# Patient Record
Sex: Female | Born: 1950 | Race: White | Hispanic: No | Marital: Married | Smoking: Former smoker
Health system: Southern US, Community
[De-identification: ages and names within clinical notes are randomized; demographics above are authoritative.]

## PROBLEM LIST (undated history)

## (undated) DIAGNOSIS — L299 Pruritus, unspecified: Secondary | ICD-10-CM

## (undated) DIAGNOSIS — T7840XA Allergy, unspecified, initial encounter: Secondary | ICD-10-CM

## (undated) DIAGNOSIS — R7611 Nonspecific reaction to tuberculin skin test without active tuberculosis: Secondary | ICD-10-CM

## (undated) DIAGNOSIS — K635 Polyp of colon: Secondary | ICD-10-CM

## (undated) DIAGNOSIS — K5900 Constipation, unspecified: Secondary | ICD-10-CM

## (undated) DIAGNOSIS — Z0000 Encounter for general adult medical examination without abnormal findings: Secondary | ICD-10-CM

## (undated) DIAGNOSIS — J449 Chronic obstructive pulmonary disease, unspecified: Secondary | ICD-10-CM

## (undated) DIAGNOSIS — A498 Other bacterial infections of unspecified site: Secondary | ICD-10-CM

## (undated) DIAGNOSIS — M8580 Other specified disorders of bone density and structure, unspecified site: Secondary | ICD-10-CM

## (undated) HISTORY — PX: DILATION AND CURETTAGE OF UTERUS: SHX78

## (undated) HISTORY — DX: Pruritus, unspecified: L29.9

## (undated) HISTORY — DX: Allergy, unspecified, initial encounter: T78.40XA

## (undated) HISTORY — DX: Chronic obstructive pulmonary disease, unspecified: J44.9

## (undated) HISTORY — DX: Encounter for general adult medical examination without abnormal findings: Z00.00

## (undated) HISTORY — DX: Constipation, unspecified: K59.00

## (undated) HISTORY — DX: Other specified disorders of bone density and structure, unspecified site: M85.80

## (undated) HISTORY — PX: COLONOSCOPY: SHX174

## (undated) HISTORY — PX: ABDOMINAL HYSTERECTOMY: SHX81

## (undated) HISTORY — PX: TUBAL LIGATION: SHX77

## (undated) HISTORY — DX: Polyp of colon: K63.5

## (undated) HISTORY — PX: APPENDECTOMY: SHX54

## (undated) HISTORY — DX: Nonspecific reaction to tuberculin skin test without active tuberculosis: R76.11

---
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
## 2000-01-12 ENCOUNTER — Other Ambulatory Visit: Admission: RE | Admit: 2000-01-12 | Discharge: 2000-01-12 | Payer: Self-pay | Admitting: Family Medicine

## 2000-03-30 ENCOUNTER — Emergency Department (HOSPITAL_COMMUNITY): Admission: EM | Admit: 2000-03-30 | Discharge: 2000-03-30 | Payer: Self-pay

## 2004-01-15 ENCOUNTER — Ambulatory Visit: Payer: Self-pay | Admitting: Family Medicine

## 2004-05-06 ENCOUNTER — Ambulatory Visit: Payer: Self-pay | Admitting: Family Medicine

## 2004-05-27 ENCOUNTER — Ambulatory Visit: Payer: Self-pay

## 2004-06-04 ENCOUNTER — Ambulatory Visit: Payer: Self-pay | Admitting: Family Medicine

## 2005-01-05 ENCOUNTER — Ambulatory Visit: Payer: Self-pay | Admitting: Internal Medicine

## 2005-01-14 ENCOUNTER — Ambulatory Visit: Payer: Self-pay | Admitting: Internal Medicine

## 2005-08-27 ENCOUNTER — Encounter: Admission: RE | Admit: 2005-08-27 | Discharge: 2005-08-27 | Payer: Self-pay | Admitting: Occupational Medicine

## 2005-08-27 ENCOUNTER — Other Ambulatory Visit: Admission: RE | Admit: 2005-08-27 | Discharge: 2005-08-27 | Payer: Self-pay | Admitting: Internal Medicine

## 2008-06-10 ENCOUNTER — Ambulatory Visit: Payer: Self-pay | Admitting: Internal Medicine

## 2008-06-10 ENCOUNTER — Other Ambulatory Visit: Admission: RE | Admit: 2008-06-10 | Discharge: 2008-06-10 | Payer: Self-pay | Admitting: Internal Medicine

## 2008-06-10 LAB — HM PAP SMEAR

## 2008-07-11 ENCOUNTER — Ambulatory Visit: Payer: Self-pay | Admitting: Internal Medicine

## 2009-06-04 ENCOUNTER — Encounter: Admission: RE | Admit: 2009-06-04 | Discharge: 2009-06-04 | Payer: Self-pay | Admitting: Occupational Medicine

## 2010-01-27 ENCOUNTER — Ambulatory Visit: Payer: Self-pay | Admitting: Internal Medicine

## 2010-01-30 ENCOUNTER — Encounter: Admission: RE | Admit: 2010-01-30 | Discharge: 2010-01-30 | Payer: Self-pay | Admitting: Internal Medicine

## 2010-03-04 LAB — HM MAMMOGRAPHY

## 2010-07-20 ENCOUNTER — Telehealth: Payer: Self-pay

## 2010-07-20 NOTE — Telephone Encounter (Signed)
Patient informed, and will call ortho

## 2010-07-20 NOTE — Telephone Encounter (Signed)
This is an orth problem. She should call ortho office of choice or [facility] [phone]

## 2010-07-20 NOTE — Telephone Encounter (Signed)
Patient would like to be seen today, if possible. Has pain near left achilles tendon since Saturday. No swelling or redness that she can see

## 2010-12-17 ENCOUNTER — Encounter: Payer: Self-pay | Admitting: Internal Medicine

## 2010-12-18 ENCOUNTER — Ambulatory Visit: Payer: Self-pay | Admitting: Internal Medicine

## 2011-01-07 ENCOUNTER — Ambulatory Visit (INDEPENDENT_AMBULATORY_CARE_PROVIDER_SITE_OTHER): Payer: BC Managed Care – PPO | Admitting: Internal Medicine

## 2011-01-07 DIAGNOSIS — Z23 Encounter for immunization: Secondary | ICD-10-CM

## 2011-02-04 ENCOUNTER — Other Ambulatory Visit: Payer: BC Managed Care – PPO | Admitting: Internal Medicine

## 2011-02-04 DIAGNOSIS — Z0000 Encounter for general adult medical examination without abnormal findings: Secondary | ICD-10-CM

## 2011-02-05 ENCOUNTER — Other Ambulatory Visit: Payer: BC Managed Care – PPO | Admitting: Internal Medicine

## 2011-02-05 LAB — CBC WITH DIFFERENTIAL/PLATELET
Basophils Absolute: 0.1 10*3/uL (ref 0.0–0.1)
Basophils Relative: 1 % (ref 0–1)
Eosinophils Absolute: 0.8 10*3/uL — ABNORMAL HIGH (ref 0.0–0.7)
Eosinophils Relative: 7 % — ABNORMAL HIGH (ref 0–5)
HCT: 39.5 % (ref 36.0–46.0)
Hemoglobin: 12.2 g/dL (ref 12.0–15.0)
Lymphocytes Relative: 18 % (ref 12–46)
Lymphs Abs: 1.9 10*3/uL (ref 0.7–4.0)
MCH: 24.6 pg — ABNORMAL LOW (ref 26.0–34.0)
MCHC: 30.9 g/dL (ref 30.0–36.0)
MCV: 79.8 fL (ref 78.0–100.0)
Monocytes Absolute: 0.8 10*3/uL (ref 0.1–1.0)
Monocytes Relative: 8 % (ref 3–12)
Neutro Abs: 7.1 10*3/uL (ref 1.7–7.7)
Neutrophils Relative %: 67 % (ref 43–77)
Platelets: 279 10*3/uL (ref 150–400)
RBC: 4.95 MIL/uL (ref 3.87–5.11)
RDW: 18.9 % — ABNORMAL HIGH (ref 11.5–15.5)
WBC: 10.6 10*3/uL — ABNORMAL HIGH (ref 4.0–10.5)

## 2011-02-05 LAB — LIPID PANEL
Cholesterol: 216 mg/dL — ABNORMAL HIGH (ref 0–200)
HDL: 67 mg/dL (ref >39)
LDL Cholesterol: 130 mg/dL — ABNORMAL HIGH (ref 0–99)
Total CHOL/HDL Ratio: 3.2 Ratio
Triglycerides: 95 mg/dL (ref <150)
VLDL: 19 mg/dL (ref 0–40)

## 2011-02-05 LAB — COMPREHENSIVE METABOLIC PANEL
ALT: 16 U/L (ref 0–35)
AST: 18 U/L (ref 0–37)
Albumin: 4.2 g/dL (ref 3.5–5.2)
Alkaline Phosphatase: 90 U/L (ref 39–117)
BUN: 14 mg/dL (ref 6–23)
CO2: 28 mEq/L (ref 19–32)
Calcium: 9.3 mg/dL (ref 8.4–10.5)
Chloride: 103 mEq/L (ref 96–112)
Creat: 0.86 mg/dL (ref 0.50–1.10)
Glucose, Bld: 131 mg/dL — ABNORMAL HIGH (ref 70–99)
Potassium: 4.1 mEq/L (ref 3.5–5.3)
Sodium: 138 mEq/L (ref 135–145)
Total Bilirubin: 0.4 mg/dL (ref 0.3–1.2)
Total Protein: 6.9 g/dL (ref 6.0–8.3)

## 2011-02-05 LAB — VITAMIN D 25 HYDROXY (VIT D DEFICIENCY, FRACTURES): Vit D, 25-Hydroxy: 18 ng/mL — ABNORMAL LOW (ref 30–89)

## 2011-02-05 LAB — TSH: TSH: 1.82 u[IU]/mL (ref 0.350–4.500)

## 2011-02-08 ENCOUNTER — Encounter: Payer: Self-pay | Admitting: Internal Medicine

## 2011-02-08 ENCOUNTER — Ambulatory Visit (INDEPENDENT_AMBULATORY_CARE_PROVIDER_SITE_OTHER): Payer: BC Managed Care – PPO | Admitting: Internal Medicine

## 2011-02-08 VITALS — BP 100/68 | HR 88 | Temp 97.5°F | Ht 61.0 in | Wt 114.0 lb

## 2011-02-08 DIAGNOSIS — Z87891 Personal history of nicotine dependence: Secondary | ICD-10-CM

## 2011-02-08 DIAGNOSIS — Z0000 Encounter for general adult medical examination without abnormal findings: Secondary | ICD-10-CM

## 2011-02-08 DIAGNOSIS — K635 Polyp of colon: Secondary | ICD-10-CM

## 2011-02-08 DIAGNOSIS — M7918 Myalgia, other site: Secondary | ICD-10-CM

## 2011-02-08 DIAGNOSIS — M8580 Other specified disorders of bone density and structure, unspecified site: Secondary | ICD-10-CM

## 2011-02-08 LAB — POCT URINALYSIS DIPSTICK
Bilirubin, UA: NEGATIVE
Blood, UA: NEGATIVE
Glucose, UA: NEGATIVE
Ketones, UA: NEGATIVE
Leukocytes, UA: NEGATIVE
Nitrite, UA: NEGATIVE
Protein, UA: NEGATIVE
Spec Grav, UA: 1.01
Urobilinogen, UA: NEGATIVE
pH, UA: 6

## 2011-02-11 ENCOUNTER — Other Ambulatory Visit: Payer: Self-pay | Admitting: Internal Medicine

## 2011-02-11 NOTE — Telephone Encounter (Signed)
Call drugstore and refill for 0ne year.

## 2011-02-15 ENCOUNTER — Other Ambulatory Visit: Payer: Self-pay

## 2011-02-15 MED ORDER — KETOPROFEN 75 MG PO CAPS: 75.0000 mg | ORAL_CAPSULE | Freq: Two times a day (BID) | ORAL | Status: DC

## 2011-03-01 ENCOUNTER — Encounter: Payer: Self-pay | Admitting: Internal Medicine

## 2011-03-01 DIAGNOSIS — M7918 Myalgia, other site: Secondary | ICD-10-CM | POA: Insufficient documentation

## 2011-03-01 DIAGNOSIS — K635 Polyp of colon: Secondary | ICD-10-CM | POA: Insufficient documentation

## 2011-03-01 DIAGNOSIS — M8580 Other specified disorders of bone density and structure, unspecified site: Secondary | ICD-10-CM | POA: Insufficient documentation

## 2011-03-01 DIAGNOSIS — Z87891 Personal history of nicotine dependence: Secondary | ICD-10-CM | POA: Insufficient documentation

## 2011-03-01 NOTE — Patient Instructions (Addendum)
Please consider stopping smoking. Return one year or as needed.

## 2011-04-25 ENCOUNTER — Encounter: Payer: Self-pay | Admitting: Internal Medicine

## 2011-04-25 NOTE — Progress Notes (Signed)
  Subjective:    Patient ID: Danielle Fox, female    DOB: 16-Sep-1950, 61 y.o.   MRN: [id number]  HPI 61 year old white female with history of hyperplastic colon polyps and osteopenia with history of smoking or health maintenance exam. Patient had a fractured left ankle April 2011, left hand laceration from a knife 2003. Had mammogram January 2012. No known drug allergies. Had D&C twice once after her first son was born and after her second child was born. Also had left ruptured ovarian cyst and appendectomy 1973. Hysterectomy 1999. Tetanus immunization last done 2003. Bone density study done May 2010. Colonoscopy by Dr. Leone Payor November 2006. Negative Cardiolite study March 2006. Last Pap smear 2010. Patient took estrogen replacement in the form of Climara patch from 2005 until recently. Patient works as a Pharmacologist. She divorced in 1996. Has smoked a pack of cigarettes daily for proximally 40 years but quit for about 3 years. Social alcohol consumption consisting of beer. Resides with female friend Faye Ramsay.  Family history both parents had heart attacks but father died of complications of psoriasis at age 87.  One brother and 4 sisters in good health. 2 sons living. One son died at age 56 in [place] in a motor vehicle accident in 29.    Review of Systems  Constitutional: Negative.   HENT: Negative.   Eyes: Negative.   Respiratory: Negative.   Cardiovascular: Negative.   Gastrointestinal: Negative.   Genitourinary: Negative.   Musculoskeletal: Negative.   Neurological: Negative.   Hematological: Negative.   Psychiatric/Behavioral: Negative.        Objective:   Physical Exam  Nursing note and vitals reviewed. Constitutional: She is oriented to person, place, and time. She appears well-nourished.  HENT:  Head: Normocephalic and atraumatic.  Right Ear: External ear normal.  Left Ear: External ear normal.  Mouth/Throat: Oropharynx is clear and moist.  Eyes:  Conjunctivae and EOM are normal. Right eye exhibits no discharge. Left eye exhibits no discharge. No scleral icterus.  Neck: Normal range of motion. Neck supple. No JVD present. No thyromegaly present.  Cardiovascular: Normal rate, regular rhythm, normal heart sounds and intact distal pulses.   No murmur heard. Pulmonary/Chest: Effort normal and breath sounds normal. No respiratory distress. She has no wheezes. She has no rales.       Breasts normal female without masses  Abdominal: Soft. Bowel sounds are normal. She exhibits no mass. There is no tenderness. There is no rebound.  Genitourinary:       Deferred last Pap 2010  Musculoskeletal: Normal range of motion. She exhibits no edema.  Lymphadenopathy:    She has no cervical adenopathy.  Neurological: She is alert and oriented to person, place, and time. She has normal reflexes. She displays normal reflexes. Coordination normal.  Skin: Skin is warm and dry. No rash noted. She is not diaphoretic.  Psychiatric: She has a normal mood and affect. Her behavior is normal. Judgment and thought content normal.          Assessment & Plan:  History of smoking-chest x-ray done 01/30/2010 negative except for mild hyperinflation.  History of hyperplastic colon polyps  History of osteopenia took Boniva short-term around 2007. Last bone density study 2010 showed T score LS-spine -1.2 at L1. Right femoral neck -1.4.  Plan: Continue calcium and vitamin D supplementation. Patient takes  Orudis 75 mg twice daily as needed for minor pain. Return one year or as needed.

## 2011-06-21 ENCOUNTER — Telehealth: Payer: Self-pay

## 2011-06-21 NOTE — Telephone Encounter (Signed)
Phoned in Phenergan 25 mg tablets 1 q8h prn nausea #30 with no refills to [phone] per Dr. Lenord Fellers.  Pt aware.

## 2011-08-12 ENCOUNTER — Other Ambulatory Visit: Payer: BC Managed Care – PPO | Admitting: Internal Medicine

## 2011-08-12 DIAGNOSIS — E785 Hyperlipidemia, unspecified: Secondary | ICD-10-CM

## 2011-08-12 LAB — LIPID PANEL
Cholesterol: 194 mg/dL (ref 0–200)
HDL: 61 mg/dL (ref >39)
LDL Cholesterol: 116 mg/dL — ABNORMAL HIGH (ref 0–99)
Total CHOL/HDL Ratio: 3.2 Ratio
Triglycerides: 85 mg/dL (ref <150)
VLDL: 17 mg/dL (ref 0–40)

## 2011-08-13 ENCOUNTER — Encounter: Payer: Self-pay | Admitting: Internal Medicine

## 2011-08-13 ENCOUNTER — Ambulatory Visit (INDEPENDENT_AMBULATORY_CARE_PROVIDER_SITE_OTHER): Payer: BC Managed Care – PPO | Admitting: Internal Medicine

## 2011-08-13 VITALS — BP 102/74 | HR 92 | Temp 97.4°F | Ht 61.0 in | Wt 116.0 lb

## 2011-08-13 DIAGNOSIS — M899 Disorder of bone, unspecified: Secondary | ICD-10-CM

## 2011-08-13 DIAGNOSIS — Z8639 Personal history of other endocrine, nutritional and metabolic disease: Secondary | ICD-10-CM

## 2011-08-13 DIAGNOSIS — M8580 Other specified disorders of bone density and structure, unspecified site: Secondary | ICD-10-CM

## 2011-08-13 DIAGNOSIS — Z87891 Personal history of nicotine dependence: Secondary | ICD-10-CM

## 2011-08-13 DIAGNOSIS — M949 Disorder of cartilage, unspecified: Secondary | ICD-10-CM

## 2011-08-13 DIAGNOSIS — Z87898 Personal history of other specified conditions: Secondary | ICD-10-CM

## 2011-08-13 DIAGNOSIS — E785 Hyperlipidemia, unspecified: Secondary | ICD-10-CM

## 2011-08-15 ENCOUNTER — Encounter: Payer: Self-pay | Admitting: Internal Medicine

## 2011-08-15 DIAGNOSIS — E785 Hyperlipidemia, unspecified: Secondary | ICD-10-CM | POA: Insufficient documentation

## 2011-08-15 DIAGNOSIS — Z8639 Personal history of other endocrine, nutritional and metabolic disease: Secondary | ICD-10-CM | POA: Insufficient documentation

## 2011-08-15 NOTE — Progress Notes (Signed)
  Subjective:    Patient ID: Danielle Fox, female    DOB: 10-07-1950, 61 y.o.   MRN: [id number]  HPI 61 year old white female with history of osteopenia, history of smoking, history of hyperplastic colon polyp, history of musculoskeletal pain treated with ketoprofen with followup today on hyperlipidemia noted on physical exam 6 months ago. At that time total cholesterol was 216 with an LDL cholesterol of 1:30 which was higher than it had been previously. Her previous lipid panel in November 2011 had shown a total cholesterol of 188 with LDL cholesterol of 106. Her thyroid functions were normal. She also has a history of mild vitamin D deficiency. Today total cholesterol is 194 with an LDL cholesterol of 116. Triglycerides are normal. She continues to smoke. Is smoking less than a pack of cigarettes per day. Not ready to quit. Prescription given for Zostavax vaccine to be done at drug store. Order given to her to have mammogram. Last mammogram on file January 2012. Last bone density study May 2010 says that should be repeated as well. Says she is eating too much cheese. Explained which  foods that could increase her lipid results    Review of Systems     Objective:   Physical Exam neck supple without JVD thyromegaly or carotid bruits; chest clear to auscultation; cardiac exam regular rate and rhythm; extremities without edema. Patient is alert and oriented x3.        Assessment & Plan:  History of smoking  Hyperlipidemia  Osteopenia  History of vitamin D deficiency  Plan: Patient given order to have mammogram and bone density studies; return in 6 months for physical examination. Continue diet exercise to treat hyperlipidemia. Says she is eating too much cheese. Please give consideration to stopping smoking.

## 2011-08-15 NOTE — Patient Instructions (Addendum)
Patient is not ready to quit smoking. Should obtain bone density study and mammogram in the near future. Return in 6 months for physical examination. Continue to control lipids with diet and exercise. Stop eating lots of cheese.

## 2012-02-10 ENCOUNTER — Other Ambulatory Visit: Payer: BC Managed Care – PPO | Admitting: Internal Medicine

## 2012-02-11 ENCOUNTER — Encounter: Payer: BC Managed Care – PPO | Admitting: Internal Medicine

## 2012-02-15 ENCOUNTER — Telehealth: Payer: Self-pay | Admitting: Internal Medicine

## 2012-02-15 NOTE — Telephone Encounter (Signed)
No records in paper chart about any orthopedic MD she thinks she's been to. Given phone number for [facility]

## 2012-02-15 NOTE — Telephone Encounter (Signed)
Pt wanted to know what was the name of the orthopedic doctor Dr. Lenord Fellers referred her to about five years ago. She would like to see them.

## 2012-02-15 NOTE — Telephone Encounter (Signed)
Please pull chart and see if we have records from previous ortho visit.

## 2012-03-14 ENCOUNTER — Other Ambulatory Visit: Payer: Self-pay | Admitting: Internal Medicine

## 2012-03-16 ENCOUNTER — Ambulatory Visit: Payer: BC Managed Care – PPO | Admitting: Rehabilitation

## 2012-04-10 ENCOUNTER — Other Ambulatory Visit: Payer: PRIVATE HEALTH INSURANCE | Admitting: Internal Medicine

## 2012-04-10 DIAGNOSIS — Z0000 Encounter for general adult medical examination without abnormal findings: Secondary | ICD-10-CM

## 2012-04-10 LAB — COMPREHENSIVE METABOLIC PANEL
ALT: 16 U/L (ref 0–35)
AST: 18 U/L (ref 0–37)
Albumin: 4.3 g/dL (ref 3.5–5.2)
Alkaline Phosphatase: 90 U/L (ref 39–117)
BUN: 11 mg/dL (ref 6–23)
CO2: 27 mEq/L (ref 19–32)
Calcium: 9.8 mg/dL (ref 8.4–10.5)
Chloride: 104 mEq/L (ref 96–112)
Creat: 0.91 mg/dL (ref 0.50–1.10)
Glucose, Bld: 89 mg/dL (ref 70–99)
Potassium: 4.3 mEq/L (ref 3.5–5.3)
Sodium: 140 mEq/L (ref 135–145)
Total Bilirubin: 0.8 mg/dL (ref 0.3–1.2)
Total Protein: 7 g/dL (ref 6.0–8.3)

## 2012-04-10 LAB — LIPID PANEL
Cholesterol: 222 mg/dL — ABNORMAL HIGH (ref 0–200)
HDL: 70 mg/dL (ref >39)
LDL Cholesterol: 134 mg/dL — ABNORMAL HIGH (ref 0–99)
Total CHOL/HDL Ratio: 3.2 Ratio
Triglycerides: 91 mg/dL (ref <150)
VLDL: 18 mg/dL (ref 0–40)

## 2012-04-10 LAB — CBC WITH DIFFERENTIAL/PLATELET
Basophils Absolute: 0.1 10*3/uL (ref 0.0–0.1)
Basophils Relative: 1 % (ref 0–1)
Eosinophils Absolute: 0.7 10*3/uL (ref 0.0–0.7)
Eosinophils Relative: 9 % — ABNORMAL HIGH (ref 0–5)
HCT: 42.8 % (ref 36.0–46.0)
Hemoglobin: 14.3 g/dL (ref 12.0–15.0)
Lymphocytes Relative: 33 % (ref 12–46)
Lymphs Abs: 2.5 10*3/uL (ref 0.7–4.0)
MCH: 28.2 pg (ref 26.0–34.0)
MCHC: 33.4 g/dL (ref 30.0–36.0)
MCV: 84.4 fL (ref 78.0–100.0)
Monocytes Absolute: 1 10*3/uL (ref 0.1–1.0)
Monocytes Relative: 13 % — ABNORMAL HIGH (ref 3–12)
Neutro Abs: 3.5 10*3/uL (ref 1.7–7.7)
Neutrophils Relative %: 44 % (ref 43–77)
Platelets: 270 10*3/uL (ref 150–400)
RBC: 5.07 MIL/uL (ref 3.87–5.11)
RDW: 15.2 % (ref 11.5–15.5)
WBC: 7.7 10*3/uL (ref 4.0–10.5)

## 2012-04-10 LAB — TSH: TSH: 2.561 u[IU]/mL (ref 0.350–4.500)

## 2012-04-11 ENCOUNTER — Other Ambulatory Visit: Payer: BC Managed Care – PPO | Admitting: Internal Medicine

## 2012-04-11 LAB — VITAMIN D 25 HYDROXY (VIT D DEFICIENCY, FRACTURES): Vit D, 25-Hydroxy: 11 ng/mL — ABNORMAL LOW (ref 30–89)

## 2012-04-13 ENCOUNTER — Encounter: Payer: BC Managed Care – PPO | Admitting: Internal Medicine

## 2012-05-16 ENCOUNTER — Other Ambulatory Visit: Payer: Self-pay

## 2012-05-16 MED ORDER — KETOPROFEN 75 MG PO CAPS: 75.0000 mg | ORAL_CAPSULE | Freq: Two times a day (BID) | ORAL | Status: DC | PRN

## 2012-07-11 ENCOUNTER — Encounter: Payer: PRIVATE HEALTH INSURANCE | Admitting: Internal Medicine

## 2012-08-24 ENCOUNTER — Encounter: Payer: Self-pay | Admitting: Internal Medicine

## 2012-09-18 ENCOUNTER — Encounter: Payer: PRIVATE HEALTH INSURANCE | Admitting: Internal Medicine

## 2012-12-12 ENCOUNTER — Telehealth: Payer: Self-pay

## 2012-12-12 NOTE — Telephone Encounter (Addendum)
New Patient - Needs updates Unable to reach prior to visit

## 2012-12-14 ENCOUNTER — Encounter: Payer: Self-pay | Admitting: Family Medicine

## 2012-12-14 ENCOUNTER — Ambulatory Visit (INDEPENDENT_AMBULATORY_CARE_PROVIDER_SITE_OTHER): Payer: PRIVATE HEALTH INSURANCE | Admitting: Family Medicine

## 2012-12-14 VITALS — BP 102/66 | HR 76 | Temp 98.1°F | Ht 62.0 in | Wt 118.4 lb

## 2012-12-14 DIAGNOSIS — L259 Unspecified contact dermatitis, unspecified cause: Secondary | ICD-10-CM

## 2012-12-14 DIAGNOSIS — E785 Hyperlipidemia, unspecified: Secondary | ICD-10-CM

## 2012-12-14 DIAGNOSIS — Z1239 Encounter for other screening for malignant neoplasm of breast: Secondary | ICD-10-CM

## 2012-12-14 DIAGNOSIS — M8580 Other specified disorders of bone density and structure, unspecified site: Secondary | ICD-10-CM

## 2012-12-14 DIAGNOSIS — Z0000 Encounter for general adult medical examination without abnormal findings: Secondary | ICD-10-CM

## 2012-12-14 DIAGNOSIS — Z23 Encounter for immunization: Secondary | ICD-10-CM

## 2012-12-14 DIAGNOSIS — E2839 Other primary ovarian failure: Secondary | ICD-10-CM

## 2012-12-14 DIAGNOSIS — E559 Vitamin D deficiency, unspecified: Secondary | ICD-10-CM

## 2012-12-14 DIAGNOSIS — Z8639 Personal history of other endocrine, nutritional and metabolic disease: Secondary | ICD-10-CM

## 2012-12-14 DIAGNOSIS — N390 Urinary tract infection, site not specified: Secondary | ICD-10-CM

## 2012-12-14 DIAGNOSIS — Z87891 Personal history of nicotine dependence: Secondary | ICD-10-CM

## 2012-12-14 DIAGNOSIS — M899 Disorder of bone, unspecified: Secondary | ICD-10-CM

## 2012-12-14 LAB — LIPID PANEL
Cholesterol: 246 mg/dL — ABNORMAL HIGH (ref 0–200)
HDL: 65.2 mg/dL (ref >39.00)
Total CHOL/HDL Ratio: 4
Triglycerides: 125 mg/dL (ref 0.0–149.0)
VLDL: 25 mg/dL (ref 0.0–40.0)

## 2012-12-14 LAB — POCT URINALYSIS DIPSTICK
Bilirubin, UA: NEGATIVE
Blood, UA: NEGATIVE
Glucose, UA: NEGATIVE
Ketones, UA: NEGATIVE
Nitrite, UA: NEGATIVE
Protein, UA: NEGATIVE
Spec Grav, UA: <=1.005
Urobilinogen, UA: 0.2
pH, UA: 6.5

## 2012-12-14 LAB — BASIC METABOLIC PANEL
BUN: 11 mg/dL (ref 6–23)
CO2: 29 mEq/L (ref 19–32)
Calcium: 9.9 mg/dL (ref 8.4–10.5)
Chloride: 101 mEq/L (ref 96–112)
Creatinine, Ser: 0.9 mg/dL (ref 0.4–1.2)
GFR: 68.21 mL/min (ref >60.00)
Glucose, Bld: 84 mg/dL (ref 70–99)
Potassium: 3.7 mEq/L (ref 3.5–5.1)
Sodium: 140 mEq/L (ref 135–145)

## 2012-12-14 LAB — HEPATIC FUNCTION PANEL
ALT: 22 U/L (ref 0–35)
AST: 27 U/L (ref 0–37)
Albumin: 4.4 g/dL (ref 3.5–5.2)
Alkaline Phosphatase: 86 U/L (ref 39–117)
Bilirubin, Direct: 0 mg/dL (ref 0.0–0.3)
Total Bilirubin: 0.8 mg/dL (ref 0.3–1.2)
Total Protein: 7.9 g/dL (ref 6.0–8.3)

## 2012-12-14 LAB — TSH: TSH: 1.38 u[IU]/mL (ref 0.35–5.50)

## 2012-12-14 LAB — CBC WITH DIFFERENTIAL/PLATELET
Basophils Absolute: 0.1 10*3/uL (ref 0.0–0.1)
Basophils Relative: 0.9 % (ref 0.0–3.0)
Eosinophils Absolute: 0.9 10*3/uL — ABNORMAL HIGH (ref 0.0–0.7)
Eosinophils Relative: 11.1 % — ABNORMAL HIGH (ref 0.0–5.0)
HCT: 43 % (ref 36.0–46.0)
Hemoglobin: 14.3 g/dL (ref 12.0–15.0)
Lymphocytes Relative: 23.7 % (ref 12.0–46.0)
Lymphs Abs: 2 10*3/uL (ref 0.7–4.0)
MCHC: 33.4 g/dL (ref 30.0–36.0)
MCV: 89.4 fl (ref 78.0–100.0)
Monocytes Absolute: 0.6 10*3/uL (ref 0.1–1.0)
Monocytes Relative: 7.7 % (ref 3.0–12.0)
Neutro Abs: 4.8 10*3/uL (ref 1.4–7.7)
Neutrophils Relative %: 56.6 % (ref 43.0–77.0)
Platelets: 233 10*3/uL (ref 150.0–400.0)
RBC: 4.8 Mil/uL (ref 3.87–5.11)
RDW: 14.2 % (ref 11.5–14.6)
WBC: 8.4 10*3/uL (ref 4.5–10.5)

## 2012-12-14 LAB — LDL CHOLESTEROL, DIRECT: Direct LDL: 155.9 mg/dL

## 2012-12-14 MED ORDER — KETOPROFEN 75 MG PO CAPS: 75.0000 mg | ORAL_CAPSULE | Freq: Two times a day (BID) | ORAL | Status: DC | PRN

## 2012-12-14 MED ORDER — MOMETASONE FUROATE 0.1 % EX CREA: TOPICAL_CREAM | Freq: Every day | CUTANEOUS | Status: DC

## 2012-12-14 NOTE — Addendum Note (Signed)
Addended by: Silvio Pate D on: 12/14/2012 04:49 PM   Modules accepted: Orders

## 2012-12-14 NOTE — Progress Notes (Signed)
Subjective:     Christian ZILDA NO is a 62 y.o. female and is here for a comprehensive physical exam. The patient reports no problems.  History   Social History  . Marital Status: Single    Spouse Name: N/A    Number of Children: N/A  . 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 Smoking status: Current Every Day Smoker -- 0.50 packs/day for 40 years    Types: Cigarettes  . Smokeless tobacco: Never Used  . Alcohol Use: Yes     Comment: socially  . Drug Use: No  . Sexual Activity: Yes    Partners: Male   Other Topics Concern  . Not on file   Social History Narrative   Exercise-- walks dog on occassion   Health Maintenance  Topic Date Due  . Zostavax  07/07/2010  . Pap Smear  06/11/2011  . Mammogram  03/04/2012  . Influenza Vaccine  09/29/2012  . Tetanus/tdap  09/16/2013  . Colonoscopy  08/14/2021    The following portions of the patient's history were reviewed and updated as appropriate:  She  has a past medical history of Osteopenia; Colon polyps; and Positive TB test. She  does not have any pertinent problems on file. She  has past surgical history that includes Appendectomy and Abdominal hysterectomy. Her family history includes Alcohol abuse in her father; Cirrhosis in her father; Heart disease in her father and mother. She  reports that she has been smoking Cigarettes.  She has a 20 pack-year smoking history. She has never used smokeless tobacco. She reports that she drinks alcohol. She reports that she does not use illicit drugs. She has a current medication list which includes the following prescription(s): ketoprofen and mometasone. No current outpatient prescriptions on file prior to visit.   No current facility-administered medications on file prior to visit.   She has No Known Allergies..  Review of Systems Review of Systems  Constitutional: Negative for activity change, appetite change and  fatigue.  HENT: Negative for hearing loss, congestion, tinnitus and ear discharge.  dentist q80m Eyes: Negative for visual disturbance (see optho q1y -- vision corrected to 20/20 with glasses).  Respiratory: Negative for cough, chest tightness and shortness of breath.   Cardiovascular: Negative for chest pain, palpitations and leg swelling.  Gastrointestinal: Negative for abdominal pain, diarrhea, constipation and abdominal distention.  Genitourinary: Negative for urgency, frequency, decreased urine volume and difficulty urinating.  Musculoskeletal: Negative for back pain, arthralgias and gait problem.  Skin: Negative for color change, pallor and rash.  Neurological: Negative for dizziness, light-headedness, numbness and headaches.  Hematological: Negative for adenopathy. Does not bruise/bleed easily.  Psychiatric/Behavioral: Negative for suicidal ideas, confusion, sleep disturbance, self-injury, dysphoric mood, decreased concentration and agitation.       Objective:    BP 102/66  Pulse 76  Temp(Src) 98.1 F (36.7 C) (Oral)  Ht 5\' 2"  (1.575 m)  Wt 118 lb 6.4 oz (53.706 kg)  BMI 21.65 kg/m2  SpO2 96% General appearance: alert, cooperative, appears stated age and no distress Head: Normocephalic, without obvious abnormality, atraumatic Eyes: conjunctivae/corneas clear. PERRL, EOM's intact. Fundi benign. Ears: normal TM's and external ear canals both ears Nose: Nares normal. Septum midline. Mucosa normal. No drainage or sinus tenderness. Throat: lips, mucosa, and tongue normal; teeth and gums normal Neck: no adenopathy, no carotid bruit, no JVD, supple, symmetrical, trachea midline and thyroid not enlarged, symmetric, no tenderness/mass/nodules Back:  symmetric, no curvature. ROM normal. No CVA tenderness. Lungs: clear to auscultation bilaterally Breasts: normal appearance, no masses or tenderness Heart: regular rate and rhythm, S1, S2 normal, no murmur, click, rub or gallop Abdomen:  soft, non-tender; bowel sounds normal; no masses,  no organomegaly Pelvic: not indicated; post-menopausal, no abnormal Pap smears in past Extremities: extremities normal, atraumatic, no cyanosis or edema Pulses: 2+ and symmetric Skin: Skin color, texture, turgor normal. No rashes or lesions Lymph nodes: Cervical, supraclavicular, and axillary nodes normal. Neurologic: Alert and oriented X 3, normal strength and tone. Normal symmetric reflexes. Normal coordination and gait Psych-- no depression, no anxiety      Assessment:    Healthy female exam.      Plan:    ghm utd Check labs See After Visit Summary for Counseling Recommendations

## 2012-12-14 NOTE — Assessment & Plan Note (Signed)
Take vita D 3 2000u daily

## 2012-12-14 NOTE — Assessment & Plan Note (Signed)
Pt not interested in quitting yet

## 2012-12-14 NOTE — Patient Instructions (Signed)
Preventive Care for Adults, Female A healthy lifestyle and preventive care can promote health and wellness. Preventive health guidelines for women include the following key practices.  A routine yearly physical is a good way to check with your caregiver about your health and preventive screening. It is a chance to share any concerns and updates on your health, and to receive a thorough exam.  Visit your dentist for a routine exam and preventive care every 6 months. Brush your teeth twice a day and floss once a day. Good oral hygiene prevents tooth decay and gum disease.  The frequency of eye exams is based on your age, health, family medical history, use of contact lenses, and other factors. Follow your caregiver's recommendations for frequency of eye exams.  Eat a healthy diet. Foods like vegetables, fruits, whole grains, low-fat dairy products, and lean protein foods contain the nutrients you need without too many calories. Decrease your intake of foods high in solid fats, added sugars, and salt. Eat the right amount of calories for you.Get information about a proper diet from your caregiver, if necessary.  Regular physical exercise is one of the most important things you can do for your health. Most adults should get at least 150 minutes of moderate-intensity exercise (any activity that increases your heart rate and causes you to sweat) each week. In addition, most adults need muscle-strengthening exercises on 2 or more days a week.  Maintain a healthy weight. The body mass index (BMI) is a screening tool to identify possible weight problems. It provides an estimate of body fat based on height and weight. Your caregiver can help determine your BMI, and can help you achieve or maintain a healthy weight.For adults 20 years and older:  A BMI below 18.5 is considered underweight.  A BMI of 18.5 to 24.9 is normal.  A BMI of 25 to 29.9 is considered overweight.  A BMI of 30 and above is  considered obese.  Maintain normal blood lipids and cholesterol levels by exercising and minimizing your intake of saturated fat. Eat a balanced diet with plenty of fruit and vegetables. Blood tests for lipids and cholesterol should begin at age 20 and be repeated every 5 years. If your lipid or cholesterol levels are high, you are over 50, or you are at high risk for heart disease, you may need your cholesterol levels checked more frequently.Ongoing high lipid and cholesterol levels should be treated with medicines if diet and exercise are not effective.  If you smoke, find out from your caregiver how to quit. If you do not use tobacco, do not start.  If you are pregnant, do not drink alcohol. If you are breastfeeding, be very cautious about drinking alcohol. If you are not pregnant and choose to drink alcohol, do not exceed 1 drink per day. One drink is considered to be 12 ounces (355 mL) of beer, 5 ounces (148 mL) of wine, or 1.5 ounces (44 mL) of liquor.  Avoid use of street drugs. Do not share needles with anyone. Ask for help if you need support or instructions about stopping the use of drugs.  High blood pressure causes heart disease and increases the risk of stroke. Your blood pressure should be checked at least every 1 to 2 years. Ongoing high blood pressure should be treated with medicines if weight loss and exercise are not effective.  If you are 55 to 62 years old, ask your caregiver if you should take aspirin to prevent strokes.  Diabetes   screening involves taking a blood sample to check your fasting blood sugar level. This should be done once every 3 years, after age 45, if you are within normal weight and without risk factors for diabetes. Testing should be considered at a younger age or be carried out more frequently if you are overweight and have at least 1 risk factor for diabetes.  Breast cancer screening is essential preventive care for women. You should practice "breast  self-awareness." This means understanding the normal appearance and feel of your breasts and may include breast self-examination. Any changes detected, no matter how small, should be reported to a caregiver. Women in their 20s and 30s should have a clinical breast exam (CBE) by a caregiver as part of a regular health exam every 1 to 3 years. After age 40, women should have a CBE every year. Starting at age 40, women should consider having a mammography (breast X-ray test) every year. Women who have a family history of breast cancer should talk to their caregiver about genetic screening. Women at a high risk of breast cancer should talk to their caregivers about having magnetic resonance imaging (MRI) and a mammography every year.  The Pap test is a screening test for cervical cancer. A Pap test can show cell changes on the cervix that might become cervical cancer if left untreated. A Pap test is a procedure in which cells are obtained and examined from the lower end of the uterus (cervix).  Women should have a Pap test starting at age 21.  Between ages 21 and 29, Pap tests should be repeated every 2 years.  Beginning at age 30, you should have a Pap test every 3 years as long as the past 3 Pap tests have been normal.  Some women have medical problems that increase the chance of getting cervical cancer. Talk to your caregiver about these problems. It is especially important to talk to your caregiver if a new problem develops soon after your last Pap test. In these cases, your caregiver may recommend more frequent screening and Pap tests.  The above recommendations are the same for women who have or have not gotten the vaccine for human papillomavirus (HPV).  If you had a hysterectomy for a problem that was not cancer or a condition that could lead to cancer, then you no longer need Pap tests. Even if you no longer need a Pap test, a regular exam is a good idea to make sure no other problems are  starting.  If you are between ages 65 and 70, and you have had normal Pap tests going back 10 years, you no longer need Pap tests. Even if you no longer need a Pap test, a regular exam is a good idea to make sure no other problems are starting.  If you have had past treatment for cervical cancer or a condition that could lead to cancer, you need Pap tests and screening for cancer for at least 20 years after your treatment.  If Pap tests have been discontinued, risk factors (such as a new sexual partner) need to be reassessed to determine if screening should be resumed.  The HPV test is an additional test that may be used for cervical cancer screening. The HPV test looks for the virus that can cause the cell changes on the cervix. The cells collected during the Pap test can be tested for HPV. The HPV test could be used to screen women aged 30 years and older, and should   be used in women of any age who have unclear Pap test results. After the age of 30, women should have HPV testing at the same frequency as a Pap test.  Colorectal cancer can be detected and often prevented. Most routine colorectal cancer screening begins at the age of 50 and continues through age 75. However, your caregiver may recommend screening at an earlier age if you have risk factors for colon cancer. On a yearly basis, your caregiver may provide home test kits to check for hidden blood in the stool. Use of a small camera at the end of a tube, to directly examine the colon (sigmoidoscopy or colonoscopy), can detect the earliest forms of colorectal cancer. Talk to your caregiver about this at age 50, when routine screening begins. Direct examination of the colon should be repeated every 5 to 10 years through age 75, unless early forms of pre-cancerous polyps or small growths are found.  Hepatitis C blood testing is recommended for all people born from 1945 through 1965 and any individual with known risks for hepatitis C.  Practice  safe sex. Use condoms and avoid high-risk sexual practices to reduce the spread of sexually transmitted infections (STIs). STIs include gonorrhea, chlamydia, syphilis, trichomonas, herpes, HPV, and human immunodeficiency virus (HIV). Herpes, HIV, and HPV are viral illnesses that have no cure. They can result in disability, cancer, and death. Sexually active women aged 25 and younger should be checked for chlamydia. Older women with new or multiple partners should also be tested for chlamydia. Testing for other STIs is recommended if you are sexually active and at increased risk.  Osteoporosis is a disease in which the bones lose minerals and strength with aging. This can result in serious bone fractures. The risk of osteoporosis can be identified using a bone density scan. Women ages 65 and over and women at risk for fractures or osteoporosis should discuss screening with their caregivers. Ask your caregiver whether you should take a calcium supplement or vitamin D to reduce the rate of osteoporosis.  Menopause can be associated with physical symptoms and risks. Hormone replacement therapy is available to decrease symptoms and risks. You should talk to your caregiver about whether hormone replacement therapy is right for you.  Use sunscreen with sun protection factor (SPF) of 30 or more. Apply sunscreen liberally and repeatedly throughout the day. You should seek shade when your shadow is shorter than you. Protect yourself by wearing long sleeves, pants, a wide-brimmed hat, and sunglasses year round, whenever you are outdoors.  Once a month, do a whole body skin exam, using a mirror to look at the skin on your back. Notify your caregiver of new moles, moles that have irregular borders, moles that are larger than a pencil eraser, or moles that have changed in shape or color.  Stay current with required immunizations.  Influenza. You need a dose every fall (or winter). The composition of the flu vaccine  changes each year, so being vaccinated once is not enough.  Pneumococcal polysaccharide. You need 1 to 2 doses if you smoke cigarettes or if you have certain chronic medical conditions. You need 1 dose at age 65 (or older) if you have never been vaccinated.  Tetanus, diphtheria, pertussis (Tdap, Td). Get 1 dose of Tdap vaccine if you are younger than age 65, are over 65 and have contact with an infant, are a healthcare worker, are pregnant, or simply want to be protected from whooping cough. After that, you need a Td   booster dose every 10 years. Consult your caregiver if you have not had at least 3 tetanus and diphtheria-containing shots sometime in your life or have a deep or dirty wound.  HPV. You need this vaccine if you are a woman age 26 or younger. The vaccine is given in 3 doses over 6 months.  Measles, mumps, rubella (MMR). You need at least 1 dose of MMR if you were born in 1957 or later. You may also need a second dose.  Meningococcal. If you are age 19 to 21 and a first-year college student living in a residence hall, or have one of several medical conditions, you need to get vaccinated against meningococcal disease. You may also need additional booster doses.  Zoster (shingles). If you are age 60 or older, you should get this vaccine.  Varicella (chickenpox). If you have never had chickenpox or you were vaccinated but received only 1 dose, talk to your caregiver to find out if you need this vaccine.  Hepatitis A. You need this vaccine if you have a specific risk factor for hepatitis A virus infection or you simply wish to be protected from this disease. The vaccine is usually given as 2 doses, 6 to 18 months apart.  Hepatitis B. You need this vaccine if you have a specific risk factor for hepatitis B virus infection or you simply wish to be protected from this disease. The vaccine is given in 3 doses, usually over 6 months. Preventive Services / Frequency Ages 19 to 39  Blood  pressure check.** / Every 1 to 2 years.  Lipid and cholesterol check.** / Every 5 years beginning at age 20.  Clinical breast exam.** / Every 3 years for women in their 20s and 30s.  Pap test.** / Every 2 years from ages 21 through 29. Every 3 years starting at age 30 through age 65 or 70 with a history of 3 consecutive normal Pap tests.  HPV screening.** / Every 3 years from ages 30 through ages 65 to 70 with a history of 3 consecutive normal Pap tests.  Hepatitis C blood test.** / For any individual with known risks for hepatitis C.  Skin self-exam. / Monthly.  Influenza immunization.** / Every year.  Pneumococcal polysaccharide immunization.** / 1 to 2 doses if you smoke cigarettes or if you have certain chronic medical conditions.  Tetanus, diphtheria, pertussis (Tdap, Td) immunization. / A one-time dose of Tdap vaccine. After that, you need a Td booster dose every 10 years.  HPV immunization. / 3 doses over 6 months, if you are 26 and younger.  Measles, mumps, rubella (MMR) immunization. / You need at least 1 dose of MMR if you were born in 1957 or later. You may also need a second dose.  Meningococcal immunization. / 1 dose if you are age 19 to 21 and a first-year college student living in a residence hall, or have one of several medical conditions, you need to get vaccinated against meningococcal disease. You may also need additional booster doses.  Varicella immunization.** / Consult your caregiver.  Hepatitis A immunization.** / Consult your caregiver. 2 doses, 6 to 18 months apart.  Hepatitis B immunization.** / Consult your caregiver. 3 doses usually over 6 months. Ages 40 to 64  Blood pressure check.** / Every 1 to 2 years.  Lipid and cholesterol check.** / Every 5 years beginning at age 20.  Clinical breast exam.** / Every year after age 40.  Mammogram.** / Every year beginning at age 40   and continuing for as long as you are in good health. Consult with your  caregiver.  Pap test.** / Every 3 years starting at age 30 through age 65 or 70 with a history of 3 consecutive normal Pap tests.  HPV screening.** / Every 3 years from ages 30 through ages 65 to 70 with a history of 3 consecutive normal Pap tests.  Fecal occult blood test (FOBT) of stool. / Every year beginning at age 50 and continuing until age 75. You may not need to do this test if you get a colonoscopy every 10 years.  Flexible sigmoidoscopy or colonoscopy.** / Every 5 years for a flexible sigmoidoscopy or every 10 years for a colonoscopy beginning at age 50 and continuing until age 75.  Hepatitis C blood test.** / For all people born from 1945 through 1965 and any individual with known risks for hepatitis C.  Skin self-exam. / Monthly.  Influenza immunization.** / Every year.  Pneumococcal polysaccharide immunization.** / 1 to 2 doses if you smoke cigarettes or if you have certain chronic medical conditions.  Tetanus, diphtheria, pertussis (Tdap, Td) immunization.** / A one-time dose of Tdap vaccine. After that, you need a Td booster dose every 10 years.  Measles, mumps, rubella (MMR) immunization. / You need at least 1 dose of MMR if you were born in 1957 or later. You may also need a second dose.  Varicella immunization.** / Consult your caregiver.  Meningococcal immunization.** / Consult your caregiver.  Hepatitis A immunization.** / Consult your caregiver. 2 doses, 6 to 18 months apart.  Hepatitis B immunization.** / Consult your caregiver. 3 doses, usually over 6 months. Ages 65 and over  Blood pressure check.** / Every 1 to 2 years.  Lipid and cholesterol check.** / Every 5 years beginning at age 20.  Clinical breast exam.** / Every year after age 40.  Mammogram.** / Every year beginning at age 40 and continuing for as long as you are in good health. Consult with your caregiver.  Pap test.** / Every 3 years starting at age 30 through age 65 or 70 with a 3  consecutive normal Pap tests. Testing can be stopped between 65 and 70 with 3 consecutive normal Pap tests and no abnormal Pap or HPV tests in the past 10 years.  HPV screening.** / Every 3 years from ages 30 through ages 65 or 70 with a history of 3 consecutive normal Pap tests. Testing can be stopped between 65 and 70 with 3 consecutive normal Pap tests and no abnormal Pap or HPV tests in the past 10 years.  Fecal occult blood test (FOBT) of stool. / Every year beginning at age 50 and continuing until age 75. You may not need to do this test if you get a colonoscopy every 10 years.  Flexible sigmoidoscopy or colonoscopy.** / Every 5 years for a flexible sigmoidoscopy or every 10 years for a colonoscopy beginning at age 50 and continuing until age 75.  Hepatitis C blood test.** / For all people born from 1945 through 1965 and any individual with known risks for hepatitis C.  Osteoporosis screening.** / A one-time screening for women ages 65 and over and women at risk for fractures or osteoporosis.  Skin self-exam. / Monthly.  Influenza immunization.** / Every year.  Pneumococcal polysaccharide immunization.** / 1 dose at age 65 (or older) if you have never been vaccinated.  Tetanus, diphtheria, pertussis (Tdap, Td) immunization. / A one-time dose of Tdap vaccine if you are over   65 and have contact with an infant, are a healthcare worker, or simply want to be protected from whooping cough. After that, you need a Td booster dose every 10 years.  Varicella immunization.** / Consult your caregiver.  Meningococcal immunization.** / Consult your caregiver.  Hepatitis A immunization.** / Consult your caregiver. 2 doses, 6 to 18 months apart.  Hepatitis B immunization.** / Check with your caregiver. 3 doses, usually over 6 months. ** Family history and personal history of risk and conditions may change your caregiver's recommendations. Document Released: 04/13/2001 Document Revised: 05/10/2011  Document Reviewed: 07/13/2010 [employer] Patient Information 2014 [employer].  

## 2012-12-14 NOTE — Assessment & Plan Note (Signed)
Check labs 

## 2012-12-14 NOTE — Assessment & Plan Note (Signed)
Check bmd Ca 1200-1500 mg daily with vita d

## 2012-12-16 LAB — URINE CULTURE: Colony Count: 30000

## 2012-12-21 LAB — VITAMIN D 1,25 DIHYDROXY
Vitamin D 1, 25 (OH)2 Total: 29 pg/mL (ref 18–72)
Vitamin D2 1, 25 (OH)2: <8 pg/mL
Vitamin D3 1, 25 (OH)2: 29 pg/mL

## 2012-12-29 ENCOUNTER — Other Ambulatory Visit: Payer: Self-pay

## 2012-12-29 MED ORDER — VITAMIN D (ERGOCALCIFEROL) 1.25 MG (50000 UNIT) PO CAPS: 50000.0000 [IU] | ORAL_CAPSULE | ORAL | Status: DC

## 2013-01-08 LAB — HM DEXA SCAN

## 2013-01-08 LAB — HM MAMMOGRAPHY

## 2013-02-19 ENCOUNTER — Telehealth: Payer: Self-pay

## 2013-02-19 NOTE — Telephone Encounter (Signed)
BMD from 01/08/13 showed Osteopenia. Dr.Lowne recommends calcium 1200-1500 mg daily and Vitamin d 1000 units daily. If the patient is already doing that we can offer Fosamax 70 mg weekly. Recheck in 2 years. MSG left to call the office and copy sent to be scanned        KP

## 2013-03-13 MED ORDER — ALENDRONATE SODIUM 70 MG PO TABS: 70.0000 mg | ORAL_TABLET | ORAL | Status: DC

## 2013-03-13 NOTE — Telephone Encounter (Signed)
Letter mailed     KP 

## 2013-04-06 ENCOUNTER — Ambulatory Visit (INDEPENDENT_AMBULATORY_CARE_PROVIDER_SITE_OTHER): Payer: PRIVATE HEALTH INSURANCE | Admitting: Family Medicine

## 2013-04-06 ENCOUNTER — Encounter: Payer: Self-pay | Admitting: Family Medicine

## 2013-04-06 VITALS — BP 108/57 | HR 83 | Temp 98.3°F | Wt 118.0 lb

## 2013-04-06 DIAGNOSIS — J019 Acute sinusitis, unspecified: Secondary | ICD-10-CM

## 2013-04-06 DIAGNOSIS — J209 Acute bronchitis, unspecified: Secondary | ICD-10-CM

## 2013-04-06 MED ORDER — PREDNISONE 10 MG PO TABS: ORAL_TABLET | ORAL | Status: DC

## 2013-04-06 MED ORDER — GUAIFENESIN-CODEINE 100-10 MG/5ML PO SYRP: ORAL_SOLUTION | ORAL | Status: DC

## 2013-04-06 MED ORDER — CLARITHROMYCIN ER 500 MG PO TB24: 1000.0000 mg | ORAL_TABLET | Freq: Every day | ORAL | Status: AC

## 2013-04-06 MED ORDER — ALBUTEROL SULFATE HFA 108 (90 BASE) MCG/ACT IN AERS: 2.0000 | INHALATION_SPRAY | Freq: Four times a day (QID) | RESPIRATORY_TRACT | Status: DC | PRN

## 2013-04-06 MED ORDER — METHYLPREDNISOLONE ACETATE 80 MG/ML IJ SUSP
80.0000 mg | Freq: Once | INTRAMUSCULAR | Status: AC
  Administered 2013-04-06: 80 mg via INTRAMUSCULAR

## 2013-04-06 NOTE — Progress Notes (Signed)
Pre visit review using our clinic review tool, if applicable. No additional management support is needed unless otherwise documented below in the visit note. 

## 2013-04-06 NOTE — Progress Notes (Signed)
  Subjective:     Danielle Fox is a 63 y.o. female here for evaluation of a cough. Onset of symptoms was 4 days ago. Symptoms have been gradually worsening since that time. The cough is dry and productive and is aggravated by cold air, infection and reclining position. Associated symptoms include: chills, night sweats, postnasal drip, shortness of breath, sputum production and wheezing. Patient does not have a history of asthma. Patient does not have a history of environmental allergens. Patient has not traveled recently. Patient does have a history of smoking. Patient has not had a previous chest x-ray. Patient has not had a PPD done.  The following portions of the patient's history were reviewed and updated as appropriate: allergies, current medications, past family history, past medical history, past social history, past surgical history and problem list.  Review of Systems Pertinent items are noted in HPI.    Objective:    Oxygen saturation 95% on room air BP 108/57  Pulse 83  Temp(Src) 98.3 F (36.8 C) (Oral)  Wt 118 lb (53.524 kg)  SpO2 95% General appearance: alert, cooperative, appears stated age and no distress Ears: normal TM's and external ear canals both ears Nose: Nares normal. Septum midline. Mucosa normal. No drainage or sinus tenderness., green discharge, moderate congestion, turbinates red, swollen Throat: lips, mucosa, and tongue normal; teeth and gums normal Neck: no adenopathy, supple, symmetrical, trachea midline and thyroid not enlarged, symmetric, no tenderness/mass/nodules Lungs: wheezes bilaterally Heart: S1, S2 normal    Assessment:    Acute Bronchitis    Plan:    Antibiotics per medication orders. Antitussives per medication orders. Avoid exposure to tobacco smoke and fumes. B-agonist inhaler. Call if shortness of breath worsens, blood in sputum, change in character of cough, development of fever or chills, inability to maintain nutrition and  hydration. Avoid exposure to tobacco smoke and fumes. f/u prn

## 2013-04-06 NOTE — Patient Instructions (Signed)

## 2013-04-09 ENCOUNTER — Telehealth: Payer: Self-pay | Admitting: Family Medicine

## 2013-04-09 NOTE — Telephone Encounter (Signed)
Relevant patient education mailed to patient.  

## 2013-07-16 ENCOUNTER — Telehealth: Payer: Self-pay | Admitting: Family Medicine

## 2013-07-16 MED ORDER — KETOPROFEN 75 MG PO CAPS: 75.0000 mg | ORAL_CAPSULE | Freq: Two times a day (BID) | ORAL | Status: DC | PRN

## 2013-07-16 NOTE — Telephone Encounter (Signed)
Caller name: [place]  Call back [phone] Pharmacy:[id number] - [place], [place]   Reason for call:   Pt needs a refill on rx  ketoprofen (ORUDIS) 75 MG capsule .  Pt has contacted  Pharmacy; they say there is no more refills.

## 2013-07-16 NOTE — Telephone Encounter (Signed)
Refill x1   2 refills 

## 2013-07-16 NOTE — Telephone Encounter (Signed)
Rx sent      KP 

## 2013-07-16 NOTE — Telephone Encounter (Signed)
Last seen 04/06/13 and filled 12/14/12 #60 with 4 refills. Please advise    KP

## 2014-02-26 ENCOUNTER — Encounter: Payer: Self-pay | Admitting: Family Medicine

## 2014-02-26 ENCOUNTER — Ambulatory Visit (INDEPENDENT_AMBULATORY_CARE_PROVIDER_SITE_OTHER): Payer: BC Managed Care – PPO | Admitting: Family Medicine

## 2014-02-26 VITALS — BP 106/65 | HR 70 | Temp 97.8°F | Wt 123.4 lb

## 2014-02-26 DIAGNOSIS — J209 Acute bronchitis, unspecified: Secondary | ICD-10-CM

## 2014-02-26 DIAGNOSIS — J208 Acute bronchitis due to other specified organisms: Secondary | ICD-10-CM

## 2014-02-26 DIAGNOSIS — J441 Chronic obstructive pulmonary disease with (acute) exacerbation: Secondary | ICD-10-CM | POA: Insufficient documentation

## 2014-02-26 MED ORDER — PREDNISONE 10 MG PO TABS: ORAL_TABLET | ORAL | Status: DC

## 2014-02-26 MED ORDER — AMOXICILLIN-POT CLAVULANATE 875-125 MG PO TABS: 1.0000 | ORAL_TABLET | Freq: Two times a day (BID) | ORAL | Status: DC

## 2014-02-26 MED ORDER — METHYLPREDNISOLONE ACETATE 80 MG/ML IJ SUSP
80.0000 mg | Freq: Once | INTRAMUSCULAR | Status: AC
  Administered 2014-02-26: 80 mg via INTRAMUSCULAR

## 2014-02-26 MED ORDER — ALBUTEROL SULFATE HFA 108 (90 BASE) MCG/ACT IN AERS: 2.0000 | INHALATION_SPRAY | Freq: Four times a day (QID) | RESPIRATORY_TRACT | Status: DC | PRN

## 2014-02-26 MED ORDER — ALBUTEROL SULFATE (2.5 MG/3ML) 0.083% IN NEBU
2.5000 mg | INHALATION_SOLUTION | Freq: Once | RESPIRATORY_TRACT | Status: AC
  Administered 2014-02-26: 2.5 mg via RESPIRATORY_TRACT

## 2014-02-26 MED ORDER — GUAIFENESIN-CODEINE 100-10 MG/5ML PO SYRP: ORAL_SOLUTION | ORAL | Status: DC

## 2014-02-26 NOTE — Addendum Note (Signed)
Addended by: Ewing Schlein on: 02/26/2014 06:43 PM   Modules accepted: Orders

## 2014-02-26 NOTE — Patient Instructions (Signed)
Acute Bronchitis Bronchitis is inflammation of the airways that extend from the windpipe into the lungs (bronchi). The inflammation often causes mucus to develop. This leads to a cough, which is the most common symptom of bronchitis.  In acute bronchitis, the condition usually develops suddenly and goes away over time, usually in a couple weeks. Smoking, allergies, and asthma can make bronchitis worse. Repeated episodes of bronchitis may cause further lung problems.  CAUSES Acute bronchitis is most often caused by the same virus that causes a cold. The virus can spread from person to person (contagious) through coughing, sneezing, and touching contaminated objects. SIGNS AND SYMPTOMS   Cough.   Fever.   Coughing up mucus.   Body aches.   Chest congestion.   Chills.   Shortness of breath.   Sore throat.  DIAGNOSIS  Acute bronchitis is usually diagnosed through a physical exam. Your health care provider will also ask you questions about your medical history. Tests, such as chest X-rays, are sometimes done to rule out other conditions.  TREATMENT  Acute bronchitis usually goes away in a couple weeks. Oftentimes, no medical treatment is necessary. Medicines are sometimes given for relief of fever or cough. Antibiotic medicines are usually not needed but may be prescribed in certain situations. In some cases, an inhaler may be recommended to help reduce shortness of breath and control the cough. A cool mist vaporizer may also be used to help thin bronchial secretions and make it easier to clear the chest.  HOME CARE INSTRUCTIONS  Get plenty of rest.   Drink enough fluids to keep your urine clear or pale yellow (unless you have a medical condition that requires fluid restriction). Increasing fluids may help thin your respiratory secretions (sputum) and reduce chest congestion, and it will prevent dehydration.   Take medicines only as directed by your health care provider.  If  you were prescribed an antibiotic medicine, finish it all even if you start to feel better.  Avoid smoking and secondhand smoke. Exposure to cigarette smoke or irritating chemicals will make bronchitis worse. If you are a smoker, consider using nicotine gum or skin patches to help control withdrawal symptoms. Quitting smoking will help your lungs heal faster.   Reduce the chances of another bout of acute bronchitis by washing your hands frequently, avoiding people with cold symptoms, and trying not to touch your hands to your mouth, nose, or eyes.   Keep all follow-up visits as directed by your health care provider.  SEEK MEDICAL CARE IF: Your symptoms do not improve after 1 week of treatment.  SEEK IMMEDIATE MEDICAL CARE IF:  You develop an increased fever or chills.   You have chest pain.   You have severe shortness of breath.  You have bloody sputum.   You develop dehydration.  You faint or repeatedly feel like you are going to pass out.  You develop repeated vomiting.  You develop a severe headache. MAKE SURE YOU:   Understand these instructions.  Will watch your condition.  Will get help right away if you are not doing well or get worse. Document Released: 03/25/2004 Document Revised: 07/02/2013 Document Reviewed: 08/08/2012 [facility] Patient Information 2015 [place], [place]. This information is not intended to replace advice given to you by your health care provider. Make sure you discuss any questions you have with your health care provider. Smoking Cessation, Tips for Success If you are ready to quit smoking, congratulations! You have chosen to help yourself be healthier. Cigarettes bring nicotine,  tar, carbon monoxide, and other irritants into your body. Your lungs, heart, and blood vessels will be able to work better without these poisons. There are many different ways to quit smoking. Nicotine gum, nicotine patches, a nicotine inhaler, or nicotine nasal spray can  help with physical craving. Hypnosis, support groups, and medicines help break the habit of smoking. WHAT THINGS CAN I DO TO MAKE QUITTING EASIER?  Here are some tips to help you quit for good:  Pick a date when you will quit smoking completely. Tell all of your friends and family about your plan to quit on that date.  Do not try to slowly cut down on the number of cigarettes you are smoking. Pick a quit date and quit smoking completely starting on that day.  Throw away all cigarettes.   Clean and remove all ashtrays from your home, work, and car.  On a card, write down your reasons for quitting. Carry the card with you and read it when you get the urge to smoke.  Cleanse your body of nicotine. Drink enough water and fluids to keep your urine clear or pale yellow. Do this after quitting to flush the nicotine from your body.  Learn to predict your moods. Do not let a bad situation be your excuse to have a cigarette. Some situations in your life might tempt you into wanting a cigarette.  Never have "just one" cigarette. It leads to wanting another and another. Remind yourself of your decision to quit.  Change habits associated with smoking. If you smoked while driving or when feeling stressed, try other activities to replace smoking. Stand up when drinking your coffee. Brush your teeth after eating. Sit in a different chair when you read the paper. Avoid alcohol while trying to quit, and try to drink fewer caffeinated beverages. Alcohol and caffeine may urge you to smoke.  Avoid foods and drinks that can trigger a desire to smoke, such as sugary or spicy foods and alcohol.  Ask people who smoke not to smoke around you.  Have something planned to do right after eating or having a cup of coffee. For example, plan to take a walk or exercise.  Try a relaxation exercise to calm you down and decrease your stress. Remember, you may be tense and nervous for the first 2 weeks after you quit, but  this will pass.  Find new activities to keep your hands busy. Play with a pen, coin, or rubber band. Doodle or draw things on paper.  Brush your teeth right after eating. This will help cut down on the craving for the taste of tobacco after meals. You can also try mouthwash.   Use oral substitutes in place of cigarettes. Try using lemon drops, carrots, cinnamon sticks, or chewing gum. Keep them handy so they are available when you have the urge to smoke.  When you have the urge to smoke, try deep breathing.  Designate your home as a nonsmoking area.  If you are a heavy smoker, ask your health care provider about a prescription for nicotine chewing gum. It can ease your withdrawal from nicotine.  Reward yourself. Set aside the cigarette money you save and buy yourself something nice.  Look for support from others. Join a support group or smoking cessation program. Ask someone at home or at work to help you with your plan to quit smoking.  Always ask yourself, "Do I need this cigarette or is this just a reflex?" Tell yourself, "Today, I choose  not to smoke," or "I do not want to smoke." You are reminding yourself of your decision to quit.  Do not replace cigarette smoking with electronic cigarettes (commonly called e-cigarettes). The safety of e-cigarettes is unknown, and some may contain harmful chemicals.  If you relapse, do not give up! Plan ahead and think about what you will do the next time you get the urge to smoke. HOW WILL I FEEL WHEN I QUIT SMOKING? You may have symptoms of withdrawal because your body is used to nicotine (the addictive substance in cigarettes). You may crave cigarettes, be irritable, feel very hungry, cough often, get headaches, or have difficulty concentrating. The withdrawal symptoms are only temporary. They are strongest when you first quit but will go away within 10-14 days. When withdrawal symptoms occur, stay in control. Think about your reasons for quitting.  Remind yourself that these are signs that your body is healing and getting used to being without cigarettes. Remember that withdrawal symptoms are easier to treat than the major diseases that smoking can cause.  Even after the withdrawal is over, expect periodic urges to smoke. However, these cravings are generally short lived and will go away whether you smoke or not. Do not smoke! WHAT RESOURCES ARE AVAILABLE TO HELP ME QUIT SMOKING? Your health care provider can direct you to community resources or hospitals for support, which may include:  Group support.  Education.  Hypnosis.  Therapy. Document Released: 11/14/2003 Document Revised: 07/02/2013 Document Reviewed: 08/03/2012 [facility] Patient Information 2015 [place], [place]. This information is not intended to replace advice given to you by your health care provider. Make sure you discuss any questions you have with your health care provider.

## 2014-02-26 NOTE — Progress Notes (Signed)
  Subjective:     Danielle Fox is a 63 y.o. female here for evaluation of a cough. Onset of symptoms was 1 week ago. Symptoms have been gradually worsening since that time. The cough is barky and productive and is aggravated by infection and reclining position. Associated symptoms include: shortness of breath, sputum production and wheezing. Patient does have a history of copd. Patient does have a history of environmental allergens. Patient has not traveled recently. Patient does have a history of smoking. Patient has not had a previous chest x-ray. Patient has not had a PPD done.  The following portions of the patient's history were reviewed and updated as appropriate: allergies, current medications, past family history, past medical history, past social history, past surgical history and problem list.  Review of Systems Pertinent items are noted in HPI.    Objective:  96 Oxygen saturation 96% on room air BP 106/65 mmHg  Pulse 70  Temp(Src) 97.8 F (36.6 C) (Oral)  Wt 123 lb 6.4 oz (55.974 kg)  SpO2 96% General appearance: alert, cooperative, appears stated age and no distress Ears: normal TM's and external ear canals both ears Nose: green discharge, moderate congestion, sinus tenderness bilateral Throat: lips, mucosa, and tongue normal; teeth and gums normal Neck: mild anterior cervical adenopathy, supple, symmetrical, trachea midline and thyroid not enlarged, symmetric, no tenderness/mass/nodules Lungs: diminished breath sounds bilaterally and wheezes bilaterally-- improved with neb-- duoneb Heart: S1, S2 normal    Assessment:    Acute Bronchitis and COPD with exacerbation    Plan:    Antibiotics per medication orders. Antitussives per medication orders. Avoid exposure to tobacco smoke and fumes. B-agonist inhaler. Call if shortness of breath worsens, blood in sputum, change in character of cough, development of fever or chills, inability to maintain nutrition and  hydration. Avoid exposure to tobacco smoke and fumes. Chest x-ray. depo medrol

## 2014-02-26 NOTE — Progress Notes (Signed)
Pre visit review using our clinic review tool, if applicable. No additional management support is needed unless otherwise documented below in the visit note. 

## 2014-02-27 ENCOUNTER — Ambulatory Visit (HOSPITAL_BASED_OUTPATIENT_CLINIC_OR_DEPARTMENT_OTHER)
Admission: RE | Admit: 2014-02-27 | Discharge: 2014-02-27 | Disposition: A | Discharge status: Home / Self Care | Payer: BC Managed Care – PPO | Source: Ambulatory Visit | Attending: Family Medicine | Admitting: Family Medicine

## 2014-02-27 DIAGNOSIS — R918 Other nonspecific abnormal finding of lung field: Secondary | ICD-10-CM | POA: Insufficient documentation

## 2014-02-27 DIAGNOSIS — J449 Chronic obstructive pulmonary disease, unspecified: Secondary | ICD-10-CM | POA: Insufficient documentation

## 2014-02-27 DIAGNOSIS — R05 Cough: Secondary | ICD-10-CM | POA: Diagnosis present

## 2014-02-27 DIAGNOSIS — J208 Acute bronchitis due to other specified organisms: Secondary | ICD-10-CM

## 2014-02-28 ENCOUNTER — Telehealth: Payer: Self-pay

## 2014-02-28 DIAGNOSIS — R911 Solitary pulmonary nodule: Secondary | ICD-10-CM

## 2014-02-28 NOTE — Telephone Encounter (Signed)
-----   Message from Rosalita Chessman, DO sent at 02/28/2014  8:33 AM EST ----- Nodule L lung--- repeat pa and lat cxr in 2 weeks to evaluate-- will need ct if persists

## 2014-02-28 NOTE — Telephone Encounter (Signed)
Spoke with patient and she verbalized understanding, she stated she will repeat the Cxr and 2 weeks and has requested to [facility]. Order is in.      [place]

## 2014-03-12 ENCOUNTER — Ambulatory Visit (HOSPITAL_BASED_OUTPATIENT_CLINIC_OR_DEPARTMENT_OTHER)
Admission: RE | Admit: 2014-03-12 | Discharge: 2014-03-12 | Disposition: A | Discharge status: Home / Self Care | Payer: BLUE CROSS/BLUE SHIELD | Source: Ambulatory Visit | Attending: Family Medicine | Admitting: Family Medicine

## 2014-03-12 DIAGNOSIS — R05 Cough: Secondary | ICD-10-CM | POA: Insufficient documentation

## 2014-03-12 DIAGNOSIS — R911 Solitary pulmonary nodule: Secondary | ICD-10-CM | POA: Insufficient documentation

## 2014-03-13 ENCOUNTER — Telehealth: Payer: Self-pay | Admitting: Family Medicine

## 2014-03-13 ENCOUNTER — Telehealth: Payer: Self-pay

## 2014-03-13 DIAGNOSIS — R918 Other nonspecific abnormal finding of lung field: Secondary | ICD-10-CM

## 2014-03-13 NOTE — Telephone Encounter (Signed)
-----   Message from Rosalita Chessman, DO sent at 03/12/2014  1:26 PM EST ----- Lung nodules--- CT chest no contrast

## 2014-03-13 NOTE — Telephone Encounter (Signed)
Patient has been made aware and voiced understanding. Order has been placed.       KP

## 2014-03-14 ENCOUNTER — Ambulatory Visit (HOSPITAL_BASED_OUTPATIENT_CLINIC_OR_DEPARTMENT_OTHER)
Admission: RE | Admit: 2014-03-14 | Discharge: 2014-03-14 | Disposition: A | Discharge status: Home / Self Care | Payer: BLUE CROSS/BLUE SHIELD | Source: Ambulatory Visit | Attending: Family Medicine | Admitting: Family Medicine

## 2014-03-14 DIAGNOSIS — Z87891 Personal history of nicotine dependence: Secondary | ICD-10-CM | POA: Insufficient documentation

## 2014-03-14 DIAGNOSIS — R918 Other nonspecific abnormal finding of lung field: Secondary | ICD-10-CM | POA: Diagnosis not present

## 2014-03-15 ENCOUNTER — Telehealth: Payer: Self-pay | Admitting: Family Medicine

## 2014-03-15 NOTE — Telephone Encounter (Signed)
Patient has been made aware and voiced understanding.     KP 

## 2014-03-15 NOTE — Telephone Encounter (Signed)
Caller name:Chlar, Mairyn Relation to TK:PTWS Call back number:[phone] Pharmacy:  Reason for call: pt is wanting to know if her results are back from her ct scan that was done on yesterday.

## 2014-03-15 NOTE — Telephone Encounter (Signed)
Notes Recorded by Rosalita Chessman, DO on 03/15/2014 at 1:05 PM Scarring in lung--- no nodule seen. radiologst advises repeat CT chest in 1 year

## 2014-05-02 ENCOUNTER — Telehealth: Payer: Self-pay | Admitting: Family Medicine

## 2014-05-02 NOTE — Telephone Encounter (Signed)
Last seen and filled 02/26/14. Please advise     KP

## 2014-05-02 NOTE — Telephone Encounter (Signed)
Caller name: Zauria, Dombek Relation to pt: self  Call back number: [phone] Pharmacy: [id number] - [place], [place] [phone] (Phone) [phone] (Fax)         Reason for call:  Pt requesting a refill guaiFENesin-codeine (ROBITUSSIN AC) 100-10 MG/5ML syrup

## 2014-05-02 NOTE — Telephone Encounter (Signed)
VM left advising apt needed.     KP

## 2014-05-02 NOTE — Telephone Encounter (Signed)
She would need appointment.

## 2014-05-20 ENCOUNTER — Ambulatory Visit (INDEPENDENT_AMBULATORY_CARE_PROVIDER_SITE_OTHER): Payer: BLUE CROSS/BLUE SHIELD | Admitting: Medical

## 2014-05-20 ENCOUNTER — Encounter: Payer: Self-pay | Admitting: Medical

## 2014-05-20 VITALS — BP 139/67 | HR 76 | Temp 97.8°F | Ht 62.0 in | Wt 125.6 lb

## 2014-05-20 DIAGNOSIS — J441 Chronic obstructive pulmonary disease with (acute) exacerbation: Secondary | ICD-10-CM

## 2014-05-20 DIAGNOSIS — R05 Cough: Secondary | ICD-10-CM

## 2014-05-20 DIAGNOSIS — J209 Acute bronchitis, unspecified: Secondary | ICD-10-CM

## 2014-05-20 DIAGNOSIS — R059 Cough, unspecified: Secondary | ICD-10-CM

## 2014-05-20 MED ORDER — HYDROCODONE-HOMATROPINE 5-1.5 MG/5ML PO SYRP: 5.0000 mL | ORAL_SOLUTION | Freq: Three times a day (TID) | ORAL | Status: DC | PRN

## 2014-05-20 MED ORDER — ALBUTEROL SULFATE HFA 108 (90 BASE) MCG/ACT IN AERS
2.0000 | INHALATION_SPRAY | Freq: Four times a day (QID) | RESPIRATORY_TRACT | Status: DC | PRN
Start: 2014-05-20 — End: 2015-06-27

## 2014-05-20 MED ORDER — CEFDINIR 300 MG PO CAPS: 300.0000 mg | ORAL_CAPSULE | Freq: Two times a day (BID) | ORAL | Status: DC

## 2014-05-20 MED ORDER — PREDNISONE 20 MG PO TABS: ORAL_TABLET | ORAL | Status: DC

## 2014-05-20 MED ORDER — BUDESONIDE-FORMOTEROL FUMARATE 80-4.5 MCG/ACT IN AERO: 2.0000 | INHALATION_SPRAY | Freq: Two times a day (BID) | RESPIRATORY_TRACT | Status: DC

## 2014-05-20 NOTE — Assessment & Plan Note (Signed)
In prior smoker. Rx symbicort. Use albuterol if needed.  I am making prednisone available very low dose only as back up if wheezing worsens. You declined today. But rx should be cheap and have as back up while on vacation.  If cough persists past vacation then will refer to pulmonologist.

## 2014-05-20 NOTE — Patient Instructions (Addendum)
COPD exacerbation In prior smoker. Rx symbicort. Use albuterol if needed.  I am making prednisone available very low dose only as back up if wheezing worsens. You declined today. But rx should be cheap and have as back up while on vacation.  If cough persists past vacation then will refer to pulmonologist.     Acute bronchitis Cefdinir rx. If symptoms persist by time she gets back from vacation the would recommend cxr.   Cough Since January. Will rx hydromet. If her cough persists would refer her to pulmonologist.    Follow up in 7 days or as needed(At least give [place] a call in 7 days)  If you need refill of cough medicine pending potential pulmonology referral. Please call and have message directed to me(Nupur Hohman PA-C)

## 2014-05-20 NOTE — Assessment & Plan Note (Signed)
Cefdinir rx. If symptoms persist by time she gets back from vacation the would recommend cxr.

## 2014-05-20 NOTE — Assessment & Plan Note (Signed)
Since January. Will rx hydromet. If her cough persists would refer her to pulmonologist.

## 2014-05-20 NOTE — Progress Notes (Signed)
Pre visit review using our clinic review tool, if applicable. No additional management support is needed unless otherwise documented below in the visit note. 

## 2014-05-20 NOTE — Progress Notes (Signed)
Subjective:    Patient ID: Danielle Fox, female    DOB: 1950-03-20, 64 y.o.   MRN: [id number]  HPI    Pt in states first of January by Dr. Etter Sjogren. Pt states she stopped smoking December 31 st. Pt coughs and wheezes all the time.   Pt in late December go Robitussin AC, augmentin, prednisone, and proair. She had xrays of chest then eventual Ct of the chest. Pt is aware that she needs to repeat chest ct next January.  Pt trying mucinex dm..  Cough is still productive.  Pt smoked 40 plus years.    Review of Systems  Constitutional: Negative for fever, chills and fatigue.  HENT: Positive for congestion. Negative for ear pain, nosebleeds, sneezing and sore throat.   Respiratory: Positive for cough and wheezing. Negative for choking, chest tightness and shortness of breath.        Productive.  Cardiovascular: Negative for chest pain and palpitations.  Gastrointestinal: Negative for abdominal pain.  Musculoskeletal: Negative for back pain and neck stiffness.  Neurological: Negative for dizziness, seizures, syncope, facial asymmetry, weakness and numbness.  Hematological: Negative for adenopathy. Does not bruise/bleed easily.   Past Medical History  Diagnosis Date  . Osteopenia   . Colon polyps   . 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 Marital Status: Single    Spouse Name: N/A  . Number of Children: N/A  . 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 Smoking status: Former Smoker -- 0.50 packs/day for 40 years    Types: Cigarettes    Quit date: 02/19/2014  . Smokeless tobacco: Never Used  . Alcohol Use: 0.0 oz/week    0 Standard drinks or equivalent per week     Comment: socially  . Drug Use: No  . Sexual Activity:    Partners: Male   Other Topics Concern  . Not on file   Social History Narrative   Exercise-- walks dog on occassion    Past Surgical History    Procedure Laterality Date  . Appendectomy    . Abdominal hysterectomy      Family History  Problem Relation Age of Onset  . Heart disease Mother     cabg, MI  . Alcohol abuse Father   . Cirrhosis Father   . Heart disease Father     MI    No Known Allergies  Current Outpatient Prescriptions on File Prior to Visit  Medication Sig Dispense Refill  . ketoprofen (ORUDIS) 75 MG capsule Take 1 capsule (75 mg total) by mouth 2 (two) times daily as needed. 60 capsule 2  . mometasone (ELOCON) 0.1 % cream Apply topically daily. 45 g 0   No current facility-administered medications on file prior to visit.    BP 139/67 mmHg  Pulse 76  Temp(Src) 97.8 F (36.6 C) (Oral)  Ht 5\' 2"  (1.575 m)  Wt 125 lb 9.6 oz (56.972 kg)  BMI 22.97 kg/m2  SpO2 98%      Objective:   Physical Exam  General  Mental Status - Alert. General Appearance - Well groomed. Not in acute distress.  Skin Rashes- No Rashes.  HEENT Head- Normal. Ear Auditory Canal - Left- Normal. Right - Normal.Tympanic Membrane- Left- Normal. Right- Normal. Eye Sclera/Conjunctiva- Left- Normal. Right- Normal. Nose & Sinuses Nasal Mucosa- Left-  Mild boggy + Congested.  Right-  Mild boggy + Congested. No sinus pressure. Mouth & Throat Lips: Upper Lip- Normal: no dryness, cracking, pallor, cyanosis, or vesicular eruption. Lower Lip-Normal: no dryness, cracking, pallor, cyanosis or vesicular eruption. Buccal Mucosa- Bilateral- No Aphthous ulcers. Oropharynx- No Discharge or Erythema. +pnd. Tonsils: Characteristics- Bilateral- No Erythema or Congestion. Size/Enlargement- Bilateral- No enlargement. Discharge- bilateral-None.  Neck Neck- Supple. No Masses.   Chest and Lung Exam Auscultation: Breath Sounds:- even and unlabored, but bilateral upper lobe rhonchi.  Cardiovascular Auscultation:Rythm- Regular, rate and rhythm. Murmurs & Other Heart Sounds:Ausculatation of the heart reveal- No Murmurs.  Lymphatic Head &  Neck General Head & Neck Lymphatics: Bilateral: Description- No Localized lymphadenopathy.       Assessment & Plan:

## 2014-05-27 ENCOUNTER — Telehealth: Payer: Self-pay | Admitting: *Deleted

## 2014-05-27 NOTE — Telephone Encounter (Signed)
Prior authorization for Symbicort initiated. Awaiting determination from [place]. JG//CMA

## 2014-05-28 NOTE — Telephone Encounter (Signed)
PA denied. Covered alternatives include Advair and Dulera. Please advise. JG//CMA

## 2014-05-29 NOTE — Telephone Encounter (Signed)
PA denied. Covered alternatives include Advair and Dulera. Please advise. JG//CMA

## 2014-05-31 ENCOUNTER — Telehealth: Payer: Self-pay | Admitting: Medical

## 2014-05-31 MED ORDER — FLUTICASONE-SALMETEROL 100-50 MCG/DOSE IN AEPB: 1.0000 | INHALATION_SPRAY | Freq: Two times a day (BID) | RESPIRATORY_TRACT | Status: DC

## 2014-05-31 NOTE — Telephone Encounter (Signed)
rx advair since symbicort not covered.

## 2014-05-31 NOTE — Telephone Encounter (Addendum)
Called patient regarding different medication. Being called in to pharmacy.

## 2014-06-03 ENCOUNTER — Telehealth: Payer: Self-pay | Admitting: Family Medicine

## 2014-06-03 DIAGNOSIS — R059 Cough, unspecified: Secondary | ICD-10-CM

## 2014-06-03 DIAGNOSIS — R05 Cough: Secondary | ICD-10-CM

## 2014-06-03 NOTE — Telephone Encounter (Signed)
Refer to pulmonology for cough.

## 2014-06-03 NOTE — Telephone Encounter (Signed)
Caller name: Yarden Relation to pt: self Call back number: [phone] Pharmacy:  Reason for call:   Wandalee Ferdinand two weeks ago. Would like to see a pulmonologist.

## 2014-06-12 ENCOUNTER — Institutional Professional Consult (permissible substitution): Payer: BLUE CROSS/BLUE SHIELD | Admitting: Internal Medicine

## 2014-06-14 ENCOUNTER — Encounter: Payer: Self-pay | Admitting: Internal Medicine

## 2014-06-14 ENCOUNTER — Ambulatory Visit (INDEPENDENT_AMBULATORY_CARE_PROVIDER_SITE_OTHER): Payer: BLUE CROSS/BLUE SHIELD | Admitting: Internal Medicine

## 2014-06-14 VITALS — BP 116/78 | HR 90 | Ht 62.0 in | Wt 124.4 lb

## 2014-06-14 DIAGNOSIS — R059 Cough, unspecified: Secondary | ICD-10-CM

## 2014-06-14 DIAGNOSIS — R0602 Shortness of breath: Secondary | ICD-10-CM | POA: Insufficient documentation

## 2014-06-14 DIAGNOSIS — R062 Wheezing: Secondary | ICD-10-CM

## 2014-06-14 DIAGNOSIS — R0600 Dyspnea, unspecified: Secondary | ICD-10-CM

## 2014-06-14 DIAGNOSIS — Z87891 Personal history of nicotine dependence: Secondary | ICD-10-CM

## 2014-06-14 DIAGNOSIS — R053 Chronic cough: Secondary | ICD-10-CM | POA: Insufficient documentation

## 2014-06-14 DIAGNOSIS — R05 Cough: Secondary | ICD-10-CM

## 2014-06-14 DIAGNOSIS — Z720 Tobacco use: Secondary | ICD-10-CM | POA: Diagnosis not present

## 2014-06-14 MED ORDER — PREDNISONE 10 MG PO TABS: ORAL_TABLET | ORAL | Status: DC

## 2014-06-14 MED ORDER — FLUTICASONE PROPIONATE 50 MCG/ACT NA SUSP: 2.0000 | Freq: Every day | NASAL | Status: DC

## 2014-06-14 MED ORDER — MOMETASONE FURO-FORMOTEROL FUM 100-5 MCG/ACT IN AERO: 2.0000 | INHALATION_SPRAY | Freq: Two times a day (BID) | RESPIRATORY_TRACT | Status: DC

## 2014-06-14 MED ORDER — GABAPENTIN 300 MG PO CAPS: ORAL_CAPSULE | ORAL | Status: DC

## 2014-06-14 NOTE — Progress Notes (Signed)
Subjective:    Patient ID: Danielle Fox, female    DOB: Oct 24, 1950, 64 y.o.   MRN: [id number]  PCP Garnet Koyanagi, DO   HPI  IOV 06/14/2014  Chief Complaint  Patient presents with  . Pulmonary Consult    Pt has had prod cough with thick yellow-green mucus, chest tightness/congestion, and wheezing since 01/2014. Has tried prednisone in 03/2014 and Advair since 04/2014. Pt state albuterol has not helped either. She stoped smoking in 01/2014.   64 year old female referred for chronic cough with mucus chest tightness and wheezing. Reports that she smoked all her life and quit in December 2015 because she picked up a cold and thought it was time to quit smoking. Subsequently developed insidious onset of chronic cough with mucus production that is occasionally white and occasionally green. Since then cough is been moderate to severe and persistent it is associated with nocturnal wheeze several times at night that is waking her up. In addition she's got shortness of breath with exertion relieved by rest. This no chest pain. Early in January 2016 antibiotic course and prednisone did not help. Subsequently in March 2016 she had another round of antibiotic and prednisone which did not help again. She did go to [place] with her friends in the spring March 2016 for 4 days. During this time cough was resolved. However upon return to [facility] the cough returned. She's been on Advair since March 2016 and finds no relief. Voiuce is hoarse. She has chronic sinus drainage that is untreated.  She does not want CT sinus test right now. The dominant theme though is chronic cough. CT Chest below did NOT Show any emphysema. No hx of GERd. Sometimes she feels a throat burn    Dr Lorenza Cambridge Reflux Symptom Index (> 13-15 suggestive of LPR cough) 06/14/2014   Hoarseness of problem with voice 0  Clearing  Of Throat 0  Excess throat mucus or feeling of post nasal drip 3  Difficulty swallowing food, liquid or tablets  0  Cough after eating or lying down 3  Breathing difficulties or choking episodes 4  Troublesome or annoying cough 5  Sensation of something sticking in throat or lump in throat 0  Heartburn, chest pain, indigestion, or stomach acid coming up 0  TOTAL 15     CT chest 03/14/14  IMPRESSION: 1. No acute infiltrate or pulmonary edema. No mediastinal hematoma or adenopathy. No hilar adenopathy. 2. Mild bilateral apical pleural parenchymal scarring. Minimal hyperinflation. No emphysematous changes. 3. No parenchymal lung nodules are noted. In axial image 19 There is minimal focal pleural thickening in right upper lobe posteriorly measures 3 mm. Similar findings on the same image in left lower lobe posteriorly measures 3 mm. Given risk factors for bronchogenic carcinoma, follow-up chest CT at 1 year is recommended. This recommendation follows the consensus statement: Guidelines for Management of Small Pulmonary Nodules Detected on CT Scans: A Statement from the [place] as published in Radiology 2005; 237:395-400.   Electronically Signed  By: Lahoma Crocker M.D.  On: 03/15/2014 10:02   Spirometry today in the office 06/14/2014: She had severe cough and had difficulty cooperating with the spirometry. The best FEV1 is 1.3 L/59%, FVC is 1.7 6. liters/63% ratio 74/94%. 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   reports that she quit smoking about 3 months ago. Her smoking use included Cigarettes. She has  a 20 pack-year smoking history. She has never used smokeless tobacco.  Past Surgical History  Procedure Laterality Date  . Appendectomy    . Abdominal hysterectomy      No Known Allergies  Immunization History  Administered Date(s) Administered  . Influenza Split 01/07/2011  . Influenza Whole 11/29/2009  . Influenza,inj,Quad PF,36+ Mos 12/14/2012  . Influenza-Unspecified  12/28/2013  . Tdap 03/01/2001    Family History  Problem Relation Age of Onset  . Heart disease Mother     cabg, MI  . Alcohol abuse Father   . Cirrhosis Father   . Heart disease Father     MI     Current outpatient prescriptions:  .  albuterol (PROAIR HFA) 108 (90 BASE) MCG/ACT inhaler, Inhale 2 puffs into the lungs every 6 (six) hours as needed for wheezing or shortness of breath., Disp: 1 Inhaler, Rfl: 3 .  Fluticasone-Salmeterol (ADVAIR) 100-50 MCG/DOSE AEPB, Inhale 1 puff into the lungs 2 (two) times daily., Disp: 1 each, Rfl: 3 .  HYDROcodone-homatropine (HYCODAN) 5-1.5 MG/5ML syrup, Take 5 mLs by mouth every 8 (eight) hours as needed for cough., Disp: 120 mL, Rfl: 0     Review of Systems  Constitutional: Negative for fever and unexpected weight change.  HENT: Positive for congestion. Negative for dental problem, ear pain, mouth sores, nosebleeds, postnasal drip, rhinorrhea, sinus pressure, sneezing, sore throat and trouble swallowing.   Eyes: Negative for redness and itching.  Respiratory: Positive for cough, chest tightness, shortness of breath and wheezing.   Cardiovascular: Negative for palpitations and leg swelling.  Genitourinary: Negative for dysuria.  Neurological: Negative for headaches.  Hematological: Does not bruise/bleed easily.  Psychiatric/Behavioral: Negative for dysphoric mood.       Objective:   Physical Exam  Constitutional: She is oriented to person, place, and time. She appears well-developed and well-nourished. No distress.  Coughs periodically  HENT:  Head: Normocephalic and atraumatic.  Right Ear: External ear normal.  Left Ear: External ear normal.  Mouth/Throat: Oropharynx is clear and moist. No oropharyngeal exudate.  Chronic postnasal drainage plus  Eyes: Conjunctivae and EOM are normal. Pupils are equal, round, and reactive to light. Right eye exhibits no discharge. Left eye exhibits no discharge. No scleral icterus.  Neck: Normal  range of motion. Neck supple. No JVD present. No tracheal deviation present. No thyromegaly present.  Cardiovascular: Normal rate, regular rhythm, normal heart sounds and intact distal pulses.  Exam reveals no gallop and no friction rub.   No murmur heard. Pulmonary/Chest: Effort normal and breath sounds normal. No respiratory distress. She has no wheezes. She has no rales. She exhibits no tenderness.  Forced upper Airway expiratory wheeze present  Abdominal: Soft. Bowel sounds are normal. She exhibits no distension and no mass. There is no tenderness. There is no rebound and no guarding.  Musculoskeletal: Normal range of motion. She exhibits no edema or tenderness.  Lymphadenopathy:    She has no cervical adenopathy.  Neurological: She is alert and oriented to person, place, and time. She has normal reflexes. No cranial nerve deficit. She exhibits normal muscle tone. Coordination normal.  Skin: Skin is warm and dry. No rash noted. She is not diaphoretic. No erythema. No pallor.  Psychiatric: She has a normal mood and affect. Her behavior is normal. Judgment and thought content normal.  Vitals reviewed.   Filed Vitals:   06/14/14 1618  BP: 116/78  Pulse: 90  Height: 5\' 2"  (1.575 m)  Weight: 124 lb 6.4 oz (56.427 kg)  SpO2: 94%         Assessment & Plan:     ICD-9-CM ICD-10-CM   1. Chronic cough 786.2 R05   2. Cough 786.2 R05 Spirometry with graph  3. Wheeze 786.07 R06.2   4. Dyspnea 786.09 R06.00   5. History of smoking V15.82 Z72.0    Cough is from sinus drainage, and post viral reactiv cough and possibly asthma/copd All of this is working together to cause cyclical cough/LPR cough/Cough Hypersensitivity  #Sinus drainage  - start/continue netti pot daily  - start nasal steroid generic fluticasone inhaler 2 squirts each nostril daily - hold off on Sinus CT due to cost  #Possible Asthma / COPD - not sure you have true asthma or copd; hard to sort out with so much cough -  stop advair - there is theory that powder from this can make cough worse -START and TAKE prednisone 40 mg x1 day, then 30 mg x1 day, then 20 mg x1 day, then 10 mg x1 day, and then 5 mg x1 day and stop - Just use albuterol as needed - Switch advair to Dulera 2 puff twice daily - empiric for now - PFT in 4 weeks (being done in 4 weeks because cough negatively impacting testing) - IF PFT unhelpful need to determine through FeNO or MCT if she has   #Cyclical cough  - please choose 2-3 days and observe complete voice rest - no talking or whispering  - at all times there  there is urge to cough, drink water or swallow or sip on throat lozenge - hold off voice rehab due to cost - Take gabapentin 300mg  once daily x 5 days, then 300mg  twice daily x 5 days, then 300mg  three times daily to continue. If this makes you too sleepy or drowsy call us and we will cut your medication dosing down   #Followup - Full PFT in 4 weeks - ROV in 4 weeks with me or my NP Tammy - any problems call or come sooner - at followup  - RSI cough score part 1  - FeNO testing at followup    Dr. Brand Males, M.D., [facility].C.P Pulmonary and Critical Care Medicine Staff Physician [place] [facility] Pager: [phone], If no answer or between  15:00h - 7:00h: call [phone]  06/14/2014 5:00 PM

## 2014-06-14 NOTE — Patient Instructions (Addendum)
ICD-9-CM ICD-10-CM   1. Chronic cough 786.2 R05   2. Cough 786.2 R05 Spirometry with graph  3. Wheeze 786.07 R06.2   4. Dyspnea 786.09 R06.00   5. History of smoking V15.82 Z72.0    Cough is from sinus drainage, and post viral reactiv cough and possibly asthma/copd All of this is working together to cause cyclical cough/LPR cough/Cough Hypersensitivity  #Sinus drainage  - start/continue netti pot daily  - start nasal steroid generic fluticasone inhaler 2 squirts each nostril daily - hold off on Sinus CT due to cost  #Possible Asthma / COPD - not sure you have true asthma or copd - stop advair - there is theory that powder from this can make cough worse -START and TAKE prednisone 40 mg x1 day, then 30 mg x1 day, then 20 mg x1 day, then 10 mg x1 day, and then 5 mg x1 day and stop - Just use albuterol as needed - Switch advair to Dulera 2 puff twice daily - empiric for now - PFT in 4 weeks (being done in 4 weeks because cough negatively impacting testing) - IF PFT unhelpful need to determine through FeNO or MCT if she has   #Cyclical cough  - please choose 2-3 days and observe complete voice rest - no talking or whispering  - at all times there  there is urge to cough, drink water or swallow or sip on throat lozenge - hold off voice rehab due to cost - Take gabapentin 300mg  once daily x 5 days, then 300mg  twice daily x 5 days, then 300mg  three times daily to continue. If this makes you too sleepy or drowsy call us and we will cut your medication dosing down   #Followup - Full PFT in 4 weeks - ROV in 4 weeks with me or my NP Tammy - any problems call or come sooner - at followup  - RSI cough score part 1  - FeNO testing at followup

## 2014-06-17 ENCOUNTER — Telehealth: Payer: Self-pay | Admitting: Medical

## 2014-06-17 NOTE — Telephone Encounter (Signed)
Rx Dulera. Symbicort not covered and that is what should be covered. Looks like pulmonologist rx'd.

## 2014-06-17 NOTE — Telephone Encounter (Signed)
Please advise 

## 2014-06-18 ENCOUNTER — Institutional Professional Consult (permissible substitution): Payer: BLUE CROSS/BLUE SHIELD | Admitting: Pulmonary Disease

## 2014-06-18 NOTE — Telephone Encounter (Signed)
Patient states she has been using Dulera.

## 2014-07-19 ENCOUNTER — Encounter (HOSPITAL_COMMUNITY): Payer: BLUE CROSS/BLUE SHIELD

## 2014-07-19 ENCOUNTER — Ambulatory Visit (INDEPENDENT_AMBULATORY_CARE_PROVIDER_SITE_OTHER): Payer: BLUE CROSS/BLUE SHIELD | Admitting: Internal Medicine

## 2014-07-19 ENCOUNTER — Encounter: Payer: Self-pay | Admitting: Internal Medicine

## 2014-07-19 VITALS — BP 108/70 | HR 88 | Ht 62.0 in | Wt 127.0 lb

## 2014-07-19 DIAGNOSIS — R05 Cough: Secondary | ICD-10-CM | POA: Diagnosis not present

## 2014-07-19 DIAGNOSIS — R053 Chronic cough: Secondary | ICD-10-CM

## 2014-07-19 MED ORDER — HYDROCODONE-HOMATROPINE 5-1.5 MG/5ML PO SYRP: 5.0000 mL | ORAL_SOLUTION | Freq: Three times a day (TID) | ORAL | Status: DC | PRN

## 2014-07-19 MED ORDER — OMEPRAZOLE-SODIUM BICARBONATE 20-1100 MG PO CAPS: 1.0000 | ORAL_CAPSULE | Freq: Every day | ORAL | Status: DC

## 2014-07-19 MED ORDER — RANITIDINE HCL 300 MG PO TABS: 300.0000 mg | ORAL_TABLET | Freq: Every day | ORAL | Status: DC

## 2014-07-19 NOTE — Patient Instructions (Addendum)
ICD-9-CM ICD-10-CM   1. Chronic cough 786.2 R05     Too bad cough is not improved We need to move to phase 2 and you have to follow all below instructions 100% diligence  #Sinus drainage  - continue netti pot daily  - continue nasal steroid generic fluticasone inhaler 2 squirts each nostril daily - do Sinus CT wo contrast  #Possible Asthma / COPD - not sure you have true asthma or copd - Just use albuterol as needed -  For now continue  Dulera 2 puff twice daily - empiric for now - PFT any time now   #Cyclical cough  - please choose 2-3 days and observe complete voice rest - no talking or whispering  - at all times there  there is urge to cough, drink water or swallow or sip on throat lozenge - Refer voice rehab neuro rehab Mr Valentino Saxon - Continue  gabapentin 300mg  three times daily to continue.   #Possible acid reflux - Start Zegerid 20 mg once daily on empty stomach in the morning - Start ranitidine 300 mg once daily at night - Avoid spices, cola, fried food, excess caffeine and alcohol  #Followup -4 weeks with me orNP  - RSI cough score part 1  - FeNO testing at followup - If cough is unimproved then will  consider research protocol

## 2014-07-19 NOTE — Progress Notes (Signed)
Subjective:    Patient ID: Danielle Fox, female    DOB: 05/22/1950, 64 y.o.   MRN: [id number]  HPI     PCP Garnet Koyanagi, DO   HPI  IOV 06/14/2014  Chief Complaint  Patient presents with  . Pulmonary Consult    Pt has had prod cough with thick yellow-green mucus, chest tightness/congestion, and wheezing since 01/2014. Has tried prednisone in 03/2014 and Advair since 04/2014. Pt state albuterol has not helped either. She stoped smoking in 01/2014.   64 year old female referred for chronic cough with mucus chest tightness and wheezing. Reports that she smoked all her life and quit in December 2015 because she picked up a cold and thought it was time to quit smoking. Subsequently developed insidious onset of chronic cough with mucus production that is occasionally white and occasionally green. Since then cough is been moderate to severe and persistent it is associated with nocturnal wheeze several times at night that is waking her up. In addition she's got shortness of breath with exertion relieved by rest. This no chest pain. Early in January 2016 antibiotic course and prednisone did not help. Subsequently in March 2016 she had another round of antibiotic and prednisone which did not help again. She did go to [place] with her friends in the spring March 2016 for 4 days. During this time cough was resolved. However upon return to [facility] the cough returned. She's been on Advair since March 2016 and finds no relief. Voiuce is hoarse. She has chronic sinus drainage that is untreated.  She does not want CT sinus test right now. The dominant theme though is chronic cough. CT Chest below did NOT Show any emphysema. No hx of GERd. Sometimes she feels a throat burn      OV 07/19/2014  Chief Complaint  Patient presents with  . Follow-up    Pt did not have PFT. Pt c/o prod cough with yellow and brown mucus and DOE. Pt stated the gabapentin did not help. Pt denies CP/tightness.       Cough is no better. She followed all Rx instructions for sinus, asthma, and irritable larynx with gabapeint. IN fact on RSI score below cough is worse. She says unchanged. However, she has not done GERD Rx. She has not done PFT and she has not done sinus CT or voice rest/rehab so far   Dr Lorenza Cambridge Reflux Symptom Index (> 13-15 suggestive of LPR cough) 06/14/2014  07/19/2014   Hoarseness of problem with voice 0 3  Clearing  Of Throat 0 3  Excess throat mucus or feeling of post nasal drip 3 3  Difficulty swallowing food, liquid or tablets 0 0  Cough after eating or lying down 3 3  Breathing difficulties or choking episodes 4 2  Troublesome or annoying cough 5 5  Sensation of something sticking in throat or lump in throat 0 2  Heartburn, chest pain, indigestion, or stomach acid coming up 0 0  TOTAL 15 25     Current outpatient prescriptions:  .  albuterol (PROAIR HFA) 108 (90 BASE) MCG/ACT inhaler, Inhale 2 puffs into the lungs every 6 (six) hours as needed for wheezing or shortness of breath., Disp: 1 Inhaler, Rfl: 3 .  fluticasone (FLONASE) 50 MCG/ACT nasal spray, Place 2 sprays into both nostrils daily., Disp: 16 g, Rfl: 5 .  gabapentin (NEURONTIN) 300 MG capsule, 1 tab x 5 days, 1 tab twice daily x 5 days, 1 tab three times daily to continue.,  Disp: 30 capsule, Rfl: 3 .  mometasone-formoterol (DULERA) 100-5 MCG/ACT AERO, Inhale 2 puffs into the lungs 2 (two) times daily., Disp: 1 Inhaler, Rfl: 5 .  HYDROcodone-homatropine (HYCODAN) 5-1.5 MG/5ML syrup, Take 5 mLs by mouth every 8 (eight) hours as needed for cough. (Patient not taking: Reported on 07/19/2014), Disp: 120 mL, Rfl: 0   Review of Systems  Constitutional: Negative for fever and unexpected weight change.  HENT: Negative for congestion, dental problem, ear pain, nosebleeds, postnasal drip, rhinorrhea, sinus pressure, sneezing, sore throat and trouble swallowing.   Eyes: Negative for redness and itching.  Respiratory:  Positive for cough and shortness of breath. Negative for chest tightness and wheezing.   Cardiovascular: Negative for palpitations and leg swelling.  Gastrointestinal: Negative for nausea and vomiting.  Genitourinary: Negative for dysuria.  Musculoskeletal: Negative for joint swelling.  Skin: Negative for rash.  Neurological: Negative for headaches.  Hematological: Does not bruise/bleed easily.  Psychiatric/Behavioral: Negative for dysphoric mood. The patient is not nervous/anxious.        Objective:   Physical Exam  Constitutional: She is oriented to person, place, and time. She appears well-developed and well-nourished. No distress.  HENT:  Head: Normocephalic and atraumatic.  Right Ear: External ear normal.  Left Ear: External ear normal.  Mouth/Throat: Oropharynx is clear and moist. No oropharyngeal exudate.  Nasal twang to voice Hoarse voice  Eyes: Conjunctivae and EOM are normal. Pupils are equal, round, and reactive to light. Right eye exhibits no discharge. Left eye exhibits no discharge. No scleral icterus.  Neck: Normal range of motion. Neck supple. No JVD present. No tracheal deviation present. No thyromegaly present.  Cardiovascular: Normal rate, regular rhythm, normal heart sounds and intact distal pulses.  Exam reveals no gallop and no friction rub.   No murmur heard. Pulmonary/Chest: Effort normal and breath sounds normal. No respiratory distress. She has no wheezes. She has no rales. She exhibits no tenderness.  Abdominal: Soft. Bowel sounds are normal. She exhibits no distension and no mass. There is no tenderness. There is no rebound and no guarding.  Musculoskeletal: Normal range of motion. She exhibits no edema or tenderness.  Lymphadenopathy:    She has no cervical adenopathy.  Neurological: She is alert and oriented to person, place, and time. She has normal reflexes. No cranial nerve deficit. She exhibits normal muscle tone. Coordination normal.  Skin: Skin is  warm and dry. No rash noted. She is not diaphoretic. No erythema. No pallor.  Psychiatric: She has a normal mood and affect. Her behavior is normal. Judgment and thought content normal.  Vitals reviewed.   Filed Vitals:   07/19/14 1610  BP: 108/70  Pulse: 88  Height: 5\' 2"  (1.575 m)  Weight: 127 lb (57.607 kg)  SpO2: 96%         Assessment & Plan:     ICD-9-CM ICD-10-CM   1. Chronic cough 786.2 R05 CT Maxillofacial LTD WO CM     Pulmonary Function Test     AMB referral to Rehabilitation    Too bad cough is not improved We need to move to phase 2 (in blue) and you have to follow all below instructions 100% diligence  #Sinus drainage  - continue netti pot daily  - continue nasal steroid generic fluticasone inhaler 2 squirts each nostril daily - do Sinus CT wo contrast  #Possible Asthma / COPD - not sure you have true asthma or copd - Just use albuterol as needed -  For now continue  Dulera 2 puff twice daily - empiric for now - Do PFT any time now   #Cyclical cough  - please choose 2-3 days and observe complete voice rest - no talking or whispering  - at all times there  there is urge to cough, drink water or swallow or sip on throat lozenge - Refer voice rehab neuro rehab Mr Valentino Saxon - Continue  gabapentin 300mg  three times daily to continue.   #Possible acid reflux - Start Zegerid OTC 20 mg once daily on empty stomach in the morning - Start ranitidine OTC 300 mg once daily at night - Avoid spices, cola, fried food, excess caffeine and alcohol  #Followup -4 weeks with me orNP  - RSI cough score part 1  - FeNO testing at followup - If cough is unimproved then will  consider research protocol or referral   Dr. Brand Males, M.D., [facility].C.P Pulmonary and Critical Care Medicine Staff Physician [place] [facility] Pager: [phone], If no answer or between  15:00h - 7:00h: call [phone]  07/21/2014 6:25 PM

## 2014-07-25 ENCOUNTER — Ambulatory Visit: Payer: BLUE CROSS/BLUE SHIELD

## 2014-07-25 NOTE — Telephone Encounter (Signed)
error 

## 2014-07-26 ENCOUNTER — Other Ambulatory Visit (HOSPITAL_BASED_OUTPATIENT_CLINIC_OR_DEPARTMENT_OTHER): Payer: BLUE CROSS/BLUE SHIELD

## 2014-07-26 ENCOUNTER — Other Ambulatory Visit: Payer: BLUE CROSS/BLUE SHIELD

## 2014-08-02 ENCOUNTER — Ambulatory Visit (HOSPITAL_BASED_OUTPATIENT_CLINIC_OR_DEPARTMENT_OTHER)
Admission: RE | Admit: 2014-08-02 | Discharge: 2014-08-02 | Disposition: A | Discharge status: Home / Self Care | Payer: BLUE CROSS/BLUE SHIELD | Source: Ambulatory Visit | Attending: Internal Medicine | Admitting: Internal Medicine

## 2014-08-02 ENCOUNTER — Telehealth: Payer: Self-pay | Admitting: Internal Medicine

## 2014-08-02 DIAGNOSIS — R0989 Other specified symptoms and signs involving the circulatory and respiratory systems: Secondary | ICD-10-CM | POA: Insufficient documentation

## 2014-08-02 DIAGNOSIS — R053 Chronic cough: Secondary | ICD-10-CM

## 2014-08-02 DIAGNOSIS — R05 Cough: Secondary | ICD-10-CM | POA: Diagnosis not present

## 2014-08-02 DIAGNOSIS — Z87891 Personal history of nicotine dependence: Secondary | ICD-10-CM | POA: Insufficient documentation

## 2014-08-02 NOTE — Telephone Encounter (Signed)
Danielle Fox has abnormal CT sinus  Please refer to [facility] asap  Thanks  Dr. Brand Males, M.D., [facility].C.P Pulmonary and Critical Care Medicine Staff Physician [place] [facility] Pager: [phone], If no answer or between  15:00h - 7:00h: call [phone]  08/02/2014 2:33 PM   IMPRESSION: Almost complete opacification left frontal sinus. Clear right frontal sinus.  Mucosal thickening ethmoid sinus air cells bilaterally.  Moderate mucosal thickening right maxillary sinus measuring up to 7 mm. Opacification right infundibulum.  Mucosal thickening left maxillary sinus measuring up to 4.5 mm. Opacification proximal left infundibulum.  Minimal mucosal thickening anterior inferior aspect of the sphenoid sinus air cells.   Electronically Signed By: Genia Del M.D. On: 08/02/2014 14:13

## 2014-08-04 ENCOUNTER — Other Ambulatory Visit: Payer: Self-pay | Admitting: Internal Medicine

## 2014-08-07 NOTE — Telephone Encounter (Signed)
My concern is she might need surgery. WE could alternatively Rx with 2 weeks augmentni and prednisone and see what happens but she has to accept some side effects of diarrhea etc.,  IF this fails then she needs surgery

## 2014-08-07 NOTE — Telephone Encounter (Signed)
Patient says that she will just wait it out a little while and she will make an appointment soon.  She doesn't want to do anything right now.    FYI to MR

## 2014-08-07 NOTE — Telephone Encounter (Signed)
Ok thanks. I am assuming she does not event want pred/abx.

## 2014-08-07 NOTE — Telephone Encounter (Signed)
Spoke with Danielle Fox and advised of Dr Golden Pop recommendations.  Danielle Fox states she cannot afford to see another specialist at this time and wants to know  if Dr Chase Caller can help her with this.  Please advise.

## 2014-08-16 ENCOUNTER — Ambulatory Visit: Payer: BLUE CROSS/BLUE SHIELD | Admitting: Adult Health

## 2014-08-23 ENCOUNTER — Encounter: Payer: Self-pay | Admitting: Family Medicine

## 2014-08-23 ENCOUNTER — Telehealth: Payer: Self-pay | Admitting: Family Medicine

## 2014-08-23 ENCOUNTER — Ambulatory Visit (INDEPENDENT_AMBULATORY_CARE_PROVIDER_SITE_OTHER): Payer: BLUE CROSS/BLUE SHIELD | Admitting: Family Medicine

## 2014-08-23 ENCOUNTER — Ambulatory Visit (HOSPITAL_BASED_OUTPATIENT_CLINIC_OR_DEPARTMENT_OTHER)
Admission: RE | Admit: 2014-08-23 | Discharge: 2014-08-23 | Disposition: A | Discharge status: Home / Self Care | Payer: BLUE CROSS/BLUE SHIELD | Source: Ambulatory Visit | Attending: Family Medicine | Admitting: Family Medicine

## 2014-08-23 VITALS — BP 102/78 | HR 87 | Temp 97.7°F | Ht 61.0 in | Wt 123.2 lb

## 2014-08-23 DIAGNOSIS — J0100 Acute maxillary sinusitis, unspecified: Secondary | ICD-10-CM | POA: Diagnosis not present

## 2014-08-23 DIAGNOSIS — R053 Chronic cough: Secondary | ICD-10-CM

## 2014-08-23 DIAGNOSIS — M791 Myalgia: Secondary | ICD-10-CM

## 2014-08-23 DIAGNOSIS — R0781 Pleurodynia: Secondary | ICD-10-CM | POA: Diagnosis not present

## 2014-08-23 DIAGNOSIS — R059 Cough, unspecified: Secondary | ICD-10-CM

## 2014-08-23 DIAGNOSIS — Z87891 Personal history of nicotine dependence: Secondary | ICD-10-CM

## 2014-08-23 DIAGNOSIS — N390 Urinary tract infection, site not specified: Secondary | ICD-10-CM

## 2014-08-23 DIAGNOSIS — M7918 Myalgia, other site: Secondary | ICD-10-CM

## 2014-08-23 DIAGNOSIS — R05 Cough: Secondary | ICD-10-CM | POA: Diagnosis not present

## 2014-08-23 DIAGNOSIS — Z720 Tobacco use: Secondary | ICD-10-CM

## 2014-08-23 LAB — CBC
HCT: 44.5 % (ref 36.0–46.0)
Hemoglobin: 14.6 g/dL (ref 12.0–15.0)
MCHC: 32.9 g/dL (ref 30.0–36.0)
MCV: 87.7 fl (ref 78.0–100.0)
Platelets: 250 10*3/uL (ref 150.0–400.0)
RBC: 5.07 Mil/uL (ref 3.87–5.11)
RDW: 14.4 % (ref 11.5–15.5)
WBC: 7.3 10*3/uL (ref 4.0–10.5)

## 2014-08-23 LAB — COMPREHENSIVE METABOLIC PANEL
ALT: 23 U/L (ref 0–35)
AST: 23 U/L (ref 0–37)
Albumin: 4.3 g/dL (ref 3.5–5.2)
Alkaline Phosphatase: 93 U/L (ref 39–117)
BUN: 17 mg/dL (ref 6–23)
CO2: 31 mEq/L (ref 19–32)
Calcium: 9.8 mg/dL (ref 8.4–10.5)
Chloride: 106 mEq/L (ref 96–112)
Creatinine, Ser: 0.94 mg/dL (ref 0.40–1.20)
GFR: 63.69 mL/min (ref >60.00)
Glucose, Bld: 78 mg/dL (ref 70–99)
Potassium: 4 mEq/L (ref 3.5–5.1)
Sodium: 140 mEq/L (ref 135–145)
Total Bilirubin: 0.5 mg/dL (ref 0.2–1.2)
Total Protein: 7.4 g/dL (ref 6.0–8.3)

## 2014-08-23 LAB — URINALYSIS
Bilirubin Urine: NEGATIVE
Hgb urine dipstick: NEGATIVE
Ketones, ur: NEGATIVE
Leukocytes, UA: NEGATIVE
Nitrite: NEGATIVE
Specific Gravity, Urine: >=1.03 — AB (ref 1.000–1.030)
Total Protein, Urine: NEGATIVE
Urine Glucose: NEGATIVE
Urobilinogen, UA: 0.2 (ref 0.0–1.0)
pH: 6 (ref 5.0–8.0)

## 2014-08-23 LAB — SEDIMENTATION RATE: Sed Rate: 11 mm/hr (ref 0–22)

## 2014-08-23 MED ORDER — CETIRIZINE HCL 10 MG PO TABS: 10.0000 mg | ORAL_TABLET | Freq: Every day | ORAL | Status: DC

## 2014-08-23 MED ORDER — FLUTICASONE PROPIONATE 50 MCG/ACT NA SUSP: 2.0000 | Freq: Every day | NASAL | Status: DC

## 2014-08-23 MED ORDER — RANITIDINE HCL 150 MG PO TABS: 150.0000 mg | ORAL_TABLET | Freq: Two times a day (BID) | ORAL | Status: DC

## 2014-08-23 MED ORDER — AMOXICILLIN-POT CLAVULANATE ER 1000-62.5 MG PO TB12: 2.0000 | ORAL_TABLET | Freq: Two times a day (BID) | ORAL | Status: DC

## 2014-08-23 MED ORDER — KETOPROFEN 75 MG PO CAPS: 75.0000 mg | ORAL_CAPSULE | Freq: Two times a day (BID) | ORAL | Status: DC | PRN

## 2014-08-23 MED ORDER — HYDROCODONE-HOMATROPINE 5-1.5 MG/5ML PO SYRP: 5.0000 mL | ORAL_SOLUTION | Freq: Three times a day (TID) | ORAL | Status: DC | PRN

## 2014-08-23 MED ORDER — CYCLOBENZAPRINE HCL 10 MG PO TABS: 5.0000 mg | ORAL_TABLET | Freq: Two times a day (BID) | ORAL | Status: DC | PRN

## 2014-08-23 MED ORDER — MOMETASONE FURO-FORMOTEROL FUM 100-5 MCG/ACT IN AERO: 2.0000 | INHALATION_SPRAY | Freq: Two times a day (BID) | RESPIRATORY_TRACT | Status: DC

## 2014-08-23 NOTE — Telephone Encounter (Signed)
Relation to pt: self  Call back number: [phone]   Reason for call:    Pt realized she did not have HYDROcodone-homatropine (HYCODAN) 5-1.5 MG/5ML syrup. Pt is on her way back to the office

## 2014-08-23 NOTE — Telephone Encounter (Addendum)
Danielle Fox-  pt requesting HYDROcodone-homatropine (HYCODAN) 5-1.5 MG/5ML syrup. Please advise pt contact [phone] when RX is ready for pick up. Pt realzied after she left the office she didn't have RX.

## 2014-08-23 NOTE — Patient Instructions (Addendum)
Mucinex plain twice daily x 3 weeks Probiotics daily such as Digestive Advantage or [facility] with > 64 oz of clear fluids daily Nasal saline flushes twice daily Moist heat and massage with Salon Pas gel twice daily Kegel exercises twice daily Garlic caps, aged daily      Cough, Adult  A cough is a reflex that helps clear your throat and airways. It can help heal the body or may be a reaction to an irritated airway. A cough may only last 2 or 3 weeks (acute) or may last more than 8 weeks (chronic).  CAUSES Acute cough:  Viral or bacterial infections. Chronic cough:  Infections.  Allergies.  Asthma.  Post-nasal drip.  Smoking.  Heartburn or acid reflux.  Some medicines.  Chronic lung problems (COPD).  Cancer. SYMPTOMS   Cough.  Fever.  Chest pain.  Increased breathing rate.  High-pitched whistling sound when breathing (wheezing).  Colored mucus that you cough up (sputum). TREATMENT   A bacterial cough may be treated with antibiotic medicine.  A viral cough must run its course and will not respond to antibiotics.  Your caregiver may recommend other treatments if you have a chronic cough. HOME CARE INSTRUCTIONS   Only take over-the-counter or prescription medicines for pain, discomfort, or fever as directed by your caregiver. Use cough suppressants only as directed by your caregiver.  Use a cold steam vaporizer or humidifier in your bedroom or home to help loosen secretions.  Sleep in a semi-upright position if your cough is worse at night.  Rest as needed.  Stop smoking if you smoke. SEEK IMMEDIATE MEDICAL CARE IF:   You have pus in your sputum.  Your cough starts to worsen.  You cannot control your cough with suppressants and are losing sleep.  You begin coughing up blood.  You have difficulty breathing.  You develop pain which is getting worse or is uncontrolled with medicine.  You have a fever. MAKE SURE YOU:    Understand these instructions.  Will watch your condition.  Will get help right away if you are not doing well or get worse. Document Released: 08/14/2010 Document Revised: 05/10/2011 Document Reviewed: 08/14/2010 [facility] Patient Information 2015 [place], [place]. This information is not intended to replace advice given to you by your health care provider. Make sure you discuss any questions you have with your health care provider.

## 2014-08-23 NOTE — Telephone Encounter (Signed)
Have reprinted per Dr. Frederik Pear instruction.

## 2014-08-23 NOTE — Progress Notes (Signed)
Danielle Fox  [id number] [birth year] 08/23/2014      Progress Note-Follow Up  Subjective  Chief Complaint  Chief Complaint  Patient presents with  . Cough  . Back Pain    HPI  Patient is a 64 y.o. female in today for routine medical care. Patient with numerous complaints. Patient notes she quit smoking 6 months ago and has been struggling with a cough ever since. The cough is sometimes productive sometimes dry. We'll keep her up at night. Has recently worsened and is having some production of green phlegm. She has fatigue and malaise but she denies any fevers or chills. She did recently have a UTI treated as well but does have some persistent urinary frequency. No dysuria or hematuria. Is complaining of some left flank pain but really this left rib pain worse with movement and coughing. Denies pal/HA/fevers/GI or GU c/o. Taking meds as prescribed  Past Medical History  Diagnosis Date  . Osteopenia   . Colon polyps   . 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 Appendectomy    . Abdominal hysterectomy      Family History  Problem Relation Age of Onset  . Heart disease Mother     cabg, MI  . Alcohol abuse Father   . Cirrhosis Father   . Heart disease Father     MI    History   Social History  . Marital Status: Single    Spouse Name: N/A  . Number of Children: N/A  . 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 Smoking status: Former Smoker -- 0.50 packs/day for 40 years    Types: Cigarettes    Quit date: 02/19/2014  . Smokeless tobacco: Never Used  . Alcohol Use: 0.0 oz/week    0 Standard drinks or equivalent per week     Comment: socially  . Drug Use: No  . Sexual Activity:    Partners: Male   Other Topics Concern  . Not on file   Social History Narrative   Exercise-- walks dog on occassion    Current Outpatient Prescriptions on File  Prior to Visit  Medication Sig Dispense Refill  . albuterol (PROAIR HFA) 108 (90 BASE) MCG/ACT inhaler Inhale 2 puffs into the lungs every 6 (six) hours as needed for wheezing or shortness of breath. (Patient not taking: Reported on 08/23/2014) 1 Inhaler 3  . fluticasone (FLONASE) 50 MCG/ACT nasal spray Place 2 sprays into both nostrils daily. 16 g 5  . gabapentin (NEURONTIN) 300 MG capsule TAKE 1 CAP X 5 DAYS, THEN 1 CAP TWICE DAILY X 5 DAYS, THEN 1 CAP THREE TIMES DAILY TO CONTINUE. 30 capsule 3  . HYDROcodone-homatropine (HYCODAN) 5-1.5 MG/5ML syrup Take 5 mLs by mouth every 8 (eight) hours as needed for cough. 120 mL 0  . mometasone-formoterol (DULERA) 100-5 MCG/ACT AERO Inhale 2 puffs into the lungs 2 (two) times daily. 1 Inhaler 5  . Omeprazole-Sodium Bicarbonate (ZEGERID) 20-1100 MG CAPS capsule Take 1 capsule by mouth daily before breakfast. 28 each 5  . ranitidine (ZANTAC) 300 MG tablet Take 1 tablet (300 mg total) by mouth at bedtime. 30 tablet 5   No current facility-administered medications on file prior to visit.    No Known Allergies  Review of Systems  Review of Systems  Constitutional: Positive for malaise/fatigue. Negative  for fever.  HENT: Positive for congestion.   Eyes: Negative for discharge.  Respiratory: Positive for cough, sputum production, shortness of breath and wheezing.   Cardiovascular: Positive for chest pain. Negative for palpitations and leg swelling.  Gastrointestinal: Negative for nausea, abdominal pain and diarrhea.  Genitourinary: Positive for frequency. Negative for dysuria.  Musculoskeletal: Positive for back pain. Negative for falls.  Skin: Negative for rash.  Neurological: Negative for loss of consciousness and headaches.  Endo/Heme/Allergies: Negative for polydipsia.  Psychiatric/Behavioral: Negative for depression and suicidal ideas. The patient is not nervous/anxious and does not have insomnia.     Objective  BP 102/78 mmHg  Pulse 87   Temp(Src) 97.7 F (36.5 C) (Oral)  Ht 5\' 1"  (1.549 m)  Wt 123 lb 4 oz (55.906 kg)  BMI 23.30 kg/m2  SpO2 97%  Physical Exam  Physical Exam  Constitutional: She is oriented to person, place, and time and well-developed, well-nourished, and in no distress. No distress.  HENT:  Head: Normocephalic and atraumatic.  Eyes: Conjunctivae are normal.  Neck: Neck supple. No thyromegaly present.  Cardiovascular: Normal rate, regular rhythm and normal heart sounds.   No murmur heard. Pulmonary/Chest: Effort normal. She has wheezes.  Abdominal: Soft. Bowel sounds are normal. She exhibits no distension and no mass. There is no tenderness. There is no rebound and no guarding.  Musculoskeletal: She exhibits tenderness. She exhibits no edema.  Left lower ribs/left flank with muscle spasm, pain with palp  Lymphadenopathy:    She has no cervical adenopathy.  Neurological: She is alert and oriented to person, place, and time.  Skin: Skin is warm and dry. No rash noted. She is not diaphoretic.  Psychiatric: Memory, affect and judgment normal.    Lab Results  Component Value Date   TSH 1.38 12/14/2012   Lab Results  Component Value Date   WBC 8.4 12/14/2012   HGB 14.3 12/14/2012   HCT 43.0 12/14/2012   MCV 89.4 12/14/2012   PLT 233.0 12/14/2012   Lab Results  Component Value Date   CREATININE 0.9 12/14/2012   BUN 11 12/14/2012   NA 140 12/14/2012   K 3.7 12/14/2012   CL 101 12/14/2012   CO2 29 12/14/2012   Lab Results  Component Value Date   ALT 22 12/14/2012   AST 27 12/14/2012   ALKPHOS 86 12/14/2012   BILITOT 0.8 12/14/2012   Lab Results  Component Value Date   CHOL 246* 12/14/2012   Lab Results  Component Value Date   HDL 65.20 12/14/2012   Lab Results  Component Value Date   LDLCALC 134* 04/10/2012   Lab Results  Component Value Date   TRIG 125.0 12/14/2012   Lab Results  Component Value Date   CHOLHDL 4 12/14/2012     Assessment & Plan  Chronic  UTI Urine culture negative today  Rib pain on left side Xray negative, likely related to costochondritis from coughing, encouraged topical treatment and report if persists  Acute maxillary sinusitis Encouraged increased rest and hydration, add probiotics, zinc such as Coldeze or Xicam. Treat fevers as needed. Started on Augmentin and Mucinex  History of smoking No cigarettes since February 19, 2014 encouraged to continue abstinence  Musculoskeletal pain Encouraged moist heat and gentle stretching as tolerated. May try NSAIDs and prescription meds as directed and report if symptoms worsen or seek immediate care. Cyclobenzaprine prn  Cough Hycodan prn and probiotics.

## 2014-08-23 NOTE — Progress Notes (Signed)
Pre visit review using our clinic review tool, if applicable. No additional management support is needed unless otherwise documented below in the visit note. 

## 2014-08-23 NOTE — Telephone Encounter (Signed)
Please desregard previous phone note

## 2014-08-25 LAB — URINE CULTURE
Colony Count: NO GROWTH
Organism ID, Bacteria: NO GROWTH

## 2014-08-27 ENCOUNTER — Ambulatory Visit: Payer: BLUE CROSS/BLUE SHIELD | Admitting: Family Medicine

## 2014-09-08 DIAGNOSIS — R0781 Pleurodynia: Secondary | ICD-10-CM | POA: Insufficient documentation

## 2014-09-08 DIAGNOSIS — J0100 Acute maxillary sinusitis, unspecified: Secondary | ICD-10-CM | POA: Insufficient documentation

## 2014-09-08 DIAGNOSIS — N390 Urinary tract infection, site not specified: Secondary | ICD-10-CM | POA: Insufficient documentation

## 2014-09-08 NOTE — Assessment & Plan Note (Signed)
Urine culture negative today 

## 2014-09-08 NOTE — Assessment & Plan Note (Signed)
Encouraged moist heat and gentle stretching as tolerated. May try NSAIDs and prescription meds as directed and report if symptoms worsen or seek immediate care. Cyclobenzaprine prn

## 2014-09-08 NOTE — Assessment & Plan Note (Addendum)
Encouraged increased rest and hydration, add probiotics, zinc such as Coldeze or Xicam. Treat fevers as needed. Started on Augmentin and Mucinex

## 2014-09-08 NOTE — Assessment & Plan Note (Signed)
Xray negative, likely related to costochondritis from coughing, encouraged topical treatment and report if persists

## 2014-09-08 NOTE — Assessment & Plan Note (Signed)
No cigarettes since February 19, 2014 encouraged to continue abstinence

## 2014-09-08 NOTE — Assessment & Plan Note (Signed)
Hycodan prn and probiotics.

## 2014-09-10 ENCOUNTER — Other Ambulatory Visit: Payer: Self-pay | Admitting: Family Medicine

## 2014-09-12 ENCOUNTER — Other Ambulatory Visit: Payer: Self-pay | Admitting: Family Medicine

## 2014-09-12 DIAGNOSIS — M62838 Other muscle spasm: Secondary | ICD-10-CM

## 2014-09-12 DIAGNOSIS — R059 Cough, unspecified: Secondary | ICD-10-CM

## 2014-09-12 DIAGNOSIS — R05 Cough: Secondary | ICD-10-CM

## 2014-09-12 MED ORDER — CYCLOBENZAPRINE HCL 10 MG PO TABS: 5.0000 mg | ORAL_TABLET | Freq: Two times a day (BID) | ORAL | Status: DC | PRN

## 2014-09-12 MED ORDER — HYDROCODONE-HOMATROPINE 5-1.5 MG/5ML PO SYRP: 5.0000 mL | ORAL_SOLUTION | Freq: Three times a day (TID) | ORAL | Status: DC | PRN

## 2014-09-20 ENCOUNTER — Ambulatory Visit: Payer: BLUE CROSS/BLUE SHIELD | Admitting: Family Medicine

## 2014-09-24 ENCOUNTER — Telehealth: Payer: Self-pay | Admitting: Family Medicine

## 2014-09-24 ENCOUNTER — Encounter: Payer: Self-pay | Admitting: Family Medicine

## 2014-09-24 ENCOUNTER — Ambulatory Visit (INDEPENDENT_AMBULATORY_CARE_PROVIDER_SITE_OTHER): Payer: BLUE CROSS/BLUE SHIELD | Admitting: Family Medicine

## 2014-09-24 VITALS — HR 92 | Temp 98.3°F | Ht 61.0 in | Wt 121.1 lb

## 2014-09-24 DIAGNOSIS — R05 Cough: Secondary | ICD-10-CM

## 2014-09-24 DIAGNOSIS — K5900 Constipation, unspecified: Secondary | ICD-10-CM

## 2014-09-24 DIAGNOSIS — J209 Acute bronchitis, unspecified: Secondary | ICD-10-CM | POA: Diagnosis not present

## 2014-09-24 DIAGNOSIS — M791 Myalgia: Secondary | ICD-10-CM

## 2014-09-24 DIAGNOSIS — R059 Cough, unspecified: Secondary | ICD-10-CM

## 2014-09-24 DIAGNOSIS — N390 Urinary tract infection, site not specified: Secondary | ICD-10-CM | POA: Diagnosis not present

## 2014-09-24 DIAGNOSIS — M7918 Myalgia, other site: Secondary | ICD-10-CM

## 2014-09-24 MED ORDER — HYDROCODONE-HOMATROPINE 5-1.5 MG/5ML PO SYRP: 5.0000 mL | ORAL_SOLUTION | Freq: Three times a day (TID) | ORAL | Status: DC | PRN

## 2014-09-24 NOTE — Telephone Encounter (Signed)
Fine with me

## 2014-09-24 NOTE — Progress Notes (Signed)
Pre visit review using our clinic review tool, if applicable. No additional management support is needed unless otherwise documented below in the visit note. 

## 2014-09-24 NOTE — Telephone Encounter (Signed)
Patient would like to transfer from Dr. Etter Sjogren to Dr. Charlett Blake please advise.

## 2014-09-24 NOTE — Patient Instructions (Addendum)
Encouraged increased hydration and fiber in diet. Daily probiotics. If bowels not moving can use MOM 2 tbls po in 4 oz of warm prune juice by mouth every 2-3 days. If no results then repeat in 4 hours with  Dulcolax suppository pr, may repeat again in 4 more hours as needed. Seek care if symptoms worsen. Consider daily Miralax and/or Dulcolax if symptoms persist.  Mucinex daily for next couple of weeks  Probiotic daily such as Digestive Advantage or [facility] Colon or NOW 10 strain probiotic cap one daily, can order at [email or web address]   About Constipation  Constipation Overview Constipation is the most common gastrointestinal complaint - about 4 million Americans experience constipation and make 2.5 million physician visits a year to get help for the problem.  Constipation can occur when the colon absorbs too much water, the colon's muscle contraction is slow or sluggish, and/or there is delayed transit time through the colon.  The result is stool that is hard and dry.  Indicators of constipation include straining during bowel movements greater than 25% of the time, having fewer than three bowel movements per week, and/or the feeling of incomplete evacuation.  There are established guidelines ([place] II ) for defining constipation. A person needs to have two or more of the following symptoms for at least 12 weeks (not necessarily consecutive) in the preceding 12 months: . Straining in  greater than 25% of bowel movements . Lumpy or hard stools in greater than 25% of bowel movements . Sensation of incomplete emptying in greater than 25% of bowel movements . Sensation of anorectal obstruction/blockade in greater than 25% of bowel movements . Manual maneuvers to help empty greater than 25% of bowel movements (e.g., digital evacuation, support of the pelvic floor)  . Less than  3 bowel movements/week . Loose stools are not present, and criteria for irritable bowel syndrome are insufficient  Common  Causes of Constipation . Lack of fiber in your diet . Lack of physical activity . Medications, including iron and calcium supplements  . Dairy intake . Dehydration . Abuse of laxatives  Travel  Irritable Bowel Syndrome  Pregnancy  Luteal phase of menstruation (after ovulation and before menses)  Colorectal problems  Intestinal Dysfunction  Treating Constipation  There are several ways of treating constipation, including changes to diet and exercise, use of laxatives, adjustments to the pelvic floor, and scheduled toileting.  These treatments include: . increasing fiber and fluids in the diet  . increasing physical activity . learning muscle coordination   learning proper toileting techniques and toileting modifications   designing and sticking  to a toileting schedule     2007, [employer] Doc.22

## 2014-09-25 NOTE — Telephone Encounter (Signed)
Patient scheduled new patient appointment with Dr. Charlett Blake 03/25/2015

## 2014-10-06 ENCOUNTER — Encounter: Payer: Self-pay | Admitting: Family Medicine

## 2014-10-06 DIAGNOSIS — K5900 Constipation, unspecified: Secondary | ICD-10-CM

## 2014-10-06 HISTORY — DX: Constipation, unspecified: K59.00

## 2014-10-06 NOTE — Assessment & Plan Note (Signed)
Encouraged increased hydration and fiber in diet. Daily probiotics. If bowels not moving can use MOM 2 tbls po in 4 oz of warm prune juice by mouth every 2-3 days. If no results then repeat in 4 hours with  Dulcolax suppository pr, may repeat again in 4 more hours as needed. Seek care if symptoms worsen. Consider daily Miralax and/or Dulcolax if symptoms persist.  

## 2014-10-06 NOTE — Assessment & Plan Note (Signed)
Patient is doing better s/p treatment for respiraotyr illness.

## 2014-10-06 NOTE — Progress Notes (Signed)
Danielle Fox  [id number] [birth year] 10/06/2014      Progress Note-Follow Up  Subjective  Chief Complaint  Chief Complaint  Patient presents with  . Cough    HPI  Patient is a 64 y.o. female in today for routine medical care. Patient is in today for follow-up. Her cough is improved.Her chest wall pain is ed. They both are still present significantly less frequent and intense. No fevers or chills. No new illness, headache. Is noting issues with constipation. Straining to move bowels every co No bloody or tarry stool. Denies CP/palp/SOB/HA/congestion/fevers. Taking meds as prescribed  Past Medical History  Diagnosis Date  . Osteopenia   . Colon polyps   . 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 Constipation 10/06/2014    Past Surgical History  Procedure Laterality Date  . Appendectomy    . Abdominal hysterectomy      Family History  Problem Relation Age of Onset  . Heart disease Mother     cabg, MI  . Alcohol abuse Father   . Cirrhosis Father   . Heart disease Father     MI    History   Social History  . Marital Status: Single    Spouse Name: N/A  . Number of Children: N/A  . 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 Smoking status: Former Smoker -- 0.50 packs/day for 40 years    Types: Cigarettes    Quit date: 02/19/2014  . Smokeless tobacco: Never Used  . Alcohol Use: 0.0 oz/week    0 Standard drinks or equivalent per week     Comment: socially  . Drug Use: No  . Sexual Activity:    Partners: Male   Other Topics Concern  . Not on file   Social History Narrative   Exercise-- walks dog on occassion    Current Outpatient Prescriptions on File Prior to Visit  Medication Sig Dispense Refill  . albuterol (PROAIR HFA) 108 (90 BASE) MCG/ACT inhaler Inhale 2 puffs into the lungs every 6 (six) hours as needed for wheezing or shortness of breath. 1 Inhaler 3  . cetirizine (ZYRTEC ALLERGY)  10 MG tablet Take 1 tablet (10 mg total) by mouth daily. 30 tablet 3  . cyclobenzaprine (FLEXERIL) 10 MG tablet Take 0.5-1 tablets (5-10 mg total) by mouth 2 (two) times daily as needed for muscle spasms. 30 tablet 0  . fluticasone (FLONASE) 50 MCG/ACT nasal spray Place 2 sprays into both nostrils daily. 16 g 5  . ketoprofen (ORUDIS) 75 MG capsule Take 1 capsule (75 mg total) by mouth 2 (two) times daily as needed. 30 capsule 0  . mometasone-formoterol (DULERA) 100-5 MCG/ACT AERO Inhale 2 puffs into the lungs 2 (two) times daily. 1 Inhaler 5   No current facility-administered medications on file prior to visit.    No Known Allergies  Review of Systems  Review of Systems  Constitutional: Negative for fever and malaise/fatigue.  HENT: Negative for congestion.   Eyes: Negative for discharge.  Respiratory: Positive for cough. Negative for shortness of breath.   Cardiovascular: Negative for chest pain, palpitations and leg swelling.  Gastrointestinal: Positive for constipation. Negative for nausea, abdominal pain, diarrhea, blood in stool and melena.  Genitourinary: Negative for dysuria.  Musculoskeletal: Negative for falls.  Skin: Negative for rash.  Neurological: Negative for loss of consciousness and headaches.  Endo/Heme/Allergies: Negative for polydipsia.  Psychiatric/Behavioral: Negative for depression and suicidal ideas. The patient is not nervous/anxious and does not have insomnia.     Objective  Pulse 92  Temp(Src) 98.3 F (36.8 C) (Oral)  Ht 5\' 1"  (1.549 m)  Wt 121 lb 2 oz (54.942 kg)  BMI 22.90 kg/m2  SpO2 97%  Physical Exam  Physical Exam  Constitutional: She is oriented to person, place, and time and well-developed, well-nourished, and in no distress. No distress.  HENT:  Head: Normocephalic and atraumatic.  Eyes: Conjunctivae are normal.  Neck: Neck supple. No thyromegaly present.  Cardiovascular: Normal rate, regular rhythm and normal heart sounds.   No murmur  heard. Pulmonary/Chest: Effort normal and breath sounds normal. She has no wheezes.  Abdominal: She exhibits no distension and no mass.  Musculoskeletal: She exhibits no edema.  Lymphadenopathy:    She has no cervical adenopathy.  Neurological: She is alert and oriented to person, place, and time.  Skin: Skin is warm and dry. No rash noted. She is not diaphoretic.  Psychiatric: Memory, affect and judgment normal.    Lab Results  Component Value Date   TSH 1.38 12/14/2012   Lab Results  Component Value Date   WBC 7.3 08/23/2014   HGB 14.6 08/23/2014   HCT 44.5 08/23/2014   MCV 87.7 08/23/2014   PLT 250.0 08/23/2014   Lab Results  Component Value Date   CREATININE 0.94 08/23/2014   BUN 17 08/23/2014   NA 140 08/23/2014   K 4.0 08/23/2014   CL 106 08/23/2014   CO2 31 08/23/2014   Lab Results  Component Value Date   ALT 23 08/23/2014   AST 23 08/23/2014   ALKPHOS 93 08/23/2014   BILITOT 0.5 08/23/2014   Lab Results  Component Value Date   CHOL 246* 12/14/2012   Lab Results  Component Value Date   HDL 65.20 12/14/2012   Lab Results  Component Value Date   LDLCALC 134* 04/10/2012   Lab Results  Component Value Date   TRIG 125.0 12/14/2012   Lab Results  Component Value Date   CHOLHDL 4 12/14/2012     Assessment & Plan  Musculoskeletal pain Rib pain, likely costochondritis improved. May use Hycodan sparingly prn  Chronic UTI Recent UA and  C&S negative, no new symptoms. Maintain hydration and probiotics  Acute bronchitis Patient is doing better s/p treatment for respiraotyr illness.  Constipation Encouraged increased hydration and fiber in diet. Daily probiotics. If bowels not moving can use MOM 2 tbls po in 4 oz of warm prune juice by mouth every 2-3 days. If no results then repeat in 4 hours with  Dulcolax suppository pr, may repeat again in 4 more hours as needed. Seek care if symptoms worsen. Consider daily Miralax and/or Dulcolax if symptoms  persist.

## 2014-10-06 NOTE — Assessment & Plan Note (Signed)
Rib pain, likely costochondritis improved. May use Hycodan sparingly prn

## 2014-10-06 NOTE — Assessment & Plan Note (Signed)
Recent UA and  C&S negative, no new symptoms. Maintain hydration and probiotics

## 2014-11-13 ENCOUNTER — Encounter: Payer: Self-pay | Admitting: Family Medicine

## 2014-11-13 ENCOUNTER — Other Ambulatory Visit: Payer: Self-pay | Admitting: Family Medicine

## 2014-11-13 DIAGNOSIS — Z1211 Encounter for screening for malignant neoplasm of colon: Secondary | ICD-10-CM

## 2014-11-14 ENCOUNTER — Encounter: Payer: Self-pay | Admitting: Internal Medicine

## 2015-01-17 ENCOUNTER — Encounter: Payer: Self-pay | Admitting: Internal Medicine

## 2015-01-17 ENCOUNTER — Ambulatory Visit (AMBULATORY_SURGERY_CENTER): Payer: Self-pay | Admitting: *Deleted

## 2015-01-17 VITALS — Ht 62.0 in | Wt 124.6 lb

## 2015-01-17 DIAGNOSIS — Z1211 Encounter for screening for malignant neoplasm of colon: Secondary | ICD-10-CM

## 2015-01-17 NOTE — Progress Notes (Signed)
No egg or soy allergy No issues with past sedation No diet pills No home 02 use emmi video declined  

## 2015-01-20 ENCOUNTER — Encounter: Payer: Self-pay | Admitting: Internal Medicine

## 2015-01-27 ENCOUNTER — Encounter: Payer: Self-pay | Admitting: Family Medicine

## 2015-01-27 ENCOUNTER — Other Ambulatory Visit: Payer: Self-pay | Admitting: Family Medicine

## 2015-01-27 DIAGNOSIS — T7840XD Allergy, unspecified, subsequent encounter: Secondary | ICD-10-CM

## 2015-01-31 ENCOUNTER — Ambulatory Visit (AMBULATORY_SURGERY_CENTER): Payer: BLUE CROSS/BLUE SHIELD | Admitting: Internal Medicine

## 2015-01-31 ENCOUNTER — Encounter: Payer: Self-pay | Admitting: Internal Medicine

## 2015-01-31 VITALS — BP 113/70 | HR 75 | Temp 96.3°F | Resp 16 | Ht 62.0 in | Wt 124.0 lb

## 2015-01-31 DIAGNOSIS — D128 Benign neoplasm of rectum: Secondary | ICD-10-CM

## 2015-01-31 DIAGNOSIS — K621 Rectal polyp: Secondary | ICD-10-CM

## 2015-01-31 DIAGNOSIS — D129 Benign neoplasm of anus and anal canal: Secondary | ICD-10-CM

## 2015-01-31 DIAGNOSIS — Z1211 Encounter for screening for malignant neoplasm of colon: Secondary | ICD-10-CM | POA: Diagnosis not present

## 2015-01-31 MED ORDER — SODIUM CHLORIDE 0.9 % IV SOLN: 500.0000 mL | INTRAVENOUS | Status: DC

## 2015-01-31 NOTE — Progress Notes (Signed)
Called to room to assist during endoscopic procedure.  Patient ID and intended procedure confirmed with present staff. Received instructions for my participation in the procedure from the performing physician.  

## 2015-01-31 NOTE — Patient Instructions (Addendum)
I removed 2 small rectal polyps and will let you know what that means re: timing of next exam.  I appreciate the opportunity to care for you. Gatha Mayer, MD, FACG   YOU HAD AN ENDOSCOPIC PROCEDURE TODAY AT [facility]:   Refer to the procedure report that was given to you for any specific questions about what was found during the examination.  If the procedure report does not answer your questions, please call your gastroenterologist to clarify.  If you requested that your care partner not be given the details of your procedure findings, then the procedure report has been included in a sealed envelope for you to review at your convenience later.  YOU SHOULD EXPECT: Some feelings of bloating in the abdomen. Passage of more gas than usual.  Walking can help get rid of the air that was put into your GI tract during the procedure and reduce the bloating. If you had a lower endoscopy (such as a colonoscopy or flexible sigmoidoscopy) you may notice spotting of blood in your stool or on the toilet paper. If you underwent a bowel prep for your procedure, you may not have a normal bowel movement for a few days.  Please Note:  You might notice some irritation and congestion in your nose or some drainage.  This is from the oxygen used during your procedure.  There is no need for concern and it should clear up in a day or so.  SYMPTOMS TO REPORT IMMEDIATELY:   Following lower endoscopy (colonoscopy or flexible sigmoidoscopy):  Excessive amounts of blood in the stool  Significant tenderness or worsening of abdominal pains  Swelling of the abdomen that is new, acute  Fever of 100F or higher   For urgent or emergent issues, a gastroenterologist can be reached at any hour by calling [phone].   DIET: Your first meal following the procedure should be a small meal and then it is ok to progress to your normal diet. Heavy or fried foods are harder to digest and may make you  feel nauseous or bloated.  Likewise, meals heavy in dairy and vegetables can increase bloating.  Drink plenty of fluids but you should avoid alcoholic beverages for 24 hours.  ACTIVITY:  You should plan to take it easy for the rest of today and you should NOT DRIVE or use heavy machinery until tomorrow (because of the sedation medicines used during the test).    FOLLOW UP: Our staff will call the number listed on your records the next business day following your procedure to check on you and address any questions or concerns that you may have regarding the information given to you following your procedure. If we do not reach you, we will leave a message.  However, if you are feeling well and you are not experiencing any problems, there is no need to return our call.  We will assume that you have returned to your regular daily activities without incident.  If any biopsies were taken you will be contacted by phone or by letter within the next 1-3 weeks.  Please call us at [phone] if you have not heard about the biopsies in 3 weeks.    SIGNATURES/CONFIDENTIALITY: You and/or your care partner have signed paperwork which will be entered into your electronic medical record.  These signatures attest to the fact that that the information above on your After Visit Summary has been reviewed and is understood.  Full responsibility of the  confidentiality of this discharge information lies with you and/or your care-partner.   Information on polyps given to you today

## 2015-01-31 NOTE — Progress Notes (Signed)
To recovery, report to [place], RN, VSS 

## 2015-01-31 NOTE — Op Note (Signed)
[place]  [address] [employer] [address] [place], [id number]   COLONOSCOPY PROCEDURE REPORT  PATIENT: Danielle Fox, Danielle Fox  MR#: [id number] BIRTHDATE: Sep 03, 1950 , 64  yrs. old GENDER: female ENDOSCOPIST: Gatha Mayer, MD, [facility] PROCEDURE DATE:  01/31/2015 PROCEDURE:   Colonoscopy, screening and Colonoscopy with snare polypectomy First Screening Colonoscopy - Avg.  risk and is 50 yrs.  old or older - No.  Prior Negative Screening - Now for repeat screening. 10 or more years since last screening  History of Adenoma - Now for follow-up colonoscopy & has been > or = to 3 yrs.  N/A  Polyps removed today? Yes ASA CLASS:   Class II INDICATIONS:Screening for colonic neoplasia and Colorectal Neoplasm Risk Assessment for this procedure is average risk. MEDICATIONS: Propofol 250 mg IV and Monitored anesthesia care  DESCRIPTION OF PROCEDURE:   After the risks benefits and alternatives of the procedure were thoroughly explained, informed consent was obtained.  The digital rectal exam revealed no abnormalities of the rectum.   The LB [id number] and LB [id number]  endoscope was introduced through the anus and advanced to the cecum, which was identified by both the appendix and ileocecal valve. No adverse events experienced.   Limited by a tortuous colon.   The quality of the prep was good.  (MiraLax was used)  The instrument was then slowly withdrawn as the colon was fully examined. Estimated blood loss is zero unless otherwise noted in this procedure report.      COLON FINDINGS: Two smooth sessile polyps ranging from 2 to 48mm in size were found in the rectum.  Polypectomies were performed with a cold snare.  The resection was complete, the polyp tissue was completely retrieved and sent to histology.   There were some other rectal  polyps seen but they were smooth, white and flattened with air insufflartion.Normal colon mucosa otherwise but the sigmoid is angulated and  needed pediatric colonoscope to pass - still with sme mild difficulty.  Retroflexion was not performed due to a narrow rectal vault. The time to cecum = 3.6 Withdrawal time = 16.1   The scope was withdrawn and the procedure completed. COMPLICATIONS: There were no immediate complications.  ENDOSCOPIC IMPRESSION: 1.   Two sessile polyps ranging from 2 to 22mm in size were found in the rectum; polypectomies were performed with a cold snare 2.   Normal colon mucosa otherwise but the sigmoid is angulated and needed pediatric colonoscope to pass - still with sme mild difficulty  RECOMMENDATIONS: 1.  Await pathology results 2.  If polyps not precancerous lean toward non-invasive screening in 10 years due to angulated sigmoid colon.  eSigned:  Gatha Mayer, MD, [facility] 01/31/2015 10:01 AM   cc: The Patient

## 2015-02-03 ENCOUNTER — Telehealth: Payer: Self-pay

## 2015-02-03 NOTE — Telephone Encounter (Signed)
  Follow up Call-  Call back number 01/31/2015  Post procedure Call Back phone  # [phone]  Permission to leave phone message Yes     Patient questions:  Do you have a fever, pain , or abdominal swelling? No. Pain Score  0 *  Have you tolerated food without any problems? Yes.    Have you been able to return to your normal activities? Yes.    Do you have any questions about your discharge instructions: Diet   No. Medications  No. Follow up visit  No.  Do you have questions or concerns about your Care? No.  Actions: * If pain score is 4 or above: No action needed, pain <4.

## 2015-02-10 ENCOUNTER — Encounter: Payer: Self-pay | Admitting: Internal Medicine

## 2015-02-10 NOTE — Progress Notes (Signed)
Quick Note:  Rectal hyperplastic polyps Repeat colon cancer screening 10 yrs - likely non-invasive due to narrow and angulated sigmoid colon ______

## 2015-02-13 ENCOUNTER — Other Ambulatory Visit: Payer: Self-pay | Admitting: Family Medicine

## 2015-02-13 ENCOUNTER — Encounter: Payer: Self-pay | Admitting: Family Medicine

## 2015-02-13 MED ORDER — KETOPROFEN 75 MG PO CAPS: 75.0000 mg | ORAL_CAPSULE | Freq: Two times a day (BID) | ORAL | Status: DC | PRN

## 2015-02-25 ENCOUNTER — Encounter: Payer: Self-pay | Admitting: Family Medicine

## 2015-03-11 ENCOUNTER — Telehealth: Payer: Self-pay | Admitting: Family Medicine

## 2015-03-11 NOTE — Telephone Encounter (Signed)
Start a probiotic and send in Diflucan 150 mg tabs, 1 tab po q week x 3 weeks, disp #3

## 2015-03-11 NOTE — Telephone Encounter (Signed)
Caller name: Self  Can be reached: [phone]  Pharmacy:  [id number] - [place], [place] - [address] AT [place] [phone] (Phone) [phone] (Fax)         Reason for call: Has been under ENT for over a month. Has been on antibiotics for 1 month and now has yeast infection. Requesting rx for yeast infection

## 2015-03-12 MED ORDER — FLUCONAZOLE 150 MG PO TABS: ORAL_TABLET | ORAL | Status: DC

## 2015-03-12 NOTE — Telephone Encounter (Signed)
Patient informed prescription sent in and to take daily probiotic.

## 2015-03-21 ENCOUNTER — Encounter: Payer: Self-pay | Admitting: Family Medicine

## 2015-03-25 ENCOUNTER — Ambulatory Visit: Payer: BLUE CROSS/BLUE SHIELD | Admitting: Family Medicine

## 2015-04-02 HISTORY — PX: SINUS IRRIGATION: SHX2411

## 2015-04-04 ENCOUNTER — Other Ambulatory Visit: Payer: Self-pay | Admitting: Otolaryngology

## 2015-04-11 DIAGNOSIS — J324 Chronic pansinusitis: Secondary | ICD-10-CM | POA: Insufficient documentation

## 2015-04-15 ENCOUNTER — Ambulatory Visit: Payer: BLUE CROSS/BLUE SHIELD | Admitting: Family Medicine

## 2015-04-22 ENCOUNTER — Ambulatory Visit: Payer: BLUE CROSS/BLUE SHIELD | Admitting: Family Medicine

## 2015-05-08 ENCOUNTER — Ambulatory Visit: Payer: BLUE CROSS/BLUE SHIELD | Admitting: Family Medicine

## 2015-05-20 ENCOUNTER — Ambulatory Visit: Payer: BLUE CROSS/BLUE SHIELD | Admitting: Family Medicine

## 2015-06-26 ENCOUNTER — Other Ambulatory Visit: Payer: Self-pay | Admitting: Family Medicine

## 2015-06-26 ENCOUNTER — Ambulatory Visit: Payer: BLUE CROSS/BLUE SHIELD | Admitting: Family Medicine

## 2015-06-26 MED ORDER — MOMETASONE FURO-FORMOTEROL FUM 100-5 MCG/ACT IN AERO: 2.0000 | INHALATION_SPRAY | Freq: Two times a day (BID) | RESPIRATORY_TRACT | Status: DC

## 2015-06-27 ENCOUNTER — Other Ambulatory Visit: Payer: Self-pay | Admitting: Family Medicine

## 2015-06-27 ENCOUNTER — Encounter: Payer: Self-pay | Admitting: Family Medicine

## 2015-06-27 MED ORDER — ALBUTEROL SULFATE HFA 108 (90 BASE) MCG/ACT IN AERS: INHALATION_SPRAY | RESPIRATORY_TRACT | Status: DC

## 2015-06-27 MED ORDER — MOMETASONE FURO-FORMOTEROL FUM 100-5 MCG/ACT IN AERO: 2.0000 | INHALATION_SPRAY | Freq: Two times a day (BID) | RESPIRATORY_TRACT | Status: DC

## 2015-06-27 NOTE — Addendum Note (Signed)
Addended by: Sharon Seller B on: 06/27/2015 12:13 PM   Modules accepted: Orders

## 2015-08-14 ENCOUNTER — Encounter: Payer: Self-pay | Admitting: Pulmonary Disease

## 2015-08-14 ENCOUNTER — Ambulatory Visit (INDEPENDENT_AMBULATORY_CARE_PROVIDER_SITE_OTHER): Payer: BLUE CROSS/BLUE SHIELD | Admitting: Pulmonary Disease

## 2015-08-14 VITALS — BP 111/73 | HR 89 | Ht 61.0 in | Wt 124.0 lb

## 2015-08-14 DIAGNOSIS — R9389 Abnormal findings on diagnostic imaging of other specified body structures: Secondary | ICD-10-CM | POA: Insufficient documentation

## 2015-08-14 DIAGNOSIS — R05 Cough: Secondary | ICD-10-CM

## 2015-08-14 DIAGNOSIS — J929 Pleural plaque without asbestos: Secondary | ICD-10-CM

## 2015-08-14 DIAGNOSIS — J941 Fibrothorax: Secondary | ICD-10-CM

## 2015-08-14 DIAGNOSIS — R938 Abnormal findings on diagnostic imaging of other specified body structures: Secondary | ICD-10-CM | POA: Diagnosis not present

## 2015-08-14 DIAGNOSIS — R053 Chronic cough: Secondary | ICD-10-CM

## 2015-08-14 NOTE — Assessment & Plan Note (Signed)
Combination of upper airway cough syndrome, reflux and smoking related lung disease   Take Mucinex 600 mg twice daily for 2 weeks Take Sudafed ( [facility] store brand ) once daily for 2 weeks  Take Dulera twice daily  Take Pepcid or ranitidine at bedtime for 6 weeks

## 2015-08-14 NOTE — Patient Instructions (Signed)
Take Mucinex 600 mg twice daily for 2 weeks Take Sudafed ( [facility] store brand ) once daily for 2 weeks  Take Dulera twice daily  Take Pepcid or ranitidine at bedtime for 6 weeks  CT chest no contrast to follow up on pleural thickening noted on last year's CT scan

## 2015-08-14 NOTE — Progress Notes (Signed)
   Subjective:    Patient ID: Danielle Fox, female    DOB: 1950-03-05, 65 y.o.   MRN: [id number]  HPI  65 year old female  for FU of  chronic cough with mucus chest tightness and wheezing. she smoked all her life and quit in December 2015  08/14/2015  Chief Complaint  Patient presents with  . Follow-up    chronic cough; wants to discuss getting a CT sinus, last CT sinus was 08/02/14   Last seen 06/2014 She underwent sinus surgery by Dr. Janace Hoard in 04/2015-without much relief in her cough She quit smoking in 2015-cough has returned, she is now taking Dulera daily and this has provided some relief Cough is productive with minimal white sputum-no nocturnal symptoms or wheezing She wonders if she needs another sinus CT scan  She reports episodes of heartburn 2-3 times a week She denies wheezing or frequent chest colds   Significant tests/ events  CT sinuses 07/2014 showed left frontal sinusitis and mucosal thickening right maxillary sinus and ethmoid's  03/2014 CT chest showed mild pleural thickening  05/2014 spirometry showed a ratio 74 FEV1 59% FVC of 63% consistent with moderate restriction   Review of Systems neg for any significant sore throat, dysphagia, itching, sneezing, nasal congestion or excess/ purulent secretions, fever, chills, sweats, unintended wt loss, pleuritic or exertional cp, hempoptysis, orthopnea pnd or change in chronic leg swelling.   Also denies presyncope, palpitations, heartburn, abdominal pain, nausea, vomiting, diarrhea or change in bowel or urinary habits, dysuria,hematuria, rash, arthralgias, visual complaints, headache, numbness weakness or ataxia.     Objective:   Physical Exam   Gen. Pleasant, well-nourished, in no distress ENT - no lesions, no post nasal drip Neck: No JVD, no thyromegaly, no carotid bruits Lungs: no use of accessory muscles, no dullness to percussion, clear without rales or rhonchi  Cardiovascular: Rhythm regular, heart sounds   normal, no murmurs or gallops, no peripheral edema Musculoskeletal: No deformities, no cyanosis or clubbing         Assessment & Plan:

## 2015-08-14 NOTE — Assessment & Plan Note (Signed)
Follow-up CT chest without contrast for follow-up of pleural thickening

## 2015-08-22 ENCOUNTER — Ambulatory Visit (HOSPITAL_BASED_OUTPATIENT_CLINIC_OR_DEPARTMENT_OTHER)
Admission: RE | Admit: 2015-08-22 | Discharge: 2015-08-22 | Disposition: A | Discharge status: Home / Self Care | Payer: BLUE CROSS/BLUE SHIELD | Source: Ambulatory Visit | Attending: Pulmonary Disease | Admitting: Pulmonary Disease

## 2015-08-22 DIAGNOSIS — R918 Other nonspecific abnormal finding of lung field: Secondary | ICD-10-CM | POA: Insufficient documentation

## 2015-08-22 DIAGNOSIS — J941 Fibrothorax: Secondary | ICD-10-CM | POA: Diagnosis present

## 2015-08-22 DIAGNOSIS — R590 Localized enlarged lymph nodes: Secondary | ICD-10-CM | POA: Diagnosis not present

## 2015-08-22 DIAGNOSIS — J929 Pleural plaque without asbestos: Secondary | ICD-10-CM

## 2015-09-23 ENCOUNTER — Other Ambulatory Visit: Payer: Self-pay | Admitting: Family Medicine

## 2015-11-07 ENCOUNTER — Other Ambulatory Visit: Payer: Self-pay | Admitting: Family Medicine

## 2015-11-20 ENCOUNTER — Ambulatory Visit: Payer: BLUE CROSS/BLUE SHIELD | Admitting: Pulmonary Disease

## 2015-12-23 ENCOUNTER — Ambulatory Visit (INDEPENDENT_AMBULATORY_CARE_PROVIDER_SITE_OTHER): Payer: BLUE CROSS/BLUE SHIELD | Admitting: Family Medicine

## 2015-12-23 ENCOUNTER — Encounter: Payer: Self-pay | Admitting: Family Medicine

## 2015-12-23 VITALS — BP 102/68 | HR 72 | Temp 97.9°F | Ht 62.0 in | Wt 125.2 lb

## 2015-12-23 DIAGNOSIS — Z1231 Encounter for screening mammogram for malignant neoplasm of breast: Secondary | ICD-10-CM

## 2015-12-23 DIAGNOSIS — R05 Cough: Secondary | ICD-10-CM

## 2015-12-23 DIAGNOSIS — M8580 Other specified disorders of bone density and structure, unspecified site: Secondary | ICD-10-CM

## 2015-12-23 DIAGNOSIS — L578 Other skin changes due to chronic exposure to nonionizing radiation: Secondary | ICD-10-CM | POA: Diagnosis not present

## 2015-12-23 DIAGNOSIS — R112 Nausea with vomiting, unspecified: Secondary | ICD-10-CM | POA: Diagnosis not present

## 2015-12-23 DIAGNOSIS — R059 Cough, unspecified: Secondary | ICD-10-CM

## 2015-12-23 DIAGNOSIS — Z1239 Encounter for other screening for malignant neoplasm of breast: Secondary | ICD-10-CM

## 2015-12-23 DIAGNOSIS — E782 Mixed hyperlipidemia: Secondary | ICD-10-CM

## 2015-12-23 DIAGNOSIS — K635 Polyp of colon: Secondary | ICD-10-CM

## 2015-12-23 DIAGNOSIS — Z87891 Personal history of nicotine dependence: Secondary | ICD-10-CM

## 2015-12-23 MED ORDER — MOMETASONE FURO-FORMOTEROL FUM 100-5 MCG/ACT IN AERO: 2.0000 | INHALATION_SPRAY | Freq: Two times a day (BID) | RESPIRATORY_TRACT | 0 refills | Status: DC

## 2015-12-23 MED ORDER — KETOPROFEN 75 MG PO CAPS: 75.0000 mg | ORAL_CAPSULE | Freq: Two times a day (BID) | ORAL | 11 refills | Status: DC | PRN

## 2015-12-23 MED ORDER — RANITIDINE HCL 300 MG PO TABS: 300.0000 mg | ORAL_TABLET | Freq: Every day | ORAL | 5 refills | Status: DC

## 2015-12-23 NOTE — Progress Notes (Signed)
Pre visit review using our clinic review tool, if applicable. No additional management support is needed unless otherwise documented below in the visit note. 

## 2015-12-23 NOTE — Patient Instructions (Addendum)
Alger probiotic, 10 strains cap daily Fiber supplements, such as Benefiber, or metamucil   Food Choices for Gastroesophageal Reflux Disease, Adult When you have gastroesophageal reflux disease (GERD), the foods you eat and your eating habits are very important. Choosing the right foods can help ease the discomfort of GERD. WHAT GENERAL GUIDELINES DO I NEED TO FOLLOW?  Choose fruits, vegetables, whole grains, low-fat dairy products, and low-fat meat, fish, and poultry.  Limit fats such as oils, salad dressings, butter, nuts, and avocado.  Keep a food diary to identify foods that cause symptoms.  Avoid foods that cause reflux. These may be different for different people.  Eat frequent small meals instead of three large meals each day.  Eat your meals slowly, in a relaxed setting.  Limit fried foods.  Cook foods using methods other than frying.  Avoid drinking alcohol.  Avoid drinking large amounts of liquids with your meals.  Avoid bending over or lying down until 2-3 hours after eating. WHAT FOODS ARE NOT RECOMMENDED? The following are some foods and drinks that may worsen your symptoms: Vegetables Tomatoes. Tomato juice. Tomato and spaghetti sauce. Chili peppers. Onion and garlic. Horseradish. Fruits Oranges, grapefruit, and lemon (fruit and juice). Meats High-fat meats, fish, and poultry. This includes hot dogs, ribs, ham, sausage, salami, and bacon. Dairy Whole milk and chocolate milk. Sour cream. Cream. Butter. Ice cream. Cream cheese.  Beverages Coffee and tea, with or without caffeine. Carbonated beverages or energy drinks. Condiments Hot sauce. Barbecue sauce.  Sweets/Desserts Chocolate and cocoa. Donuts. Peppermint and spearmint. Fats and Oils High-fat foods, including [place] fries and potato chips. Other Vinegar. Strong spices, such as black pepper, white pepper, red pepper, cayenne, curry powder, cloves, ginger, and chili powder. The items listed above  may not be a complete list of foods and beverages to avoid. Contact your dietitian for more information.   This information is not intended to replace advice given to you by your health care provider. Make sure you discuss any questions you have with your health care provider.   Document Released: 02/15/2005 Document Revised: 03/08/2014 Document Reviewed: 12/20/2012 Elsevier Interactive Patient Education [employer].

## 2015-12-25 ENCOUNTER — Other Ambulatory Visit: Payer: Self-pay | Admitting: Family Medicine

## 2015-12-28 NOTE — Assessment & Plan Note (Signed)
Encouraged heart healthy diet, increase exercise, avoid trans fats, consider a krill oil cap daily 

## 2015-12-28 NOTE — Progress Notes (Signed)
Patient ID: Danielle Fox, female   DOB: February 09, 1951, 65 y.o.   MRN: [id number]   Subjective:    Patient ID: Danielle Fox, female    DOB: Sep 07, 1950, 65 y.o.   MRN: [id number]  Chief Complaint  Patient presents with  . Follow-up    HPI Patient is in today for follow up. Her cough is largely resolved and is very infrequent at this time. No recent illness or hospitalizations. Is more concerned about the worsening of her GI symptoms. She fluctuates between diarrhea and constipation as well as nausea and vomiting. Denies CP/palp/SOB/HA/congestion/fever or GU c/o. Taking meds as prescribed  Past Medical History:  Diagnosis Date  . Allergy   . Colon polyps   . Constipation 10/06/2014  . Osteopenia   . 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 ABDOMINAL HYSTERECTOMY    . APPENDECTOMY    . COLONOSCOPY     01-14-2005    Family History  Problem Relation Age of Onset  . Heart disease Mother     cabg, MI  . Alcohol abuse Father   . Cirrhosis Father   . Heart disease Father     MI  . Colon cancer Neg Hx   . Colon polyps Neg Hx   . Esophageal cancer Neg Hx   . Stomach cancer Neg Hx   . Rectal cancer Neg Hx     Social History   Social History  . Marital status: Single    Spouse name: N/A  . Number of children: N/A  . Years of education: N/A   Occupational History  . admin assi---[employer]   Social History Main Topics  . Smoking status: Former Smoker    Packs/day: 0.50    Years: 40.00    Types: Cigarettes    Quit date: 02/19/2014  . Smokeless tobacco: Never Used  . Alcohol use 0.0 oz/week     Comment: socially  . Drug use: No  . Sexual activity: Yes    Partners: Male   Other Topics Concern  . Not on file   Social History Narrative   Exercise-- walks dog on occassion    Outpatient Medications Prior to Visit  Medication Sig Dispense Refill  . albuterol (PROAIR HFA) 108 (90 Base) MCG/ACT inhaler INHALE  2 PUFFS INTO THE LUNGS EVERY 6 (SIX) HOURS AS NEEDED FOR WHEEZING OR SHORTNESS OF BREATH. 8.5 Inhaler 6  . DULERA 100-5 MCG/ACT AERO INHALE 2 PUFFS INTO THE LUNGS 2 (TWO) TIMES DAILY. 39 Inhaler 0  . ketoprofen (ORUDIS) 75 MG capsule TAKE 1 CAPSULE(75 MG) BY MOUTH TWICE DAILY AS NEEDED 30 capsule 0   No facility-administered medications prior to visit.     Allergies  Allergen Reactions  . Erythromycin Base     Other reaction(s): GI Upset (intolerance)    Review of Systems  Constitutional: Negative for fever and malaise/fatigue.  HENT: Negative for congestion.   Eyes: Negative for blurred vision.  Respiratory: Negative for shortness of breath.   Cardiovascular: Negative for chest pain, palpitations and leg swelling.  Gastrointestinal: Positive for abdominal pain, constipation, diarrhea, nausea and vomiting. Negative for blood in stool and melena.  Genitourinary: Negative for dysuria and frequency.  Musculoskeletal: Negative for falls.  Skin: Negative for rash.  Neurological: Negative for dizziness, loss of consciousness and headaches.  Endo/Heme/Allergies: Negative for environmental allergies.  Psychiatric/Behavioral: Negative for depression. The patient is not nervous/anxious.  Objective:    Physical Exam  Constitutional: She is oriented to person, place, and time. She appears well-developed and well-nourished. No distress.  HENT:  Head: Normocephalic and atraumatic.  Nose: Nose normal.  Eyes: Right eye exhibits no discharge. Left eye exhibits no discharge.  Neck: Normal range of motion. Neck supple.  Cardiovascular: Normal rate and regular rhythm.   No murmur heard. Pulmonary/Chest: Effort normal and breath sounds normal.  Abdominal: Soft. Bowel sounds are normal. There is no tenderness.  Musculoskeletal: She exhibits no edema.  Neurological: She is alert and oriented to person, place, and time.  Skin: Skin is warm and dry.  Psychiatric: She has a normal mood and  affect.  Nursing note and vitals reviewed.   BP 102/68 (BP Location: Left Arm, Patient Position: Sitting, Cuff Size: Normal)   Pulse 72   Temp 97.9 F (36.6 C) (Oral)   Ht 5\' 2"  (1.575 m)   Wt 125 lb 4 oz (56.8 kg)   BMI 22.91 kg/m  Wt Readings from Last 3 Encounters:  12/23/15 125 lb 4 oz (56.8 kg)  08/14/15 124 lb (56.2 kg)  01/31/15 124 lb (56.2 kg)     Lab Results  Component Value Date   WBC 7.3 08/23/2014   HGB 14.6 08/23/2014   HCT 44.5 08/23/2014   PLT 250.0 08/23/2014   GLUCOSE 78 08/23/2014   CHOL 246 (H) 12/14/2012   TRIG 125.0 12/14/2012   HDL 65.20 12/14/2012   LDLDIRECT 155.9 12/14/2012   LDLCALC 134 (H) 04/10/2012   ALT 23 08/23/2014   AST 23 08/23/2014   NA 140 08/23/2014   K 4.0 08/23/2014   CL 106 08/23/2014   CREATININE 0.94 08/23/2014   BUN 17 08/23/2014   CO2 31 08/23/2014   TSH 1.38 12/14/2012    Lab Results  Component Value Date   TSH 1.38 12/14/2012   Lab Results  Component Value Date   WBC 7.3 08/23/2014   HGB 14.6 08/23/2014   HCT 44.5 08/23/2014   MCV 87.7 08/23/2014   PLT 250.0 08/23/2014   Lab Results  Component Value Date   NA 140 08/23/2014   K 4.0 08/23/2014   CO2 31 08/23/2014   GLUCOSE 78 08/23/2014   BUN 17 08/23/2014   CREATININE 0.94 08/23/2014   BILITOT 0.5 08/23/2014   ALKPHOS 93 08/23/2014   AST 23 08/23/2014   ALT 23 08/23/2014   PROT 7.4 08/23/2014   ALBUMIN 4.3 08/23/2014   CALCIUM 9.8 08/23/2014   GFR 63.69 08/23/2014   Lab Results  Component Value Date   CHOL 246 (H) 12/14/2012   Lab Results  Component Value Date   HDL 65.20 12/14/2012   Lab Results  Component Value Date   LDLCALC 134 (H) 04/10/2012   Lab Results  Component Value Date   TRIG 125.0 12/14/2012   Lab Results  Component Value Date   CHOLHDL 4 12/14/2012   No results found for: HGBA1C     Assessment & Plan:   Problem List Items Addressed This Visit    Osteopenia    Encouraged to get adequate exercise, calcium and  vitamin d intake      Hyperplastic colon polyp    Complaining intermittent fluctuations between diarrhea and constipation and also nausea and vomiting.. Encouraged daily probiotics and fiber supplements and referred to gastroenterology for further consideration      History of smoking    Quit smoking 2 years ago.       Hyperlipidemia  Encouraged heart healthy diet, increase exercise, avoid trans fats, consider a krill oil cap daily      Cough    Nearly resolved. Report if worsens once more       Other Visit Diagnoses    Sun-damaged skin    -  Primary   Relevant Orders   Ambulatory referral to Dermatology   Nausea and vomiting, intractability of vomiting not specified, unspecified vomiting type       Relevant Orders   Ambulatory referral to Gastroenterology   Breast cancer screening       Relevant Orders   MM Digital Screening      I have changed Ms. Grandison's DULERA to mometasone-formoterol. I have also changed her ketoprofen. I am also having her start on ranitidine. Additionally, I am having her maintain her albuterol.  Meds ordered this encounter  Medications  . ketoprofen (ORUDIS) 75 MG capsule    Sig: Take 1 capsule (75 mg total) by mouth 2 (two) times daily as needed.    Dispense:  60 capsule    Refill:  11  . mometasone-formoterol (DULERA) 100-5 MCG/ACT AERO    Sig: Inhale 2 puffs into the lungs 2 (two) times daily.    Dispense:  39 Inhaler    Refill:  0  . ranitidine (ZANTAC) 300 MG tablet    Sig: Take 1 tablet (300 mg total) by mouth at bedtime.    Dispense:  30 tablet    Refill:  5     Penni Homans, MD

## 2015-12-28 NOTE — Assessment & Plan Note (Addendum)
Complaining intermittent fluctuations between diarrhea and constipation and also nausea and vomiting.. Encouraged daily probiotics and fiber supplements and referred to gastroenterology for further consideration

## 2015-12-28 NOTE — Assessment & Plan Note (Signed)
[facility]    S:  Danielle Fox is a 65 year old female, seen today at [facility].    Neck Pain   This is a new problem. The current episode started 1 to 4 weeks ago. The problem occurs intermittently. Associated with: Sore musculature at upper right scapula. The pain is present in the right side. The pain is mild. Exacerbated by: Rotating head on neck. Associated symptoms comments: Negative for meningismus  Negative for association with exertion or any other cardiac signs like arm claudication or jaw claudication. He has tried nothing for the symptoms.       Pulm: Quit smoking 2 years ago.  Patient with known mild emphysema found incidentally has no cough, sputum, shortness of breath either at rest or on exertion.      Review of Systems   Musculoskeletal:  Positive for neck pain.       O:  Vitals:  BP 110/65 (Site: LA, Position: Sitting, Cuff Size: Lrg)   Pulse 66   Temp 96 F (35.6 C) (Temporal)   Wt 84.9 kg (187 lb 3.2 oz)   SpO2 96%   BMI 31.15 kg/m     Physical Exam:  Physical Exam  Neck:      Comments:   Right cervical paraspinal musculature is spastic and tender to palpation  Pain provoked by rotating head on neck to the left  Zero meningismus  Cardiovascular:      Rate and Rhythm: Normal rate and regular rhythm. No extrasystoles are present.     Pulses: Normal pulses.      Heart sounds: Normal heart sounds.   Pulmonary:      Comments: Lung sounds clear to auscultation in all fields  Musculoskeletal:      Right lower leg: No edema.      Left lower leg: No edema.      Comments:  Tenderness to palpation of superior lateral belly of trapezius at right            Labs:    Imaging:  CT lung screening    FINDINGS:  Diagnostic quality: Satisfactory     Lung Screening Specific:      Nodules depicted in series 3 as below:      -11 mm partially calcified nodule in the left lower, (image # 119), unchanged.   -3 mm calcified nodule in the left lower lobe, (image #  [id number]), unchanged.      Additional findings:         Thyroid: Unremarkable.     Lymph nodes: No axillary, supraclavicular, lymphadenopathy. A mildly enlarged subcarinal lymph node is unchanged. Stable calcified left hilar lymph node.     Mediastinum: Heart size is normal. There is trace pericardial fluid. The main pulmonary trunk is not dilated. No thoracic aortic aneurysm. Moderate atherosclerotic disease.     Airways: The airways are patent and normal bilaterally.     Lungs/pleura: No pleural effusion or pneumothorax. No focal lung consolidation. Mild background emphysema. Mild right middle lobe scarring, unchanged.     Upper abdomen: There is a small hiatal hernia.     Soft tissues: There is mild gynecomastia.     Bones: No aggressive bony lesions. Chronic right anterior rib fractures are noted.     IMPRESSION:     Stable pulmonary nodules.     LUNG-RADS category:  2 - Benign appearance or behavior  Modifier: None     RECOMMENDATIONS: LDCT 1 Yr - 

## 2015-12-28 NOTE — Assessment & Plan Note (Signed)
Encouraged to get adequate exercise, calcium and vitamin d intake 

## 2015-12-28 NOTE — Assessment & Plan Note (Signed)
Nearly resolved. Report if worsens once more

## 2016-01-05 ENCOUNTER — Ambulatory Visit: Payer: BLUE CROSS/BLUE SHIELD | Admitting: Nurse Practitioner

## 2016-01-06 ENCOUNTER — Ambulatory Visit (HOSPITAL_BASED_OUTPATIENT_CLINIC_OR_DEPARTMENT_OTHER)
Admission: RE | Admit: 2016-01-06 | Discharge: 2016-01-06 | Disposition: A | Discharge status: Home / Self Care | Payer: BLUE CROSS/BLUE SHIELD | Source: Ambulatory Visit | Attending: Family Medicine | Admitting: Family Medicine

## 2016-01-06 DIAGNOSIS — Z1231 Encounter for screening mammogram for malignant neoplasm of breast: Secondary | ICD-10-CM | POA: Insufficient documentation

## 2016-01-06 DIAGNOSIS — Z1239 Encounter for other screening for malignant neoplasm of breast: Secondary | ICD-10-CM

## 2016-02-02 ENCOUNTER — Encounter: Payer: Self-pay | Admitting: Family Medicine

## 2016-06-29 ENCOUNTER — Ambulatory Visit (INDEPENDENT_AMBULATORY_CARE_PROVIDER_SITE_OTHER): Payer: BLUE CROSS/BLUE SHIELD | Admitting: Family Medicine

## 2016-06-29 ENCOUNTER — Encounter: Payer: Self-pay | Admitting: Family Medicine

## 2016-06-29 VITALS — BP 110/62 | HR 77 | Temp 97.9°F | Resp 18 | Ht 62.0 in | Wt 122.2 lb

## 2016-06-29 DIAGNOSIS — E785 Hyperlipidemia, unspecified: Secondary | ICD-10-CM

## 2016-06-29 DIAGNOSIS — L299 Pruritus, unspecified: Secondary | ICD-10-CM

## 2016-06-29 DIAGNOSIS — Z0000 Encounter for general adult medical examination without abnormal findings: Secondary | ICD-10-CM

## 2016-06-29 DIAGNOSIS — Z23 Encounter for immunization: Secondary | ICD-10-CM

## 2016-06-29 DIAGNOSIS — E559 Vitamin D deficiency, unspecified: Secondary | ICD-10-CM

## 2016-06-29 DIAGNOSIS — M8580 Other specified disorders of bone density and structure, unspecified site: Secondary | ICD-10-CM | POA: Diagnosis not present

## 2016-06-29 HISTORY — DX: Pruritus, unspecified: L29.9

## 2016-06-29 HISTORY — DX: Encounter for general adult medical examination without abnormal findings: Z00.00

## 2016-06-29 LAB — LIPID PANEL
Cholesterol: 245 mg/dL — ABNORMAL HIGH (ref 0–200)
HDL: 68.3 mg/dL (ref >39.00)
LDL Cholesterol: 155 mg/dL — ABNORMAL HIGH (ref 0–99)
NonHDL: 176.98
Total CHOL/HDL Ratio: 4
Triglycerides: 112 mg/dL (ref 0.0–149.0)
VLDL: 22.4 mg/dL (ref 0.0–40.0)

## 2016-06-29 LAB — COMPREHENSIVE METABOLIC PANEL
ALT: 19 U/L (ref 0–35)
AST: 22 U/L (ref 0–37)
Albumin: 4.3 g/dL (ref 3.5–5.2)
Alkaline Phosphatase: 94 U/L (ref 39–117)
BUN: 19 mg/dL (ref 6–23)
CO2: 29 mEq/L (ref 19–32)
Calcium: 9.6 mg/dL (ref 8.4–10.5)
Chloride: 103 mEq/L (ref 96–112)
Creatinine, Ser: 0.95 mg/dL (ref 0.40–1.20)
GFR: 62.56 mL/min (ref >60.00)
Glucose, Bld: 97 mg/dL (ref 70–99)
Potassium: 3.9 mEq/L (ref 3.5–5.1)
Sodium: 138 mEq/L (ref 135–145)
Total Bilirubin: 0.8 mg/dL (ref 0.2–1.2)
Total Protein: 7.6 g/dL (ref 6.0–8.3)

## 2016-06-29 LAB — TSH: TSH: 2.82 u[IU]/mL (ref 0.35–4.50)

## 2016-06-29 LAB — CBC
HCT: 44.4 % (ref 36.0–46.0)
Hemoglobin: 14.6 g/dL (ref 12.0–15.0)
MCHC: 33 g/dL (ref 30.0–36.0)
MCV: 88.3 fl (ref 78.0–100.0)
Platelets: 272 10*3/uL (ref 150.0–400.0)
RBC: 5.03 Mil/uL (ref 3.87–5.11)
RDW: 14.5 % (ref 11.5–15.5)
WBC: 6.9 10*3/uL (ref 4.0–10.5)

## 2016-06-29 LAB — VITAMIN D 25 HYDROXY (VIT D DEFICIENCY, FRACTURES): VITD: 18.86 ng/mL — ABNORMAL LOW (ref 30.00–100.00)

## 2016-06-29 MED ORDER — MOMETASONE FURO-FORMOTEROL FUM 100-5 MCG/ACT IN AERO: 2.0000 | INHALATION_SPRAY | Freq: Two times a day (BID) | RESPIRATORY_TRACT | 3 refills | Status: DC

## 2016-06-29 NOTE — Assessment & Plan Note (Signed)
Encouraged heart healthy diet, increase exercise, avoid trans fats, consider a krill oil cap daily 

## 2016-06-29 NOTE — Progress Notes (Signed)
Subjective:  I acted as a Education administrator for Dr. Charlett Blake. [place], [place]   Patient ID: Danielle Fox, female    DOB: [birth year], 66 y.o.   MRN: [id number]  Chief Complaint  Patient presents with  . Annual Exam    HPI  Patient is in today for an annual exam. She is stable with her ADLs. She is going to see her first grandson and will be getting her PNA and tDAp today. She states she has really bad itching all over she will use Zantac and Zyrtec twice daily. She feels well. Is retiring this week and is headed to to [place] to meet a grandson. She denies any recent febrile illness or hospitalizations. She is staying active and miaintaining a heart healthy diet. Patient Care Team: Mosie Lukes, MD as PCP - General (Family Medicine)   Past Medical History:  Diagnosis Date  . Allergy   . Colon polyps   . Constipation 10/06/2014  . Osteopenia   . 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 Preventative health care 06/29/2016  . Pruritus 06/29/2016    Past Surgical History:  Procedure Laterality Date  . ABDOMINAL HYSTERECTOMY    . APPENDECTOMY    . COLONOSCOPY     01-14-2005  . SINUS IRRIGATION  04/02/2015    Family History  Problem Relation Age of Onset  . Heart disease Mother     cabg, [place]  . Alcohol abuse Father   . Cirrhosis Father   . Heart disease Father     [place]  . Colon cancer Neg Hx   . Colon polyps Neg Hx   . Esophageal cancer Neg Hx   . Stomach cancer Neg Hx   . Rectal cancer Neg Hx     Social History   Social History  . Marital status: Single    Spouse name: N/A  . Number of children: N/A  . Years of education: N/A   Occupational History  . admin assi---[employer]   Social History Main Topics  . Smoking status: Former Smoker    Packs/day: 0.50    Years: 40.00    Types: Cigarettes    Quit date: 02/19/2014  . Smokeless tobacco: Never Used  . Alcohol use 0.0 oz/week     Comment: socially  . Drug use: No  . Sexual activity: Yes    Partners: Male   Other Topics  Concern  . Not on file   Social History Narrative   Exercise-- walks dog on occassion    Outpatient Medications Prior to Visit  Medication Sig Dispense Refill  . DULERA 100-5 MCG/ACT AERO INHALE 2 PUFFS INTO THE LUNGS 2 (TWO) TIMES DAILY. 39 Inhaler 3  . albuterol (PROAIR HFA) 108 (90 Base) MCG/ACT inhaler INHALE 2 PUFFS INTO THE LUNGS EVERY 6 (SIX) HOURS AS NEEDED FOR WHEEZING OR SHORTNESS OF BREATH. 8.5 Inhaler 6  . ketoprofen (ORUDIS) 75 MG capsule Take 1 capsule (75 mg total) by mouth 2 (two) times daily as needed. 60 capsule 11  . mometasone-formoterol (DULERA) 100-5 MCG/ACT AERO Inhale 2 puffs into the lungs 2 (two) times daily. 39 Inhaler 0  . ranitidine (ZANTAC) 300 MG tablet Take 1 tablet (300 mg total) by mouth at bedtime. 30 tablet 5   No facility-administered medications prior to visit.     Allergies  Allergen Reactions  . Erythromycin Base     Other reaction(s): GI Upset (intolerance)    Review of Systems  Constitutional: Negative for  chills, fever and malaise/fatigue.  HENT: Negative for congestion and hearing loss.   Eyes: Negative for discharge.  Respiratory: Negative for cough, sputum production and shortness of breath.   Cardiovascular: Negative for chest pain, palpitations and leg swelling.  Gastrointestinal: Negative for abdominal pain, blood in stool, constipation, diarrhea, heartburn, nausea and vomiting.  Genitourinary: Negative for dysuria, frequency, hematuria and urgency.  Musculoskeletal: Negative for back pain, falls and myalgias.  Skin: Negative for rash.  Neurological: Negative for dizziness, sensory change, loss of consciousness, weakness and headaches.  Endo/Heme/Allergies: Negative for environmental allergies. Does not bruise/bleed easily.  Psychiatric/Behavioral: Negative for depression and suicidal ideas. The patient is not nervous/anxious and does not have insomnia.        Objective:    Physical Exam  Constitutional: She is oriented to  person, place, and time. She appears well-developed and well-nourished. No distress.  HENT:  Head: Normocephalic and atraumatic.  Eyes: Conjunctivae are normal.  Neck: Neck supple. No thyromegaly present.  Cardiovascular: Normal rate, regular rhythm and normal heart sounds.   No murmur heard. Pulmonary/Chest: Effort normal and breath sounds normal. No respiratory distress.  Abdominal: Soft. Bowel sounds are normal. She exhibits no distension and no mass. There is no tenderness.  Musculoskeletal: She exhibits no edema.  Lymphadenopathy:    She has no cervical adenopathy.  Neurological: She is alert and oriented to person, place, and time.  Skin: Skin is warm and dry.  Psychiatric: She has a normal mood and affect. Her behavior is normal.    BP 110/62 (BP Location: Left Arm, Patient Position: Sitting, Cuff Size: Normal)   Pulse 77   Temp 97.9 F (36.6 C) (Oral)   Resp 18   Ht 5\' 2"  (1.575 m)   Wt 122 lb 3.2 oz (55.4 kg)   SpO2 97%   BMI 22.35 kg/m  Wt Readings from Last 3 Encounters:  06/29/16 122 lb 3.2 oz (55.4 kg)  12/23/15 125 lb 4 oz (56.8 kg)  08/14/15 124 lb (56.2 kg)   BP Readings from Last 3 Encounters:  06/29/16 110/62  12/23/15 102/68  08/14/15 111/73     Immunization History  Administered Date(s) Administered  . Influenza Split 01/07/2011  . Influenza Whole 11/29/2009  . Influenza,inj,Quad PF,36+ Mos 12/14/2012  . Influenza-Unspecified 12/28/2013, 12/29/2014, 12/01/2015  . Tdap 03/01/2001    Health Maintenance  Topic Date Due  . Hepatitis C Screening  1950-05-10  . HIV Screening  07/06/1965  . TETANUS/TDAP  09/16/2013  . PNA vac Low Risk Adult (1 of 2 - PCV13) 07/07/2015  . INFLUENZA VACCINE  09/29/2016  . MAMMOGRAM  01/05/2018  . COLONOSCOPY  01/30/2025  . DEXA SCAN  Completed  . PAP SMEAR  Excluded    Lab Results  Component Value Date   WBC 7.3 08/23/2014   HGB 14.6 08/23/2014   HCT 44.5 08/23/2014   PLT 250.0 08/23/2014   GLUCOSE 78  08/23/2014   CHOL 246 (H) 12/14/2012   TRIG 125.0 12/14/2012   HDL 65.20 12/14/2012   LDLDIRECT 155.9 12/14/2012   LDLCALC 134 (H) 04/10/2012   ALT 23 08/23/2014   AST 23 08/23/2014   NA 140 08/23/2014   K 4.0 08/23/2014   CL 106 08/23/2014   CREATININE 0.94 08/23/2014   BUN 17 08/23/2014   CO2 31 08/23/2014   TSH 1.38 12/14/2012    Lab Results  Component Value Date   TSH 1.38 12/14/2012   Lab Results  Component Value Date   WBC 7.3 08/23/2014  HGB 14.6 08/23/2014   HCT 44.5 08/23/2014   MCV 87.7 08/23/2014   PLT 250.0 08/23/2014   Lab Results  Component Value Date   NA 140 08/23/2014   K 4.0 08/23/2014   CO2 31 08/23/2014   GLUCOSE 78 08/23/2014   BUN 17 08/23/2014   CREATININE 0.94 08/23/2014   BILITOT 0.5 08/23/2014   ALKPHOS 93 08/23/2014   AST 23 08/23/2014   ALT 23 08/23/2014   PROT 7.4 08/23/2014   ALBUMIN 4.3 08/23/2014   CALCIUM 9.8 08/23/2014   GFR 63.69 08/23/2014   Lab Results  Component Value Date   CHOL 246 (H) 12/14/2012   Lab Results  Component Value Date   HDL 65.20 12/14/2012   Lab Results  Component Value Date   LDLCALC 134 (H) 04/10/2012   Lab Results  Component Value Date   TRIG 125.0 12/14/2012   Lab Results  Component Value Date   CHOLHDL 4 12/14/2012   No results found for: HGBA1C       Assessment & Plan:   Problem List Items Addressed This Visit    Osteopenia   Relevant Orders   VITAMIN D 25 Hydroxy (Vit-D Deficiency, Fractures)   Hyperlipidemia, mild    Encouraged heart healthy diet, increase exercise, avoid trans fats, consider a krill oil cap daily      Relevant Orders   Lipid panel   TSH   Preventative health care    Patient encouraged to maintain heart healthy diet, regular exercise, adequate sleep. Consider daily probiotics. Take medications as prescribed. Given Tdap and Prevnar today. Is leaving to visit her brand new grandson      Relevant Orders   CBC   Comprehensive metabolic panel   Lipid  panel   TSH   VITAMIN D 25 Hydroxy (Vit-D Deficiency, Fractures)    Other Visit Diagnoses    Vitamin D deficiency    -  Primary   Relevant Orders   VITAMIN D 25 Hydroxy (Vit-D Deficiency, Fractures)      I have discontinued Ms. Chawla's albuterol, ketoprofen, mometasone-formoterol, and ranitidine. I am also having her maintain her [place].  No orders of the defined types were placed in this encounter. CMA served as Education administrator during this visit. History, Physical and Plan performed by medical provider. Documentation and orders reviewed and attested to.   Penni Homans 06/29/2016 9:10 AM Penni Homans, MD

## 2016-06-29 NOTE — Addendum Note (Signed)
Addended by: Magdalene Molly A on: 06/29/2016 09:18 AM   Modules accepted: Orders

## 2016-06-29 NOTE — Assessment & Plan Note (Signed)
Encouraged Zyrtec and Zantac bid and add Sarna lotion and witch hazel astringent

## 2016-06-29 NOTE — Progress Notes (Signed)
Pre visit review using our clinic review tool, if applicable. No additional management support is needed unless otherwise documented below in the visit note. 

## 2016-06-29 NOTE — Patient Instructions (Addendum)
Sarna Lotion to help with lotion Take OTC 60m of Zantac twice day to help with itching  Zyrtec twice a day  Preventive Care 40-64 Years, Female Preventive care refers to lifestyle choices and visits with your health care provider that can promote health and wellness. What does preventive care include?  A yearly physical exam. This is also called an annual well check.  Dental exams once or twice a year.  Routine eye exams. Ask your health care provider how often you should have your eyes checked.  Personal lifestyle choices, including:  Daily care of your teeth and gums.  Regular physical activity.  Eating a healthy diet.  Avoiding tobacco and drug use.  Limiting alcohol use.  Practicing safe sex.  Taking low-dose aspirin daily starting at age 66  Taking vitamin and mineral supplements as recommended by your health care provider. What happens during an annual well check? The services and screenings done by your health care provider during your annual well check will depend on your age, overall health, lifestyle risk factors, and family history of disease. Counseling  Your health care provider may ask you questions about your:  Alcohol use.  Tobacco use.  Drug use.  Emotional well-being.  Home and relationship well-being.  Sexual activity.  Eating habits.  Work and work eStatistician  Method of birth control.  Menstrual cycle.  Pregnancy history. Screening  You may have the following tests or measurements:  Height, weight, and BMI.  Blood pressure.  Lipid and cholesterol levels. These may be checked every 5 years, or more frequently if you are over 541years old.  Skin check.  Lung cancer screening. You may have this screening every year starting at age 1266if you have a 30-pack-year history of smoking and currently smoke or have quit within the past 15 years.  Fecal occult blood test (FOBT) of the stool. You may have this test every year starting at  age 66  Flexible sigmoidoscopy or colonoscopy. You may have a sigmoidoscopy every 5 years or a colonoscopy every 10 years starting at age 66  Hepatitis C blood test.  Hepatitis B blood test.  Sexually transmitted disease (STD) testing.  Diabetes screening. This is done by checking your blood sugar (glucose) after you have not eaten for a while (fasting). You may have this done every 1-3 years.  Mammogram. This may be done every 1-2 years. Talk to your health care provider about when you should start having regular mammograms. This may depend on whether you have a family history of breast cancer.  BRCA-related cancer screening. This may be done if you have a family history of breast, ovarian, tubal, or peritoneal cancers.  Pelvic exam and Pap test. This may be done every 3 years starting at age 66 Starting at age 66 this may be done every 5 years if you have a Pap test in combination with an HPV test.  Bone density scan. This is done to screen for osteoporosis. You may have this scan if you are at high risk for osteoporosis. Discuss your test results, treatment options, and if necessary, the need for more tests with your health care provider. Vaccines  Your health care provider may recommend certain vaccines, such as:  Influenza vaccine. This is recommended every year.  Tetanus, diphtheria, and acellular pertussis (Tdap, Td) vaccine. You may need a Td booster every 10 years.  Varicella vaccine. You may need this if you have not been vaccinated.  Zoster vaccine. You may need this  after age 48.  Measles, mumps, and rubella (MMR) vaccine. You may need at least one dose of MMR if you were born in 1957 or later. You may also need a second dose.  Pneumococcal 13-valent conjugate (PCV13) vaccine. You may need this if you have certain conditions and were not previously vaccinated.  Pneumococcal polysaccharide (PPSV23) vaccine. You may need one or two doses if you smoke cigarettes or if  you have certain conditions.  Meningococcal vaccine. You may need this if you have certain conditions.  Hepatitis A vaccine. You may need this if you have certain conditions or if you travel or work in places where you may be exposed to hepatitis A.  Hepatitis B vaccine. You may need this if you have certain conditions or if you travel or work in places where you may be exposed to hepatitis B.  Haemophilus influenzae type b (Hib) vaccine. You may need this if you have certain conditions. Talk to your health care provider about which screenings and vaccines you need and how often you need them. This information is not intended to replace advice given to you by your health care provider. Make sure you discuss any questions you have with your health care provider. Document Released: 03/14/2015 Document Revised: 11/05/2015 Document Reviewed: 12/17/2014 Elsevier Interactive Patient Education  2017 [employer].

## 2016-06-29 NOTE — Assessment & Plan Note (Signed)
Check level today 

## 2016-06-29 NOTE — Assessment & Plan Note (Addendum)
Patient encouraged to maintain heart healthy diet, regular exercise, adequate sleep. Consider daily probiotics. Take medications as prescribed. Given Tdap and Prevnar today. Is leaving to visit her brand new grandson

## 2016-12-28 ENCOUNTER — Ambulatory Visit (INDEPENDENT_AMBULATORY_CARE_PROVIDER_SITE_OTHER): Payer: Medicare Other | Admitting: Family Medicine

## 2016-12-28 VITALS — BP 98/60 | HR 76 | Temp 97.9°F | Resp 18 | Wt 121.4 lb

## 2016-12-28 DIAGNOSIS — M8580 Other specified disorders of bone density and structure, unspecified site: Secondary | ICD-10-CM | POA: Diagnosis not present

## 2016-12-28 DIAGNOSIS — R059 Cough, unspecified: Secondary | ICD-10-CM

## 2016-12-28 DIAGNOSIS — N390 Urinary tract infection, site not specified: Secondary | ICD-10-CM

## 2016-12-28 DIAGNOSIS — Z8639 Personal history of other endocrine, nutritional and metabolic disease: Secondary | ICD-10-CM | POA: Diagnosis not present

## 2016-12-28 DIAGNOSIS — E559 Vitamin D deficiency, unspecified: Secondary | ICD-10-CM

## 2016-12-28 DIAGNOSIS — E785 Hyperlipidemia, unspecified: Secondary | ICD-10-CM

## 2016-12-28 DIAGNOSIS — K5900 Constipation, unspecified: Secondary | ICD-10-CM

## 2016-12-28 DIAGNOSIS — R0600 Dyspnea, unspecified: Secondary | ICD-10-CM

## 2016-12-28 DIAGNOSIS — Z0000 Encounter for general adult medical examination without abnormal findings: Secondary | ICD-10-CM | POA: Diagnosis not present

## 2016-12-28 DIAGNOSIS — Z87891 Personal history of nicotine dependence: Secondary | ICD-10-CM

## 2016-12-28 DIAGNOSIS — R053 Chronic cough: Secondary | ICD-10-CM

## 2016-12-28 DIAGNOSIS — R05 Cough: Secondary | ICD-10-CM

## 2016-12-28 MED ORDER — BUDESONIDE-FORMOTEROL FUMARATE 80-4.5 MCG/ACT IN AERO: 2.0000 | INHALATION_SPRAY | Freq: Two times a day (BID) | RESPIRATORY_TRACT | 1 refills | Status: DC

## 2016-12-28 MED ORDER — ALBUTEROL SULFATE HFA 108 (90 BASE) MCG/ACT IN AERS
2.0000 | INHALATION_SPRAY | Freq: Four times a day (QID) | RESPIRATORY_TRACT | 2 refills | Status: DC | PRN
Start: 2016-12-28 — End: 2017-06-14

## 2016-12-28 NOTE — Assessment & Plan Note (Signed)
Encouraged to get adequate exercise, calcium and vitamin d intake 

## 2016-12-28 NOTE — Assessment & Plan Note (Signed)
Improving check CBC

## 2016-12-28 NOTE — Assessment & Plan Note (Signed)
Encouraged increased hydration and fiber in diet. Daily probiotics. If bowels not moving can use MOM 2 tbls po in 4 oz of warm prune juice by mouth every 2-3 days. If no results then repeat in 4 hours with  Dulcolax suppository pr, may repeat again in 4 more hours as needed. Seek care if symptoms worsen. Consider daily Miralax and/or Dulcolax if symptoms persist.  

## 2016-12-28 NOTE — Progress Notes (Signed)
Subjective:  I acted as a Education administrator for Dr. Charlett Blake. [place], [place]  Patient ID: Danielle Fox, female    DOB: 1950-04-14, 66 y.o.   MRN: [id number]  No chief complaint on file.   HPI  Patient is in today for a welcome to medicare visit. Patient has no acute concerns. Patient denies any difficulties at home. No trouble with ADLs, depression or falls. See EMR for functional status screen and depression screen. No recent changes to vision or hearing. Is UTD with immunizations. Is UTD with screening. Discussed Advanced Directives. Encouraged heart healthy diet, exercise as tolerated and adequate sleep. See patient's problem list for health risk factors to monitor. See AVS for preventative healthcare recommendation schedule. She notes her cough is improved and non productive when it occurs. No recent febrile illness or hospitalizations. Notes some stress urinary incontinence at times but no dysuria or hematuria. Denies CP/palp/SOB/HA/congestion/fevers/GI c/o. Taking meds as prescribed   Patient Care Team: Mosie Lukes, MD as PCP - General (Family Medicine)   Past Medical History:  Diagnosis Date  . Allergy   . Colon polyps   . Constipation 10/06/2014  . Osteopenia   . 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 Preventative health care 06/29/2016  . Pruritus 06/29/2016  . Welcome to Medicare preventive visit 06/29/2016    Past Surgical History:  Procedure Laterality Date  . ABDOMINAL HYSTERECTOMY    . APPENDECTOMY    . COLONOSCOPY     01-14-2005  . SINUS IRRIGATION  04/02/2015    Family History  Problem Relation Age of Onset  . Heart disease Mother        cabg, [place]  . Alcohol abuse Father   . Cirrhosis Father   . Heart disease Father        [place]  . Colon cancer Neg Hx   . Colon polyps Neg Hx   . Esophageal cancer Neg Hx   . Stomach cancer Neg Hx   . Rectal cancer Neg Hx     Social History   Socioeconomic History  . Marital status: Single    Spouse name: Not on file  . Number  of children: Not on file  . Years of education: Not on file  . Highest education level: Not on file  Social Needs  . Financial resource strain: Not on file  . Food insecurity - worry: Not on file  . Food insecurity - inability: Not on file  . Transportation needs - medical: Not on file  . Transportation needs - non-medical: Not on file  Occupational History  . 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[employer]    Employer: [employer]  Tobacco Use  . Smoking status: Former Smoker    Packs/day: 0.50    Years: 40.00    Pack years: 20.00    Types: Cigarettes    Last attempt to quit: 02/19/2014    Years since quitting: 2.8  . Smokeless tobacco: Never Used  Substance and Sexual Activity  . Alcohol use: Yes    Alcohol/week: 0.0 oz    Comment: socially  . Drug use: No  . Sexual activity: Yes    Partners: Male  Other Topics Concern  . Not on file  Social History Narrative   Exercise-- walks dog on occassion    Outpatient Medications Prior to Visit  Medication Sig Dispense Refill  . mometasone-formoterol (DULERA) 100-5 MCG/ACT AERO Inhale 2 puffs into the lungs 2 (two) times daily. 39 Inhaler 3   No facility-administered  medications prior to visit.     Allergies  Allergen Reactions  . Erythromycin Base     Other reaction(s): GI Upset (intolerance)    Review of Systems  Constitutional: Negative for fever and malaise/fatigue.  HENT: Negative for congestion.   Eyes: Negative for blurred vision.  Respiratory: Positive for cough. Negative for shortness of breath.   Cardiovascular: Negative for chest pain, palpitations and leg swelling.  Gastrointestinal: Negative for vomiting.  Musculoskeletal: Negative for back pain.  Skin: Negative for rash.  Neurological: Negative for loss of consciousness and headaches.       Objective:    Physical Exam  Constitutional: She is oriented to person, place, and time. She appears well-developed and well-nourished. No distress.  HENT:  Head: Normocephalic and  atraumatic.  Eyes: Conjunctivae are normal.  Neck: Normal range of motion. No thyromegaly present.  Cardiovascular: Normal rate and regular rhythm.   Pulmonary/Chest: Effort normal and breath sounds normal. She has no wheezes.  Abdominal: Soft. Bowel sounds are normal. There is no tenderness.  Musculoskeletal: Normal range of motion. She exhibits no edema or deformity.  Neurological: She is alert and oriented to person, place, and time.  Skin: Skin is warm and dry. She is not diaphoretic.  Psychiatric: She has a normal mood and affect.    BP 98/60 (BP Location: Left Arm, Patient Position: Sitting, Cuff Size: Normal)   Pulse 76   Temp 97.9 F (36.6 C) (Oral)   Resp 18   Wt 121 lb 6.4 oz (55.1 kg)   SpO2 96%   BMI 22.20 kg/m  Wt Readings from Last 3 Encounters:  12/28/16 121 lb 6.4 oz (55.1 kg)  06/29/16 122 lb 3.2 oz (55.4 kg)  12/23/15 125 lb 4 oz (56.8 kg)   BP Readings from Last 3 Encounters:  12/28/16 98/60  06/29/16 110/62  12/23/15 102/68     Immunization History  Administered Date(s) Administered  . Influenza Split 01/07/2011  . Influenza Whole 11/29/2009  . Influenza,inj,Quad PF,6+ Mos 12/14/2012  . Influenza-Unspecified 12/28/2013, 12/29/2014, 12/01/2015, 12/25/2016  . Pneumococcal Conjugate-13 06/29/2016  . Tdap 03/01/2001, 06/29/2016    Health Maintenance  Topic Date Due  . Hepatitis C Screening  Mar 27, 1950  . PNA vac Low Risk Adult (2 of 2 - PPSV23) 06/29/2017  . MAMMOGRAM  01/05/2018  . COLONOSCOPY  01/30/2025  . TETANUS/TDAP  06/30/2026  . INFLUENZA VACCINE  Completed  . DEXA SCAN  Completed    Lab Results  Component Value Date   WBC 6.9 06/29/2016   HGB 14.6 06/29/2016   HCT 44.4 06/29/2016   PLT 272.0 06/29/2016   GLUCOSE 97 06/29/2016   CHOL 245 (H) 06/29/2016   TRIG 112.0 06/29/2016   HDL 68.30 06/29/2016   LDLDIRECT 155.9 12/14/2012   LDLCALC 155 (H) 06/29/2016   ALT 19 06/29/2016   AST 22 06/29/2016   NA 138 06/29/2016   K 3.9  06/29/2016   CL 103 06/29/2016   CREATININE 0.95 06/29/2016   BUN 19 06/29/2016   CO2 29 06/29/2016   TSH 2.82 06/29/2016    Lab Results  Component Value Date   TSH 2.82 06/29/2016   Lab Results  Component Value Date   WBC 6.9 06/29/2016   HGB 14.6 06/29/2016   HCT 44.4 06/29/2016   MCV 88.3 06/29/2016   PLT 272.0 06/29/2016   Lab Results  Component Value Date   NA 138 06/29/2016   K 3.9 06/29/2016   CO2 29 06/29/2016   GLUCOSE 97 06/29/2016  BUN 19 06/29/2016   CREATININE 0.95 06/29/2016   BILITOT 0.8 06/29/2016   ALKPHOS 94 06/29/2016   AST 22 06/29/2016   ALT 19 06/29/2016   PROT 7.6 06/29/2016   ALBUMIN 4.3 06/29/2016   CALCIUM 9.6 06/29/2016   GFR 62.56 06/29/2016   Lab Results  Component Value Date   CHOL 245 (H) 06/29/2016   Lab Results  Component Value Date   HDL 68.30 06/29/2016   Lab Results  Component Value Date   LDLCALC 155 (H) 06/29/2016   Lab Results  Component Value Date   TRIG 112.0 06/29/2016   Lab Results  Component Value Date   CHOLHDL 4 06/29/2016   No results found for: HGBA1C       Assessment & Plan:   Problem List Items Addressed This Visit    Osteopenia    Encouraged to get adequate exercise, calcium and vitamin d intake      Relevant Orders   Comprehensive metabolic panel   History of smoking    Will proceed with low dose CT scan program for surveillance. She is aware this is an annual screening.       Hyperlipidemia, mild    Encouraged heart healthy diet, increase exercise, avoid trans fats, consider a krill oil cap daily      Relevant Orders   Lipid panel   EKG 12-Lead (Completed)   RESOLVED: History of vitamin D deficiency    Check lab takes infrequently      Cough    Improving check CBC      Relevant Orders   CBC with Differential/Platelet   TSH   EKG 12-Lead (Completed)   DG Chest 2 View (Completed)   Chronic cough    Improved significantly since quitting smoking 3 years ago. She will  report any changes.       Dyspnea    EKG non diagnostic with NSR at 76 bpm and no acute changes. Encouraged to increase activity as tolerated and report worsening symptoms      Relevant Orders   DG Chest 2 View (Completed)   Chronic UTI    Notes some incontinence is offered a referral to urology will let us know if she wants one. Encouraged Kegel exercises. Sets of 10 bid for now.       Constipation    Encouraged increased hydration and fiber in diet. Daily probiotics. If bowels not moving can use MOM 2 tbls po in 4 oz of warm prune juice by mouth every 2-3 days. If no results then repeat in 4 hours with  Dulcolax suppository pr, may repeat again in 4 more hours as needed. Seek care if symptoms worsen. Consider daily Miralax and/or Dulcolax if symptoms persist.       Relevant Orders   Comprehensive metabolic panel   TSH   EKG 12-Lead (Completed)   Welcome to Medicare preventive visit    Patient denies any difficulties at home. No trouble with ADLs, depression or falls. See EMR for functional status screen and depression screen. No recent changes to vision or hearing. Is UTD with immunizations. Is UTD with screening. Discussed Advanced Directives. Encouraged heart healthy diet, exercise as tolerated and adequate sleep. See patient's problem list for health risk factors to monitor. See AVS for preventative healthcare recommendation schedule.Labs reviewed. EKG unremarkable      Relevant Orders   EKG 12-Lead (Completed)   Vitamin D deficiency - Primary    Ordered Vitamin D levl. Encouraged daily supplements.  Relevant Orders   VITAMIN D 25 Hydroxy (Vit-D Deficiency, Fractures)      I have discontinued Timara M. Antos's mometasone-formoterol. I am also having her start on albuterol and budesonide-formoterol.  Meds ordered this encounter  Medications  . albuterol (PROVENTIL HFA;VENTOLIN HFA) 108 (90 Base) MCG/ACT inhaler    Sig: Inhale 2 puffs into the lungs every 6 (six)  hours as needed for wheezing or shortness of breath.    Dispense:  1 Inhaler    Refill:  2  . budesonide-formoterol (SYMBICORT) 80-4.5 MCG/ACT inhaler    Sig: Inhale 2 puffs into the lungs 2 (two) times daily.    Dispense:  1 Inhaler    Refill:  1    CMA served as scribe during this visit. History, Physical and Plan performed by medical provider. Documentation and orders reviewed and attested to.  Penni Homans, MD

## 2016-12-28 NOTE — Assessment & Plan Note (Addendum)
EKG non diagnostic with NSR at 76 bpm and no acute changes. Encouraged to increase activity as tolerated and report worsening symptoms

## 2016-12-28 NOTE — Assessment & Plan Note (Signed)
Encouraged heart healthy diet, increase exercise, avoid trans fats, consider a krill oil cap daily 

## 2016-12-28 NOTE — Assessment & Plan Note (Signed)
Check lab takes infrequently

## 2016-12-28 NOTE — Patient Instructions (Addendum)
Shingrix is the new shingles 2 shots over 6 months, at [place] 65 Years and Older, Female Preventive care refers to lifestyle choices and visits with your health care provider that can promote health and wellness. What does preventive care include?  A yearly physical exam. This is also called an annual well check.  Dental exams once or twice a year.  Routine eye exams. Ask your health care provider how often you should have your eyes checked.  Personal lifestyle choices, including: ? Daily care of your teeth and gums. ? Regular physical activity. ? Eating a healthy diet. ? Avoiding tobacco and drug use. ? Limiting alcohol use. ? Practicing safe sex. ? Taking low-dose aspirin every day. ? Taking vitamin and mineral supplements as recommended by your health care provider. What happens during an annual well check? The services and screenings done by your health care provider during your annual well check will depend on your age, overall health, lifestyle risk factors, and family history of disease. Counseling Your health care provider may ask you questions about your:  Alcohol use.  Tobacco use.  Drug use.  Emotional well-being.  Home and relationship well-being.  Sexual activity.  Eating habits.  History of falls.  Memory and ability to understand (cognition).  Work and work Statistician.  Reproductive health.  Screening You may have the following tests or measurements:  Height, weight, and BMI.  Blood pressure.  Lipid and cholesterol levels. These may be checked every 5 years, or more frequently if you are over 37 years old.  Skin check.  Lung cancer screening. You may have this screening every year starting at age 61 if you have a 30-pack-year history of smoking and currently smoke or have quit within the past 15 years.  Fecal occult blood test (FOBT) of the stool. You may have this test every year starting at age 106.  Flexible  sigmoidoscopy or colonoscopy. You may have a sigmoidoscopy every 5 years or a colonoscopy every 10 years starting at age 30.  Hepatitis C blood test.  Hepatitis B blood test.  Sexually transmitted disease (STD) testing.  Diabetes screening. This is done by checking your blood sugar (glucose) after you have not eaten for a while (fasting). You may have this done every 1-3 years.  Bone density scan. This is done to screen for osteoporosis. You may have this done starting at age 56.  Mammogram. This may be done every 1-2 years. Talk to your health care provider about how often you should have regular mammograms.  Talk with your health care provider about your test results, treatment options, and if necessary, the need for more tests. Vaccines Your health care provider may recommend certain vaccines, such as:  Influenza vaccine. This is recommended every year.  Tetanus, diphtheria, and acellular pertussis (Tdap, Td) vaccine. You may need a Td booster every 10 years.  Varicella vaccine. You may need this if you have not been vaccinated.  Zoster vaccine. You may need this after age 22.  Measles, mumps, and rubella (MMR) vaccine. You may need at least one dose of MMR if you were born in 1957 or later. You may also need a second dose.  Pneumococcal 13-valent conjugate (PCV13) vaccine. One dose is recommended after age 81.  Pneumococcal polysaccharide (PPSV23) vaccine. One dose is recommended after age 79.  Meningococcal vaccine. You may need this if you have certain conditions.  Hepatitis A vaccine. You may need this if you have certain conditions or if  you travel or work in places where you may be exposed to hepatitis A.  Hepatitis B vaccine. You may need this if you have certain conditions or if you travel or work in places where you may be exposed to hepatitis B.  Haemophilus influenzae type b (Hib) vaccine. You may need this if you have certain conditions.  Talk to your health  care provider about which screenings and vaccines you need and how often you need them. This information is not intended to replace advice given to you by your health care provider. Make sure you discuss any questions you have with your health care provider. Document Released: 03/14/2015 Document Revised: 11/05/2015 Document Reviewed: 12/17/2014 Elsevier Interactive Patient Education  2017 Elsevier Inc.  

## 2016-12-29 ENCOUNTER — Ambulatory Visit (HOSPITAL_BASED_OUTPATIENT_CLINIC_OR_DEPARTMENT_OTHER)
Admission: RE | Admit: 2016-12-29 | Discharge: 2016-12-29 | Disposition: A | Discharge status: Home / Self Care | Payer: Medicare Other | Source: Ambulatory Visit | Attending: Family Medicine | Admitting: Family Medicine

## 2016-12-29 DIAGNOSIS — R918 Other nonspecific abnormal finding of lung field: Secondary | ICD-10-CM | POA: Insufficient documentation

## 2016-12-29 DIAGNOSIS — R05 Cough: Secondary | ICD-10-CM

## 2016-12-29 DIAGNOSIS — R059 Cough, unspecified: Secondary | ICD-10-CM

## 2016-12-29 DIAGNOSIS — R0600 Dyspnea, unspecified: Secondary | ICD-10-CM | POA: Diagnosis present

## 2016-12-30 ENCOUNTER — Other Ambulatory Visit: Payer: Self-pay | Admitting: Family Medicine

## 2016-12-30 ENCOUNTER — Ambulatory Visit: Payer: BLUE CROSS/BLUE SHIELD | Admitting: Family Medicine

## 2016-12-30 DIAGNOSIS — Z87891 Personal history of nicotine dependence: Secondary | ICD-10-CM

## 2017-01-02 ENCOUNTER — Encounter: Payer: Self-pay | Admitting: Family Medicine

## 2017-01-02 DIAGNOSIS — E559 Vitamin D deficiency, unspecified: Secondary | ICD-10-CM | POA: Insufficient documentation

## 2017-01-02 NOTE — Assessment & Plan Note (Signed)
Notes some incontinence is offered a referral to urology will let us know if she wants one. Encouraged Kegel exercises. Sets of 10 bid for now.

## 2017-01-02 NOTE — Assessment & Plan Note (Signed)
Ordered Vitamin D levl. Encouraged daily supplements.

## 2017-01-02 NOTE — Assessment & Plan Note (Signed)
Will proceed with low dose CT scan program for surveillance. She is aware this is an annual screening.

## 2017-01-02 NOTE — Assessment & Plan Note (Signed)
Improved significantly since quitting smoking 3 years ago. She will report any changes.

## 2017-01-02 NOTE — Assessment & Plan Note (Signed)
Patient denies any difficulties at home. No trouble with ADLs, depression or falls. See EMR for functional status screen and depression screen. No recent changes to vision or hearing. Is UTD with immunizations. Is UTD with screening. Discussed Advanced Directives. Encouraged heart healthy diet, exercise as tolerated and adequate sleep. See patient's problem list for health risk factors to monitor. See AVS for preventative healthcare recommendation schedule.Labs reviewed. EKG unremarkable

## 2017-01-04 ENCOUNTER — Other Ambulatory Visit (INDEPENDENT_AMBULATORY_CARE_PROVIDER_SITE_OTHER): Payer: Medicare Other

## 2017-01-04 DIAGNOSIS — E559 Vitamin D deficiency, unspecified: Secondary | ICD-10-CM | POA: Diagnosis not present

## 2017-01-04 DIAGNOSIS — M8580 Other specified disorders of bone density and structure, unspecified site: Secondary | ICD-10-CM

## 2017-01-04 DIAGNOSIS — K5900 Constipation, unspecified: Secondary | ICD-10-CM

## 2017-01-04 DIAGNOSIS — E785 Hyperlipidemia, unspecified: Secondary | ICD-10-CM | POA: Diagnosis not present

## 2017-01-04 DIAGNOSIS — R059 Cough, unspecified: Secondary | ICD-10-CM

## 2017-01-04 DIAGNOSIS — R05 Cough: Secondary | ICD-10-CM | POA: Diagnosis not present

## 2017-01-05 LAB — COMPREHENSIVE METABOLIC PANEL
ALT: 19 U/L (ref 0–35)
AST: 21 U/L (ref 0–37)
Albumin: 4.3 g/dL (ref 3.5–5.2)
Alkaline Phosphatase: 88 U/L (ref 39–117)
BUN: 14 mg/dL (ref 6–23)
CO2: 31 mEq/L (ref 19–32)
Calcium: 9.7 mg/dL (ref 8.4–10.5)
Chloride: 102 mEq/L (ref 96–112)
Creatinine, Ser: 1.05 mg/dL (ref 0.40–1.20)
GFR: 55.65 mL/min — ABNORMAL LOW (ref >60.00)
Glucose, Bld: 83 mg/dL (ref 70–99)
Potassium: 3.7 mEq/L (ref 3.5–5.1)
Sodium: 140 mEq/L (ref 135–145)
Total Bilirubin: 0.6 mg/dL (ref 0.2–1.2)
Total Protein: 7.4 g/dL (ref 6.0–8.3)

## 2017-01-05 LAB — CBC WITH DIFFERENTIAL/PLATELET
Basophils Absolute: 0.1 10*3/uL (ref 0.0–0.1)
Basophils Relative: 1.5 % (ref 0.0–3.0)
Eosinophils Absolute: 1 10*3/uL — ABNORMAL HIGH (ref 0.0–0.7)
Eosinophils Relative: 15.6 % — ABNORMAL HIGH (ref 0.0–5.0)
HCT: 43 % (ref 36.0–46.0)
Hemoglobin: 14.2 g/dL (ref 12.0–15.0)
Lymphocytes Relative: 31.1 % (ref 12.0–46.0)
Lymphs Abs: 2.1 10*3/uL (ref 0.7–4.0)
MCHC: 33 g/dL (ref 30.0–36.0)
MCV: 90.8 fl (ref 78.0–100.0)
Monocytes Absolute: 0.7 10*3/uL (ref 0.1–1.0)
Monocytes Relative: 10.5 % (ref 3.0–12.0)
Neutro Abs: 2.7 10*3/uL (ref 1.4–7.7)
Neutrophils Relative %: 41.3 % — ABNORMAL LOW (ref 43.0–77.0)
Platelets: 244 10*3/uL (ref 150.0–400.0)
RBC: 4.73 Mil/uL (ref 3.87–5.11)
RDW: 13.9 % (ref 11.5–15.5)
WBC: 6.6 10*3/uL (ref 4.0–10.5)

## 2017-01-05 LAB — LIPID PANEL
Cholesterol: 230 mg/dL — ABNORMAL HIGH (ref 0–200)
HDL: 70.5 mg/dL (ref >39.00)
LDL Cholesterol: 134 mg/dL — ABNORMAL HIGH (ref 0–99)
NonHDL: 159.23
Total CHOL/HDL Ratio: 3
Triglycerides: 127 mg/dL (ref 0.0–149.0)
VLDL: 25.4 mg/dL (ref 0.0–40.0)

## 2017-01-05 LAB — TSH: TSH: 1.9 u[IU]/mL (ref 0.35–4.50)

## 2017-01-05 LAB — VITAMIN D 25 HYDROXY (VIT D DEFICIENCY, FRACTURES): VITD: 19.12 ng/mL — ABNORMAL LOW (ref 30.00–100.00)

## 2017-01-07 MED ORDER — VITAMIN D (ERGOCALCIFEROL) 1.25 MG (50000 UNIT) PO CAPS: 50000.0000 [IU] | ORAL_CAPSULE | ORAL | 4 refills | Status: DC

## 2017-01-07 NOTE — Addendum Note (Signed)
Addended by: Magdalene Molly A on: 01/07/2017 10:17 AM   Modules accepted: Orders

## 2017-01-11 ENCOUNTER — Other Ambulatory Visit: Payer: Self-pay | Admitting: Acute Care

## 2017-01-11 DIAGNOSIS — Z122 Encounter for screening for malignant neoplasm of respiratory organs: Secondary | ICD-10-CM

## 2017-01-11 DIAGNOSIS — Z87891 Personal history of nicotine dependence: Secondary | ICD-10-CM

## 2017-01-17 ENCOUNTER — Ambulatory Visit (INDEPENDENT_AMBULATORY_CARE_PROVIDER_SITE_OTHER)
Admission: RE | Admit: 2017-01-17 | Discharge: 2017-01-17 | Disposition: A | Discharge status: Home / Self Care | Payer: Medicare Other | Source: Ambulatory Visit | Attending: Acute Care | Admitting: Acute Care

## 2017-01-17 ENCOUNTER — Encounter: Payer: Self-pay | Admitting: Acute Care

## 2017-01-17 ENCOUNTER — Ambulatory Visit (INDEPENDENT_AMBULATORY_CARE_PROVIDER_SITE_OTHER): Payer: Medicare Other | Admitting: Acute Care

## 2017-01-17 DIAGNOSIS — Z87891 Personal history of nicotine dependence: Secondary | ICD-10-CM

## 2017-01-17 DIAGNOSIS — Z122 Encounter for screening for malignant neoplasm of respiratory organs: Secondary | ICD-10-CM

## 2017-01-17 NOTE — Progress Notes (Signed)
Shared Decision Making Visit Lung Cancer Screening Program [phone])   Eligibility:  Age 66 y.o.  Pack Years Smoking History Calculation 48 pack year smoking history (# packs/per year x # years smoked)  Recent History of coughing up blood  no  Unexplained weight loss? no ( >Than 15 pounds within the last 6 months )  Prior History Lung / other cancer no (Diagnosis within the last 5 years already requiring surveillance chest CT Scans).  Smoking Status Former Smoker  Former Smokers: Years since quit: 3 years  Quit Date: 2015  Visit Components:  Discussion included one or more decision making aids. yes  Discussion included risk/benefits of screening. yes  Discussion included potential follow up diagnostic testing for abnormal scans. yes  Discussion included meaning and risk of over diagnosis. yes  Discussion included meaning and risk of False Positives. yes  Discussion included meaning of total radiation exposure. yes  Counseling Included:  Importance of adherence to annual lung cancer LDCT screening. yes  Impact of comorbidities on ability to participate in the program. yes  Ability and willingness to under diagnostic treatment. yes  Smoking Cessation Counseling:  Current Smokers:   Discussed importance of smoking cessation. No, former smoker  Information about tobacco cessation classes and interventions provided to patient. yes  Patient provided with "ticket" for LDCT Scan. yes  Symptomatic Patient. no  Counseling  Diagnosis Code: Tobacco Use Z72.0  Asymptomatic Patient yes  Counseling (Intermediate counseling: > three minutes counseling) J1941  Former Smokers:   Discussed the importance of maintaining cigarette abstinence. yes  Diagnosis Code: Personal History of Nicotine Dependence. 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 Yes  Patient provided with "ticket" for LDCT Scan. yes  Written Order for  Lung Cancer Screening with LDCT placed in Epic. Yes (CT Chest Lung Cancer Screening Low Dose W/O CM) [id number] Z12.2-Screening of respiratory organs Z87.891-Personal history of nicotine dependence  I spent 25 minutes of face to face time with Danielle Fox discussing the risks and benefits of lung cancer screening. We viewed a power point together that explained in detail the above noted topics. We took the time to pause the power point at intervals to allow for questions to be asked and answered to ensure understanding. We discussed that she  had taken the single most powerful action possible to decrease her risk of developing lung cancer when she quit smoking. She quit cold [place]. I counseled her to remain smoke free, and to contact me if she ever had the desire to smoke again so that I can provide resources and tools to help support the effort to remain smoke free. We discussed the time and location of the scan, and that either  Doroteo Glassman RN or I will call with the results within  24-48 hours of receiving them. She  has my card and contact information in the event she needs to speak with me, in addition to a copy of the power point we reviewed as a resource. She  verbalized understanding of all of the above and had no further questions upon leaving the office.     I explained to the patient that there has been a high incidence of coronary artery disease noted on these exams. I explained that this is a non-gated exam therefore degree or severity cannot be determined. This patient is not on statin therapy per Epic.. I have asked the patient to follow-up with their PCP regarding any incidental finding of coronary artery disease  and management with diet or medication as they feel is clinically indicated. The patient verbalized understanding of the above and had no further questions.     Magdalen Spatz, NP 01/17/2017

## 2017-01-24 ENCOUNTER — Other Ambulatory Visit: Payer: Self-pay | Admitting: Acute Care

## 2017-01-24 DIAGNOSIS — Z87891 Personal history of nicotine dependence: Secondary | ICD-10-CM

## 2017-01-24 DIAGNOSIS — Z122 Encounter for screening for malignant neoplasm of respiratory organs: Secondary | ICD-10-CM

## 2017-03-07 ENCOUNTER — Telehealth: Payer: Self-pay | Admitting: Family Medicine

## 2017-03-07 NOTE — Telephone Encounter (Signed)
Copied from [place] [id number]. Topic: Quick Communication - See Telephone Encounter >> Mar 07, 2017 12:48 PM Synthia Innocent wrote: CRM for notification. See Telephone encounter for:  Patient needs PA on dulera, patient states she now has [employer] drug plan 03/07/17.

## 2017-03-07 NOTE — Telephone Encounter (Signed)
Pt called needing PA on Dulera- however that medication was d/c in 11/2016 and Symbicort prescribed to replace it.

## 2017-06-14 ENCOUNTER — Ambulatory Visit: Payer: Medicare Other | Admitting: Family Medicine

## 2017-06-14 ENCOUNTER — Encounter: Payer: Self-pay | Admitting: Family Medicine

## 2017-06-14 ENCOUNTER — Ambulatory Visit (INDEPENDENT_AMBULATORY_CARE_PROVIDER_SITE_OTHER): Payer: Medicare HMO | Admitting: Family Medicine

## 2017-06-14 VITALS — BP 100/64 | HR 86 | Temp 97.7°F | Resp 18 | Wt 121.0 lb

## 2017-06-14 DIAGNOSIS — E559 Vitamin D deficiency, unspecified: Secondary | ICD-10-CM | POA: Diagnosis not present

## 2017-06-14 DIAGNOSIS — Z87891 Personal history of nicotine dependence: Secondary | ICD-10-CM | POA: Diagnosis not present

## 2017-06-14 DIAGNOSIS — R9389 Abnormal findings on diagnostic imaging of other specified body structures: Secondary | ICD-10-CM

## 2017-06-14 DIAGNOSIS — E785 Hyperlipidemia, unspecified: Secondary | ICD-10-CM

## 2017-06-14 MED ORDER — MOMETASONE FURO-FORMOTEROL FUM 100-5 MCG/ACT IN AERO: 2.0000 | INHALATION_SPRAY | Freq: Every day | RESPIRATORY_TRACT | 2 refills | Status: DC

## 2017-06-14 MED ORDER — BUDESONIDE-FORMOTEROL FUMARATE 80-4.5 MCG/ACT IN AERO: 2.0000 | INHALATION_SPRAY | Freq: Two times a day (BID) | RESPIRATORY_TRACT | 1 refills | Status: DC

## 2017-06-14 MED ORDER — ALBUTEROL SULFATE HFA 108 (90 BASE) MCG/ACT IN AERS: 2.0000 | INHALATION_SPRAY | Freq: Four times a day (QID) | RESPIRATORY_TRACT | 2 refills | Status: DC | PRN

## 2017-06-14 NOTE — Progress Notes (Signed)
Subjective:  I acted as a Education administrator for Dr. Charlett Blake. [place], [place]  Patient ID: Danielle Fox, female    DOB: [birth year], 67 y.o.   MRN: [id number]  No chief complaint on file.   HPI  Patient is in today for a 6 month follow up and is doing well. No recent febrile illness or hospitalizations. Is trying to stay active and maintain a heart healthy diet. Denies CP/palp/SOB/HA/congestion/fevers/GI or GU c/o. Taking meds as prescribed  Patient Care Team: Mosie Lukes, MD as PCP - General (Family Medicine)   Past Medical History:  Diagnosis Date  . Allergy   . Colon polyps   . Constipation 10/06/2014  . Osteopenia   . 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 Preventative health care 06/29/2016  . Pruritus 06/29/2016  . Welcome to Medicare preventive visit 06/29/2016    Past Surgical History:  Procedure Laterality Date  . ABDOMINAL HYSTERECTOMY    . APPENDECTOMY    . COLONOSCOPY     01-14-2005  . SINUS IRRIGATION  04/02/2015    Family History  Problem Relation Age of Onset  . Heart disease Mother        cabg, MI  . Alcohol abuse Father   . Cirrhosis Father   . Heart disease Father        MI  . Colon cancer Neg Hx   . Colon polyps Neg Hx   . Esophageal cancer Neg Hx   . Stomach cancer Neg Hx   . Rectal cancer Neg Hx     Social History   Socioeconomic History  . Marital status: Single    Spouse name: Not on file  . Number of children: Not on file  . Years of education: Not on file  . Highest education level: Not on file  Occupational History  . 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[employer]    Employer: [employer]  . Financial resource strain: Not on file  . Food insecurity:    Worry: Not on file    Inability: Not on file  . Transportation needs:    Medical: Not on file    Non-medical: Not on file  Tobacco Use  . Smoking status: Former Smoker    Packs/day: 1.00    Years: 48.00    Pack years: 48.00    Types: Cigarettes    Last attempt to quit: 02/19/2014    Years  since quitting: 3.3  . Smokeless tobacco: Never Used  . Tobacco comment: Quit and has no desire to smoke again,  Substance and Sexual Activity  . Alcohol use: Yes    Alcohol/week: 0.0 oz    Comment: socially  . Drug use: No  . Sexual activity: Yes    Partners: Male  Lifestyle  . Physical activity:    Days per week: Not on file    Minutes per session: Not on file  . Stress: Not on file  Relationships  . Social connections:    Talks on phone: Not on file    Gets together: Not on file    Attends religious service: Not on file    Active member of club or organization: Not on file    Attends meetings of clubs or organizations: Not on file    Relationship status: Not on file  . Intimate partner violence:    Fear of current or ex partner: Not on file    Emotionally abused: Not on file    Physically abused: Not  on file    Forced sexual activity: Not on file  Other Topics Concern  . Not on file  Social History Narrative   Exercise-- walks dog on occassion    Outpatient Medications Prior to Visit  Medication Sig Dispense Refill  . Vitamin D, Ergocalciferol, (DRISDOL) 50000 units CAPS capsule Take 1 capsule (50,000 Units total) every 7 (seven) days by mouth. 4 capsule 4  . albuterol (PROVENTIL HFA;VENTOLIN HFA) 108 (90 Base) MCG/ACT inhaler Inhale 2 puffs into the lungs every 6 (six) hours as needed for wheezing or shortness of breath. 1 Inhaler 2  . budesonide-formoterol (SYMBICORT) 80-4.5 MCG/ACT inhaler Inhale 2 puffs into the lungs 2 (two) times daily. 1 Inhaler 1   No facility-administered medications prior to visit.     Allergies  Allergen Reactions  . Erythromycin Base     Other reaction(s): GI Upset (intolerance)    Review of Systems  Constitutional: Negative for fever and malaise/fatigue.  HENT: Negative for congestion.   Eyes: Negative for blurred vision.  Respiratory: Negative for shortness of breath.   Cardiovascular: Negative for chest pain, palpitations and  leg swelling.  Gastrointestinal: Negative for abdominal pain, blood in stool and nausea.  Genitourinary: Negative for dysuria and frequency.  Musculoskeletal: Negative for falls.  Skin: Negative for rash.  Neurological: Negative for dizziness, loss of consciousness and headaches.  Endo/Heme/Allergies: Negative for environmental allergies.  Psychiatric/Behavioral: Negative for depression. The patient is not nervous/anxious.        Objective:    Physical Exam  Constitutional: Danielle Fox is oriented to person, place, and time. Danielle Fox appears well-developed and well-nourished. No distress.  HENT:  Head: Normocephalic and atraumatic.  Nose: Nose normal.  Eyes: Right eye exhibits no discharge. Left eye exhibits no discharge.  Neck: Normal range of motion. Neck supple.  Cardiovascular: Normal rate and regular rhythm.  No murmur heard. Pulmonary/Chest: Effort normal and breath sounds normal.  Abdominal: Soft. Bowel sounds are normal. There is no tenderness.  Musculoskeletal: Danielle Fox exhibits no edema.  Neurological: Danielle Fox is alert and oriented to person, place, and time.  Skin: Skin is warm and dry.  Psychiatric: Danielle Fox has a normal mood and affect.  Nursing note and vitals reviewed.   BP 100/64 (BP Location: Left Arm, Patient Position: Sitting, Cuff Size: Normal)   Pulse 86   Temp 97.7 F (36.5 C) (Oral)   Resp 18   Wt 121 lb (54.9 kg)   SpO2 95%   BMI 22.13 kg/m  Wt Readings from Last 3 Encounters:  06/14/17 121 lb (54.9 kg)  12/28/16 121 lb 6.4 oz (55.1 kg)  06/29/16 122 lb 3.2 oz (55.4 kg)   BP Readings from Last 3 Encounters:  06/14/17 100/64  12/28/16 98/60  06/29/16 110/62     Immunization History  Administered Date(s) Administered  . Influenza Split 01/07/2011  . Influenza Whole 11/29/2009  . Influenza,inj,Quad PF,6+ Mos 12/14/2012  . Influenza-Unspecified 12/28/2013, 12/29/2014, 12/01/2015, 12/25/2016  . Pneumococcal Conjugate-13 06/29/2016  . Tdap 03/01/2001, 06/29/2016      Health Maintenance  Topic Date Due  . Hepatitis C Screening  1950/11/05  . PNA vac Low Risk Adult (2 of 2 - PPSV23) 06/29/2017  . INFLUENZA VACCINE  09/29/2017  . MAMMOGRAM  01/05/2018  . COLONOSCOPY  01/30/2025  . TETANUS/TDAP  06/30/2026  . DEXA SCAN  Completed    Lab Results  Component Value Date   WBC 6.6 01/04/2017   HGB 14.2 01/04/2017   HCT 43.0 01/04/2017   PLT 244.0 01/04/2017  GLUCOSE 89 06/14/2017   CHOL 230 (H) 01/04/2017   TRIG 127.0 01/04/2017   HDL 70.50 01/04/2017   LDLDIRECT 155.9 12/14/2012   LDLCALC 134 (H) 01/04/2017   ALT 21 06/14/2017   AST 22 06/14/2017   NA 140 06/14/2017   K 4.4 06/14/2017   CL 103 06/14/2017   CREATININE 0.99 06/14/2017   BUN 15 06/14/2017   CO2 28 06/14/2017   TSH 1.90 01/04/2017    Lab Results  Component Value Date   TSH 1.90 01/04/2017   Lab Results  Component Value Date   WBC 6.6 01/04/2017   HGB 14.2 01/04/2017   HCT 43.0 01/04/2017   MCV 90.8 01/04/2017   PLT 244.0 01/04/2017   Lab Results  Component Value Date   NA 140 06/14/2017   K 4.4 06/14/2017   CO2 28 06/14/2017   GLUCOSE 89 06/14/2017   BUN 15 06/14/2017   CREATININE 0.99 06/14/2017   BILITOT 0.6 06/14/2017   ALKPHOS 87 06/14/2017   AST 22 06/14/2017   ALT 21 06/14/2017   PROT 7.4 06/14/2017   ALBUMIN 4.3 06/14/2017   CALCIUM 9.8 06/14/2017   GFR 59.48 (L) 06/14/2017   Lab Results  Component Value Date   CHOL 230 (H) 01/04/2017   Lab Results  Component Value Date   HDL 70.50 01/04/2017   Lab Results  Component Value Date   LDLCALC 134 (H) 01/04/2017   Lab Results  Component Value Date   TRIG 127.0 01/04/2017   Lab Results  Component Value Date   CHOLHDL 3 01/04/2017   No results found for: HGBA1C       Assessment & Plan:   Problem List Items Addressed This Visit    History of smoking    Quit Dec 2016 low dose Ct scan annually      Hyperlipidemia, mild    Encouraged heart healthy diet, increase exercise, avoid  trans fats, consider a krill oil cap daily      Abnormal CT scan    Is following with pulmonology, uses low dose CT scans annually      Vitamin D deficiency - Primary    Check level today. Taking vitamin D 5000 daily      Relevant Orders   Comprehensive metabolic panel (Completed)   VITAMIN D 25 Hydroxy (Vit-D Deficiency, Fractures) (Completed)      I am having Danielle Fox start on mometasone-formoterol. I am also having Danielle Fox maintain Danielle Fox Vitamin D (Ergocalciferol), budesonide-formoterol, and albuterol.  Meds ordered this encounter  Medications  . mometasone-formoterol (DULERA) 100-5 MCG/ACT AERO    Sig: Inhale 2 puffs into the lungs daily.    Dispense:  13 g    Refill:  2  . budesonide-formoterol (SYMBICORT) 80-4.5 MCG/ACT inhaler    Sig: Inhale 2 puffs into the lungs 2 (two) times daily.    Dispense:  1 Inhaler    Refill:  1  . albuterol (PROVENTIL HFA;VENTOLIN HFA) 108 (90 Base) MCG/ACT inhaler    Sig: Inhale 2 puffs into the lungs every 6 (six) hours as needed for wheezing or shortness of breath.    Dispense:  1 Inhaler    Refill:  2    CMA served as scribe during this visit. History, Physical and Plan performed by medical provider. Documentation and orders reviewed and attested to.  Penni Homans, MD

## 2017-06-14 NOTE — Assessment & Plan Note (Signed)
Check level today. Taking vitamin D 5000 daily

## 2017-06-14 NOTE — Patient Instructions (Addendum)
Ruthe Mannan works well what similar medicine will insurance pay such as Symbicort or Advair Shingrix at pharmacy 2 shots over 2-6 months  GoodRx or CoverMyMeds  [facility], [facility] pharmacy or [facility] Cholesterol Cholesterol is a fat. Your body needs a small amount of cholesterol. Cholesterol (plaque) may build up in your blood vessels (arteries). That makes you more likely to have a heart attack or stroke. You cannot feel your cholesterol level. Having a blood test is the only way to find out if your level is high. Keep your test results. Work with your doctor to keep your cholesterol at a good level. What do the results mean?  Total cholesterol is how much cholesterol is in your blood.  LDL is bad cholesterol. This is the type that can build up. Try to have low LDL.  HDL is good cholesterol. It cleans your blood vessels and carries LDL away. Try to have high HDL.  Triglycerides are fat that the body can store or burn for energy. What are good levels of cholesterol?  Total cholesterol below 200.  LDL below 100 is good for people who have health risks. LDL below 70 is good for people who have very high risks.  HDL above 40 is good. It is best to have HDL of 60 or higher.  Triglycerides below 150. How can I lower my cholesterol? Diet Follow your diet program as told by your doctor.  Choose fish, white meat chicken, or [place] that is roasted or baked. Try not to eat red meat, fried foods, sausage, or lunch meats.  Eat lots of fresh fruits and vegetables.  Choose whole grains, beans, pasta, potatoes, and cereals.  Choose olive oil, corn oil, or canola oil. Only use small amounts.  Try not to eat butter, mayonnaise, shortening, or palm kernel oils.  Try not to eat foods with trans fats.  Choose low-fat or nonfat dairy foods. ? Drink skim or nonfat milk. ? Eat low-fat or nonfat yogurt and cheeses. ? Try not to drink whole milk or cream. ? Try not to eat ice cream, egg yolks, or full-fat  cheeses.  Healthy desserts include angel food cake, ginger snaps, animal crackers, hard candy, popsicles, and low-fat or nonfat frozen yogurt. Try not to eat pastries, cakes, pies, and cookies.  Exercise Follow your exercise program as told by your doctor.  Be more active. Try gardening, walking, and taking the stairs.  Ask your doctor about ways that you can be more active.  Medicine  Take over-the-counter and prescription medicines only as told by your doctor. This information is not intended to replace advice given to you by your health care provider. Make sure you discuss any questions you have with your health care provider. Document Released: 05/14/2008 Document Revised: 09/17/2015 Document Reviewed: 08/28/2015 Elsevier Interactive Patient Education  [employer].

## 2017-06-14 NOTE — Assessment & Plan Note (Signed)
Quit Dec 2016 low dose Ct scan annually

## 2017-06-15 LAB — COMPREHENSIVE METABOLIC PANEL
ALT: 21 U/L (ref 0–35)
AST: 22 U/L (ref 0–37)
Albumin: 4.3 g/dL (ref 3.5–5.2)
Alkaline Phosphatase: 87 U/L (ref 39–117)
BUN: 15 mg/dL (ref 6–23)
CO2: 28 mEq/L (ref 19–32)
Calcium: 9.8 mg/dL (ref 8.4–10.5)
Chloride: 103 mEq/L (ref 96–112)
Creatinine, Ser: 0.99 mg/dL (ref 0.40–1.20)
GFR: 59.48 mL/min — ABNORMAL LOW (ref >60.00)
Glucose, Bld: 89 mg/dL (ref 70–99)
Potassium: 4.4 mEq/L (ref 3.5–5.1)
Sodium: 140 mEq/L (ref 135–145)
Total Bilirubin: 0.6 mg/dL (ref 0.2–1.2)
Total Protein: 7.4 g/dL (ref 6.0–8.3)

## 2017-06-15 LAB — VITAMIN D 25 HYDROXY (VIT D DEFICIENCY, FRACTURES): VITD: 26.91 ng/mL — ABNORMAL LOW (ref 30.00–100.00)

## 2017-06-20 NOTE — Assessment & Plan Note (Signed)
Encouraged heart healthy diet, increase exercise, avoid trans fats, consider a krill oil cap daily 

## 2017-06-20 NOTE — Assessment & Plan Note (Addendum)
Is following with pulmonology, uses low dose CT scans annually

## 2017-08-13 DIAGNOSIS — J029 Acute pharyngitis, unspecified: Secondary | ICD-10-CM | POA: Diagnosis not present

## 2017-08-13 DIAGNOSIS — R05 Cough: Secondary | ICD-10-CM | POA: Diagnosis not present

## 2017-08-22 ENCOUNTER — Encounter: Payer: Self-pay | Admitting: Family Medicine

## 2017-08-22 DIAGNOSIS — J040 Acute laryngitis: Secondary | ICD-10-CM | POA: Diagnosis not present

## 2017-08-22 DIAGNOSIS — J019 Acute sinusitis, unspecified: Secondary | ICD-10-CM | POA: Diagnosis not present

## 2017-08-22 DIAGNOSIS — B9689 Other specified bacterial agents as the cause of diseases classified elsewhere: Secondary | ICD-10-CM | POA: Diagnosis not present

## 2017-08-22 DIAGNOSIS — R05 Cough: Secondary | ICD-10-CM | POA: Diagnosis not present

## 2017-08-30 ENCOUNTER — Ambulatory Visit (INDEPENDENT_AMBULATORY_CARE_PROVIDER_SITE_OTHER): Payer: Medicare HMO | Admitting: Family Medicine

## 2017-08-30 ENCOUNTER — Encounter: Payer: Self-pay | Admitting: Family Medicine

## 2017-08-30 DIAGNOSIS — K5900 Constipation, unspecified: Secondary | ICD-10-CM | POA: Diagnosis not present

## 2017-08-30 DIAGNOSIS — R05 Cough: Secondary | ICD-10-CM | POA: Diagnosis not present

## 2017-08-30 DIAGNOSIS — M8580 Other specified disorders of bone density and structure, unspecified site: Secondary | ICD-10-CM

## 2017-08-30 DIAGNOSIS — R059 Cough, unspecified: Secondary | ICD-10-CM

## 2017-08-30 DIAGNOSIS — E785 Hyperlipidemia, unspecified: Secondary | ICD-10-CM

## 2017-08-30 MED ORDER — CEFDINIR 300 MG PO CAPS: 300.0000 mg | ORAL_CAPSULE | Freq: Two times a day (BID) | ORAL | 0 refills | Status: AC

## 2017-08-30 NOTE — Progress Notes (Signed)
Pre visit review using our clinic review tool, if applicable. No additional management support is needed unless otherwise documented below in the visit note. 

## 2017-08-30 NOTE — Patient Instructions (Signed)
Encouraged increased rest and hydration, add probiotics, zinc such as Coldeze or Xicam. Treat fevers as needed.  Mucinex twice, vitamin C 500 to 1000 mg daily Elderberry syrup, Aged or black garlic and  Umcha.  Slippery Tea and/or other tea with lots of lemon and honey Cough, Adult Coughing is a reflex that clears your throat and your airways. Coughing helps to heal and protect your lungs. It is normal to cough occasionally, but a cough that happens with other symptoms or lasts a long time may be a sign of a condition that needs treatment. A cough may last only 2-3 weeks (acute), or it may last longer than 8 weeks (chronic). What are the causes? Coughing is commonly caused by:  Breathing in substances that irritate your lungs.  A viral or bacterial respiratory infection.  Allergies.  Asthma.  Postnasal drip.  Smoking.  Acid backing up from the stomach into the esophagus (gastroesophageal reflux).  Certain medicines.  Chronic lung problems, including COPD (or rarely, lung cancer).  Other medical conditions such as heart failure.  Follow these instructions at home: Pay attention to any changes in your symptoms. Take these actions to help with your discomfort:  Take medicines only as told by your health care provider. ? If you were prescribed an antibiotic medicine, take it as told by your health care provider. Do not stop taking the antibiotic even if you start to feel better. ? Talk with your health care provider before you take a cough suppressant medicine.  Drink enough fluid to keep your urine clear or pale yellow.  If the air is dry, use a cold steam vaporizer or humidifier in your bedroom or your home to help loosen secretions.  Avoid anything that causes you to cough at work or at home.  If your cough is worse at night, try sleeping in a semi-upright position.  Avoid cigarette smoke. If you smoke, quit smoking. If you need help quitting, ask your health care  provider.  Avoid caffeine.  Avoid alcohol.  Rest as needed.  Contact a health care provider if:  You have new symptoms.  You cough up pus.  Your cough does not get better after 2-3 weeks, or your cough gets worse.  You cannot control your cough with suppressant medicines and you are losing sleep.  You develop pain that is getting worse or pain that is not controlled with pain medicines.  You have a fever.  You have unexplained weight loss.  You have night sweats. Get help right away if:  You cough up blood.  You have difficulty breathing.  Your heartbeat is very fast. This information is not intended to replace advice given to you by your health care provider. Make sure you discuss any questions you have with your health care provider. Document Released: 08/14/2010 Document Revised: 07/24/2015 Document Reviewed: 04/24/2014 Elsevier Interactive Patient Education  [employer].

## 2017-09-04 NOTE — Assessment & Plan Note (Signed)
small bowel obstruction 

## 2017-09-04 NOTE — Assessment & Plan Note (Signed)
Worsened recently will treat a persistnet bronchitis with cefdinir, mucinex and probiotics.

## 2017-09-04 NOTE — Assessment & Plan Note (Signed)
Encouraged heart healthy diet, increase exercise, avoid trans fats, consider a krill oil cap daily 

## 2017-09-04 NOTE — Progress Notes (Signed)
Patient ID: Danielle Fox, female   DOB: 11-23-1950, 67 y.o.   MRN: [id number]   Subjective:    Patient ID: Danielle Fox, female    DOB: [birth year], 67 y.o.   MRN: [id number]  Chief Complaint  Patient presents with  . Cough    congestion    HPI Patient is in today for evaluation of a persistent cough.  She has recently traveled to [place] to visit her grandson and while she was there of her home was sick.  She came home with a cough and head congestion productive of green phlegm chills malaise and myalgias.  She did have a strep test which was negative and has been treated with 2 rounds of antibiotics including a course of doxycycline.  The chills have improved but the cough is persistent.  Denies CP/palp/SOB/HAevers/GI or GU c/o. Taking meds as prescribed  Past Medical History:  Diagnosis Date  . Allergy   . Colon polyps   . Constipation 10/06/2014  . Osteopenia   . 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 Preventative health care 06/29/2016  . Pruritus 06/29/2016  . Welcome to Medicare preventive visit 06/29/2016    Past Surgical History:  Procedure Laterality Date  . ABDOMINAL HYSTERECTOMY    . APPENDECTOMY    . COLONOSCOPY     01-14-2005  . SINUS IRRIGATION  04/02/2015    Family History  Problem Relation Age of Onset  . Heart disease Mother        cabg, MI  . Alcohol abuse Father   . Cirrhosis Father   . Heart disease Father        MI  . Colon cancer Neg Hx   . Colon polyps Neg Hx   . Esophageal cancer Neg Hx   . Stomach cancer Neg Hx   . Rectal cancer Neg Hx     Social History   Socioeconomic History  . Marital status: Single    Spouse name: Not on file  . Number of children: Not on file  . Years of education: Not on file  . Highest education level: Not on file  Occupational History  . 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[employer]    Employer: [employer]  . Financial resource strain: Not on file  . Food insecurity:    Worry: Not on file    Inability: Not  on file  . Transportation needs:    Medical: Not on file    Non-medical: Not on file  Tobacco Use  . Smoking status: Former Smoker    Packs/day: 1.00    Years: 48.00    Pack years: 48.00    Types: Cigarettes    Last attempt to quit: 02/19/2014    Years since quitting: 3.5  . Smokeless tobacco: Never Used  . Tobacco comment: Quit and has no desire to smoke again,  Substance and Sexual Activity  . Alcohol use: Yes    Alcohol/week: 0.0 oz    Comment: socially  . Drug use: No  . Sexual activity: Yes    Partners: Male  Lifestyle  . Physical activity:    Days per week: Not on file    Minutes per session: Not on file  . Stress: Not on file  Relationships  . Social connections:    Talks on phone: Not on file    Gets together: Not on file    Attends religious service: Not on file    Active member of club or organization:  Not on file    Attends meetings of clubs or organizations: Not on file    Relationship status: Not on file  . Intimate partner violence:    Fear of current or ex partner: Not on file    Emotionally abused: Not on file    Physically abused: Not on file    Forced sexual activity: Not on file  Other Topics Concern  . Not on file  Social History Narrative   Exercise-- walks dog on occassion    Outpatient Medications Prior to Visit  Medication Sig Dispense Refill  . albuterol (PROVENTIL HFA;VENTOLIN HFA) 108 (90 Base) MCG/ACT inhaler Inhale 2 puffs into the lungs every 6 (six) hours as needed for wheezing or shortness of breath. 1 Inhaler 2  . budesonide-formoterol (SYMBICORT) 80-4.5 MCG/ACT inhaler Inhale 2 puffs into the lungs 2 (two) times daily. 1 Inhaler 1  . mometasone-formoterol (DULERA) 100-5 MCG/ACT AERO Inhale 2 puffs into the lungs daily. 13 g 2  . Vitamin D, Ergocalciferol, (DRISDOL) 50000 units CAPS capsule Take 1 capsule (50,000 Units total) every 7 (seven) days by mouth. 4 capsule 4   No facility-administered medications prior to visit.      Allergies  Allergen Reactions  . Doxycycline Nausea And Vomiting  . Erythromycin Base     Other reaction(s): GI Upset (intolerance)    Review of Systems  Constitutional: Positive for chills and malaise/fatigue. Negative for fever.  HENT: Positive for congestion.   Eyes: Negative for blurred vision.  Respiratory: Positive for cough and sputum production. Negative for shortness of breath.   Cardiovascular: Negative for chest pain, palpitations and leg swelling.  Gastrointestinal: Negative for abdominal pain, blood in stool and nausea.  Genitourinary: Negative for dysuria and frequency.  Musculoskeletal: Negative for falls.  Skin: Negative for rash.  Neurological: Negative for dizziness, loss of consciousness and headaches.  Endo/Heme/Allergies: Negative for environmental allergies.  Psychiatric/Behavioral: Negative for depression. The patient is not nervous/anxious.        Objective:    Physical Exam  Constitutional: She is oriented to person, place, and time. No distress.  HENT:  Head: Normocephalic and atraumatic.  Nasal mucous membranes boggy and erythematous.   Eyes: Conjunctivae are normal.  Neck: Neck supple. No thyromegaly present.  Cardiovascular: Normal rate, regular rhythm and normal heart sounds.  No murmur heard. Pulmonary/Chest: Effort normal and breath sounds normal. She has no wheezes.  Abdominal: She exhibits no distension and no mass.  Musculoskeletal: She exhibits no edema.  Lymphadenopathy:    She has no cervical adenopathy.  Neurological: She is alert and oriented to person, place, and time.  Skin: Skin is warm and dry. No rash noted. She is not diaphoretic.  Psychiatric: Judgment normal.    BP 108/62 (BP Location: Left Arm, Patient Position: Sitting, Cuff Size: Normal)   Pulse 86   Temp 98.2 F (36.8 C) (Oral)   Ht 5\' 1"  (1.549 m)   Wt 120 lb 8 oz (54.7 kg)   SpO2 97%   BMI 22.77 kg/m  Wt Readings from Last 3 Encounters:  08/30/17 120 lb  8 oz (54.7 kg)  06/14/17 121 lb (54.9 kg)  12/28/16 121 lb 6.4 oz (55.1 kg)     Lab Results  Component Value Date   WBC 6.6 01/04/2017   HGB 14.2 01/04/2017   HCT 43.0 01/04/2017   PLT 244.0 01/04/2017   GLUCOSE 89 06/14/2017   CHOL 230 (H) 01/04/2017   TRIG 127.0 01/04/2017   HDL 70.50 01/04/2017  LDLDIRECT 155.9 12/14/2012   LDLCALC 134 (H) 01/04/2017   ALT 21 06/14/2017   AST 22 06/14/2017   NA 140 06/14/2017   K 4.4 06/14/2017   CL 103 06/14/2017   CREATININE 0.99 06/14/2017   BUN 15 06/14/2017   CO2 28 06/14/2017   TSH 1.90 01/04/2017    Lab Results  Component Value Date   TSH 1.90 01/04/2017   Lab Results  Component Value Date   WBC 6.6 01/04/2017   HGB 14.2 01/04/2017   HCT 43.0 01/04/2017   MCV 90.8 01/04/2017   PLT 244.0 01/04/2017   Lab Results  Component Value Date   NA 140 06/14/2017   K 4.4 06/14/2017   CO2 28 06/14/2017   GLUCOSE 89 06/14/2017   BUN 15 06/14/2017   CREATININE 0.99 06/14/2017   BILITOT 0.6 06/14/2017   ALKPHOS 87 06/14/2017   AST 22 06/14/2017   ALT 21 06/14/2017   PROT 7.4 06/14/2017   ALBUMIN 4.3 06/14/2017   CALCIUM 9.8 06/14/2017   GFR 59.48 (L) 06/14/2017   Lab Results  Component Value Date   CHOL 230 (H) 01/04/2017   Lab Results  Component Value Date   HDL 70.50 01/04/2017   Lab Results  Component Value Date   LDLCALC 134 (H) 01/04/2017   Lab Results  Component Value Date   TRIG 127.0 01/04/2017   Lab Results  Component Value Date   CHOLHDL 3 01/04/2017   No results found for: HGBA1C     Assessment & Plan:   Problem List Items Addressed This Visit    Osteopenia    small bowel obstruction      Hyperlipidemia, mild    Encouraged heart healthy diet, increase exercise, avoid trans fats, consider a krill oil cap daily      Cough    Worsened recently will treat a persistnet bronchitis with cefdinir, mucinex and probiotics.       Constipation      I am having Findley M. Peloquin start on  cefdinir. I am also having her maintain her Vitamin D (Ergocalciferol), mometasone-formoterol, budesonide-formoterol, and albuterol.  Meds ordered this encounter  Medications  . cefdinir (OMNICEF) 300 MG capsule    Sig: Take 1 capsule (300 mg total) by mouth 2 (two) times daily for 10 days.    Dispense:  20 capsule    Refill:  0     Penni Homans, MD

## 2017-09-04 NOTE — Assessment & Plan Note (Deleted)
Encouraged increased hydration and fiber in diet. Daily probiotics. If bowels not moving can use MOM 2 tbls po in 4 oz of warm prune juice by mouth every 2-3 days. If no results then repeat in 4 hours with  Dulcolax suppository pr, may repeat again in 4 more hours as needed. Seek care if symptoms worsen. Consider daily Miralax and/or Dulcolax if symptoms persist.  

## 2017-11-07 ENCOUNTER — Ambulatory Visit (HOSPITAL_BASED_OUTPATIENT_CLINIC_OR_DEPARTMENT_OTHER)
Admission: RE | Admit: 2017-11-07 | Discharge: 2017-11-07 | Disposition: A | Discharge status: Home / Self Care | Payer: Medicare HMO | Source: Ambulatory Visit | Attending: Family Medicine | Admitting: Family Medicine

## 2017-11-07 ENCOUNTER — Encounter: Payer: Self-pay | Admitting: Family Medicine

## 2017-11-07 ENCOUNTER — Ambulatory Visit (INDEPENDENT_AMBULATORY_CARE_PROVIDER_SITE_OTHER): Payer: Medicare HMO | Admitting: Family Medicine

## 2017-11-07 VITALS — BP 102/78 | HR 94 | Temp 98.0°F | Resp 18 | Wt 121.2 lb

## 2017-11-07 DIAGNOSIS — Z23 Encounter for immunization: Secondary | ICD-10-CM

## 2017-11-07 DIAGNOSIS — R05 Cough: Secondary | ICD-10-CM | POA: Diagnosis not present

## 2017-11-07 DIAGNOSIS — R059 Cough, unspecified: Secondary | ICD-10-CM

## 2017-11-07 DIAGNOSIS — R0602 Shortness of breath: Secondary | ICD-10-CM

## 2017-11-07 DIAGNOSIS — K5900 Constipation, unspecified: Secondary | ICD-10-CM

## 2017-11-07 LAB — CBC WITH DIFFERENTIAL/PLATELET
Basophils Absolute: 0.1 10*3/uL (ref 0.0–0.1)
Basophils Relative: 1.4 % (ref 0.0–3.0)
Eosinophils Absolute: 0.9 10*3/uL — ABNORMAL HIGH (ref 0.0–0.7)
Eosinophils Relative: 12.9 % — ABNORMAL HIGH (ref 0.0–5.0)
HCT: 44.5 % (ref 36.0–46.0)
Hemoglobin: 14.6 g/dL (ref 12.0–15.0)
Lymphocytes Relative: 23.1 % (ref 12.0–46.0)
Lymphs Abs: 1.7 10*3/uL (ref 0.7–4.0)
MCHC: 32.8 g/dL (ref 30.0–36.0)
MCV: 87.9 fl (ref 78.0–100.0)
Monocytes Absolute: 0.6 10*3/uL (ref 0.1–1.0)
Monocytes Relative: 8.7 % (ref 3.0–12.0)
Neutro Abs: 3.9 10*3/uL (ref 1.4–7.7)
Neutrophils Relative %: 53.9 % (ref 43.0–77.0)
Platelets: 285 10*3/uL (ref 150.0–400.0)
RBC: 5.06 Mil/uL (ref 3.87–5.11)
RDW: 15.2 % (ref 11.5–15.5)
WBC: 7.2 10*3/uL (ref 4.0–10.5)

## 2017-11-07 LAB — COMPREHENSIVE METABOLIC PANEL
ALT: 23 U/L (ref 0–35)
AST: 23 U/L (ref 0–37)
Albumin: 4.3 g/dL (ref 3.5–5.2)
Alkaline Phosphatase: 92 U/L (ref 39–117)
BUN: 11 mg/dL (ref 6–23)
CO2: 29 mEq/L (ref 19–32)
Calcium: 9.8 mg/dL (ref 8.4–10.5)
Chloride: 103 mEq/L (ref 96–112)
Creatinine, Ser: 0.89 mg/dL (ref 0.40–1.20)
GFR: 67.17 mL/min (ref >60.00)
Glucose, Bld: 66 mg/dL — ABNORMAL LOW (ref 70–99)
Potassium: 4.2 mEq/L (ref 3.5–5.1)
Sodium: 139 mEq/L (ref 135–145)
Total Bilirubin: 0.8 mg/dL (ref 0.2–1.2)
Total Protein: 7.2 g/dL (ref 6.0–8.3)

## 2017-11-07 MED ORDER — HYDROCODONE-HOMATROPINE 5-1.5 MG/5ML PO SYRP: 5.0000 mL | ORAL_SOLUTION | Freq: Three times a day (TID) | ORAL | 0 refills | Status: DC | PRN

## 2017-11-07 MED ORDER — CEFDINIR 300 MG PO CAPS: 300.0000 mg | ORAL_CAPSULE | Freq: Two times a day (BID) | ORAL | 0 refills | Status: AC

## 2017-11-07 NOTE — Assessment & Plan Note (Signed)
Struggling with episodes of significant SOB, will proceed with CXR and Echo. Report worsening symptoms

## 2017-11-07 NOTE — Progress Notes (Signed)
Subjective:  I acted as a Education administrator for Dr. Charlett Blake. [place], [place]  Patient ID: Danielle Fox, female    DOB: 12-Jun-1950, 67 y.o.   MRN: [id number]  No chief complaint on file.   HPI  Patient is in today for an acute visit for coughing. Patient states she had SOB, and coughing no production. She states she feels better today.  Flared in past couple of years. Has been needing her Albuterol every couple of hours lately with some improvement. She has been having some worsening SOB with cough. Cough is nonproductive but vigorous. Notes fatigue, malaise and possibly low grade fever. Denies CP/palp/HA/GI or GU c/o. Taking meds as prescribed Patient Care Team: Mosie Lukes, MD as PCP - General (Family Medicine)   Past Medical History:  Diagnosis Date  . Allergy   . Colon polyps   . Constipation 10/06/2014  . Osteopenia   . 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 Preventative health care 06/29/2016  . Pruritus 06/29/2016  . Welcome to Medicare preventive visit 06/29/2016    Past Surgical History:  Procedure Laterality Date  . ABDOMINAL HYSTERECTOMY    . APPENDECTOMY    . COLONOSCOPY     01-14-2005  . SINUS IRRIGATION  04/02/2015    Family History  Problem Relation Age of Onset  . Heart disease Mother        cabg, MI  . Alcohol abuse Father   . Cirrhosis Father   . Heart disease Father        MI  . Colon cancer Neg Hx   . Colon polyps Neg Hx   . Esophageal cancer Neg Hx   . Stomach cancer Neg Hx   . Rectal cancer Neg Hx     Social History   Socioeconomic History  . Marital status: Single    Spouse name: Not on file  . Number of children: Not on file  . Years of education: Not on file  . Highest education level: Not on file  Occupational History  . 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[employer]    Employer: [employer]  . Financial resource strain: Not on file  . Food insecurity:    Worry: Not on file    Inability: Not on file  . Transportation needs:    Medical: Not on  file    Non-medical: Not on file  Tobacco Use  . Smoking status: Former Smoker    Packs/day: 1.00    Years: 48.00    Pack years: 48.00    Types: Cigarettes    Last attempt to quit: 02/19/2014    Years since quitting: 3.7  . Smokeless tobacco: Never Used  . Tobacco comment: Quit and has no desire to smoke again,  Substance and Sexual Activity  . Alcohol use: Yes    Alcohol/week: 0.0 standard drinks    Comment: socially  . Drug use: No  . Sexual activity: Yes    Partners: Male  Lifestyle  . Physical activity:    Days per week: Not on file    Minutes per session: Not on file  . Stress: Not on file  Relationships  . Social connections:    Talks on phone: Not on file    Gets together: Not on file    Attends religious service: Not on file    Active member of club or organization: Not on file    Attends meetings of clubs or organizations: Not on file  Relationship status: Not on file  . Intimate partner violence:    Fear of current or ex partner: Not on file    Emotionally abused: Not on file    Physically abused: Not on file    Forced sexual activity: Not on file  Other Topics Concern  . Not on file  Social History Narrative   Exercise-- walks dog on occassion    Outpatient Medications Prior to Visit  Medication Sig Dispense Refill  . albuterol (PROVENTIL HFA;VENTOLIN HFA) 108 (90 Base) MCG/ACT inhaler Inhale 2 puffs into the lungs every 6 (six) hours as needed for wheezing or shortness of breath. 1 Inhaler 2  . budesonide-formoterol (SYMBICORT) 80-4.5 MCG/ACT inhaler Inhale 2 puffs into the lungs 2 (two) times daily. 1 Inhaler 1  . mometasone-formoterol (DULERA) 100-5 MCG/ACT AERO Inhale 2 puffs into the lungs daily. 13 g 2  . Vitamin D, Ergocalciferol, (DRISDOL) 50000 units CAPS capsule Take 1 capsule (50,000 Units total) every 7 (seven) days by mouth. 4 capsule 4   No facility-administered medications prior to visit.     Allergies  Allergen Reactions  .  Doxycycline Nausea And Vomiting  . Erythromycin Base     Other reaction(s): GI Upset (intolerance)    Review of Systems  Constitutional: Positive for fever and malaise/fatigue.  HENT: Positive for congestion.   Eyes: Negative for blurred vision.  Respiratory: Positive for cough and sputum production. Negative for shortness of breath.   Cardiovascular: Negative for chest pain, palpitations and leg swelling.  Gastrointestinal: Negative for abdominal pain, blood in stool and nausea.  Genitourinary: Negative for dysuria and frequency.  Musculoskeletal: Positive for myalgias. Negative for falls.  Skin: Negative for rash.  Neurological: Negative for dizziness, loss of consciousness and headaches.  Endo/Heme/Allergies: Negative for environmental allergies.  Psychiatric/Behavioral: Negative for depression. The patient is not nervous/anxious.        Objective:    Physical Exam  Constitutional: She is oriented to person, place, and time. She appears well-developed and well-nourished. No distress.  HENT:  Head: Normocephalic and atraumatic.  Nose: Nose normal.  Nasal mucosa boggy and erythematous.   Eyes: Right eye exhibits no discharge. Left eye exhibits no discharge.  Neck: Normal range of motion. Neck supple.  Cardiovascular: Normal rate and regular rhythm.  No murmur heard. Pulmonary/Chest: Effort normal and breath sounds normal.  Abdominal: Soft. Bowel sounds are normal. There is no tenderness.  Musculoskeletal: She exhibits no edema.  Neurological: She is alert and oriented to person, place, and time.  Skin: Skin is warm and dry.  Psychiatric: She has a normal mood and affect.  Nursing note and vitals reviewed.   BP 102/78 (BP Location: Left Arm, Patient Position: Sitting, Cuff Size: Normal)   Pulse 94   Temp 98 F (36.7 C) (Oral)   Resp 18   Wt 121 lb 3.2 oz (55 kg)   SpO2 94%   BMI 22.90 kg/m  Wt Readings from Last 3 Encounters:  11/07/17 121 lb 3.2 oz (55 kg)    08/30/17 120 lb 8 oz (54.7 kg)  06/14/17 121 lb (54.9 kg)   BP Readings from Last 3 Encounters:  11/07/17 102/78  08/30/17 108/62  06/14/17 100/64     Immunization History  Administered Date(s) Administered  . Influenza Split 01/07/2011  . Influenza Whole 11/29/2009  . Influenza, High Dose Seasonal PF 11/07/2017  . Influenza,inj,Quad PF,6+ Mos 12/14/2012  . Influenza-Unspecified 12/28/2013, 12/29/2014, 12/01/2015, 12/25/2016  . Pneumococcal Conjugate-13 06/29/2016  . Tdap 03/01/2001, 06/29/2016  Health Maintenance  Topic Date Due  . Hepatitis C Screening  03-14-1950  . PNA vac Low Risk Adult (2 of 2 - PPSV23) 06/29/2017  . INFLUENZA VACCINE  09/29/2017  . MAMMOGRAM  01/05/2018  . COLONOSCOPY  01/30/2025  . TETANUS/TDAP  06/30/2026  . DEXA SCAN  Completed    Lab Results  Component Value Date   WBC 7.2 11/07/2017   HGB 14.6 11/07/2017   HCT 44.5 11/07/2017   PLT 285.0 11/07/2017   GLUCOSE 66 (L) 11/07/2017   CHOL 230 (H) 01/04/2017   TRIG 127.0 01/04/2017   HDL 70.50 01/04/2017   LDLDIRECT 155.9 12/14/2012   LDLCALC 134 (H) 01/04/2017   ALT 23 11/07/2017   AST 23 11/07/2017   NA 139 11/07/2017   K 4.2 11/07/2017   CL 103 11/07/2017   CREATININE 0.89 11/07/2017   BUN 11 11/07/2017   CO2 29 11/07/2017   TSH 1.90 01/04/2017    Lab Results  Component Value Date   TSH 1.90 01/04/2017   Lab Results  Component Value Date   WBC 7.2 11/07/2017   HGB 14.6 11/07/2017   HCT 44.5 11/07/2017   MCV 87.9 11/07/2017   PLT 285.0 11/07/2017   Lab Results  Component Value Date   NA 139 11/07/2017   K 4.2 11/07/2017   CO2 29 11/07/2017   GLUCOSE 66 (L) 11/07/2017   BUN 11 11/07/2017   CREATININE 0.89 11/07/2017   BILITOT 0.8 11/07/2017   ALKPHOS 92 11/07/2017   AST 23 11/07/2017   ALT 23 11/07/2017   PROT 7.2 11/07/2017   ALBUMIN 4.3 11/07/2017   CALCIUM 9.8 11/07/2017   GFR 67.17 11/07/2017   Lab Results  Component Value Date   CHOL 230 (H) 01/04/2017    Lab Results  Component Value Date   HDL 70.50 01/04/2017   Lab Results  Component Value Date   LDLCALC 134 (H) 01/04/2017   Lab Results  Component Value Date   TRIG 127.0 01/04/2017   Lab Results  Component Value Date   CHOLHDL 3 01/04/2017   No results found for: HGBA1C       Assessment & Plan:   Problem List Items Addressed This Visit    Cough    Flared in past couple of years. Has been needing her Albuterol every couple of hours lately with some improvement. She has been having some worsening SOB with cough. Cough is often productive with brown sputum being produced. Will check CXR and start Cefdinir bid and use nasal saline flushes bid and Flonase daily      Relevant Orders   DG Chest 2 View (Completed)   ECHOCARDIOGRAM COMPLETE   CBC w/Diff (Completed)   Comprehensive metabolic panel (Completed)   SOB (shortness of breath)    Struggling with episodes of significant SOB, will proceed with CXR and Echo. Report worsening symptoms      Relevant Orders   DG Chest 2 View (Completed)   ECHOCARDIOGRAM COMPLETE   CBC w/Diff (Completed)   Comprehensive metabolic panel (Completed)   Constipation    Encouraged increased hydration and fiber in diet. Daily probiotics. If bowels not moving can use MOM 2 tbls po in 4 oz of warm prune juice by mouth every 2-3 days. If no results then repeat in 4 hours with  Dulcolax suppository pr, may repeat again in 4 more hours as needed. Seek care if symptoms worsen. Consider daily Miralax and/or Dulcolax if symptoms persist.        Other  Visit Diagnoses    Needs flu shot    -  Primary   Relevant Orders   Flu vaccine HIGH DOSE PF (Fluzone High dose) (Completed)      I am having Alante M. Hurtubise start on cefdinir and HYDROcodone-homatropine. I am also having her maintain her Vitamin D (Ergocalciferol), mometasone-formoterol, budesonide-formoterol, and albuterol.  Meds ordered this encounter  Medications  . cefdinir (OMNICEF) 300  MG capsule    Sig: Take 1 capsule (300 mg total) by mouth 2 (two) times daily for 10 days.    Dispense:  20 capsule    Refill:  0  . HYDROcodone-homatropine (HYCODAN) 5-1.5 MG/5ML syrup    Sig: Take 5 mLs by mouth every 8 (eight) hours as needed for cough.    Dispense:  65 mL    Refill:  0    CMA served as scribe during this visit. History, Physical and Plan performed by medical provider. Documentation and orders reviewed and attested to.  Penni Homans, MD

## 2017-11-07 NOTE — Assessment & Plan Note (Signed)
Encouraged increased hydration and fiber in diet. Daily probiotics. If bowels not moving can use MOM 2 tbls po in 4 oz of warm prune juice by mouth every 2-3 days. If no results then repeat in 4 hours with  Dulcolax suppository pr, may repeat again in 4 more hours as needed. Seek care if symptoms worsen. Consider daily Miralax and/or Dulcolax if symptoms persist.  

## 2017-11-07 NOTE — Assessment & Plan Note (Addendum)
Flared in past couple of years. Has been needing her Albuterol every couple of hours lately with some improvement. She has been having some worsening SOB with cough. Cough is nonproductive but vigorous. Will check CXR and start Cefdinir bid and use nasal saline flushes bid and Flonase daily

## 2017-11-07 NOTE — Patient Instructions (Signed)
Mucinex twice daily Probiotic multistrain Cough, Adult Coughing is a reflex that clears your throat and your airways. Coughing helps to heal and protect your lungs. It is normal to cough occasionally, but a cough that happens with other symptoms or lasts a long time may be a sign of a condition that needs treatment. A cough may last only 2-3 weeks (acute), or it may last longer than 8 weeks (chronic). What are the causes? Coughing is commonly caused by:  Breathing in substances that irritate your lungs.  A viral or bacterial respiratory infection.  Allergies.  Asthma.  Postnasal drip.  Smoking.  Acid backing up from the stomach into the esophagus (gastroesophageal reflux).  Certain medicines.  Chronic lung problems, including COPD (or rarely, lung cancer).  Other medical conditions such as heart failure.  Follow these instructions at home: Pay attention to any changes in your symptoms. Take these actions to help with your discomfort:  Take medicines only as told by your health care provider. ? If you were prescribed an antibiotic medicine, take it as told by your health care provider. Do not stop taking the antibiotic even if you start to feel better. ? Talk with your health care provider before you take a cough suppressant medicine.  Drink enough fluid to keep your urine clear or pale yellow.  If the air is dry, use a cold steam vaporizer or humidifier in your bedroom or your home to help loosen secretions.  Avoid anything that causes you to cough at work or at home.  If your cough is worse at night, try sleeping in a semi-upright position.  Avoid cigarette smoke. If you smoke, quit smoking. If you need help quitting, ask your health care provider.  Avoid caffeine.  Avoid alcohol.  Rest as needed.  Contact a health care provider if:  You have new symptoms.  You cough up pus.  Your cough does not get better after 2-3 weeks, or your cough gets worse.  You  cannot control your cough with suppressant medicines and you are losing sleep.  You develop pain that is getting worse or pain that is not controlled with pain medicines.  You have a fever.  You have unexplained weight loss.  You have night sweats. Get help right away if:  You cough up blood.  You have difficulty breathing.  Your heartbeat is very fast. This information is not intended to replace advice given to you by your health care provider. Make sure you discuss any questions you have with your health care provider. Document Released: 08/14/2010 Document Revised: 07/24/2015 Document Reviewed: 04/24/2014 Elsevier Interactive Patient Education  [employer].

## 2017-11-11 ENCOUNTER — Ambulatory Visit (HOSPITAL_BASED_OUTPATIENT_CLINIC_OR_DEPARTMENT_OTHER)
Admission: RE | Admit: 2017-11-11 | Discharge: 2017-11-11 | Disposition: A | Discharge status: Home / Self Care | Payer: Medicare HMO | Source: Ambulatory Visit | Attending: Family Medicine | Admitting: Family Medicine

## 2017-11-11 DIAGNOSIS — R0600 Dyspnea, unspecified: Secondary | ICD-10-CM | POA: Insufficient documentation

## 2017-11-11 DIAGNOSIS — I5189 Other ill-defined heart diseases: Secondary | ICD-10-CM | POA: Diagnosis not present

## 2017-11-11 DIAGNOSIS — R05 Cough: Secondary | ICD-10-CM | POA: Diagnosis not present

## 2017-11-11 DIAGNOSIS — R059 Cough, unspecified: Secondary | ICD-10-CM

## 2017-11-11 DIAGNOSIS — R0602 Shortness of breath: Secondary | ICD-10-CM | POA: Diagnosis not present

## 2017-11-11 NOTE — Progress Notes (Signed)
  Echocardiogram 2D Echocardiogram has been performed.  Jeanne Diefendorf T Dontai Pember 11/11/2017, 12:11 PM

## 2017-12-06 ENCOUNTER — Ambulatory Visit: Payer: Medicare HMO | Admitting: Family Medicine

## 2017-12-08 ENCOUNTER — Ambulatory Visit: Payer: Medicare HMO | Admitting: Family Medicine

## 2017-12-08 ENCOUNTER — Encounter: Payer: Self-pay | Admitting: Family Medicine

## 2017-12-08 MED ORDER — TRIAMCINOLONE ACETONIDE 0.1 % EX CREA: 1.0000 "application " | TOPICAL_CREAM | Freq: Two times a day (BID) | CUTANEOUS | 0 refills | Status: DC

## 2017-12-25 ENCOUNTER — Other Ambulatory Visit: Payer: Self-pay | Admitting: Family Medicine

## 2018-01-16 DIAGNOSIS — H01001 Unspecified blepharitis right upper eyelid: Secondary | ICD-10-CM | POA: Diagnosis not present

## 2018-01-16 DIAGNOSIS — H11441 Conjunctival cysts, right eye: Secondary | ICD-10-CM | POA: Diagnosis not present

## 2018-01-16 DIAGNOSIS — H01004 Unspecified blepharitis left upper eyelid: Secondary | ICD-10-CM | POA: Diagnosis not present

## 2018-01-18 ENCOUNTER — Ambulatory Visit (HOSPITAL_BASED_OUTPATIENT_CLINIC_OR_DEPARTMENT_OTHER)
Admission: RE | Admit: 2018-01-18 | Discharge: 2018-01-18 | Disposition: A | Discharge status: Home / Self Care | Payer: Medicare HMO | Source: Ambulatory Visit | Attending: Acute Care | Admitting: Acute Care

## 2018-01-18 DIAGNOSIS — I700 Atherosclerosis of aorta: Secondary | ICD-10-CM | POA: Insufficient documentation

## 2018-01-18 DIAGNOSIS — Z122 Encounter for screening for malignant neoplasm of respiratory organs: Secondary | ICD-10-CM | POA: Diagnosis not present

## 2018-01-18 DIAGNOSIS — R918 Other nonspecific abnormal finding of lung field: Secondary | ICD-10-CM | POA: Diagnosis not present

## 2018-01-18 DIAGNOSIS — Z87891 Personal history of nicotine dependence: Secondary | ICD-10-CM

## 2018-01-18 DIAGNOSIS — J432 Centrilobular emphysema: Secondary | ICD-10-CM | POA: Insufficient documentation

## 2018-01-23 ENCOUNTER — Telehealth: Payer: Self-pay | Admitting: Acute Care

## 2018-01-23 DIAGNOSIS — Z122 Encounter for screening for malignant neoplasm of respiratory organs: Secondary | ICD-10-CM

## 2018-01-23 DIAGNOSIS — Z87891 Personal history of nicotine dependence: Secondary | ICD-10-CM

## 2018-01-23 NOTE — Telephone Encounter (Signed)
Pt informed of CT results per Sarah Groce, NP.  PT verbalized understanding.  Copy sent to PCP.  Order placed for 1 yr f/u CT.  

## 2018-01-30 DIAGNOSIS — H2513 Age-related nuclear cataract, bilateral: Secondary | ICD-10-CM | POA: Diagnosis not present

## 2018-01-30 DIAGNOSIS — D3132 Benign neoplasm of left choroid: Secondary | ICD-10-CM | POA: Diagnosis not present

## 2018-01-30 DIAGNOSIS — H01004 Unspecified blepharitis left upper eyelid: Secondary | ICD-10-CM | POA: Diagnosis not present

## 2018-01-30 DIAGNOSIS — H01001 Unspecified blepharitis right upper eyelid: Secondary | ICD-10-CM | POA: Diagnosis not present

## 2018-01-30 DIAGNOSIS — H11441 Conjunctival cysts, right eye: Secondary | ICD-10-CM | POA: Diagnosis not present

## 2018-03-19 ENCOUNTER — Other Ambulatory Visit: Payer: Self-pay | Admitting: Family Medicine

## 2018-03-23 DIAGNOSIS — R69 Illness, unspecified: Secondary | ICD-10-CM | POA: Diagnosis not present

## 2018-04-13 ENCOUNTER — Other Ambulatory Visit: Payer: Self-pay | Admitting: Family Medicine

## 2018-04-21 ENCOUNTER — Other Ambulatory Visit: Payer: Self-pay | Admitting: Family Medicine

## 2018-05-30 ENCOUNTER — Encounter: Payer: Self-pay | Admitting: Family Medicine

## 2018-06-01 MED ORDER — BUDESONIDE-FORMOTEROL FUMARATE 80-4.5 MCG/ACT IN AERO: INHALATION_SPRAY | RESPIRATORY_TRACT | 1 refills | Status: DC

## 2018-08-23 DIAGNOSIS — H0230 Blepharochalasis unspecified eye, unspecified eyelid: Secondary | ICD-10-CM | POA: Diagnosis not present

## 2018-09-14 ENCOUNTER — Other Ambulatory Visit: Payer: Self-pay

## 2018-09-14 ENCOUNTER — Ambulatory Visit: Payer: Medicare HMO | Admitting: Family Medicine

## 2018-09-19 ENCOUNTER — Other Ambulatory Visit: Payer: Self-pay

## 2018-09-19 ENCOUNTER — Ambulatory Visit (INDEPENDENT_AMBULATORY_CARE_PROVIDER_SITE_OTHER): Payer: Medicare HMO | Admitting: Family Medicine

## 2018-09-19 DIAGNOSIS — R197 Diarrhea, unspecified: Secondary | ICD-10-CM

## 2018-09-19 DIAGNOSIS — E559 Vitamin D deficiency, unspecified: Secondary | ICD-10-CM | POA: Diagnosis not present

## 2018-09-20 ENCOUNTER — Other Ambulatory Visit: Payer: Self-pay | Admitting: Family Medicine

## 2018-09-20 ENCOUNTER — Encounter: Payer: Self-pay | Admitting: Family Medicine

## 2018-09-20 DIAGNOSIS — Z20822 Contact with and (suspected) exposure to covid-19: Secondary | ICD-10-CM

## 2018-09-20 NOTE — Progress Notes (Signed)
no

## 2018-09-24 DIAGNOSIS — R197 Diarrhea, unspecified: Secondary | ICD-10-CM | POA: Insufficient documentation

## 2018-09-24 LAB — NOVEL CORONAVIRUS, NAA: SARS-CoV-2, NAA: NOT DETECTED

## 2018-09-24 NOTE — Assessment & Plan Note (Signed)
Has been having trouble intermittently for the past month with several loose stool daily. This week it has been worse with roughly 4 loose stool a day after eating ice cream recently, had some vomiting as well. No fevers or chills. She does better whne she only drinks clear liquids and simple carbs. Her symptoms started after a trip to [place] a month ago. Anti diarrheals have been helpful. Covid 19 is negative.

## 2018-09-24 NOTE — Progress Notes (Signed)
Virtual Visit via phone Note  I connected with Danielle Fox on7/21/20at  3:20 PM EDT by a phone enabled telemedicine application and verified that I am speaking with the correct person using two identifiers.  Location: Patient: home Provider: home   I discussed the limitations of evaluation and management by telemedicine and the availability of in person appointments. The patient expressed understanding and agreed to proceed. Danielle Fox CMA was able to get the patient set up on a virtual visit, phone when she was unable to complete a video visit.     Subjective:    Patient ID: Danielle Fox, female    DOB: 03-08-1950, 68 y.o.   MRN: [id number]  No chief complaint on file.   HPI Patient is in today for evaluation of diarrhea. Has been having trouble intermittently for the past month with several loose stool daily. This week it has been worse with roughly 4 loose stool a day after eating ice cream recently, had some vomiting as well. No fevers or chills. She does better whne she only drinks clear liquids and simple carbs. Her symptoms started after a trip to [place] a month ago. Anti diarrheals have been helpful. Denies CP/palp/SOB/HA/congestion/fevers or GU c/o. Taking meds as prescribed  Past Medical History:  Diagnosis Date  . Allergy   . Colon polyps   . Constipation 10/06/2014  . Osteopenia   . 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 Preventative health care 06/29/2016  . Pruritus 06/29/2016  . Welcome to Medicare preventive visit 06/29/2016    Past Surgical History:  Procedure Laterality Date  . ABDOMINAL HYSTERECTOMY    . APPENDECTOMY    . COLONOSCOPY     01-14-2005  . SINUS IRRIGATION  04/02/2015    Family History  Problem Relation Age of Onset  . Heart disease Mother        cabg, MI  . Alcohol abuse Father   . Cirrhosis Father   . Heart disease Father        MI  . Colon cancer Neg Hx   . Colon polyps Neg Hx   . Esophageal cancer Neg Hx   . Stomach  cancer Neg Hx   . Rectal cancer Neg Hx     Social History   Socioeconomic History  . Marital status: Single    Spouse name: Not on file  . Number of children: Not on file  . Years of education: Not on file  . Highest education level: Not on file  Occupational History  . 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[employer]    Employer: [employer]  . Financial resource strain: Not on file  . Food insecurity    Worry: Not on file    Inability: Not on file  . Transportation needs    Medical: Not on file    Non-medical: Not on file  Tobacco Use  . Smoking status: Former Smoker    Packs/day: 1.00    Years: 48.00    Pack years: 48.00    Types: Cigarettes    Quit date: 02/19/2014    Years since quitting: 4.5  . Smokeless tobacco: Never Used  . Tobacco comment: Quit and has no desire to smoke again,  Substance and Sexual Activity  . Alcohol use: Yes    Alcohol/week: 0.0 standard drinks    Comment: socially  . Drug use: No  . Sexual activity: Yes    Partners: Male  Lifestyle  . Physical activity  Days per week: Not on file    Minutes per session: Not on file  . Stress: Not on file  Relationships  . Social Herbalist on phone: Not on file    Gets together: Not on file    Attends religious service: Not on file    Active member of club or organization: Not on file    Attends meetings of clubs or organizations: Not on file    Relationship status: Not on file  . Intimate partner violence    Fear of current or ex partner: Not on file    Emotionally abused: Not on file    Physically abused: Not on file    Forced sexual activity: Not on file  Other Topics Concern  . Not on file  Social History Narrative   Exercise-- walks dog on occassion    Outpatient Medications Prior to Visit  Medication Sig Dispense Refill  . albuterol (PROVENTIL HFA;VENTOLIN HFA) 108 (90 Base) MCG/ACT inhaler Inhale 2 puffs into the lungs every 6 (six) hours as needed for wheezing or shortness of  breath. 1 Inhaler 2  . budesonide-formoterol (SYMBICORT) 80-4.5 MCG/ACT inhaler USE 2 PUFFS 2 TIMES DAILY 30.6 g 1  . HYDROcodone-homatropine (HYCODAN) 5-1.5 MG/5ML syrup Take 5 mLs by mouth every 8 (eight) hours as needed for cough. 65 mL 0  . mometasone-formoterol (DULERA) 100-5 MCG/ACT AERO Inhale 2 puffs into the lungs daily. 13 g 2  . triamcinolone cream (KENALOG) 0.1 % Apply 1 application topically 2 (two) times daily. 30 g 0  . Vitamin D, Ergocalciferol, (DRISDOL) 50000 units CAPS capsule Take 1 capsule (50,000 Units total) every 7 (seven) days by mouth. 4 capsule 4   No facility-administered medications prior to visit.     Allergies  Allergen Reactions  . Doxycycline Nausea And Vomiting  . Erythromycin Base     Other reaction(s): GI Upset (intolerance)    Review of Systems  Constitutional: Negative for fever and malaise/fatigue.  HENT: Negative for congestion.   Eyes: Negative for blurred vision.  Respiratory: Negative for shortness of breath.   Cardiovascular: Negative for chest pain, palpitations and leg swelling.  Gastrointestinal: Positive for abdominal pain, diarrhea, nausea and vomiting. Negative for blood in stool, constipation, heartburn and melena.  Genitourinary: Negative for dysuria and frequency.  Musculoskeletal: Negative for falls.  Skin: Negative for rash.  Neurological: Negative for dizziness, loss of consciousness and headaches.  Endo/Heme/Allergies: Negative for environmental allergies.  Psychiatric/Behavioral: Negative for depression. The patient is not nervous/anxious.        Objective:    Physical Exam unable to obtain via phone  There were no vitals taken for this visit. Wt Readings from Last 3 Encounters:  11/07/17 121 lb 3.2 oz (55 kg)  08/30/17 120 lb 8 oz (54.7 kg)  06/14/17 121 lb (54.9 kg)    Diabetic Foot Exam - Simple   No data filed     Lab Results  Component Value Date   WBC 7.2 11/07/2017   HGB 14.6 11/07/2017   HCT 44.5  11/07/2017   PLT 285.0 11/07/2017   GLUCOSE 66 (L) 11/07/2017   CHOL 230 (H) 01/04/2017   TRIG 127.0 01/04/2017   HDL 70.50 01/04/2017   LDLDIRECT 155.9 12/14/2012   LDLCALC 134 (H) 01/04/2017   ALT 23 11/07/2017   AST 23 11/07/2017   NA 139 11/07/2017   K 4.2 11/07/2017   CL 103 11/07/2017   CREATININE 0.89 11/07/2017   BUN 11 11/07/2017  CO2 29 11/07/2017   TSH 1.90 01/04/2017    Lab Results  Component Value Date   TSH 1.90 01/04/2017   Lab Results  Component Value Date   WBC 7.2 11/07/2017   HGB 14.6 11/07/2017   HCT 44.5 11/07/2017   MCV 87.9 11/07/2017   PLT 285.0 11/07/2017   Lab Results  Component Value Date   NA 139 11/07/2017   K 4.2 11/07/2017   CO2 29 11/07/2017   GLUCOSE 66 (L) 11/07/2017   BUN 11 11/07/2017   CREATININE 0.89 11/07/2017   BILITOT 0.8 11/07/2017   ALKPHOS 92 11/07/2017   AST 23 11/07/2017   ALT 23 11/07/2017   PROT 7.2 11/07/2017   ALBUMIN 4.3 11/07/2017   CALCIUM 9.8 11/07/2017   GFR 67.17 11/07/2017   Lab Results  Component Value Date   CHOL 230 (H) 01/04/2017   Lab Results  Component Value Date   HDL 70.50 01/04/2017   Lab Results  Component Value Date   LDLCALC 134 (H) 01/04/2017   Lab Results  Component Value Date   TRIG 127.0 01/04/2017   Lab Results  Component Value Date   CHOLHDL 3 01/04/2017   No results found for: HGBA1C     Assessment & Plan:   Problem List Items Addressed This Visit    Vitamin D deficiency    Supplement and monitor      Diarrhea - Primary    Has been having trouble intermittently for the past month with several loose stool daily. This week it has been worse with roughly 4 loose stool a day after eating ice cream recently, had some vomiting as well. No fevers or chills. She does better whne she only drinks clear liquids and simple carbs. Her symptoms started after a trip to [place] a month ago. Anti diarrheals have been helpful. Covid 19 is negative.          I am having  Jerianne M. Bredeson maintain her Vitamin D (Ergocalciferol), mometasone-formoterol, albuterol, HYDROcodone-homatropine, triamcinolone cream, and budesonide-formoterol.  No orders of the defined types were placed in this encounter.   I discussed the assessment and treatment plan with the patient. The patient was provided an opportunity to ask questions and all were answered. The patient agreed with the plan and demonstrated an understanding of the instructions.   The patient was advised to call back or seek an in-person evaluation if the symptoms worsen or if the condition fails to improve as anticipated.  I provided 20 minutes of non-face-to-face time during this encounter.   Penni Homans, MD

## 2018-09-24 NOTE — Assessment & Plan Note (Signed)
Supplement and monitor 

## 2018-09-25 ENCOUNTER — Telehealth: Payer: Self-pay

## 2018-09-25 DIAGNOSIS — R197 Diarrhea, unspecified: Secondary | ICD-10-CM

## 2018-09-25 DIAGNOSIS — R109 Unspecified abdominal pain: Secondary | ICD-10-CM

## 2018-09-25 DIAGNOSIS — D729 Disorder of white blood cells, unspecified: Secondary | ICD-10-CM

## 2018-09-25 NOTE — Telephone Encounter (Signed)
Spoke with patient she stated she would like the blood work, the xray and the referral to gastro. I have placed the referral to gastro,  Please advise on the xray and the blood work

## 2018-09-25 NOTE — Telephone Encounter (Signed)
OK but please confirm what her current symptoms are? Still diarrhea? How many episodes a day. Any fever? Please order stool culture, stool for ova and parasites, stool for WBC, Stool for CDiff but make sure she knows to only collect a loose stool they will not test a firm stool. Also CBC with diff, sed rate, cmp, tsh, amylase, lipase, H Pylori for abdominal pain and diarrhea

## 2018-09-25 NOTE — Telephone Encounter (Signed)
She stated she has no diarrhea however she still does not feel right.

## 2018-09-25 NOTE — Telephone Encounter (Signed)
Please schedule patient lab appt

## 2018-09-26 NOTE — Telephone Encounter (Signed)
Appt. Scheduled.

## 2018-09-27 ENCOUNTER — Telehealth: Payer: Self-pay | Admitting: *Deleted

## 2018-09-27 ENCOUNTER — Other Ambulatory Visit: Payer: Self-pay

## 2018-09-27 ENCOUNTER — Other Ambulatory Visit (INDEPENDENT_AMBULATORY_CARE_PROVIDER_SITE_OTHER): Payer: Medicare HMO

## 2018-09-27 DIAGNOSIS — R197 Diarrhea, unspecified: Secondary | ICD-10-CM

## 2018-09-27 DIAGNOSIS — R109 Unspecified abdominal pain: Secondary | ICD-10-CM

## 2018-09-27 NOTE — Telephone Encounter (Signed)
Thank you for letting me know should be effective.

## 2018-09-27 NOTE — Telephone Encounter (Signed)
Pt came for lab appt today and did the H. Pylori breath test. After drinking the solution for the test the pt waited outside the lab for 15 min per test protocol. Pt started coughing and then started gagging after waiting for a couple of minutes. She states she has a "smokers cough" and thinks this is what caused the gagging.  She states only a little of the solution came back up. She did wait the remaining time then blew into the 2nd bag. I am unsure if her test will be affected by this or not but wanted to let you know.

## 2018-09-28 LAB — CBC WITH DIFFERENTIAL/PLATELET
Basophils Absolute: 0.1 10*3/uL (ref 0.0–0.1)
Basophils Relative: 0.9 % (ref 0.0–3.0)
Eosinophils Absolute: 1.2 10*3/uL — ABNORMAL HIGH (ref 0.0–0.7)
Eosinophils Relative: 15.7 % — ABNORMAL HIGH (ref 0.0–5.0)
HCT: 41.9 % (ref 36.0–46.0)
Hemoglobin: 13.6 g/dL (ref 12.0–15.0)
Lymphocytes Relative: 26.3 % (ref 12.0–46.0)
Lymphs Abs: 2 10*3/uL (ref 0.7–4.0)
MCHC: 32.4 g/dL (ref 30.0–36.0)
MCV: 89.5 fl (ref 78.0–100.0)
Monocytes Absolute: 0.6 10*3/uL (ref 0.1–1.0)
Monocytes Relative: 8.3 % (ref 3.0–12.0)
Neutro Abs: 3.7 10*3/uL (ref 1.4–7.7)
Neutrophils Relative %: 48.8 % (ref 43.0–77.0)
Platelets: 269 10*3/uL (ref 150.0–400.0)
RBC: 4.69 Mil/uL (ref 3.87–5.11)
RDW: 14.1 % (ref 11.5–15.5)
WBC: 7.5 10*3/uL (ref 4.0–10.5)

## 2018-09-28 LAB — COMPREHENSIVE METABOLIC PANEL
ALT: 15 U/L (ref 0–35)
AST: 19 U/L (ref 0–37)
Albumin: 4.3 g/dL (ref 3.5–5.2)
Alkaline Phosphatase: 95 U/L (ref 39–117)
BUN: 9 mg/dL (ref 6–23)
CO2: 28 mEq/L (ref 19–32)
Calcium: 9.8 mg/dL (ref 8.4–10.5)
Chloride: 103 mEq/L (ref 96–112)
Creatinine, Ser: 0.95 mg/dL (ref 0.40–1.20)
GFR: 58.46 mL/min — ABNORMAL LOW (ref >60.00)
Glucose, Bld: 82 mg/dL (ref 70–99)
Potassium: 4.2 mEq/L (ref 3.5–5.1)
Sodium: 141 mEq/L (ref 135–145)
Total Bilirubin: 0.7 mg/dL (ref 0.2–1.2)
Total Protein: 6.9 g/dL (ref 6.0–8.3)

## 2018-09-28 LAB — SEDIMENTATION RATE: Sed Rate: 20 mm/hr (ref 0–30)

## 2018-09-28 LAB — LIPASE: Lipase: 28 U/L (ref 11.0–59.0)

## 2018-09-28 LAB — TSH: TSH: 1.75 u[IU]/mL (ref 0.35–4.50)

## 2018-09-28 LAB — AMYLASE: Amylase: 41 U/L (ref 27–131)

## 2018-09-28 LAB — H. PYLORI BREATH TEST: H. pylori Breath Test: NOT DETECTED

## 2018-09-29 MED ORDER — FLUCONAZOLE 150 MG PO TABS: ORAL_TABLET | ORAL | 0 refills | Status: DC

## 2018-09-29 NOTE — Addendum Note (Signed)
Addended by: Magdalene Molly A on: 09/29/2018 03:23 PM   Modules accepted: Orders

## 2018-10-18 ENCOUNTER — Ambulatory Visit: Payer: Medicare HMO | Admitting: Physician Assistant

## 2018-10-18 ENCOUNTER — Encounter: Payer: Self-pay | Admitting: Physician Assistant

## 2018-10-18 ENCOUNTER — Other Ambulatory Visit: Payer: Medicare HMO

## 2018-10-18 VITALS — BP 90/60 | HR 98 | Temp 97.9°F | Ht 61.0 in | Wt 121.0 lb

## 2018-10-18 DIAGNOSIS — R112 Nausea with vomiting, unspecified: Secondary | ICD-10-CM

## 2018-10-18 DIAGNOSIS — R109 Unspecified abdominal pain: Secondary | ICD-10-CM

## 2018-10-18 DIAGNOSIS — R197 Diarrhea, unspecified: Secondary | ICD-10-CM

## 2018-10-18 MED ORDER — DICYCLOMINE HCL 10 MG PO CAPS: 10.0000 mg | ORAL_CAPSULE | Freq: Four times a day (QID) | ORAL | 3 refills | Status: DC | PRN

## 2018-10-18 MED ORDER — ONDANSETRON HCL 4 MG PO TABS: ORAL_TABLET | ORAL | 1 refills | Status: DC

## 2018-10-18 NOTE — Patient Instructions (Addendum)
If you are age 68 or older, your body mass index should be between 23-30. Your Body mass index is 22.86 kg/m. If this is out of the aforementioned range listed, please consider follow up with your Primary Care Provider.  If you are age 41 or younger, your body mass index should be between 19-25. Your Body mass index is 22.86 kg/m. If this is out of the aformentioned range listed, please consider follow up with your Primary Care Provider.   Your provider has requested that you go to the basement level for lab work before leaving today. Press "B" on the elevator. The lab is located at the first door on the left as you exit the elevator.   You have been scheduled for an endoscopy and colonoscopy. Please follow the written instructions given to you at your visit today. Please pick up your prep supplies at the pharmacy within the next 1-3 days. If you use inhalers (even only as needed), please bring them with you on the day of your procedure. Your physician has requested that you go to www.startemmi.com and enter the access code given to you at your visit today. This web site gives a general overview about your procedure. However, you should still follow specific instructions given to you by our office regarding your preparation for the procedure.  We have sent the following medications to your pharmacy for you to pick up at your convenience: Zofran 4 mg Bentyl 10 mg  Bland diet.  Push fluids.  Thank you for choosing me and [facility].   Amy Esterwood, PA-C

## 2018-10-18 NOTE — Progress Notes (Addendum)
Subjective:    Patient ID: Danielle Fox, female    DOB: Dec 01, 1950, 68 y.o.   MRN: [id number]  HPI Danielle Fox is a pleasant 68 year old white female, known to Danielle Fox from prior colonoscopy who is referred today by Danielle Fox for further evaluation of 62-monthhistory of diarrhea and abdominal cramping. Patient had undergone colonoscopy in December 2016, this a somewhat angulated sigmoid colon, 2 small polyps were removed which were by biopsy hyperplastic. Patient states in the past that she had generally tended more towards constipation. Her current symptoms started in February with diarrhea, upper abdominal pain and cramping.  She says she will have "horrible" symptoms for several days, then may not have a bowel movement for a day or 2 and then goes back to abdominal cramping and diarrhea.  Most days she is having at least 3-4 bowel movements per day, described as liquid and nonbloody.  She is having occasional episodes of diarrhea requiring her to get up in the middle of the night.  She has had nausea off and on, and a few occasions of vomiting.  Her weight overall has been fairly stable.  She has been on a brat diet over the past couple of months which she says has helped a little bit.  She has not had any fever or chills.  She does not require any antibiotic use prior to onset in February. Labs were done on 09/25/2018 with CBC c-Met and sed rate all within normal limits.  Interestingly she does have an elevated eosinophil count with absolute eosinophils at 1.2.  Reviewing labs eosinophilia has been present over the past 4 to 5 years. Patient had recently been started on a course of Diflucan per PCP for question of fungal source for diarrhea because of increased eosinophils. Stool studies were ordered but not obtained. Review of Systems Pertinent positive and negative review of systems were noted in the above HPI section.  All other review of systems was otherwise negative.  Outpatient  Encounter Medications as of 10/18/2018  Medication Sig  . albuterol (PROVENTIL HFA;VENTOLIN HFA) 108 (90 Base) MCG/ACT inhaler Inhale 2 puffs into the lungs every 6 (six) hours as needed for wheezing or shortness of breath.  . budesonide-formoterol (SYMBICORT) 80-4.5 MCG/ACT inhaler USE 2 PUFFS 2 TIMES DAILY  . fluconazole (DIFLUCAN) 150 MG tablet Take 2 tabs daily x 7 days then 2 tabs po weekly x 3 weeks.  .Marland KitchenHYDROcodone-homatropine (HYCODAN) 5-1.5 MG/5ML syrup Take 5 mLs by mouth every 8 (eight) hours as needed for cough.  . mometasone-formoterol (DULERA) 100-5 MCG/ACT AERO Inhale 2 puffs into the lungs daily.  .Marland Kitchentriamcinolone cream (KENALOG) 0.1 % Apply 1 application topically 2 (two) times daily.  .Marland Kitchendicyclomine (BENTYL) 10 MG capsule Take 1 capsule (10 mg total) by mouth every 6 (six) hours as needed for spasms. As needed for abdominal pain/ cramping  . ondansetron (ZOFRAN) 4 MG tablet Take 1 every 6-8 hours as needed for nausea  . [DISCONTINUED] Vitamin D, Ergocalciferol, (DRISDOL) 50000 units CAPS capsule Take 1 capsule (50,000 Units total) every 7 (seven) days by mouth.   No facility-administered encounter medications on file as of 10/18/2018.    Allergies  Allergen Reactions  . Doxycycline Nausea And Vomiting  . Erythromycin Base     Other reaction(s): GI Upset (intolerance)   Patient Active Problem List   Diagnosis Date Noted  . Diarrhea 09/24/2018  . Vitamin D deficiency 01/02/2017  . Welcome to Medicare preventive visit 06/29/2016  . Pruritus  06/29/2016  . Abnormal CT scan 08/14/2015  . Constipation 10/06/2014  . 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 Rib pain on left side 09/08/2014  . Chronic cough 06/14/2014  . SOB (shortness of breath) 06/14/2014  . Cough 05/20/2014  . Hyperlipidemia, mild 08/15/2011  . Osteopenia 03/01/2011  . Hyperplastic colon polyp 03/01/2011  . History of smoking 03/01/2011  . Musculoskeletal pain 03/01/2011   Social History   Socioeconomic History  .  Marital status: Significant Other    Spouse name: Not on file  . Number of children: 3  . Years of education: Not on file  . Highest education level: Not on file  Occupational History  . 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 Financial resource strain: Not on file  . Food insecurity    Worry: Not on file    Inability: Not on file  . Transportation needs    Medical: Not on file    Non-medical: Not on file  Tobacco Use  . Smoking status: Former Smoker    Packs/day: 1.00    Years: 48.00    Pack years: 48.00    Types: Cigarettes    Quit date: 02/19/2014    Years since quitting: 4.6  . Smokeless tobacco: Never Used  . Tobacco comment: Quit and has no desire to smoke again,  Substance and Sexual Activity  . Alcohol use: Yes    Alcohol/week: 0.0 standard drinks    Comment: socially  . Drug use: No  . Sexual activity: Yes    Partners: Male  Lifestyle  . Physical activity    Days per week: Not on file    Minutes per session: Not on file  . Stress: Not on file  Relationships  . Social Herbalist on phone: Not on file    Gets together: Not on file    Attends religious service: Not on file    Active member of club or organization: Not on file    Attends meetings of clubs or organizations: Not on file    Relationship status: Not on file  . Intimate partner violence    Fear of current or ex partner: Not on file    Emotionally abused: Not on file    Physically abused: Not on file    Forced sexual activity: Not on file  Other Topics Concern  . Not on file  Social History Narrative   Exercise-- walks dog on occassion    Danielle Fox's family history includes Alcohol abuse in her father; Cirrhosis in her father; Heart disease in her father and mother.      Objective:    Vitals:   10/18/18 1114  BP: 90/60  Pulse: 98  Temp: 97.9 F (36.6 C)    Physical Exam Well-developed well-nourished older white female in no acute  distress, pleasant.  Height, Weight, 121 BMI 22.8  HEENT; nontraumatic normocephalic, EOMI, PE R LA, sclera anicteric. Oropharynx; not examined/COVID/wearing mask Neck; supple, no JVD Cardiovascular; regular rate and rhythm with S1-S2, no murmur rub or gallop Pulmonary; Clear bilaterally Abdomen; soft, she has some mild tenderness in the epigastrium and hypogastrium, nondistended, no palpable mass or hepatosplenomegaly, bowel sounds are active Rectal; not done today Skin; benign exam, no jaundice rash or appreciable lesions Extremities; no clubbing cyanosis or edema skin warm and dry Neuro/Psych; alert and oriented x4, grossly nonfocal mood and affect appropriate      Assessment & Plan:   #[age] year old white  female with 68-monthhistory of diarrhea, abdominal cramping and upper abdominal pain as well as intermittent nausea and sporadic vomiting.  Patient describes episodes of diarrhea lasting several days, followed by perhaps no bowel movement for a day or 2 and then the cycle starts over again.  Generally having at least 3-4 liquid bowel movements per day. No improvement in symptoms with empiric course of Diflucan.  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  #2 history of hyperplastic colon polyps-up-to-date with colonoscopy last done December 2016  Plan; Will check GI pathogen panel, stool for C. difficile by PCR and fecal lactoferrin Start Zofran 4 mg every 6 hours PRN for nausea Start Bentyl 10 mg p.o. every 6 hours PRN for abdominal cramping and diarrhea Patient can stop brat diet, and advance to bland low residue foods. Will go ahead and schedule for colonoscopy and EGD with Dr. GCarlean Fox  These will intentionally be scheduled out about 2 weeks to allow time for stool studies to result.  Both procedures were discussed in detail with patient  including indications risks and benefits. If colonoscopy is normal she would need random biopsies of the colon, and also will need small bowel biopsies with EGD to rule out eosinophilic gastroenteritis.  Danielle Fox S Kelon Easom PA-C 10/18/2018   Cc: BMosie Lukes MD  Agree with Ms. EGenia Haroldassessment and plan.  C diff PCR + and Tx started  We should cancel the EGD colonoscopy and arrange a f/u visit w/ me or AM instead to reassess and then determine if any additional evaluation needed.  C diff may explain all of her problems  CGatha Mayer MD, FM[facility]awk [facility], [facility]

## 2018-10-19 ENCOUNTER — Other Ambulatory Visit: Payer: Medicare HMO

## 2018-10-19 DIAGNOSIS — R197 Diarrhea, unspecified: Secondary | ICD-10-CM | POA: Diagnosis not present

## 2018-10-19 DIAGNOSIS — A498 Other bacterial infections of unspecified site: Secondary | ICD-10-CM

## 2018-10-19 HISTORY — DX: Other bacterial infections of unspecified site: A49.8

## 2018-10-22 ENCOUNTER — Encounter: Payer: Self-pay | Admitting: Family Medicine

## 2018-10-22 ENCOUNTER — Other Ambulatory Visit: Payer: Self-pay | Admitting: Family Medicine

## 2018-10-22 DIAGNOSIS — A0472 Enterocolitis due to Clostridium difficile, not specified as recurrent: Secondary | ICD-10-CM

## 2018-10-22 MED ORDER — METRONIDAZOLE 500 MG PO TABS: 500.0000 mg | ORAL_TABLET | Freq: Three times a day (TID) | ORAL | 0 refills | Status: AC

## 2018-10-24 ENCOUNTER — Other Ambulatory Visit: Payer: Self-pay

## 2018-10-24 MED ORDER — VANCOMYCIN HCL 125 MG PO CAPS: 125.0000 mg | ORAL_CAPSULE | Freq: Four times a day (QID) | ORAL | 0 refills | Status: AC

## 2018-10-25 LAB — GASTROINTESTINAL PATHOGEN PANEL PCR
C. difficile Tox A/B, PCR: NOT DETECTED
Campylobacter, PCR: NOT DETECTED
Cryptosporidium, PCR: NOT DETECTED
E coli (ETEC) LT/ST PCR: NOT DETECTED
E coli (STEC) stx1/stx2, PCR: NOT DETECTED
E coli 0157, PCR: NOT DETECTED
Giardia lamblia, PCR: NOT DETECTED
Norovirus, PCR: NOT DETECTED
Rotavirus A, PCR: NOT DETECTED
Salmonella, PCR: NOT DETECTED
Shigella, PCR: NOT DETECTED

## 2018-10-25 LAB — FECAL LACTOFERRIN, QUANT
Fecal Lactoferrin: NEGATIVE
MICRO NUMBER:: 793334
SPECIMEN QUALITY:: ADEQUATE

## 2018-10-25 LAB — CLOSTRIDIUM DIFFICILE TOXIN B, QUALITATIVE, REAL-TIME PCR: Toxigenic C. Difficile by PCR: DETECTED — AB

## 2018-11-01 ENCOUNTER — Telehealth: Payer: Self-pay

## 2018-11-01 NOTE — Telephone Encounter (Signed)
Alfredia Ferguson, PA-C sent to [facility], Real Cons, LPN        Beth- please call pt and see how she is feeling regarding Cdiff colitis . Would cancel EGD and Colon with Carlean Purl for now as most of her sxs should resolve with resolution of cdiff infection .   Previous Messages  ----- Message -----  From: Gatha Mayer, MD  Sent: 10/28/2018  6:21 PM EDT  To: Alfredia Ferguson, PA-C, Mosie Lukes, MD          Called the patient and advised of the recommendation to cancel. She reports she is seeing improvement. She is not certain she wants to cancel the procedures. She will complete the vancomycin 11/07/18. She will give an update on 11/08/18.  Procedures are cancelled at this time.

## 2018-11-15 ENCOUNTER — Other Ambulatory Visit: Payer: Medicare HMO

## 2018-11-15 ENCOUNTER — Telehealth: Payer: Self-pay | Admitting: Physician Assistant

## 2018-11-15 DIAGNOSIS — R197 Diarrhea, unspecified: Secondary | ICD-10-CM

## 2018-11-15 MED ORDER — VANCOMYCIN HCL 125 MG PO CAPS: 125.0000 mg | ORAL_CAPSULE | Freq: Four times a day (QID) | ORAL | 3 refills | Status: DC

## 2018-11-15 NOTE — Telephone Encounter (Signed)
Recent treatment of C-diff. Finished her vancomycin last week about 11/08/18.  She began having stomach cramping "like last time" and diarrhea 2 to 3 times a day in the last 48 hours. She states she feels like she did before.

## 2018-11-15 NOTE — Telephone Encounter (Signed)
Called back. No answer. Got the voicemail. Left her a message to call us back. I will also try her again this afternoon.

## 2018-11-15 NOTE — Telephone Encounter (Signed)
Per our conversation, the patient will submit a stool specimen for C Diff by PCR. She will then restart Vancomycin 125 mg QID x 2 week and Florastor BID for 30 days. She expresses understanding of the importance of maintaining adequate hydration. Agrees to this plan of care.  Advised further instructions will be given after the stool study is resulted.

## 2018-11-15 NOTE — Telephone Encounter (Signed)
She should start vancomycin and not worry about doing a c diff test before starting the treatment   That is to say don't delay treatment for the test. Start the vancomycin today regardless of the test

## 2018-11-15 NOTE — Telephone Encounter (Signed)
Related this information to the patient via voicemail.

## 2018-11-16 ENCOUNTER — Other Ambulatory Visit: Payer: Medicare HMO

## 2018-11-16 DIAGNOSIS — R197 Diarrhea, unspecified: Secondary | ICD-10-CM

## 2018-11-17 ENCOUNTER — Other Ambulatory Visit: Payer: Self-pay | Admitting: Family Medicine

## 2018-11-17 LAB — CLOSTRIDIUM DIFFICILE BY PCR: Toxigenic C. Difficile by PCR: NEGATIVE

## 2018-11-17 NOTE — Telephone Encounter (Signed)
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  Not going to dc the c diff over the weekend

## 2018-11-20 ENCOUNTER — Other Ambulatory Visit: Payer: Self-pay

## 2018-11-20 ENCOUNTER — Encounter: Payer: Self-pay | Admitting: Internal Medicine

## 2018-11-20 DIAGNOSIS — R112 Nausea with vomiting, unspecified: Secondary | ICD-10-CM

## 2018-11-20 DIAGNOSIS — R109 Unspecified abdominal pain: Secondary | ICD-10-CM

## 2018-11-20 DIAGNOSIS — R197 Diarrhea, unspecified: Secondary | ICD-10-CM

## 2018-11-20 NOTE — Telephone Encounter (Addendum)
Patinet had upper abd cramps and nausea and vomiting yesterday   Plan  Stop Vancomycin (C diff is negative)   EGD/colonoscopy 0900 slot (single slot) Wed 9/23  Dx is Nausea and vomiting, epigastric pain and diarrhea  Lanelle Bal has oked that I do this    Patient may still have her prep instructions, etc from original visit where she was set up for colonoscopy so should be able to simply adjust those for the time changes and avoid a previdit   Thanks

## 2018-11-20 NOTE — Telephone Encounter (Signed)
Spoke with the patient. Reviewed her prep instructions and her arrival time. She states she will call me if she encounters anything she does not feel clear in understanding. Patient states she will arive for her procedure at 8:00 am.

## 2018-11-21 ENCOUNTER — Telehealth: Payer: Self-pay

## 2018-11-21 NOTE — Telephone Encounter (Signed)
Covid-19 screening questions   Do you now or have you had a fever in the last 14 days? NO   Do you have any respiratory symptoms of shortness of breath or cough now or in the last 14 days? NO  Do you have any family members or close contacts with diagnosed or suspected Covid-19 in the past 14 days? NO  Have you been tested for Covid-19 and found to be positive? NO        

## 2018-11-22 ENCOUNTER — Encounter: Payer: Medicare HMO | Admitting: Internal Medicine

## 2018-11-22 ENCOUNTER — Ambulatory Visit (AMBULATORY_SURGERY_CENTER): Payer: Medicare HMO | Admitting: Internal Medicine

## 2018-11-22 ENCOUNTER — Other Ambulatory Visit: Payer: Self-pay | Admitting: Internal Medicine

## 2018-11-22 ENCOUNTER — Encounter: Payer: Self-pay | Admitting: Internal Medicine

## 2018-11-22 ENCOUNTER — Other Ambulatory Visit: Payer: Self-pay

## 2018-11-22 VITALS — BP 106/48 | HR 71 | Temp 97.7°F | Resp 12 | Ht 61.0 in | Wt 121.0 lb

## 2018-11-22 DIAGNOSIS — A498 Other bacterial infections of unspecified site: Secondary | ICD-10-CM | POA: Insufficient documentation

## 2018-11-22 DIAGNOSIS — R112 Nausea with vomiting, unspecified: Secondary | ICD-10-CM | POA: Diagnosis not present

## 2018-11-22 DIAGNOSIS — R197 Diarrhea, unspecified: Secondary | ICD-10-CM | POA: Diagnosis not present

## 2018-11-22 DIAGNOSIS — K311 Adult hypertrophic pyloric stenosis: Secondary | ICD-10-CM | POA: Diagnosis not present

## 2018-11-22 DIAGNOSIS — K3189 Other diseases of stomach and duodenum: Secondary | ICD-10-CM | POA: Diagnosis not present

## 2018-11-22 DIAGNOSIS — R109 Unspecified abdominal pain: Secondary | ICD-10-CM

## 2018-11-22 DIAGNOSIS — K5730 Diverticulosis of large intestine without perforation or abscess without bleeding: Secondary | ICD-10-CM | POA: Diagnosis not present

## 2018-11-22 DIAGNOSIS — K2970 Gastritis, unspecified, without bleeding: Secondary | ICD-10-CM | POA: Diagnosis not present

## 2018-11-22 HISTORY — PX: UPPER GASTROINTESTINAL ENDOSCOPY: SHX188

## 2018-11-22 MED ORDER — ONDANSETRON HCL 4 MG PO TABS: 4.0000 mg | ORAL_TABLET | Freq: Three times a day (TID) | ORAL | 1 refills | Status: DC | PRN

## 2018-11-22 MED ORDER — ESOMEPRAZOLE MAGNESIUM 40 MG PO CPDR: 40.0000 mg | DELAYED_RELEASE_CAPSULE | Freq: Every day | ORAL | 1 refills | Status: DC

## 2018-11-22 MED ORDER — SODIUM CHLORIDE 0.9 % IV SOLN: 500.0000 mL | Freq: Once | INTRAVENOUS | Status: DC

## 2018-11-22 MED ORDER — SODIUM CHLORIDE 0.9 % IV SOLN
4.0000 mg | Freq: Once | INTRAVENOUS | Status: AC
  Administered 2018-11-22: 4 mg via INTRAVENOUS

## 2018-11-22 NOTE — Patient Instructions (Addendum)
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 This is causing the pain and vomiting.  I do not see cancer but took multiple biopsies looking for cause.  I am staring medication to treat the ulcer/stenosis. Rx sent to [facility]. Esomeprazole and also ondansetron for nausea.  I dilated the stenosis and you should be better now though healing needs to occur with use of medication.  Cause of diarrhea not clear - could have been the C diff.  Stay off the vancomycin but keep it.  I appreciate the opportunity to care for you. Gatha Mayer, MD, FACG  YOU HAD AN ENDOSCOPIC PROCEDURE TODAY AT [facility]:   Refer to the procedure report that was given to you for any specific questions about what was found during the examination.  If the procedure report does not answer your questions, please call your gastroenterologist to clarify.  If you requested that your care partner not be given the details of your procedure findings, then the procedure report has been included in a sealed envelope for you to review at your convenience later.  YOU SHOULD EXPECT: Some feelings of bloating in the abdomen. Passage of more gas than usual.  Walking can help get rid of the air that was put into your GI tract during the procedure and reduce the bloating. If you had a lower endoscopy (such as a colonoscopy or flexible sigmoidoscopy) you may notice spotting of blood in your stool or on the toilet paper. If you underwent a bowel prep for your procedure, you may not have a normal bowel movement for a few days.  Please Note:  You might notice some irritation and congestion in your nose or some drainage.  This is from the oxygen used during your procedure.  There is no need for concern and it should clear up in a day or so.  SYMPTOMS TO REPORT IMMEDIATELY:   Following lower endoscopy (colonoscopy or flexible sigmoidoscopy):  Excessive amounts of blood in the stool  Significant tenderness  or worsening of abdominal pains  Swelling of the abdomen that is new, acute  Fever of 100F or higher   Following upper endoscopy (EGD)  Vomiting of blood or coffee ground material  New chest pain or pain under the shoulder blades  Painful or persistently difficult swallowing  New shortness of breath  Fever of 100F or higher  Black, tarry-looking stools  For urgent or emergent issues, a gastroenterologist can be reached at any hour by calling [phone].   DIET:  We do recommend a small meal at first, but then you may proceed to your regular diet.  Drink plenty of fluids but you should avoid alcoholic beverages for 24 hours.  ACTIVITY:  You should plan to take it easy for the rest of today and you should NOT DRIVE or use heavy machinery until tomorrow (because of the sedation medicines used during the test).    FOLLOW UP: Our staff will call the number listed on your records 48-72 hours following your procedure to check on you and address any questions or concerns that you may have regarding the information given to you following your procedure. If we do not reach you, we will leave a message.  We will attempt to reach you two times.  During this call, we will ask if you have developed any symptoms of COVID 19. If you develop any symptoms (ie: fever, flu-like symptoms, shortness of breath, cough etc.) before then, please call (  [phone].  If you test positive for Covid 19 in the 2 weeks post procedure, please call and report this information to [place].    If any biopsies were taken you will be contacted by phone or by letter within the next 1-3 weeks.  Please call us at [phone] if you have not heard about the biopsies in 3 weeks.    SIGNATURES/CONFIDENTIALITY: You and/or your care partner have signed paperwork which will be entered into your electronic medical record.  These signatures attest to the fact that that the information above on your After Visit Summary has been  reviewed and is understood.  Full responsibility of the confidentiality of this discharge information lies with you and/or your care-partner.

## 2018-11-22 NOTE — Progress Notes (Signed)
[place] vitals

## 2018-11-22 NOTE — Progress Notes (Signed)
PT taken to PACU. Monitors in place. VSS. Report given to RN. 

## 2018-11-22 NOTE — Op Note (Signed)
[place] Patient Name: Danielle Fox Procedure Date: 11/22/2018 8:54 AM MRN: [id number] Endoscopist: Gatha Mayer , MD Age: 68 Referring MD:  Date of Birth: 11-08-1950 Gender: Female Account #: [id number] Procedure:                Colonoscopy Indications:              Generalized abdominal pain, Clinically significant                            diarrhea of unexplained origin Medicines:                Propofol per Anesthesia, Monitored Anesthesia Care Procedure:                Pre-Anesthesia Assessment:                           - Prior to the procedure, a History and Physical                            was performed, and patient medications and                            allergies were reviewed. The patient's tolerance of                            previous anesthesia was also reviewed. The risks                            and benefits of the procedure and the sedation                            options and risks were discussed with the patient.                            All questions were answered, and informed consent                            was obtained. Prior Anticoagulants: The patient has                            taken no previous anticoagulant or antiplatelet                            agents. ASA Grade Assessment: II - A patient with                            mild systemic disease. After reviewing the risks                            and benefits, the patient was deemed in                            satisfactory condition to undergo the procedure.  After obtaining informed consent, the colonoscope                            was passed under direct vision. Throughout the                            procedure, the patient's blood pressure, pulse, and                            oxygen saturations were monitored continuously. The                            Colonoscope was introduced through the anus and                             advanced to the the terminal ileum, with                            identification of the appendiceal orifice and IC                            valve. The colonoscopy was somewhat difficult due                            to significant looping. Successful completion of                            the procedure was aided by applying abdominal                            pressure. The patient tolerated the procedure well.                            The quality of the bowel preparation was good. The                            terminal ileum, ileocecal valve, appendiceal                            orifice, and rectum were photographed. The bowel                            preparation used was Miralax via split dose                            instruction. Scope In: 9:24:29 AM Scope Out: 9:41:11 AM Scope Withdrawal Time: 0 hours 10 minutes 49 seconds  Total Procedure Duration: 0 hours 16 minutes 42 seconds  Findings:                 The perianal and digital rectal examinations were                            normal.  Multiple diverticula were found in the entire colon.                           An extrinsic severe stenosis was found in the                            sigmoid colon and was traversed.                           The terminal ileum appeared normal. Biopsies were                            taken with a cold forceps for histology.                            Verification of patient identification for the                            specimen was done. Estimated blood loss was minimal.                           The exam was otherwise without abnormality on                            direct and retroflexion views.                           Biopsies for histology were taken with a cold                            forceps from the entire colon for evaluation of                            microscopic colitis. Complications:            No immediate  complications. Estimated Blood Loss:     Estimated blood loss was minimal. Impression:               - Diverticulosis in the entire examined colon.                           - Stricture in the sigmoid colon.                           - The examined portion of the ileum was normal.                            Biopsied.                           - The examination was otherwise normal on direct                            and retroflexion views.                           -  Biopsies were taken with a cold forceps from the                            entire colon for evaluation of microscopic colitis.                           NO SIGNS OF RECENTLY TREATED C DIFF Recommendation:           - Patient has a contact number available for                            emergencies. The signs and symptoms of potential                            delayed complications were discussed with the                            patient. Return to normal activities tomorrow.                            Written discharge instructions were provided to the                            patient.                           - Resume previous diet.                           - Continue present medications.                           - Await pathology results.                           - See the other procedure note for documentation of                            additional recommendations. Gatha Mayer, MD 11/22/2018 9:57:34 AM This report has been signed electronically.

## 2018-11-22 NOTE — Op Note (Signed)
[place] Patient Name: Danielle Fox Procedure Date: 11/22/2018 8:54 AM MRN: [id number] Endoscopist: Gatha Mayer , MD Age: 68 Referring MD:  Date of Birth: Apr 13, 1950 Gender: Female Account #: [id number] Procedure:                Upper GI endoscopy Indications:              Generalized abdominal pain, Diarrhea, Nausea with                            vomiting Medicines:                Propofol per Anesthesia, Monitored Anesthesia Care Procedure:                Pre-Anesthesia Assessment:                           - Prior to the procedure, a History and Physical                            was performed, and patient medications and                            allergies were reviewed. The patient's tolerance of                            previous anesthesia was also reviewed. The risks                            and benefits of the procedure and the sedation                            options and risks were discussed with the patient.                            All questions were answered, and informed consent                            was obtained. Prior Anticoagulants: The patient has                            taken no previous anticoagulant or antiplatelet                            agents. ASA Grade Assessment: II - A patient with                            mild systemic disease. After reviewing the risks                            and benefits, the patient was deemed in                            satisfactory condition to undergo the procedure.  After obtaining informed consent, the endoscope was                            passed under direct vision. Throughout the                            procedure, the patient's blood pressure, pulse, and                            oxygen saturations were monitored continuously. The                            Endoscope was introduced through the mouth, and                            advanced to the second  part of duodenum. The upper                            GI endoscopy was somewhat difficult due to                            stenosis. Successful completion of the procedure                            was aided by performing the maneuvers documented                            (below) in this report. The patient tolerated the                            procedure well. Scope In: Scope Out: Findings:                 A benign-appearing, intrinsic severe stenosis was                            found at the pylorus. This was traversed. Biopsies                            were taken with a cold forceps for histology.                            Verification of patient identification for the                            specimen was done. Estimated blood loss was                            minimal. A TTS dilator was passed through the                            scope. Dilation with an 8.5-9.5-10.5 mm and an  01-11-12 mm pyloric balloon dilator was performed.                            The dilation site was examined and showed moderate                            improvement in luminal narrowing.                           Patchy mild inflammation characterized by erythema                            was found in the gastric antrum. Biopsies were                            taken with a cold forceps for histology.                            Verification of patient identification for the                            specimen was done. Estimated blood loss was minimal.                           The exam was otherwise without abnormality.                           The cardia and gastric fundus were normal on                            retroflexion.                           Biopsies were taken with a cold forceps in the                            second portion of the duodenum for histology.                            Estimated blood loss was minimal. Complications:            No  immediate complications. Estimated Blood Loss:     Estimated blood loss was minimal. Impression:               - Gastric stenosis was found at the pylorus.                            Biopsied. Dilated.                           - Gastritis. Biopsied.                           - The examination was otherwise normal.                           -  Biopsies were taken with a cold forceps for                            histology in the second portion of the duodenum. Recommendation:           - Patient has a contact number available for                            emergencies. The signs and symptoms of potential                            delayed complications were discussed with the                            patient. Return to normal activities tomorrow.                            Written discharge instructions were provided to the                            patient.                           - Resume previous diet.                           - Continue present medications.                           - Await pathology results.                           - Use Nexium (esomeprazole) 40 mg PO daily.                           - See the other procedure note for documentation of                            additional recommendations. Gatha Mayer, MD 11/22/2018 9:53:53 AM This report has been signed electronically.

## 2018-11-22 NOTE — Progress Notes (Signed)
Called to room to assist during endoscopic procedure.  Patient ID and intended procedure confirmed with present staff. Received instructions for my participation in the procedure from the performing physician.  

## 2018-11-24 ENCOUNTER — Telehealth: Payer: Self-pay

## 2018-11-24 NOTE — Telephone Encounter (Signed)
  Follow up Call-  Call back number 11/22/2018  Post procedure Call Back phone  # [phone]  Permission to leave phone message Yes  Some recent data might be hidden     Left message

## 2018-11-24 NOTE — Telephone Encounter (Signed)
  Follow up Call-  Call back number 11/22/2018  Post procedure Call Back phone  # [phone]  Permission to leave phone message Yes  Some recent data might be hidden     Left message

## 2018-11-29 NOTE — Progress Notes (Signed)
[facility] - no letter and no recall - remove any existing recalls  Office  Please tell her the biopsies are all ok - some mild stomach lining changes only abnormality and I do not thin an issue  Please get an update on diarrhea and abd pain  Frequency and consistency of stools please

## 2019-01-22 ENCOUNTER — Ambulatory Visit (HOSPITAL_BASED_OUTPATIENT_CLINIC_OR_DEPARTMENT_OTHER)
Admission: RE | Admit: 2019-01-22 | Discharge: 2019-01-22 | Disposition: A | Discharge status: Home / Self Care | Payer: Medicare HMO | Source: Ambulatory Visit | Attending: Acute Care | Admitting: Acute Care

## 2019-01-22 ENCOUNTER — Other Ambulatory Visit: Payer: Self-pay

## 2019-01-22 DIAGNOSIS — Z122 Encounter for screening for malignant neoplasm of respiratory organs: Secondary | ICD-10-CM | POA: Insufficient documentation

## 2019-01-22 DIAGNOSIS — Z87891 Personal history of nicotine dependence: Secondary | ICD-10-CM

## 2019-01-29 DIAGNOSIS — R69 Illness, unspecified: Secondary | ICD-10-CM | POA: Diagnosis not present

## 2019-01-29 MED FILL — FLUAD QUADRIVALENT 0.5 ML P: 0.5 | 1 days supply | Qty: 1 | Fill #0

## 2019-02-01 ENCOUNTER — Telehealth: Payer: Self-pay | Admitting: Acute Care

## 2019-02-01 DIAGNOSIS — Z87891 Personal history of nicotine dependence: Secondary | ICD-10-CM

## 2019-02-01 DIAGNOSIS — Z122 Encounter for screening for malignant neoplasm of respiratory organs: Secondary | ICD-10-CM

## 2019-02-01 NOTE — Telephone Encounter (Signed)
Pt informed of CT results per Sarah Groce, NP.  PT verbalized understanding.  Copy sent to PCP.  Order placed for 1 yr f/u CT.  

## 2019-03-07 NOTE — Progress Notes (Signed)
Virtual Visit via Video Note  I connected with patient on 03/08/19 at 11:00 AM EST by audio enabled telemedicine application and verified that I am speaking with the correct person using two identifiers.   THIS ENCOUNTER IS A VIRTUAL VISIT DUE TO COVID-19 - PATIENT WAS NOT SEEN IN THE OFFICE. PATIENT HAS CONSENTED TO VIRTUAL VISIT / TELEMEDICINE VISIT   Location of patient: home  Location of provider: office  I discussed the limitations of evaluation and management by telemedicine and the availability of in person appointments. The patient expressed understanding and agreed to proceed.   Subjective:   Danielle Fox is a 69 y.o. female who presents for Medicare Annual (Subsequent) preventive examination.  Review of Systems:  Home Safety/Smoke Alarms: Feels safe in home. Smoke alarms in place.  Lives w/ significant other and dog in 1 story home.    Female:   Mammo- declines    Dexa scan- declines [facility]- 11/22/18.     Objective:     Vitals: Unable to assess. This visit is enabled though telemedicine due to Covid 19.   Advanced Directives 03/08/2019 01/17/2015 06/14/2014  Does Patient Have a Medical Advance Directive? Yes Yes Yes  Type of Paramedic of [place];Living will [place];Living will [place];Living will  Does patient want to make changes to medical advance directive? No - Patient declined - -  Copy of [place] in Chart? No - copy requested - -    Tobacco Social History   Tobacco Use  Smoking Status Former Smoker  . Packs/day: 1.00  . Years: 48.00  . Pack years: 48.00  . Types: Cigarettes  . Quit date: 02/19/2014  . Years since quitting: 5.0  Smokeless Tobacco Never Used  Tobacco Comment   Quit and has no desire to smoke again,     Counseling given: Not Answered Comment: Quit and has no desire to smoke again,   Clinical Intake: Pain : No/denies pain     Past Medical  History:  Diagnosis Date  . Allergy   . Clostridioides difficile infection 10/19/2018   tested 10/2018 negative  . Colon polyps   . Constipation 10/06/2014  . Osteopenia   . 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 Preventative health care 06/29/2016  . Pruritus 06/29/2016  . Welcome to Medicare preventive visit 06/29/2016   Past Surgical History:  Procedure Laterality Date  . ABDOMINAL HYSTERECTOMY    . APPENDECTOMY    . COLONOSCOPY     01-14-2005  . SINUS IRRIGATION  04/02/2015  . UPPER GASTROINTESTINAL ENDOSCOPY  11/22/2018   Family History  Problem Relation Age of Onset  . Heart disease Mother        cabg, MI  . Alcohol abuse Father   . Cirrhosis Father   . Heart disease Father        MI  . Colon cancer Neg Hx   . Colon polyps Neg Hx   . Esophageal cancer Neg Hx   . Stomach cancer Neg Hx   . Rectal cancer Neg Hx    Social History   Socioeconomic History  . Marital status: Significant Other    Spouse name: Not on file  . Number of children: 3  . Years of education: Not on file  . Highest education level: Not on file  Occupational History  . 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 Smoking status:  Former Smoker    Packs/day: 1.00    Years: 48.00    Pack years: 48.00    Types: Cigarettes    Quit date: 02/19/2014    Years since quitting: 5.0  . Smokeless tobacco: Never Used  . Tobacco comment: Quit and has no desire to smoke again,  Substance and Sexual Activity  . Alcohol use: Yes    Alcohol/week: 0.0 standard drinks    Comment: socially  . Drug use: No  . Sexual activity: Yes    Partners: Male  Other Topics Concern  . Not on file  Social History Narrative   Exercise-- walks dog on occassion   Social Determinants of Health   Financial Resource Strain:   . Difficulty of Paying Living Expenses: Not on file  Food Insecurity:   . Worried About Charity fundraiser in the Last Year: Not on file  . Ran Out of Food in the  Last Year: Not on file  Transportation Needs:   . Lack of Transportation (Medical): Not on file  . Lack of Transportation (Non-Medical): Not on file  Physical Activity:   . Days of Exercise per Week: Not on file  . Minutes of Exercise per Session: Not on file  Stress:   . Feeling of Stress : Not on file  Social Connections:   . Frequency of Communication with Friends and Family: Not on file  . Frequency of Social Gatherings with Friends and Family: Not on file  . Attends Religious Services: Not on file  . Active Member of Clubs or Organizations: Not on file  . Attends Archivist Meetings: Not on file  . Marital Status: Not on file    Outpatient Encounter Medications as of 03/08/2019  Medication Sig  . budesonide-formoterol (SYMBICORT) 80-4.5 MCG/ACT inhaler USE 2 PUFFS 2 TIMES DAILY  . albuterol (PROVENTIL HFA;VENTOLIN HFA) 108 (90 Base) MCG/ACT inhaler Inhale 2 puffs into the lungs every 6 (six) hours as needed for wheezing or shortness of breath. (Patient not taking: Reported on 03/08/2019)  . triamcinolone cream (KENALOG) 0.1 % Apply 1 application topically 2 (two) times daily. (Patient not taking: Reported on 03/08/2019)  . [DISCONTINUED] dicyclomine (BENTYL) 10 MG capsule Take 1 capsule (10 mg total) by mouth every 6 (six) hours as needed for spasms. As needed for abdominal pain/ cramping  . [DISCONTINUED] esomeprazole (NEXIUM) 40 MG capsule Take 1 capsule (40 mg total) by mouth daily before breakfast.  . [DISCONTINUED] mometasone-formoterol (DULERA) 100-5 MCG/ACT AERO Inhale 2 puffs into the lungs daily. (Patient not taking: Reported on 11/22/2018)  . [DISCONTINUED] ondansetron (ZOFRAN) 4 MG tablet Take 1 tablet (4 mg total) by mouth every 8 (eight) hours as needed for nausea or vomiting.   No facility-administered encounter medications on file as of 03/08/2019.    Activities of Daily Living In your present state of health, do you have any difficulty performing the following  activities: 03/08/2019  Hearing? N  Vision? N  Difficulty concentrating or making decisions? N  Walking or climbing stairs? N  Dressing or bathing? N  Doing errands, shopping? N  Preparing Food and eating ? N  Using the Toilet? N  In the past six months, have you accidently leaked urine? Y  Do you have problems with loss of bowel control? N  Managing your Medications? N  Managing your Finances? N  Housekeeping or managing your Housekeeping? N  Some recent data might be hidden    Patient Care Team: Mosie Lukes, MD as  PCP - General (Family Medicine)    Assessment:   This is a routine wellness examination for Danielle Fox. Physical assessment deferred to PCP.  Exercise Activities and Dietary recommendations Current Exercise Habits: Home exercise routine, Type of exercise: walking, Time (Minutes): 15, Frequency (Times/Week): 7, Weekly Exercise (Minutes/Week): 105, Intensity: Mild, Exercise limited by: None identified Diet (meal preparation, eat out, water intake, caffeinated beverages, dairy products, fruits and vegetables): well balanced     Goals    . Patient Stated     Maintain healthy active lifestyle.        Fall Risk Fall Risk  03/08/2019 12/28/2016 06/29/2016 12/23/2015 12/14/2012  Falls in the past year? 0 No No No No  Follow up Education provided;Falls prevention discussed - - - -    Depression Screen PHQ 2/9 Scores 03/08/2019 12/28/2016 06/29/2016 12/23/2015  PHQ - 2 Score 0 0 0 0     Cognitive Function Ad8 score reviewed for issues:  Issues making decisions:no  Less interest in hobbies / activities:no  Repeats questions, stories (family complaining):no  Trouble using ordinary gadgets (microwave, computer, phone):no  Forgets the month or year: no  Mismanaging finances: no  Remembering appts:no  Daily problems with thinking and/or memory:no Ad8 score is=0        Immunization History  Administered Date(s) Administered  . Fluad Quad(high Dose 65+)  01/29/2019  . Influenza Split 01/07/2011  . Influenza Whole 11/29/2009  . Influenza, High Dose Seasonal PF 11/07/2017  . Influenza,inj,Quad PF,6+ Mos 12/14/2012  . Influenza-Unspecified 12/28/2013, 12/29/2014, 12/01/2015, 12/25/2016  . Pneumococcal Conjugate-13 06/29/2016  . Tdap 03/01/2001, 06/29/2016   Screening Tests Health Maintenance  Topic Date Due  . Hepatitis C Screening  [birth year]  . PNA vac Low Risk Adult (2 of 2 - PPSV23) 06/29/2017  . MAMMOGRAM  01/05/2018  . TETANUS/TDAP  06/30/2026  . COLONOSCOPY  11/21/2028  . INFLUENZA VACCINE  Completed  . DEXA SCAN  Completed      Plan:   See you next year!  Continue to eat heart healthy diet (full of fruits, vegetables, whole grains, lean protein, water--limit salt, fat, and sugar intake) and increase physical activity as tolerated.  Continue doing brain stimulating activities (puzzles, reading, adult coloring books, staying active) to keep memory sharp.   Bring a copy of your living will and/or healthcare power of attorney to your next office visit.    I have personally reviewed and noted the following in the patient's chart:   . Medical and social history . Use of alcohol, tobacco or illicit drugs  . Current medications and supplements . Functional ability and status . Nutritional status . Physical activity . Advanced directives . List of other physicians . Hospitalizations, surgeries, and ER visits in previous 12 months . Vitals . Screenings to include cognitive, depression, and falls . Referrals and appointments  In addition, I have reviewed and discussed with patient certain preventive protocols, quality metrics, and best practice recommendations. A written personalized care plan for preventive services as well as general preventive health recommendations were provided to patient.     Shela Nevin, [place]  03/08/2019

## 2019-03-08 ENCOUNTER — Other Ambulatory Visit: Payer: Self-pay

## 2019-03-08 ENCOUNTER — Encounter: Payer: Self-pay | Admitting: *Deleted

## 2019-03-08 ENCOUNTER — Ambulatory Visit (INDEPENDENT_AMBULATORY_CARE_PROVIDER_SITE_OTHER): Payer: Medicare Other | Admitting: *Deleted

## 2019-03-08 DIAGNOSIS — Z0000 Encounter for general adult medical examination without abnormal findings: Secondary | ICD-10-CM

## 2019-03-08 NOTE — Patient Instructions (Signed)
See you next year!  Continue to eat heart healthy diet (full of fruits, vegetables, whole grains, lean protein, water--limit salt, fat, and sugar intake) and increase physical activity as tolerated.  Continue doing brain stimulating activities (puzzles, reading, adult coloring books, staying active) to keep memory sharp.   Bring a copy of your living will and/or healthcare power of attorney to your next office visit.   Danielle Fox , Thank you for taking time to come for your Medicare Wellness Visit. I appreciate your ongoing commitment to your health goals. Please review the following plan we discussed and let me know if I can assist you in the future.   These are the goals we discussed: Goals    . Patient Stated     Maintain healthy active lifestyle.        This is a list of the screening recommended for you and due dates:  Health Maintenance  Topic Date Due  .  Hepatitis C: One time screening is recommended by Center for Disease Control  (CDC) for  adults born from [birth year] through 1965.   May 20, 1950  . Pneumonia vaccines (2 of 2 - PPSV23) 06/29/2017  . Mammogram  01/05/2018  . Tetanus Vaccine  06/30/2026  . Colon Cancer Screening  11/21/2028  . Flu Shot  Completed  . DEXA scan (bone density measurement)  Completed    Preventive Care 65 Years and Older, Female Preventive care refers to lifestyle choices and visits with your health care provider that can promote health and wellness. This includes:  A yearly physical exam. This is also called an annual well check.  Regular dental and eye exams.  Immunizations.  Screening for certain conditions.  Healthy lifestyle choices, such as diet and exercise. What can I expect for my preventive care visit? Physical exam Your health care provider will check:  Height and weight. These may be used to calculate body mass index (BMI), which is a measurement that tells if you are at a healthy weight.  Heart rate and blood  pressure.  Your skin for abnormal spots. Counseling Your health care provider may ask you questions about:  Alcohol, tobacco, and drug use.  Emotional well-being.  Home and relationship well-being.  Sexual activity.  Eating habits.  History of falls.  Memory and ability to understand (cognition).  Work and work Statistician.  Pregnancy and menstrual history. What immunizations do I need?  Influenza (flu) vaccine  This is recommended every year. Tetanus, diphtheria, and pertussis (Tdap) vaccine  You may need a Td booster every 10 years. Varicella (chickenpox) vaccine  You may need this vaccine if you have not already been vaccinated. Zoster (shingles) vaccine  You may need this after age 87. Pneumococcal conjugate (PCV13) vaccine  One dose is recommended after age 86. Pneumococcal polysaccharide (PPSV23) vaccine  One dose is recommended after age 22. Measles, mumps, and rubella (MMR) vaccine  You may need at least one dose of MMR if you were born in 1957 or later. You may also need a second dose. Meningococcal conjugate (MenACWY) vaccine  You may need this if you have certain conditions. Hepatitis A vaccine  You may need this if you have certain conditions or if you travel or work in places where you may be exposed to hepatitis A. Hepatitis B vaccine  You may need this if you have certain conditions or if you travel or work in places where you may be exposed to hepatitis B. Haemophilus influenzae type b (Hib) vaccine  You  may need this if you have certain conditions. You may receive vaccines as individual doses or as more than one vaccine together in one shot (combination vaccines). Talk with your health care provider about the risks and benefits of combination vaccines. What tests do I need? Blood tests  Lipid and cholesterol levels. These may be checked every 5 years, or more frequently depending on your overall health.  Hepatitis C test.  Hepatitis B  test. Screening  Lung cancer screening. You may have this screening every year starting at age 81 if you have a 30-pack-year history of smoking and currently smoke or have quit within the past 15 years.  Colorectal cancer screening. All adults should have this screening starting at age 69 and continuing until age 1. Your health care provider may recommend screening at age 8 if you are at increased risk. You will have tests every 1-10 years, depending on your results and the type of screening test.  Diabetes screening. This is done by checking your blood sugar (glucose) after you have not eaten for a while (fasting). You may have this done every 1-3 years.  Mammogram. This may be done every 1-2 years. Talk with your health care provider about how often you should have regular mammograms.  BRCA-related cancer screening. This may be done if you have a family history of breast, ovarian, tubal, or peritoneal cancers. Other tests  Sexually transmitted disease (STD) testing.  Bone density scan. This is done to screen for osteoporosis. You may have this done starting at age 57. Follow these instructions at home: Eating and drinking  Eat a diet that includes fresh fruits and vegetables, whole grains, lean protein, and low-fat dairy products. Limit your intake of foods with high amounts of sugar, saturated fats, and salt.  Take vitamin and mineral supplements as recommended by your health care provider.  Do not drink alcohol if your health care provider tells you not to drink.  If you drink alcohol: ? Limit how much you have to 0-1 drink a day. ? Be aware of how much alcohol is in your drink. In the [place] one drink equals one 12 oz bottle of beer (355 mL), one 5 oz glass of wine (148 mL), or one 1 oz glass of hard liquor (44 mL). Lifestyle  Take daily care of your teeth and gums.  Stay active. Exercise for at least 30 minutes on 5 or more days each week.  Do not use any products that  contain nicotine or tobacco, such as cigarettes, e-cigarettes, and chewing tobacco. If you need help quitting, ask your health care provider.  If you are sexually active, practice safe sex. Use a condom or other form of protection in order to prevent STIs (sexually transmitted infections).  Talk with your health care provider about taking a low-dose aspirin or statin. What's next?  Go to your health care provider once a year for a well check visit.  Ask your health care provider how often you should have your eyes and teeth checked.  Stay up to date on all vaccines. This information is not intended to replace advice given to you by your health care provider. Make sure you discuss any questions you have with your health care provider. Document Revised: 02/09/2018 Document Reviewed: 02/09/2018 Elsevier Patient Education  2020 [employer].

## 2019-03-13 ENCOUNTER — Telehealth: Payer: Self-pay | Admitting: Physician Assistant

## 2019-03-14 ENCOUNTER — Encounter: Payer: Self-pay | Admitting: Internal Medicine

## 2019-03-14 ENCOUNTER — Ambulatory Visit (INDEPENDENT_AMBULATORY_CARE_PROVIDER_SITE_OTHER): Payer: Medicare Other

## 2019-03-14 ENCOUNTER — Other Ambulatory Visit: Payer: Self-pay

## 2019-03-14 DIAGNOSIS — Z1159 Encounter for screening for other viral diseases: Secondary | ICD-10-CM

## 2019-03-14 DIAGNOSIS — K311 Adult hypertrophic pyloric stenosis: Secondary | ICD-10-CM

## 2019-03-14 DIAGNOSIS — Z01818 Encounter for other preprocedural examination: Secondary | ICD-10-CM

## 2019-03-14 NOTE — Telephone Encounter (Signed)
It sounds like I need to dilate the pylorus again - that is the valve between the stomach and intestine   Please arrange for a direct EGD in [place] I think we should do it Friday so we need to arrange for her Covid test today I guess or at least by tomorrow AM   She should stay on a liquid diet until then - clears and fulls

## 2019-03-14 NOTE — Telephone Encounter (Signed)
Called the patient. No answer. Left message in voicemail.

## 2019-03-14 NOTE — Telephone Encounter (Signed)
She is experiencing the twinges that turn into true pain until she becomes very nauseated and finally vomits. Today she has not eaten, therefore she is not having symptoms. This started about a week ago. Please advise.

## 2019-03-14 NOTE — Telephone Encounter (Signed)
Patient contacted again. Agrees to this plan. She will go to the [place] testing site today at 1:00 pm. [facility] notified. Urgent ambulatory referral entered. Patient will come to the office to sign the consent and receive her written instructions today.

## 2019-03-15 LAB — SARS CORONAVIRUS 2 (TAT 6-24 HRS): SARS Coronavirus 2: NEGATIVE

## 2019-03-16 ENCOUNTER — Encounter: Payer: Self-pay | Admitting: Internal Medicine

## 2019-03-16 ENCOUNTER — Other Ambulatory Visit: Payer: Medicare Other

## 2019-03-16 ENCOUNTER — Other Ambulatory Visit: Payer: Self-pay

## 2019-03-16 ENCOUNTER — Ambulatory Visit (AMBULATORY_SURGERY_CENTER): Payer: Medicare Other | Admitting: Internal Medicine

## 2019-03-16 VITALS — BP 100/58 | HR 67 | Temp 97.7°F | Resp 14 | Ht 61.0 in | Wt 121.0 lb

## 2019-03-16 DIAGNOSIS — K2950 Unspecified chronic gastritis without bleeding: Secondary | ICD-10-CM | POA: Diagnosis not present

## 2019-03-16 DIAGNOSIS — K259 Gastric ulcer, unspecified as acute or chronic, without hemorrhage or perforation: Secondary | ICD-10-CM

## 2019-03-16 DIAGNOSIS — K311 Adult hypertrophic pyloric stenosis: Secondary | ICD-10-CM

## 2019-03-16 DIAGNOSIS — K222 Esophageal obstruction: Secondary | ICD-10-CM | POA: Diagnosis not present

## 2019-03-16 DIAGNOSIS — K3189 Other diseases of stomach and duodenum: Secondary | ICD-10-CM

## 2019-03-16 MED ORDER — SODIUM CHLORIDE 0.9 % IV SOLN: 500.0000 mL | Freq: Once | INTRAVENOUS | Status: DC

## 2019-03-16 MED ORDER — ESOMEPRAZOLE MAGNESIUM 40 MG PO CPDR: 40.0000 mg | DELAYED_RELEASE_CAPSULE | Freq: Every day | ORAL | 3 refills | Status: DC

## 2019-03-16 NOTE — Progress Notes (Signed)
Pt tolerated well. VSS. Awake and to recovery. 

## 2019-03-16 NOTE — Progress Notes (Signed)
Pt's states no medical or surgical changes since previsit or office visit.  Temp LC Vitals KA

## 2019-03-16 NOTE — Op Note (Signed)
[facility] Patient Name: Danielle Fox Procedure Date: 03/16/2019 9:19 AM MRN: [id number] Endoscopist: Gatha Mayer , MD Age: 69 Referring MD:  Date of Birth: [birth year] Gender: Female Account #: [id number] Procedure:                Upper GI endoscopy Indications:              Pyloric stenosis, For therapy of pyloric stenosis Medicines:                Propofol per Anesthesia, Monitored Anesthesia Care Procedure:                Pre-Anesthesia Assessment:                           - Prior to the procedure, a History and Physical                            was performed, and patient medications and                            allergies were reviewed. The patient's tolerance of                            previous anesthesia was also reviewed. The risks                            and benefits of the procedure and the sedation                            options and risks were discussed with the patient.                            All questions were answered, and informed consent                            was obtained. Prior Anticoagulants: The patient has                            taken no previous anticoagulant or antiplatelet                            agents. ASA Grade Assessment: II - A patient with                            mild systemic disease. After reviewing the risks                            and benefits, the patient was deemed in                            satisfactory condition to undergo the procedure.                           After obtaining informed consent, the endoscope was  passed under direct vision. Throughout the                            procedure, the patient's blood pressure, pulse, and                            oxygen saturations were monitored continuously. The                            Endoscope was introduced through the mouth, and                            advanced to the second part of duodenum. The upper                      GI endoscopy was accomplished without difficulty.                            The patient tolerated the procedure well. Scope In: Scope Out: Findings:                 A benign-appearing, intrinsic severe stenosis was                            found at the pylorus. This was traversed. A TTS                            dilator was passed through the scope. Dilation with                            an 8.5-9.5-10.5 mm pyloric balloon dilator was                            performed. A TTS dilator was passed through the                            scope. Dilation with an 01-11-12 mm pyloric balloon                            dilator was performed. The dilation site was                            examined and showed moderate improvement in luminal                            narrowing. Estimated blood loss was minimal.                            Biopsies were taken with a cold forceps for                            histology. Verification of patient identification  for the specimen was done. Estimated blood loss was                            minimal.                           Multiple dispersed small erosions with no stigmata                            of recent bleeding were found in the prepyloric                            region of the stomach. Biopsies were taken with a                            cold forceps for histology. Verification of patient                            identification for the specimen was done. Estimated                            blood loss was minimal.                           The exam was otherwise without abnormality.                           The cardia and gastric fundus were normal on                            retroflexion. Complications:            No immediate complications. Estimated Blood Loss:     Estimated blood loss was minimal. Impression:               - Gastric stenosis was found at the pylorus.                             Dilated. Biopsied.                           - Erosive gastropathy with no stigmata of recent                            bleeding. Biopsied.                           - The examination was otherwise normal. Recommendation:           - Patient has a contact number available for                            emergencies. The signs and symptoms of potential                            delayed complications were discussed with the  patient. Return to normal activities tomorrow.                            Written discharge instructions were provided to the                            patient.                           - Soft diet today.                           - Continue present medications.                           - Use Nexium (esomeprazole) 40 mg PO daily                            indefinitely.                           - Await pathology results.                           - Checking gastrin level today also                           Explained that staying on PPI (she stopped it)                            should prevent recurrence Gatha Mayer, MD 03/16/2019 9:52:31 AM This report has been signed electronically.

## 2019-03-16 NOTE — Patient Instructions (Addendum)
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  I dilated it like before.  I took biopsies of it and the stomach (inflammation).  You need to stay on Nexium to hopefully prevent a recurrence.  If taking ibuprofen, Aleve, Advil, aspirin, Alka Seltzer - these may be causing it so stop .  I am also checking a blood test that might reveal a cause.  Soft diet today and gradually add in foods to try your normal diet.  I appreciate the opportunity to care for you. Gatha Mayer, MD, FACG YOU HAD AN ENDOSCOPIC PROCEDURE TODAY AT [facility]:   Refer to the procedure report that was given to you for any specific questions about what was found during the examination.  If the procedure report does not answer your questions, please call your gastroenterologist to clarify.  If you requested that your care partner not be given the details of your procedure findings, then the procedure report has been included in a sealed envelope for you to review at your convenience later.  YOU SHOULD EXPECT: Some feelings of bloating in the abdomen. Passage of more gas than usual.  Walking can help get rid of the air that was put into your GI tract during the procedure and reduce the bloating. If you had a lower endoscopy (such as a colonoscopy or flexible sigmoidoscopy) you may notice spotting of blood in your stool or on the toilet paper. If you underwent a bowel prep for your procedure, you may not have a normal bowel movement for a few days.  Please Note:  You might notice some irritation and congestion in your nose or some drainage.  This is from the oxygen used during your procedure.  There is no need for concern and it should clear up in a day or so.  SYMPTOMS TO REPORT IMMEDIATELY:   Following lower endoscopy (colonoscopy or flexible  sigmoidoscopy):  Excessive amounts of blood in the stool  Significant tenderness or worsening of abdominal pains  Swelling of the abdomen that is new, acute  Fever of 100F or higher   Following upper endoscopy (EGD)  Vomiting of blood or coffee ground material  New chest pain or pain under the shoulder blades  Painful or persistently difficult swallowing  New shortness of breath  Fever of 100F or higher  Black, tarry-looking stools  For urgent or emergent issues, a gastroenterologist can be reached at any hour by calling [phone].   DIET:  We do recommend a small meal at first, but then you may proceed to your regular diet.  Drink plenty of fluids but you should avoid alcoholic beverages for 24 hours.  ACTIVITY:  You should plan to take it easy for the rest of today and you should NOT DRIVE or use heavy machinery until tomorrow (because of the sedation medicines used during the test).    FOLLOW UP: Our staff will call the number listed on your records 48-72 hours following your procedure to check on you and address any questions or concerns that you may have regarding the information given to you following your procedure. If we do not reach you, we will leave a message.  We will attempt to reach you two times.  During this call, we will ask if you have developed any symptoms of COVID 19. If you develop any  symptoms (ie: fever, flu-like symptoms, shortness of breath, cough etc.) before then, please call [phone].  If you test positive for Covid 19 in the 2 weeks post procedure, please call and report this information to [place].    If any biopsies were taken you will be contacted by phone or by letter within the next 1-3 weeks.  Please call us at [phone] if you have not heard about the biopsies in 3 weeks.    SIGNATURES/CONFIDENTIALITY: You and/or your care partner have signed paperwork which will be entered into your electronic medical record.  These signatures attest to  the fact that that the information above on your After Visit Summary has been reviewed and is understood.  Full responsibility of the confidentiality of this discharge information lies with you and/or your care-partner.

## 2019-03-16 NOTE — Progress Notes (Signed)
Called to room to assist during endoscopic procedure.  Patient ID and intended procedure confirmed with present staff. Received instructions for my participation in the procedure from the performing physician.  

## 2019-03-19 LAB — GASTRIN: Gastrin: 29 pg/mL (ref <=100)

## 2019-03-20 ENCOUNTER — Telehealth: Payer: Self-pay

## 2019-03-20 NOTE — Telephone Encounter (Signed)
Second follow up phone call, no answer LM

## 2019-03-20 NOTE — Telephone Encounter (Signed)
Attempted to reach patient for post-procedure f/u call. No answer. Left message that we will make another attempt to reach her again later today and for her to please not hesitate to call us if she has any questions/concerns regarding her care. 

## 2019-05-06 ENCOUNTER — Ambulatory Visit: Payer: Medicare Other | Attending: Internal Medicine

## 2019-05-06 DIAGNOSIS — Z23 Encounter for immunization: Secondary | ICD-10-CM

## 2019-05-06 NOTE — Progress Notes (Signed)
   Covid-19 Vaccination Clinic  Name:  Danielle Fox    MRN: [id number] DOB: 06-Sep-1950  05/06/2019  Danielle Fox was observed post Covid-19 immunization for 15 minutes without incident. She was provided with Vaccine Information Sheet and instruction to access the V-Safe system.   Danielle Fox was instructed to call 911 with any severe reactions post vaccine: Marland Kitchen Difficulty breathing  . Swelling of face and throat  . A fast heartbeat  . A bad rash all over body  . Dizziness and weakness   Immunizations Administered    Name Date Dose VIS Date Route   Pfizer COVID-19 Vaccine 05/06/2019  9:02 AM 0.3 mL 02/09/2019 Intramuscular   Manufacturer: [place]   Lot: [id number]   [place]: [id number]

## 2019-05-15 ENCOUNTER — Other Ambulatory Visit: Payer: Self-pay

## 2019-05-15 ENCOUNTER — Ambulatory Visit (INDEPENDENT_AMBULATORY_CARE_PROVIDER_SITE_OTHER): Payer: Medicare Other | Admitting: Medical

## 2019-05-15 VITALS — BP 114/60 | HR 62 | Temp 96.0°F | Resp 18 | Ht 61.0 in | Wt 126.6 lb

## 2019-05-15 DIAGNOSIS — R300 Dysuria: Secondary | ICD-10-CM

## 2019-05-15 LAB — POC URINALSYSI DIPSTICK (AUTOMATED)
Bilirubin, UA: NEGATIVE
Blood, UA: NEGATIVE
Glucose, UA: NEGATIVE
Ketones, UA: NEGATIVE
Nitrite, UA: NEGATIVE
Protein, UA: NEGATIVE
Spec Grav, UA: 1.015 (ref 1.010–1.025)
Urobilinogen, UA: 0.2 E.U./dL
pH, UA: 6 (ref 5.0–8.0)

## 2019-05-15 MED ORDER — CIPROFLOXACIN HCL 250 MG PO TABS: 250.0000 mg | ORAL_TABLET | Freq: Two times a day (BID) | ORAL | 0 refills | Status: DC

## 2019-05-15 NOTE — Addendum Note (Signed)
Addended by: Jeronimo Greaves on: 05/15/2019 11:23 AM   Modules accepted: Orders

## 2019-05-15 NOTE — Progress Notes (Signed)
Subjective:    Patient ID: Danielle Fox, female    DOB: 01/07/1951, 69 y.o.   MRN: [id number]  HPI Pt in today reporting urinary symptoms x 1 wk.  Dysuria-yes  Frequent urination-not really. Hesitancy-yes. Suprapubic pressure-yes Fever-no chills-no Nausea-no Vomiting-no CVA pain-no History of UTI- no chronic history of uti. Gross hematuria-no  No odor to urine.   Review of Systems  Constitutional: Negative for chills, fatigue and fever.  Respiratory: Negative for cough, chest tightness, shortness of breath and wheezing.   Cardiovascular: Negative for chest pain and palpitations.  Gastrointestinal: Negative for abdominal pain.  Genitourinary: Positive for dysuria. Negative for frequency, urgency, vaginal bleeding and vaginal pain.  Musculoskeletal: Negative for back pain and myalgias.  Skin: Negative for rash.  Neurological: Negative for dizziness, light-headedness and numbness.  Hematological: Negative for adenopathy. Does not bruise/bleed easily.  Psychiatric/Behavioral: Negative for behavioral problems, decreased concentration, sleep disturbance and suicidal ideas. The patient is not nervous/anxious.     Past Medical History:  Diagnosis Date  . Allergy   . Clostridioides difficile infection 10/19/2018   tested 10/2018 negative  . Colon polyps   . Constipation 10/06/2014  . COPD (chronic obstructive pulmonary disease) ([place])   . Osteopenia   . 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 Preventative health care 06/29/2016  . Pruritus 06/29/2016  . Welcome to Medicare preventive visit 06/29/2016     Social History   Socioeconomic History  . Marital status: Significant Other    Spouse name: Not on file  . Number of children: 3  . Years of education: Not on file  . Highest education level: Not on file  Occupational History  . 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 Smoking status: Former Smoker    Packs/day: 1.00    Years:  48.00    Pack years: 48.00    Types: Cigarettes    Quit date: 02/19/2014    Years since quitting: 5.2  . Smokeless tobacco: Never Used  . Tobacco comment: Quit and has no desire to smoke again,  Substance and Sexual Activity  . Alcohol use: Yes    Alcohol/week: 0.0 standard drinks    Comment: socially  . Drug use: No  . Sexual activity: Yes    Partners: Male  Other Topics Concern  . Not on file  Social History Narrative   Exercise-- walks dog on occassion   Social Determinants of Health   Financial Resource Strain:   . Difficulty of Paying Living Expenses:   Food Insecurity:   . Worried About Charity fundraiser in the Last Year:   . Arboriculturist in the Last Year:   Transportation Needs:   . Film/video editor (Medical):   Marland Kitchen Lack of Transportation (Non-Medical):   Physical Activity:   . Days of Exercise per Week:   . Minutes of Exercise per Session:   Stress:   . Feeling of Stress :   Social Connections:   . Frequency of Communication with Friends and Family:   . Frequency of Social Gatherings with Friends and Family:   . Attends Religious Services:   . Active Member of Clubs or Organizations:   . Attends Archivist Meetings:   Marland Kitchen Marital Status:   Intimate Partner Violence:   . Fear of Current or Ex-Partner:   . Emotionally Abused:   Marland Kitchen Physically Abused:   . Sexually Abused:  Past Surgical History:  Procedure Laterality Date  . APPENDECTOMY    . COLONOSCOPY     01-14-2005  . SINUS IRRIGATION  04/02/2015  . UPPER GASTROINTESTINAL ENDOSCOPY  11/22/2018    Family History  Problem Relation Age of Onset  . Heart disease Mother        cabg, MI  . Alcohol abuse Father   . Cirrhosis Father   . Heart disease Father        MI  . Colon cancer Neg Hx   . Colon polyps Neg Hx   . Esophageal cancer Neg Hx   . Stomach cancer Neg Hx   . Rectal cancer Neg Hx     Allergies  Allergen Reactions  . Doxycycline Nausea And Vomiting  .  Erythromycin Base     Other reaction(s): GI Upset (intolerance)    Current Outpatient Medications on File Prior to Visit  Medication Sig Dispense Refill  . budesonide-formoterol (SYMBICORT) 80-4.5 MCG/ACT inhaler USE 2 PUFFS 2 TIMES DAILY 30.6 Inhaler 1  . esomeprazole (NEXIUM) 40 MG capsule Take 1 capsule (40 mg total) by mouth daily before breakfast. 90 capsule 3  . albuterol (PROVENTIL HFA;VENTOLIN HFA) 108 (90 Base) MCG/ACT inhaler Inhale 2 puffs into the lungs every 6 (six) hours as needed for wheezing or shortness of breath. (Patient not taking: Reported on 03/08/2019) 1 Inhaler 2   No current facility-administered medications on file prior to visit.    BP 114/60 (BP Location: Left Arm, Patient Position: Standing, Cuff Size: Normal)   Pulse 62   Temp (!) 96 F (35.6 C) (Temporal)   Resp 18   Ht 5\' 1"  (1.549 m)   Wt 126 lb 9.6 oz (57.4 kg)   SpO2 98%   BMI 23.92 kg/m       Objective:   Physical Exam  General- No acute distress. Pleasant patient. Neck- Full range of motion, no jvd Lungs- Clear, even and unlabored. Heart- regular rate and rhythm. Neurologic- CNII- XII grossly intact. Back- no cva tenderness.  Abdomen- soft, +bs, no rebound or guarding. Minimal suprapubic tenderness.       Assessment & Plan:  You appear to have a urinary tract infection. I am prescribing  cipro antibiotic for the probable infection. Hydrate well. I am sending out a urine culture. During the interim if your signs and symptoms worsen rather than improving please notify us. We will notify your when the culture results are back.  Follow up in 7 days or as needed.  20 minutes spent with pt today.  Mackie Pai, PA-C

## 2019-05-15 NOTE — Patient Instructions (Signed)
You appear to have a urinary tract infection. I am prescribing  cipro antibiotic for the probable infection. Hydrate well. I am sending out a urine culture. During the interim if your signs and symptoms worsen rather than improving please notify us. We will notify your when the culture results are back.  Follow up in 7 days or as needed.

## 2019-05-17 ENCOUNTER — Telehealth: Payer: Self-pay | Admitting: Medical

## 2019-05-17 LAB — URINE CULTURE
MICRO NUMBER:: 10256591
SPECIMEN QUALITY:: ADEQUATE

## 2019-05-17 MED ORDER — CIPROFLOXACIN HCL 500 MG PO TABS: 500.0000 mg | ORAL_TABLET | Freq: Two times a day (BID) | ORAL | 0 refills | Status: DC

## 2019-05-17 NOTE — Telephone Encounter (Signed)
Additional 4 days antibiotic given after culture review.

## 2019-05-31 DIAGNOSIS — K311 Adult hypertrophic pyloric stenosis: Secondary | ICD-10-CM

## 2019-05-31 HISTORY — DX: Adult hypertrophic pyloric stenosis: K31.1

## 2019-06-06 ENCOUNTER — Ambulatory Visit: Payer: Medicare Other | Attending: Internal Medicine

## 2019-06-06 DIAGNOSIS — Z23 Encounter for immunization: Secondary | ICD-10-CM

## 2019-06-06 NOTE — Progress Notes (Signed)
   Covid-19 Vaccination Clinic  Name:  Danielle Fox    MRN: [id number] DOB: 1950/10/08  06/06/2019  Danielle Fox was observed post Covid-19 immunization for 15 minutes without incident. She was provided with Vaccine Information Sheet and instruction to access the V-Safe system.   Danielle Fox was instructed to call 911 with any severe reactions post vaccine: Marland Kitchen Difficulty breathing  . Swelling of face and throat  . A fast heartbeat  . A bad rash all over body  . Dizziness and weakness   Immunizations Administered    Name Date Dose VIS Date Route   Pfizer COVID-19 Vaccine 06/06/2019  4:43 PM 0.3 mL 02/09/2019 Intramuscular   Manufacturer: [place]   Lot: [id number]   [place]: [id number]

## 2019-06-18 ENCOUNTER — Encounter (HOSPITAL_COMMUNITY): Payer: Self-pay | Admitting: Emergency Medicine

## 2019-06-18 ENCOUNTER — Emergency Department (HOSPITAL_COMMUNITY)
Admission: EM | Admit: 2019-06-18 | Discharge: 2019-06-18 | Disposition: A | Discharge status: Home / Self Care | Payer: Medicare Other | Attending: Emergency Medicine | Admitting: Emergency Medicine

## 2019-06-18 ENCOUNTER — Other Ambulatory Visit: Payer: Self-pay

## 2019-06-18 DIAGNOSIS — Z743 Need for continuous supervision: Secondary | ICD-10-CM | POA: Diagnosis not present

## 2019-06-18 DIAGNOSIS — Z79899 Other long term (current) drug therapy: Secondary | ICD-10-CM | POA: Diagnosis not present

## 2019-06-18 DIAGNOSIS — J449 Chronic obstructive pulmonary disease, unspecified: Secondary | ICD-10-CM | POA: Diagnosis not present

## 2019-06-18 DIAGNOSIS — R55 Syncope and collapse: Secondary | ICD-10-CM | POA: Diagnosis not present

## 2019-06-18 DIAGNOSIS — Z87891 Personal history of nicotine dependence: Secondary | ICD-10-CM | POA: Diagnosis not present

## 2019-06-18 DIAGNOSIS — I959 Hypotension, unspecified: Secondary | ICD-10-CM | POA: Diagnosis not present

## 2019-06-18 DIAGNOSIS — R110 Nausea: Secondary | ICD-10-CM | POA: Diagnosis not present

## 2019-06-18 LAB — CBC
HCT: 38.3 % (ref 36.0–46.0)
Hemoglobin: 12 g/dL (ref 12.0–15.0)
MCH: 28.6 pg (ref 26.0–34.0)
MCHC: 31.3 g/dL (ref 30.0–36.0)
MCV: 91.4 fL (ref 80.0–100.0)
Platelets: 282 10*3/uL (ref 150–400)
RBC: 4.19 MIL/uL (ref 3.87–5.11)
RDW: 13.2 % (ref 11.5–15.5)
WBC: 12.3 10*3/uL — ABNORMAL HIGH (ref 4.0–10.5)
nRBC: 0 % (ref 0.0–0.2)

## 2019-06-18 LAB — BASIC METABOLIC PANEL
Anion gap: 9 (ref 5–15)
BUN: 15 mg/dL (ref 8–23)
CO2: 22 mmol/L (ref 22–32)
Calcium: 8.8 mg/dL — ABNORMAL LOW (ref 8.9–10.3)
Chloride: 108 mmol/L (ref 98–111)
Creatinine, Ser: 0.94 mg/dL (ref 0.44–1.00)
GFR calc Af Amer: >60 mL/min (ref >60)
GFR calc non Af Amer: >60 mL/min (ref >60)
Glucose, Bld: 128 mg/dL — ABNORMAL HIGH (ref 70–99)
Potassium: 4.1 mmol/L (ref 3.5–5.1)
Sodium: 139 mmol/L (ref 135–145)

## 2019-06-18 MED ORDER — LACTATED RINGERS IV BOLUS
1000.0000 mL | Freq: Once | INTRAVENOUS | Status: AC
  Administered 2019-06-18: 1000 mL via INTRAVENOUS

## 2019-06-18 NOTE — ED Triage Notes (Signed)
Pt BIB [facility], reports pt had a syncopal episode after giving blood today. Initially hypotensive at 82/50, improved to 96/50 after 450ccNS. C/o fatigue and nausea, given 4mg  zofran pta.

## 2019-06-18 NOTE — ED Provider Notes (Signed)
[place] [facility] EMERGENCY DEPARTMENT Provider Note   CSN: [id number] Arrival date & time: 06/18/19  1534     History Chief Complaint  Patient presents with  . Loss of Consciousness    Danielle Fox is a 69 y.o. female.  69yo F w/ PMH below who p/w syncope. Just PTA, pt was routinely donating blood. She finished donation and was sitting waiting during the observation period when she began feeling lightheaded and her vision and hearing began closing in.  Witnesses state that she syncopized and she was lowered to the ground, no fall or head injury.  She was initially hypotensive at 82/50, improved to 96/50 after 42ml fluid bolus by EMS. BG ok per EMS.  Was given Zofran by EMS for nausea, nausea resolved.  She has some mild fatigue and generalized weakness but otherwise denies any complaints.  No chest pain or shortness of breath.  She reports she was in her usual state of health this morning, has had no recent illness and has been eating and drinking normally.  She denies any problems with syncope or heart problems.  She reports that her hemoglobin was 14 when they checked it before donation. No melena or hematochezia.  No anticoagulant use.  The history is provided by the patient.  Loss of Consciousness      Past Medical History:  Diagnosis Date  . Allergy   . Clostridioides difficile infection 10/19/2018   tested 10/2018 negative  . Colon polyps   . Constipation 10/06/2014  . COPD (chronic obstructive pulmonary disease) ([place])   . Osteopenia   . 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 Preventative health care 06/29/2016  . Pruritus 06/29/2016  . Welcome to Medicare preventive visit 06/29/2016    Patient Active Problem List   Diagnosis Date Noted  . Clostridioides difficile infection   . Diarrhea 09/24/2018  . Vitamin D deficiency 01/02/2017  . Welcome to Medicare preventive visit 06/29/2016  . Pruritus 06/29/2016  . Abnormal CT scan 08/14/2015  . Constipation  10/06/2014  . 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 Rib pain on left side 09/08/2014  . Chronic cough 06/14/2014  . SOB (shortness of breath) 06/14/2014  . Cough 05/20/2014  . Hyperlipidemia, mild 08/15/2011  . Osteopenia 03/01/2011  . History of smoking 03/01/2011  . Musculoskeletal pain 03/01/2011    Past Surgical History:  Procedure Laterality Date  . APPENDECTOMY    . COLONOSCOPY     01-14-2005  . SINUS IRRIGATION  04/02/2015  . UPPER GASTROINTESTINAL ENDOSCOPY  11/22/2018     OB History   No obstetric history on file.     Family History  Problem Relation Age of Onset  . Heart disease Mother        cabg, MI  . Alcohol abuse Father   . Cirrhosis Father   . Heart disease Father        MI  . Colon cancer Neg Hx   . Colon polyps Neg Hx   . Esophageal cancer Neg Hx   . Stomach cancer Neg Hx   . Rectal cancer Neg Hx     Social History   Tobacco Use  . Smoking status: Former Smoker    Packs/day: 1.00    Years: 48.00    Pack years: 48.00    Types: Cigarettes    Quit date: 02/19/2014    Years since quitting: 5.3  . Smokeless tobacco: Never Used  . Tobacco comment: Quit and has no  desire to smoke again,  Substance Use Topics  . Alcohol use: Yes    Alcohol/week: 0.0 standard drinks    Comment: socially  . Drug use: No    Home Medications Prior to Admission medications   Medication Sig Start Date End Date Taking? Authorizing Provider  albuterol (PROVENTIL HFA;VENTOLIN HFA) 108 (90 Base) MCG/ACT inhaler Inhale 2 puffs into the lungs every 6 (six) hours as needed for wheezing or shortness of breath. Patient not taking: Reported on 03/08/2019 06/14/17   Mosie Lukes, MD  budesonide-formoterol [facility]) 80-4.5 MCG/ACT inhaler USE 2 PUFFS 2 TIMES DAILY 11/17/18   Mosie Lukes, MD  ciprofloxacin (CIPRO) 250 MG tablet Take 1 tablet (250 mg total) by mouth 2 (two) times daily. 05/15/19   Saguier, Percell Miller, PA-C  ciprofloxacin (CIPRO) 500 MG tablet Take 1 tablet (500 mg total)  by mouth 2 (two) times daily. 05/17/19   Saguier, Percell Miller, PA-C  esomeprazole (NEXIUM) 40 MG capsule Take 1 capsule (40 mg total) by mouth daily before breakfast. 03/16/19   Gatha Mayer, MD    Allergies    Doxycycline and Erythromycin base  Review of Systems   Review of Systems  Cardiovascular: Positive for syncope.   All other systems reviewed and are negative except that which was mentioned in HPI  Physical Exam Updated Vital Signs BP (!) 111/39   Pulse 76   Temp 97.8 F (36.6 C) (Oral)   Resp 19   SpO2 100%   Physical Exam Vitals and nursing note reviewed.  Constitutional:      General: She is not in acute distress.    Appearance: Normal appearance. She is well-developed.  HENT:     Head: Normocephalic and atraumatic.     Mouth/Throat:     Mouth: Mucous membranes are moist.     Pharynx: Oropharynx is clear.  Eyes:     Conjunctiva/sclera: Conjunctivae normal.  Cardiovascular:     Rate and Rhythm: Normal rate and regular rhythm.     Heart sounds: Normal heart sounds. No murmur.  Pulmonary:     Effort: Pulmonary effort is normal.     Breath sounds: Normal breath sounds.  Abdominal:     General: Bowel sounds are normal. There is no distension.     Palpations: Abdomen is soft.     Tenderness: There is no abdominal tenderness.  Musculoskeletal:     Cervical back: Neck supple.  Skin:    General: Skin is warm and dry.  Neurological:     Mental Status: She is alert and oriented to person, place, and time.     Comments: Fluent speech  Psychiatric:        Judgment: Judgment normal.     ED Results / Procedures / Treatments   Labs (all labs ordered are listed, but only abnormal results are displayed) Labs Reviewed  BASIC METABOLIC PANEL - Abnormal; Notable for the following components:      Result Value   Glucose, Bld 128 (*)    Calcium 8.8 (*)    All other components within normal limits  CBC - Abnormal; Notable for the following components:   WBC 12.3 (*)     All other components within normal limits    EKG EKG Interpretation  Date/Time:  Monday June 18 2019 17:03:40 EDT Ventricular Rate:  80 PR Interval:    QRS Duration: 94 QT Interval:  402 QTC Calculation: 464 R Axis:   61 Text Interpretation: Sinus rhythm Low voltage, precordial leads No previous  ECGs available Confirmed by Theotis Burrow [phone]) on 06/18/2019 5:21:52 PM   Radiology No results found.  Procedures Procedures (including critical care time)  Medications Ordered in ED Medications  lactated ringers bolus 1,000 mL (0 mLs Intravenous Stopped 06/18/19 1658)    ED Course  I have reviewed the triage vital signs and the nursing notes.  Pertinent labs  that were available during my care of the patient were reviewed by me and considered in my medical decision making (see chart for details).    MDM Rules/Calculators/A&P                      Well appearing and comfortable on exam. Gave fluid bolus. LAbs show reassuring Hgb and normal Creatinine. Pt ambulatory and feeling well on reassessment w/ normal BP. I suspect syncope is directly related to blood donation.  Discussed supportive measures including continued hydration at home.  Reviewed return precautions. Final Clinical Impression(s) / ED Diagnoses Final diagnoses:  Vasovagal syncope    Rx / DC Orders ED Discharge Orders    None       , Wenda Overland, MD 06/18/19 2327

## 2019-07-07 ENCOUNTER — Encounter: Payer: Self-pay | Admitting: Family Medicine

## 2019-07-09 MED ORDER — BUDESONIDE-FORMOTEROL FUMARATE 80-4.5 MCG/ACT IN AERO: 2.0000 | INHALATION_SPRAY | Freq: Two times a day (BID) | RESPIRATORY_TRACT | 1 refills | Status: DC

## 2019-08-27 ENCOUNTER — Other Ambulatory Visit: Payer: Self-pay

## 2019-08-27 ENCOUNTER — Ambulatory Visit (INDEPENDENT_AMBULATORY_CARE_PROVIDER_SITE_OTHER): Payer: Medicare Other | Admitting: Family

## 2019-08-27 ENCOUNTER — Encounter: Payer: Self-pay | Admitting: Family

## 2019-08-27 VITALS — BP 116/60 | HR 80 | Temp 98.3°F | Resp 16 | Ht 61.0 in | Wt 117.0 lb

## 2019-08-27 DIAGNOSIS — L299 Pruritus, unspecified: Secondary | ICD-10-CM

## 2019-08-27 MED ORDER — PREDNISONE 10 MG PO TABS: ORAL_TABLET | ORAL | 0 refills | Status: DC

## 2019-08-27 MED ORDER — CETIRIZINE HCL 10 MG PO TABS: 10.0000 mg | ORAL_TABLET | Freq: Every day | ORAL | 11 refills | Status: DC

## 2019-08-27 NOTE — Patient Instructions (Signed)
Add zyrtec 10mg  once daily for itching. If you have severe breakthrough itching, you may use benadryl as needed at night. Make sure all of your detergents and fabric softeners are "free and clear" with no fragrances or dyes. Use gentle skin care products for sensitive skin such as aveeno, cetaphil, or eucerin. Start prednisone taper. Call if symptoms worsen or if not improved in 1-2 weeks.

## 2019-08-27 NOTE — Progress Notes (Signed)
Subjective:    Patient ID: Danielle Fox, female    DOB: May 28, 1950, 69 y.o.   MRN: [id number]  HPI  Patient is a 69 yr old female who presents today with chief complaint of pruritis.  Reports that itching is most notable on chest, abdomen and ears. Reports that it started 18 months ago.  Reports that she developed itching 10 days ago.  Denies hives. She has tried calamine, uses benadryl 2 tabs once daily. Helps some.     Review of Systems    see HPI  Past Medical History:  Diagnosis Date  . Allergy   . Clostridioides difficile infection 10/19/2018   tested 10/2018 negative  . Colon polyps   . Constipation 10/06/2014  . COPD (chronic obstructive pulmonary disease) ([place])   . Osteopenia   . 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 Preventative health care 06/29/2016  . Pruritus 06/29/2016  . Welcome to Medicare preventive visit 06/29/2016     Social History   Socioeconomic History  . Marital status: Significant Other    Spouse name: Not on file  . Number of children: 3  . Years of education: Not on file  . Highest education level: Not on file  Occupational History  . 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 Smoking status: Former Smoker    Packs/day: 1.00    Years: 48.00    Pack years: 48.00    Types: Cigarettes    Quit date: 02/19/2014    Years since quitting: 5.5  . Smokeless tobacco: Never Used  . Tobacco comment: Quit and has no desire to smoke again,  Vaping Use  . Vaping Use: Never used  Substance and Sexual Activity  . Alcohol use: Yes    Alcohol/week: 0.0 standard drinks    Comment: socially  . Drug use: No  . Sexual activity: Yes    Partners: Male  Other Topics Concern  . Not on file  Social History Narrative   Exercise-- walks dog on occassion   Social Determinants of Health   Financial Resource Strain:   . Difficulty of Paying Living Expenses:   Food Insecurity:   . Worried About Charity fundraiser in the Last  Year:   . Arboriculturist in the Last Year:   Transportation Needs:   . Film/video editor (Medical):   Marland Kitchen Lack of Transportation (Non-Medical):   Physical Activity:   . Days of Exercise per Week:   . Minutes of Exercise per Session:   Stress:   . Feeling of Stress :   Social Connections:   . Frequency of Communication with Friends and Family:   . Frequency of Social Gatherings with Friends and Family:   . Attends Religious Services:   . Active Member of Clubs or Organizations:   . Attends Archivist Meetings:   Marland Kitchen Marital Status:   Intimate Partner Violence:   . Fear of Current or Ex-Partner:   . Emotionally Abused:   Marland Kitchen Physically Abused:   . Sexually Abused:     Past Surgical History:  Procedure Laterality Date  . APPENDECTOMY    . COLONOSCOPY     01-14-2005  . SINUS IRRIGATION  04/02/2015  . UPPER GASTROINTESTINAL ENDOSCOPY  11/22/2018    Family History  Problem Relation Age of Onset  . Heart disease Mother        cabg, MI  . Alcohol abuse Father   .  Cirrhosis Father   . Heart disease Father        MI  . Colon cancer Neg Hx   . Colon polyps Neg Hx   . Esophageal cancer Neg Hx   . Stomach cancer Neg Hx   . Rectal cancer Neg Hx     Allergies  Allergen Reactions  . Doxycycline Nausea And Vomiting  . Erythromycin Base     Other reaction(s): GI Upset (intolerance)    Current Outpatient Medications on File Prior to Visit  Medication Sig Dispense Refill  . albuterol (PROVENTIL HFA;VENTOLIN HFA) 108 (90 Base) MCG/ACT inhaler Inhale 2 puffs into the lungs every 6 (six) hours as needed for wheezing or shortness of breath. 1 Inhaler 2  . budesonide-formoterol (SYMBICORT) 80-4.5 MCG/ACT inhaler Inhale 2 puffs into the lungs in the morning and at bedtime. 30.6 g 1  . esomeprazole (NEXIUM) 40 MG capsule Take 1 capsule (40 mg total) by mouth daily before breakfast. 90 capsule 3   No current facility-administered medications on file prior to visit.    BP  116/60 (BP Location: Right Arm, Patient Position: Sitting, Cuff Size: Small)   Pulse 80   Temp 98.3 F (36.8 C) (Temporal)   Resp 16   Ht 5\' 1"  (1.549 m)   Wt 117 lb (53.1 kg)   SpO2 99%   BMI 22.11 kg/m    Objective:   Physical Exam Constitutional:      Appearance: She is well-developed.  Neck:     Thyroid: No thyromegaly.  Cardiovascular:     Rate and Rhythm: Normal rate and regular rhythm.     Heart sounds: Normal heart sounds. No murmur heard.   Pulmonary:     Effort: Pulmonary effort is normal. No respiratory distress.     Breath sounds: Normal breath sounds. No wheezing.  Musculoskeletal:     Cervical back: Neck supple.  Skin:    General: Skin is warm and dry.     Comments: Some irritation from excoriation on the anterior chest.  No hives or rash noted  Neurological:     Mental Status: She is alert and oriented to person, place, and time.  Psychiatric:        Behavior: Behavior normal.        Thought Content: Thought content normal.        Judgment: Judgment normal.           Assessment & Plan:  Pruritus-patient is advised as follows:  Add zyrtec 10mg  once daily for itching. If you have severe breakthrough itching, you may use benadryl as needed at night. Make sure all of your detergents and fabric softeners are "free and clear" with no fragrances or dyes. Use gentle skin care products for sensitive skin such as aveeno, cetaphil, or eucerin. Start prednisone taper. Call if symptoms worsen or if not improved in 1-2 weeks.    This visit occurred during the SARS-CoV-2 public health emergency.  Safety protocols were in place, including screening questions prior to the visit, additional usage of staff PPE, and extensive cleaning of exam room while observing appropriate contact time as indicated for disinfecting solutions.

## 2019-09-15 DIAGNOSIS — R278 Other lack of coordination: Secondary | ICD-10-CM | POA: Diagnosis not present

## 2019-09-15 DIAGNOSIS — R279 Unspecified lack of coordination: Secondary | ICD-10-CM | POA: Diagnosis not present

## 2019-09-15 DIAGNOSIS — E876 Hypokalemia: Secondary | ICD-10-CM | POA: Diagnosis not present

## 2019-09-15 DIAGNOSIS — R29898 Other symptoms and signs involving the musculoskeletal system: Secondary | ICD-10-CM | POA: Diagnosis not present

## 2019-09-15 DIAGNOSIS — Z87891 Personal history of nicotine dependence: Secondary | ICD-10-CM | POA: Diagnosis not present

## 2019-09-15 DIAGNOSIS — R29818 Other symptoms and signs involving the nervous system: Secondary | ICD-10-CM | POA: Diagnosis not present

## 2019-09-15 DIAGNOSIS — Z7982 Long term (current) use of aspirin: Secondary | ICD-10-CM | POA: Diagnosis not present

## 2019-09-16 DIAGNOSIS — E876 Hypokalemia: Secondary | ICD-10-CM | POA: Diagnosis not present

## 2019-09-16 DIAGNOSIS — R531 Weakness: Secondary | ICD-10-CM | POA: Diagnosis not present

## 2019-09-16 DIAGNOSIS — I639 Cerebral infarction, unspecified: Secondary | ICD-10-CM | POA: Diagnosis not present

## 2019-09-16 DIAGNOSIS — R279 Unspecified lack of coordination: Secondary | ICD-10-CM | POA: Diagnosis not present

## 2019-09-16 DIAGNOSIS — R29898 Other symptoms and signs involving the musculoskeletal system: Secondary | ICD-10-CM | POA: Diagnosis not present

## 2019-09-16 DIAGNOSIS — I771 Stricture of artery: Secondary | ICD-10-CM | POA: Diagnosis not present

## 2019-09-17 ENCOUNTER — Encounter (HOSPITAL_COMMUNITY): Payer: Self-pay | Admitting: Emergency Medicine

## 2019-09-17 ENCOUNTER — Emergency Department (HOSPITAL_COMMUNITY): Payer: Medicare Other

## 2019-09-17 ENCOUNTER — Emergency Department (HOSPITAL_COMMUNITY)
Admission: EM | Admit: 2019-09-17 | Discharge: 2019-09-18 | Disposition: A | Discharge status: Home / Self Care | Payer: Medicare Other | Attending: Emergency Medicine | Admitting: Emergency Medicine

## 2019-09-17 ENCOUNTER — Other Ambulatory Visit: Payer: Self-pay

## 2019-09-17 DIAGNOSIS — Z5321 Procedure and treatment not carried out due to patient leaving prior to being seen by health care provider: Secondary | ICD-10-CM | POA: Insufficient documentation

## 2019-09-17 DIAGNOSIS — R202 Paresthesia of skin: Secondary | ICD-10-CM | POA: Diagnosis not present

## 2019-09-17 DIAGNOSIS — I639 Cerebral infarction, unspecified: Secondary | ICD-10-CM | POA: Diagnosis not present

## 2019-09-17 LAB — TROPONIN I (HIGH SENSITIVITY)
Troponin I (High Sensitivity): 3 ng/L (ref <18)
Troponin I (High Sensitivity): 3 ng/L (ref <18)

## 2019-09-17 LAB — COMPREHENSIVE METABOLIC PANEL
ALT: 14 U/L (ref 0–44)
AST: 17 U/L (ref 15–41)
Albumin: 3.3 g/dL — ABNORMAL LOW (ref 3.5–5.0)
Alkaline Phosphatase: 80 U/L (ref 38–126)
Anion gap: 8 (ref 5–15)
BUN: 6 mg/dL — ABNORMAL LOW (ref 8–23)
CO2: 24 mmol/L (ref 22–32)
Calcium: 8.9 mg/dL (ref 8.9–10.3)
Chloride: 101 mmol/L (ref 98–111)
Creatinine, Ser: 0.89 mg/dL (ref 0.44–1.00)
GFR calc Af Amer: >60 mL/min (ref >60)
GFR calc non Af Amer: >60 mL/min (ref >60)
Glucose, Bld: 111 mg/dL — ABNORMAL HIGH (ref 70–99)
Potassium: 3.3 mmol/L — ABNORMAL LOW (ref 3.5–5.1)
Sodium: 133 mmol/L — ABNORMAL LOW (ref 135–145)
Total Bilirubin: 0.5 mg/dL (ref 0.3–1.2)
Total Protein: 7 g/dL (ref 6.5–8.1)

## 2019-09-17 LAB — CBC WITH DIFFERENTIAL/PLATELET
Abs Immature Granulocytes: 0.02 10*3/uL (ref 0.00–0.07)
Basophils Absolute: 0 10*3/uL (ref 0.0–0.1)
Basophils Relative: 0 %
Eosinophils Absolute: 0.1 10*3/uL (ref 0.0–0.5)
Eosinophils Relative: 2 %
HCT: 35.3 % — ABNORMAL LOW (ref 36.0–46.0)
Hemoglobin: 11.3 g/dL — ABNORMAL LOW (ref 12.0–15.0)
Immature Granulocytes: 0 %
Lymphocytes Relative: 20 %
Lymphs Abs: 1.1 10*3/uL (ref 0.7–4.0)
MCH: 26.9 pg (ref 26.0–34.0)
MCHC: 32 g/dL (ref 30.0–36.0)
MCV: 84 fL (ref 80.0–100.0)
Monocytes Absolute: 0.8 10*3/uL (ref 0.1–1.0)
Monocytes Relative: 14 %
Neutro Abs: 3.5 10*3/uL (ref 1.7–7.7)
Neutrophils Relative %: 64 %
Platelets: 393 10*3/uL (ref 150–400)
RBC: 4.2 MIL/uL (ref 3.87–5.11)
RDW: 13.8 % (ref 11.5–15.5)
WBC: 5.6 10*3/uL (ref 4.0–10.5)
nRBC: 0 % (ref 0.0–0.2)

## 2019-09-17 NOTE — ED Notes (Signed)
Pt asked for her update position in l;ike. Pt notified that she had beedn herre for 5 hour and the longest wait was a little under 9 hours. Pt stated "I'm out. I'm going home" Pt informed that is may not be 9 hours, but there is no accurate way to know exactly.Pt refused to leave.

## 2019-09-17 NOTE — ED Triage Notes (Signed)
Pt states she was diagnose yesterday with a stroke, today she started having some left arm numbness at 17:00. Pt AO x 4 on triage, no neuro deficit noticed during assessment, no pain. Pt states she doesn't have the symptoms now. VAN negative on triage.

## 2019-09-18 ENCOUNTER — Ambulatory Visit (INDEPENDENT_AMBULATORY_CARE_PROVIDER_SITE_OTHER): Payer: Medicare Other | Admitting: Family Medicine

## 2019-09-18 ENCOUNTER — Telehealth: Payer: Self-pay

## 2019-09-18 ENCOUNTER — Encounter: Payer: Self-pay | Admitting: Family Medicine

## 2019-09-18 VITALS — BP 100/60 | HR 88 | Temp 98.5°F | Resp 12 | Ht 61.0 in | Wt 117.4 lb

## 2019-09-18 DIAGNOSIS — Z8673 Personal history of transient ischemic attack (TIA), and cerebral infarction without residual deficits: Secondary | ICD-10-CM | POA: Diagnosis not present

## 2019-09-18 DIAGNOSIS — R6883 Chills (without fever): Secondary | ICD-10-CM | POA: Diagnosis not present

## 2019-09-18 DIAGNOSIS — E559 Vitamin D deficiency, unspecified: Secondary | ICD-10-CM | POA: Diagnosis not present

## 2019-09-18 DIAGNOSIS — E876 Hypokalemia: Secondary | ICD-10-CM | POA: Diagnosis not present

## 2019-09-18 DIAGNOSIS — I639 Cerebral infarction, unspecified: Secondary | ICD-10-CM

## 2019-09-18 MED ORDER — ASPIRIN 81 MG PO TBEC: 81.0000 mg | DELAYED_RELEASE_TABLET | Freq: Two times a day (BID) | ORAL | Status: DC

## 2019-09-18 NOTE — Telephone Encounter (Signed)
Nurse Assessment Nurse: Danielle Spore, RN, [place] Date/Time (Eastern Time): 09/17/2019 8:12:34 PM Confirm and document reason for call. If symptomatic, describe symptoms. ---caller states at ER at current time r/t numbness and weakness on one side of body. went to ER last night r/t same s/s and DX CVA. told to take 81 mg aspirin. caller states she is very worried as ER MD has not evaluated her. been waiting for approx 3 hours. no triage. caller would like to speak to on call MD direct Has the patient had close contact with a person known or suspected to have the novel coronavirus illness OR traveled / lives in area with major community spread (including international travel) in the last 14 days from the onset of symptoms? * If Asymptomatic, screen for exposure and travel within the last 14 days. ---No Does the patient have any new or worsening symptoms? ---Yes Will a triage be completed? ---No Select reason for no triage. ---Patient declined Please document clinical information provided and list any resource used. ---caller states would like to speak to on call MD Disp. Time Eilene Ghazi Time) Disposition Final User 09/17/2019 8:05:36 PM Attempt made - message left Danielle Spore, RN, Reynold Bowen 09/17/2019 8:19:13 PM Paged On Call back to [facility], [place], [place] 09/17/2019 8:19:45 PM Paged On Call back to [facility], [place], [place] 09/17/2019 8:19:36 PM Clinical Call Yes Danielle Fox, [place], [facility] Phone DateTime Result/Outcome Message Type Notes Scarlette Calico - MD [phone] 09/17/2019 8:19:13 PM Paged On Call Back to Call Center Doctor Paged please call Kim RN [place] [phone] Scarlette Calico - MD 09/17/2019 8:30:55 PM Spoke with On Call - General Message Result MD states to let caller know again that ER MD is in charge of her care while she is at ER. caller aware but continues to be concerned

## 2019-09-18 NOTE — Patient Instructions (Signed)
Ischemic Stroke  An ischemic stroke (cerebrovascular accident, or CVA) is the sudden death of brain tissue that occurs when an area of the brain does not get enough oxygen. It is a medical emergency that must be treated right away. An ischemic stroke can cause permanent loss of brain function. This can cause problems with how different parts of your body function. What are the causes? This condition is caused by a decrease of oxygen supply to an area of the brain, which may be the result of:  A small blood clot (embolus) or a buildup of plaque in the blood vessels (atherosclerosis) that blocks blood flow in the brain.  An abnormal heart rhythm (atrial fibrillation).  A blocked or damaged artery in the head or neck. Sometimes the cause of stroke is not known (cryptogenic). What increases the risk? Certain factors may make you more likely to develop this condition. Some of these factors are things that you can change, such as:  Obesity.  Smoking cigarettes.  Taking oral birth control, especially if you also use tobacco.  Physical inactivity.  Excessive alcohol use.  Use of illegal drugs, especially cocaine and methamphetamine. Other risk factors include:  High blood pressure (hypertension).  High cholesterol.  Diabetes mellitus.  Heart disease.  Being African American, Native American, Hispanic, or [place] Native.  Being over age 60.  Family history of stroke.  Previous history of blood clots, stroke, or transient ischemic attack (TIA).  Sickle cell disease.  Being a woman with a history of preeclampsia.  Migraine headache.  Sleep apnea.  Irregular heartbeats, such as atrial fibrillation.  Chronic inflammatory diseases, such as rheumatoid arthritis or lupus.  Blood clotting disorders (hypercoagulable state). What are the signs or symptoms? Symptoms of this condition usually develop suddenly, or you may notice them after waking up from sleep. Symptoms may include  sudden:  Weakness or numbness in your face, arm, or leg, especially on one side of your body.  Trouble walking or difficulty moving your arms or legs.  Loss of balance or coordination.  Confusion.  Slurred speech (dysarthria).  Trouble speaking, understanding speech, or both (aphasia).  Vision changes--such as double vision, blurred vision, or loss of vision--in one or both eyes.  Dizziness.  Nausea and vomiting.  Severe headache with no known cause. The headache is often described as the worst headache ever experienced. If possible, make note of the exact time that you last felt like your normal self and what time your symptoms started. Tell your health care provider. If symptoms come and go, this could be a sign of a warning stroke, or TIA. Get help right away, even if you feel better. How is this diagnosed? This condition may be diagnosed based on:  Your symptoms, your medical history, and a physical exam.  CT scan of the brain.  MRI.  CT angiogram. This test uses a computer to take X-rays of your arteries. A dye may be injected into your blood to show the inside of your blood vessels more clearly.  MRI angiogram. This is a type of MRI that is used to evaluate the blood vessels.  Cerebral angiogram. This test uses X-rays and a dye to show the blood vessels in the brain and neck. You may need to see a health care provider who specializes in stroke care. A stroke specialist can be seen in person or through communication using telephone or television technology (telemedicine). Other tests may also be done to find the cause of the stroke, such   as:  Electrocardiogram (ECG).  Continuous heart monitoring.  Echocardiogram.  Transesophageal echocardiogram (TEE).  Carotid ultrasound.  A scan of the brain circulation.  Blood tests.  Sleep study to check for sleep apnea. How is this treated? Treatment for this condition will depend on the duration, severity, and cause  of your symptoms and on the area of the brain affected. It is very important to get treatment at the first sign of stroke symptoms. Some treatments work better if they are done within 3-6 hours of the onset of stroke symptoms. These initial treatments may include:  Aspirin.  Medicines to control blood pressure.  Medicine given by injection to dissolve the blood clot (thrombolytic).  Treatments given directly to the affected artery to remove or dissolve the blood clot. Other treatment options may include:  Oxygen.  IV fluids.  Medicines to thin the blood (anticoagulants or antiplatelets).  Procedures to increase blood flow. Medicines and changes to your diet may be used to help treat and manage risk factors for stroke, such as diabetes, high cholesterol, and high blood pressure. After a stroke, you may work with physical, speech, mental health, or occupational therapists to help you recover. Follow these instructions at home: Medicines  Take over-the-counter and prescription medicines only as told by your health care provider.  If you were told to take a medicine to thin your blood, such as aspirin or an anticoagulant, take it exactly as told by your health care provider. ? Taking too much blood-thinning medicine can cause bleeding. ? If you do not take enough blood-thinning medicine, you will not have the protection that you need against another stroke and other problems.  Understand the side effects of taking anticoagulant medicine. When taking this type of medicine, make sure you: ? Hold pressure over any cuts for longer than usual. ? Tell your dentist and other health care providers that you are taking anticoagulants before you have any procedures that may cause bleeding. ? Avoid activities that may cause trauma or injury. Eating and drinking  Follow instructions from your health care provider about diet.  Eat healthy foods.  If your ability to swallow was affected by the  stroke, you may need to take steps to avoid choking, such as: ? Taking small bites when eating. ? Eating foods that are soft or pureed. Safety  Follow instructions from your health care team about physical activity.  Use a walker or cane as told by your health care provider.  Take steps to create a safe home environment in order to reduce the risk of falls. This may include: ? Having your home looked at by specialists. ? Installing grab bars in the bedroom and bathroom. ? Using safety equipment, such as raised toilets and a seat in the shower. General instructions  Do not use any tobacco products, such as cigarettes, chewing tobacco, and e-cigarettes. If you need help quitting, ask your health care provider.  Limit alcohol intake to no more than 1 drink a day for nonpregnant women and 2 drinks a day for men. One drink equals 12 oz of beer, 5 oz of wine, or 1 oz of hard liquor.  If you need help to stop using drugs or alcohol, ask your health care provider about a referral to a program or specialist.  Maintain an active and healthy lifestyle. Get regular exercise as told by your health care provider.  Keep all follow-up visits as told by your health care provider, including visits with all specialists on your   health care team. This is important. How is this prevented? Your risk of another stroke can be decreased by managing high blood pressure, high cholesterol, diabetes, heart disease, sleep apnea, and obesity. It can also be decreased by quitting smoking, limiting alcohol, and staying physically active. Your health care provider will continue to work with you on measures to prevent short-term and long-term complications of stroke. Get help right away if:   You have any symptoms of a stroke. "BE FAST" is an easy way to remember the main warning signs of a stroke: ? B - Balance. Signs are dizziness, sudden trouble walking, or loss of balance. ? E - Eyes. Signs are trouble seeing or a  sudden change in vision. ? F - Face. Signs are sudden weakness or numbness of the face, or the face or eyelid drooping on one side. ? A - Arms. Signs are weakness or numbness in an arm. This happens suddenly and usually on one side of the body. ? S - Speech. Signs are sudden trouble speaking, slurred speech, or trouble understanding what people say. ? T - Time. Time to call emergency services. Write down what time symptoms started.  You have other signs of a stroke, such as: ? A sudden, severe headache with no known cause. ? Nausea or vomiting. ? Seizure.  These symptoms may represent a serious problem that is an emergency. Do not wait to see if the symptoms will go away. Get medical help right away. Call your local emergency services (911 in the [place] Do not drive yourself to the hospital. Summary  An ischemic stroke (cerebrovascular accident, or CVA) is the sudden death of brain tissue that occurs when an area of the brain does not get enough oxygen.  Symptoms of this condition usually develop suddenly, or you may notice them after waking up from sleep.  It is very important to get treatment at the first sign of stroke symptoms. Stroke is a medical emergency that must be treated right away. This information is not intended to replace advice given to you by your health care provider. Make sure you discuss any questions you have with your health care provider. Document Revised: 11/04/2017 Document Reviewed: 05/14/2015 Elsevier Patient Education  2020 [employer]  

## 2019-09-18 NOTE — Telephone Encounter (Signed)
Looks like the patient spoke with the after hours nurse and the doc on call advised her that the ED doc was in charge.  Also it looks like patient left after being triaged.  This is the actual message from the long message below  caller states at ER at current time r/t numbness and weakness on one side of body. went to ER last night r/t same s/s and DX CVA. told to take 81 mg aspirin. caller states she is very worried as ER MD has not evaluated her. been waiting for approx 3 hours. no triage. caller would like to speak to on call MD direct

## 2019-09-18 NOTE — Telephone Encounter (Signed)
She has to be evaluated somewhere. If she has not been she either needs to go back to ER or come in here this afternoon but I cannot do as quick up a work up so if she is having a stroke here is too slow. Please check with patient.

## 2019-09-18 NOTE — Telephone Encounter (Signed)
Per Dr. Charlett Blake ok to put her on for today.  Patient scheduled

## 2019-09-19 ENCOUNTER — Encounter: Payer: Self-pay | Admitting: Neurology

## 2019-09-19 DIAGNOSIS — R6883 Chills (without fever): Secondary | ICD-10-CM | POA: Insufficient documentation

## 2019-09-19 DIAGNOSIS — E876 Hypokalemia: Secondary | ICD-10-CM | POA: Insufficient documentation

## 2019-09-19 DIAGNOSIS — Z8673 Personal history of transient ischemic attack (TIA), and cerebral infarction without residual deficits: Secondary | ICD-10-CM | POA: Insufficient documentation

## 2019-09-19 LAB — CBC WITH DIFFERENTIAL/PLATELET
Basophils Absolute: 0 10*3/uL (ref 0.0–0.1)
Basophils Relative: 0.5 % (ref 0.0–3.0)
Eosinophils Absolute: 0.3 10*3/uL (ref 0.0–0.7)
Eosinophils Relative: 4.5 % (ref 0.0–5.0)
HCT: 35.3 % — ABNORMAL LOW (ref 36.0–46.0)
Hemoglobin: 11.8 g/dL — ABNORMAL LOW (ref 12.0–15.0)
Lymphocytes Relative: 17.4 % (ref 12.0–46.0)
Lymphs Abs: 1 10*3/uL (ref 0.7–4.0)
MCHC: 33.6 g/dL (ref 30.0–36.0)
MCV: 81.7 fl (ref 78.0–100.0)
Monocytes Absolute: 0.8 10*3/uL (ref 0.1–1.0)
Monocytes Relative: 13.8 % — ABNORMAL HIGH (ref 3.0–12.0)
Neutro Abs: 3.7 10*3/uL (ref 1.4–7.7)
Neutrophils Relative %: 63.8 % (ref 43.0–77.0)
Platelets: 400 10*3/uL (ref 150.0–400.0)
RBC: 4.31 Mil/uL (ref 3.87–5.11)
RDW: 14.9 % (ref 11.5–15.5)
WBC: 5.9 10*3/uL (ref 4.0–10.5)

## 2019-09-19 LAB — URINE CULTURE
MICRO NUMBER:: 10726625
SPECIMEN QUALITY:: ADEQUATE

## 2019-09-19 LAB — COMPREHENSIVE METABOLIC PANEL
ALT: 13 U/L (ref 0–35)
AST: 20 U/L (ref 0–37)
Albumin: 3.8 g/dL (ref 3.5–5.2)
Alkaline Phosphatase: 81 U/L (ref 39–117)
BUN: 10 mg/dL (ref 6–23)
CO2: 27 mEq/L (ref 19–32)
Calcium: 9.2 mg/dL (ref 8.4–10.5)
Chloride: 101 mEq/L (ref 96–112)
Creatinine, Ser: 0.89 mg/dL (ref 0.40–1.20)
GFR: 62.85 mL/min (ref >60.00)
Glucose, Bld: 92 mg/dL (ref 70–99)
Potassium: 4.3 mEq/L (ref 3.5–5.1)
Sodium: 134 mEq/L — ABNORMAL LOW (ref 135–145)
Total Bilirubin: 0.3 mg/dL (ref 0.2–1.2)
Total Protein: 7.1 g/dL (ref 6.0–8.3)

## 2019-09-19 LAB — URINALYSIS
Bilirubin Urine: NEGATIVE
Hgb urine dipstick: NEGATIVE
Leukocytes,Ua: NEGATIVE
Nitrite: NEGATIVE
Specific Gravity, Urine: 1.02 (ref 1.000–1.030)
Total Protein, Urine: NEGATIVE
Urine Glucose: NEGATIVE
Urobilinogen, UA: 0.2 (ref 0.0–1.0)
pH: 6 (ref 5.0–8.0)

## 2019-09-19 NOTE — Assessment & Plan Note (Signed)
Supplement and monitor 

## 2019-09-19 NOTE — Assessment & Plan Note (Signed)
ucheck urinalysis and blood work. Report worsening symptoms

## 2019-09-19 NOTE — Assessment & Plan Note (Signed)
Patient has just returned yesterday from [place]. While she was there she had a CVA and presented to [facility], [place], [place] [phone]. We do not have access to the records but she reports MRI confirmed a CVA leading to weakness and numbness in her left arm. She was in the hospital for several days and showed marked improvement she notes also having some trouble with word finding as well. She was feeling notably better but while on her way back here she suffered a very brief episode of her numbness above her upper lip and a spasm in her left hand. Only seconds in length. She presented to the ER but left before her work up was done. Her CT scan was not remarkable for any new lesions. We have requested the MRI and work up from [place]. New MRI is ordered to evaluate the new episode and referral to neurology was made. She is asked to increase her ECASA 81 mg to bid. Seek care if symptoms worsen.

## 2019-09-19 NOTE — Assessment & Plan Note (Signed)
Asymptomatic, recheck cmp

## 2019-09-19 NOTE — Progress Notes (Signed)
Subjective:    Patient ID: Danielle Fox, female    DOB: 04-05-1950, 69 y.o.   MRN: [id number]  Chief Complaint  Patient presents with  . left sided weakness with a little ache    HPI Patient is in today for evaluation of neurologic concerns. Patient has just returned yesterday from [place]. While she was there she had a CVA and presented to [facility], [place], [place] [phone]. We do not have access to the records but she reports MRI confirmed a CVA leading to weakness and numbness in her left arm. She was in the hospital for several days and showed marked improvement she notes also having some trouble with word finding as well. She was feeling notably better but while on her way back here she suffered a very brief episode of her numbness above her upper lip and a spasm in her left hand. Only seconds in length. She presented to the ER but left before her work up was done. Her CT scan was not remarkable for any new lesions. She also notes a poor appetite and increased anxiety during this week as well. Denies CP/palp/SOB/HA/congestion/fevers/GI or GU c/o. Taking meds as prescribed  Past Medical History:  Diagnosis Date  . Allergy   . Clostridioides difficile infection 10/19/2018   tested 10/2018 negative  . Colon polyps   . Constipation 10/06/2014  . COPD (chronic obstructive pulmonary disease) ([place])   . Osteopenia   . 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 Preventative health care 06/29/2016  . Pruritus 06/29/2016  . Welcome to Medicare preventive visit 06/29/2016    Past Surgical History:  Procedure Laterality Date  . APPENDECTOMY    . COLONOSCOPY     01-14-2005  . SINUS IRRIGATION  04/02/2015  . UPPER GASTROINTESTINAL ENDOSCOPY  11/22/2018    Family History  Problem Relation Age of Onset  . Heart disease Mother        cabg, MI  . Alcohol abuse Father   . Cirrhosis Father   . Heart disease Father        MI  . Colon cancer Neg Hx   . Colon polyps Neg Hx   .  Esophageal cancer Neg Hx   . Stomach cancer Neg Hx   . Rectal cancer Neg Hx     Social History   Socioeconomic History  . Marital status: Significant Other    Spouse name: Not on file  . Number of children: 3  . Years of education: Not on file  . Highest education level: Not on file  Occupational History  . 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 Smoking status: Former Smoker    Packs/day: 1.00    Years: 48.00    Pack years: 48.00    Types: Cigarettes    Quit date: 02/19/2014    Years since quitting: 5.5  . Smokeless tobacco: Never Used  . Tobacco comment: Quit and has no desire to smoke again,  Vaping Use  . Vaping Use: Never used  Substance and Sexual Activity  . Alcohol use: Yes    Alcohol/week: 0.0 standard drinks    Comment: socially  . Drug use: No  . Sexual activity: Yes    Partners: Male  Other Topics Concern  . Not on file  Social History Narrative   Exercise-- walks dog on occassion   Social Determinants of Health   Financial Resource Strain:   .  Difficulty of Paying Living Expenses:   Food Insecurity:   . Worried About Charity fundraiser in the Last Year:   . Arboriculturist in the Last Year:   Transportation Needs:   . Film/video editor (Medical):   Marland Kitchen Lack of Transportation (Non-Medical):   Physical Activity:   . Days of Exercise per Week:   . Minutes of Exercise per Session:   Stress:   . Feeling of Stress :   Social Connections:   . Frequency of Communication with Friends and Family:   . Frequency of Social Gatherings with Friends and Family:   . Attends Religious Services:   . Active Member of Clubs or Organizations:   . Attends Archivist Meetings:   Marland Kitchen Marital Status:   Intimate Partner Violence:   . Fear of Current or Ex-Partner:   . Emotionally Abused:   Marland Kitchen Physically Abused:   . Sexually Abused:     Outpatient Medications Prior to Visit  Medication Sig Dispense Refill  .  albuterol (PROVENTIL HFA;VENTOLIN HFA) 108 (90 Base) MCG/ACT inhaler Inhale 2 puffs into the lungs every 6 (six) hours as needed for wheezing or shortness of breath. 1 Inhaler 2  . atorvastatin (LIPITOR) 40 MG tablet Take 1 tablet by mouth at bedtime.    . budesonide-formoterol (SYMBICORT) 80-4.5 MCG/ACT inhaler Inhale 2 puffs into the lungs in the morning and at bedtime. 30.6 g 1  . cetirizine (ZYRTEC) 10 MG tablet Take 1 tablet (10 mg total) by mouth daily. 30 tablet 11  . esomeprazole (NEXIUM) 40 MG capsule Take 1 capsule (40 mg total) by mouth daily before breakfast. 90 capsule 3  . aspirin 81 MG EC tablet Take 1 tablet by mouth daily.    . predniSONE (DELTASONE) 10 MG tablet 4 tabs by mouth once daily for 2 days, then 3 tabs daily x 2 days, then 2 tabs daily x 2 days, then 1 tab daily x 2 days 20 tablet 0   No facility-administered medications prior to visit.    Allergies  Allergen Reactions  . Doxycycline Nausea And Vomiting  . Erythromycin Base     Other reaction(s): GI Upset (intolerance)    Review of Systems  Constitutional: Positive for chills and malaise/fatigue. Negative for fever.  HENT: Negative for congestion.   Eyes: Negative for blurred vision.  Respiratory: Negative for shortness of breath.   Cardiovascular: Negative for chest pain, palpitations and leg swelling.  Gastrointestinal: Negative for abdominal pain, blood in stool and nausea.  Genitourinary: Negative for dysuria and frequency.  Musculoskeletal: Negative for falls.  Skin: Negative for rash.  Neurological: Positive for sensory change, focal weakness and weakness. Negative for dizziness, loss of consciousness and headaches.  Endo/Heme/Allergies: Negative for environmental allergies.  Psychiatric/Behavioral: Negative for depression. The patient is nervous/anxious.        Objective:    Physical Exam Vitals and nursing note reviewed.  Constitutional:      General: She is not in acute distress.     Appearance: She is well-developed.  HENT:     Head: Normocephalic and atraumatic.     Nose: Nose normal.  Eyes:     General:        Right eye: No discharge.        Left eye: No discharge.  Cardiovascular:     Rate and Rhythm: Normal rate and regular rhythm.     Heart sounds: No murmur heard.   Pulmonary:     Effort:  Pulmonary effort is normal.     Breath sounds: Normal breath sounds.  Abdominal:     General: Bowel sounds are normal.     Palpations: Abdomen is soft.     Tenderness: There is no abdominal tenderness.  Musculoskeletal:        General: Normal range of motion.     Cervical back: Normal range of motion and neck supple.  Skin:    General: Skin is warm and dry.  Neurological:     General: No focal deficit present.     Mental Status: She is alert and oriented to person, place, and time.     BP 100/60 (BP Location: Right Arm, Cuff Size: Normal)   Pulse 88   Temp 98.5 F (36.9 C) (Oral)   Resp 12   Ht 5\' 1"  (1.549 m)   Wt 117 lb 6.4 oz (53.3 kg)   SpO2 97%   BMI 22.18 kg/m  Wt Readings from Last 3 Encounters:  09/18/19 117 lb 6.4 oz (53.3 kg)  09/17/19 117 lb 1 oz (53.1 kg)  08/27/19 117 lb (53.1 kg)    Diabetic Foot Exam - Simple   No data filed     Lab Results  Component Value Date   WBC 5.9 09/18/2019   HGB 11.8 (L) 09/18/2019   HCT 35.3 (L) 09/18/2019   PLT 400.0 09/18/2019   GLUCOSE 92 09/18/2019   CHOL 230 (H) 01/04/2017   TRIG 127.0 01/04/2017   HDL 70.50 01/04/2017   LDLDIRECT 155.9 12/14/2012   LDLCALC 134 (H) 01/04/2017   ALT 13 09/18/2019   AST 20 09/18/2019   NA 134 (L) 09/18/2019   K 4.3 09/18/2019   CL 101 09/18/2019   CREATININE 0.89 09/18/2019   BUN 10 09/18/2019   CO2 27 09/18/2019   TSH 1.75 09/27/2018    Lab Results  Component Value Date   TSH 1.75 09/27/2018   Lab Results  Component Value Date   WBC 5.9 09/18/2019   HGB 11.8 (L) 09/18/2019   HCT 35.3 (L) 09/18/2019   MCV 81.7 09/18/2019   PLT 400.0 09/18/2019     Lab Results  Component Value Date   NA 134 (L) 09/18/2019   K 4.3 09/18/2019   CO2 27 09/18/2019   GLUCOSE 92 09/18/2019   BUN 10 09/18/2019   CREATININE 0.89 09/18/2019   BILITOT 0.3 09/18/2019   ALKPHOS 81 09/18/2019   AST 20 09/18/2019   ALT 13 09/18/2019   PROT 7.1 09/18/2019   ALBUMIN 3.8 09/18/2019   CALCIUM 9.2 09/18/2019   ANIONGAP 8 09/17/2019   GFR 62.85 09/18/2019   Lab Results  Component Value Date   CHOL 230 (H) 01/04/2017   Lab Results  Component Value Date   HDL 70.50 01/04/2017   Lab Results  Component Value Date   LDLCALC 134 (H) 01/04/2017   Lab Results  Component Value Date   TRIG 127.0 01/04/2017   Lab Results  Component Value Date   CHOLHDL 3 01/04/2017   No results found for: HGBA1C     Assessment & Plan:   Problem List Items Addressed This Visit    Vitamin D deficiency    Supplement and monitor      H/O: CVA (cerebrovascular accident)    Patient has just returned yesterday from [place]. While she was there she had a CVA and presented to [facility], [place], [place] [phone]. We do not have access to the records but she reports MRI  confirmed a CVA leading to weakness and numbness in her left arm. She was in the hospital for several days and showed marked improvement she notes also having some trouble with word finding as well. She was feeling notably better but while on her way back here she suffered a very brief episode of her numbness above her upper lip and a spasm in her left hand. Only seconds in length. She presented to the ER but left before her work up was done. Her CT scan was not remarkable for any new lesions. We have requested the MRI and work up from [place]. New MRI is ordered to evaluate the new episode and referral to neurology was made. She is asked to increase her ECASA 81 mg to bid. Seek care if symptoms worsen.      Chills    ucheck urinalysis and blood work. Report worsening symptoms      Relevant  Orders   Urinalysis (Completed)   Urine Culture   CBC w/Diff (Completed)   Hypokalemia    Asymptomatic, recheck cmp      Relevant Orders   Comprehensive metabolic panel (Completed)    Other Visit Diagnoses    Cerebrovascular accident (CVA), unspecified mechanism ([place])    -  Primary   Relevant Medications   atorvastatin (LIPITOR) 40 MG tablet   aspirin 81 MG EC tablet   Other Relevant Orders   Ambulatory referral to Neurology   MR Brain W Wo Contrast      I have discontinued Verlon M. Faulcon "Danielle Fox"'s predniSONE. I have also changed her aspirin. Additionally, I am having her maintain her albuterol, esomeprazole, budesonide-formoterol, cetirizine, and atorvastatin.  Meds ordered this encounter  Medications  . aspirin 81 MG EC tablet    Sig: Take 1 tablet (81 mg total) by mouth in the morning and at bedtime.    Dispense:  30 tablet     Penni Homans, MD

## 2019-09-20 ENCOUNTER — Ambulatory Visit: Payer: Medicare Other | Admitting: Family Medicine

## 2019-09-22 ENCOUNTER — Other Ambulatory Visit: Payer: Self-pay

## 2019-09-22 ENCOUNTER — Ambulatory Visit (HOSPITAL_BASED_OUTPATIENT_CLINIC_OR_DEPARTMENT_OTHER)
Admission: RE | Admit: 2019-09-22 | Discharge: 2019-09-22 | Disposition: A | Discharge status: Home / Self Care | Payer: Medicare Other | Source: Ambulatory Visit | Attending: Family Medicine | Admitting: Family Medicine

## 2019-09-22 DIAGNOSIS — R200 Anesthesia of skin: Secondary | ICD-10-CM | POA: Diagnosis not present

## 2019-09-22 DIAGNOSIS — I639 Cerebral infarction, unspecified: Secondary | ICD-10-CM | POA: Insufficient documentation

## 2019-09-22 MED ORDER — GADOBUTROL 1 MMOL/ML IV SOLN
5.0000 mL | Freq: Once | INTRAVENOUS | Status: AC | PRN
  Administered 2019-09-22: 5 mL via INTRAVENOUS

## 2019-09-25 ENCOUNTER — Encounter: Payer: Self-pay | Admitting: Family Medicine

## 2019-09-27 ENCOUNTER — Emergency Department (HOSPITAL_COMMUNITY): Payer: Medicare Other

## 2019-09-27 ENCOUNTER — Encounter (HOSPITAL_COMMUNITY): Payer: Self-pay

## 2019-09-27 ENCOUNTER — Inpatient Hospital Stay (HOSPITAL_COMMUNITY)
Admission: EM | Admit: 2019-09-27 | Discharge: 2019-10-01 | DRG: 057 | Disposition: A | Discharge status: Home / Self Care | Payer: Medicare Other | Attending: Internal Medicine | Admitting: Internal Medicine

## 2019-09-27 ENCOUNTER — Inpatient Hospital Stay (HOSPITAL_COMMUNITY): Payer: Medicare Other

## 2019-09-27 ENCOUNTER — Telehealth: Payer: Self-pay | Admitting: Neurology

## 2019-09-27 ENCOUNTER — Other Ambulatory Visit: Payer: Self-pay

## 2019-09-27 DIAGNOSIS — Z8249 Family history of ischemic heart disease and other diseases of the circulatory system: Secondary | ICD-10-CM

## 2019-09-27 DIAGNOSIS — G0490 Encephalitis and encephalomyelitis, unspecified: Secondary | ICD-10-CM | POA: Diagnosis not present

## 2019-09-27 DIAGNOSIS — R531 Weakness: Secondary | ICD-10-CM | POA: Diagnosis not present

## 2019-09-27 DIAGNOSIS — R4789 Other speech disturbances: Secondary | ICD-10-CM | POA: Diagnosis not present

## 2019-09-27 DIAGNOSIS — R9089 Other abnormal findings on diagnostic imaging of central nervous system: Secondary | ICD-10-CM | POA: Diagnosis present

## 2019-09-27 DIAGNOSIS — I6389 Other cerebral infarction: Secondary | ICD-10-CM

## 2019-09-27 DIAGNOSIS — G248 Other dystonia: Secondary | ICD-10-CM | POA: Diagnosis present

## 2019-09-27 DIAGNOSIS — R569 Unspecified convulsions: Secondary | ICD-10-CM | POA: Diagnosis not present

## 2019-09-27 DIAGNOSIS — Z7951 Long term (current) use of inhaled steroids: Secondary | ICD-10-CM

## 2019-09-27 DIAGNOSIS — C7949 Secondary malignant neoplasm of other parts of nervous system: Secondary | ICD-10-CM

## 2019-09-27 DIAGNOSIS — R29810 Facial weakness: Secondary | ICD-10-CM | POA: Diagnosis not present

## 2019-09-27 DIAGNOSIS — R29818 Other symptoms and signs involving the nervous system: Secondary | ICD-10-CM | POA: Diagnosis not present

## 2019-09-27 DIAGNOSIS — R9431 Abnormal electrocardiogram [ECG] [EKG]: Secondary | ICD-10-CM | POA: Diagnosis not present

## 2019-09-27 DIAGNOSIS — N9489 Other specified conditions associated with female genital organs and menstrual cycle: Secondary | ICD-10-CM

## 2019-09-27 DIAGNOSIS — Z7982 Long term (current) use of aspirin: Secondary | ICD-10-CM

## 2019-09-27 DIAGNOSIS — R7303 Prediabetes: Secondary | ICD-10-CM | POA: Diagnosis not present

## 2019-09-27 DIAGNOSIS — R519 Headache, unspecified: Secondary | ICD-10-CM | POA: Diagnosis present

## 2019-09-27 DIAGNOSIS — I639 Cerebral infarction, unspecified: Secondary | ICD-10-CM

## 2019-09-27 DIAGNOSIS — Z79899 Other long term (current) drug therapy: Secondary | ICD-10-CM | POA: Diagnosis not present

## 2019-09-27 DIAGNOSIS — R1909 Other intra-abdominal and pelvic swelling, mass and lump: Secondary | ICD-10-CM | POA: Diagnosis present

## 2019-09-27 DIAGNOSIS — Z87891 Personal history of nicotine dependence: Secondary | ICD-10-CM

## 2019-09-27 DIAGNOSIS — J439 Emphysema, unspecified: Secondary | ICD-10-CM | POA: Diagnosis not present

## 2019-09-27 DIAGNOSIS — Z20822 Contact with and (suspected) exposure to covid-19: Secondary | ICD-10-CM | POA: Diagnosis present

## 2019-09-27 DIAGNOSIS — G8194 Hemiplegia, unspecified affecting left nondominant side: Principal | ICD-10-CM | POA: Diagnosis present

## 2019-09-27 DIAGNOSIS — E785 Hyperlipidemia, unspecified: Secondary | ICD-10-CM | POA: Diagnosis present

## 2019-09-27 DIAGNOSIS — Z881 Allergy status to other antibiotic agents status: Secondary | ICD-10-CM

## 2019-09-27 DIAGNOSIS — I700 Atherosclerosis of aorta: Secondary | ICD-10-CM | POA: Diagnosis not present

## 2019-09-27 DIAGNOSIS — R279 Unspecified lack of coordination: Secondary | ICD-10-CM | POA: Diagnosis not present

## 2019-09-27 DIAGNOSIS — G969 Disorder of central nervous system, unspecified: Secondary | ICD-10-CM | POA: Diagnosis present

## 2019-09-27 DIAGNOSIS — I6523 Occlusion and stenosis of bilateral carotid arteries: Secondary | ICD-10-CM | POA: Diagnosis not present

## 2019-09-27 DIAGNOSIS — G039 Meningitis, unspecified: Secondary | ICD-10-CM | POA: Diagnosis not present

## 2019-09-27 DIAGNOSIS — G038 Meningitis due to other specified causes: Secondary | ICD-10-CM | POA: Diagnosis not present

## 2019-09-27 DIAGNOSIS — E161 Other hypoglycemia: Secondary | ICD-10-CM | POA: Diagnosis not present

## 2019-09-27 DIAGNOSIS — I708 Atherosclerosis of other arteries: Secondary | ICD-10-CM | POA: Diagnosis present

## 2019-09-27 DIAGNOSIS — E162 Hypoglycemia, unspecified: Secondary | ICD-10-CM | POA: Diagnosis not present

## 2019-09-27 DIAGNOSIS — Z9049 Acquired absence of other specified parts of digestive tract: Secondary | ICD-10-CM

## 2019-09-27 DIAGNOSIS — R700 Elevated erythrocyte sedimentation rate: Secondary | ICD-10-CM | POA: Diagnosis not present

## 2019-09-27 DIAGNOSIS — I6602 Occlusion and stenosis of left middle cerebral artery: Secondary | ICD-10-CM | POA: Diagnosis not present

## 2019-09-27 DIAGNOSIS — J984 Other disorders of lung: Secondary | ICD-10-CM | POA: Diagnosis not present

## 2019-09-27 DIAGNOSIS — C801 Malignant (primary) neoplasm, unspecified: Secondary | ICD-10-CM

## 2019-09-27 DIAGNOSIS — Z743 Need for continuous supervision: Secondary | ICD-10-CM | POA: Diagnosis not present

## 2019-09-27 DIAGNOSIS — G4489 Other headache syndrome: Secondary | ICD-10-CM | POA: Diagnosis not present

## 2019-09-27 LAB — COMPREHENSIVE METABOLIC PANEL
ALT: 15 U/L (ref 0–44)
AST: 16 U/L (ref 15–41)
Albumin: 3.3 g/dL — ABNORMAL LOW (ref 3.5–5.0)
Alkaline Phosphatase: 79 U/L (ref 38–126)
Anion gap: 12 (ref 5–15)
BUN: 6 mg/dL — ABNORMAL LOW (ref 8–23)
CO2: 23 mmol/L (ref 22–32)
Calcium: 9.4 mg/dL (ref 8.9–10.3)
Chloride: 100 mmol/L (ref 98–111)
Creatinine, Ser: 0.83 mg/dL (ref 0.44–1.00)
GFR calc Af Amer: >60 mL/min (ref >60)
GFR calc non Af Amer: >60 mL/min (ref >60)
Glucose, Bld: 90 mg/dL (ref 70–99)
Potassium: 4.2 mmol/L (ref 3.5–5.1)
Sodium: 135 mmol/L (ref 135–145)
Total Bilirubin: 0.6 mg/dL (ref 0.3–1.2)
Total Protein: 7.1 g/dL (ref 6.5–8.1)

## 2019-09-27 LAB — CBC
HCT: 39.5 % (ref 36.0–46.0)
Hemoglobin: 12.3 g/dL (ref 12.0–15.0)
MCH: 26 pg (ref 26.0–34.0)
MCHC: 31.1 g/dL (ref 30.0–36.0)
MCV: 83.5 fL (ref 80.0–100.0)
Platelets: 412 10*3/uL — ABNORMAL HIGH (ref 150–400)
RBC: 4.73 MIL/uL (ref 3.87–5.11)
RDW: 13.9 % (ref 11.5–15.5)
WBC: 11.6 10*3/uL — ABNORMAL HIGH (ref 4.0–10.5)
nRBC: 0 % (ref 0.0–0.2)

## 2019-09-27 LAB — URINALYSIS, ROUTINE W REFLEX MICROSCOPIC
Bacteria, UA: NONE SEEN
Bilirubin Urine: NEGATIVE
Glucose, UA: NEGATIVE mg/dL
Hgb urine dipstick: NEGATIVE
Ketones, ur: 20 mg/dL — AB
Nitrite: NEGATIVE
Protein, ur: NEGATIVE mg/dL
Specific Gravity, Urine: >1.046 — ABNORMAL HIGH (ref 1.005–1.030)
pH: 6 (ref 5.0–8.0)

## 2019-09-27 LAB — DIFFERENTIAL
Abs Immature Granulocytes: 0.06 10*3/uL (ref 0.00–0.07)
Basophils Absolute: 0.1 10*3/uL (ref 0.0–0.1)
Basophils Relative: 0 %
Eosinophils Absolute: 0 10*3/uL (ref 0.0–0.5)
Eosinophils Relative: 0 %
Immature Granulocytes: 1 %
Lymphocytes Relative: 9 %
Lymphs Abs: 1 10*3/uL (ref 0.7–4.0)
Monocytes Absolute: 1.1 10*3/uL — ABNORMAL HIGH (ref 0.1–1.0)
Monocytes Relative: 9 %
Neutro Abs: 9.4 10*3/uL — ABNORMAL HIGH (ref 1.7–7.7)
Neutrophils Relative %: 81 %

## 2019-09-27 LAB — PROTIME-INR
INR: 1 (ref 0.8–1.2)
Prothrombin Time: 13.1 seconds (ref 11.4–15.2)

## 2019-09-27 LAB — I-STAT CHEM 8, ED
BUN: 6 mg/dL — ABNORMAL LOW (ref 8–23)
Calcium, Ion: 1.12 mmol/L — ABNORMAL LOW (ref 1.15–1.40)
Chloride: 99 mmol/L (ref 98–111)
Creatinine, Ser: 0.7 mg/dL (ref 0.44–1.00)
Glucose, Bld: 89 mg/dL (ref 70–99)
HCT: 39 % (ref 36.0–46.0)
Hemoglobin: 13.3 g/dL (ref 12.0–15.0)
Potassium: 4.1 mmol/L (ref 3.5–5.1)
Sodium: 135 mmol/L (ref 135–145)
TCO2: 25 mmol/L (ref 22–32)

## 2019-09-27 LAB — SEDIMENTATION RATE: Sed Rate: 55 mm/hr — ABNORMAL HIGH (ref 0–22)

## 2019-09-27 LAB — RAPID URINE DRUG SCREEN, HOSP PERFORMED
Amphetamines: NOT DETECTED
Barbiturates: NOT DETECTED
Benzodiazepines: NOT DETECTED
Cocaine: NOT DETECTED
Opiates: NOT DETECTED
Tetrahydrocannabinol: NOT DETECTED

## 2019-09-27 LAB — ETHANOL: Alcohol, Ethyl (B): <10 mg/dL (ref <10)

## 2019-09-27 LAB — HIV ANTIBODY (ROUTINE TESTING W REFLEX): HIV Screen 4th Generation wRfx: NONREACTIVE

## 2019-09-27 LAB — SARS CORONAVIRUS 2 BY RT PCR (HOSPITAL ORDER, PERFORMED IN ~~LOC~~ HOSPITAL LAB): SARS Coronavirus 2: NEGATIVE

## 2019-09-27 LAB — APTT: aPTT: 34 seconds (ref 24–36)

## 2019-09-27 LAB — VITAMIN D 25 HYDROXY (VIT D DEFICIENCY, FRACTURES): Vit D, 25-Hydroxy: 18.27 ng/mL — ABNORMAL LOW (ref 30–100)

## 2019-09-27 MED ORDER — ATORVASTATIN CALCIUM 40 MG PO TABS
40.0000 mg | ORAL_TABLET | Freq: Every day | ORAL | Status: DC
  Administered 2019-09-28 – 2019-09-30 (×3): 40 mg via ORAL
  Filled 2019-09-27 (×6): qty 1

## 2019-09-27 MED ORDER — ASPIRIN EC 81 MG PO TBEC
81.0000 mg | DELAYED_RELEASE_TABLET | Freq: Every day | ORAL | Status: DC
  Administered 2019-09-27 – 2019-10-01 (×5): 81 mg via ORAL
  Filled 2019-09-27 (×5): qty 1

## 2019-09-27 MED ORDER — ALBUTEROL SULFATE (2.5 MG/3ML) 0.083% IN NEBU: 2.5000 mg | INHALATION_SOLUTION | Freq: Four times a day (QID) | RESPIRATORY_TRACT | Status: DC | PRN

## 2019-09-27 MED ORDER — ACETAMINOPHEN 160 MG/5ML PO SOLN: 650.0000 mg | ORAL | Status: DC | PRN

## 2019-09-27 MED ORDER — SENNOSIDES-DOCUSATE SODIUM 8.6-50 MG PO TABS: 1.0000 | ORAL_TABLET | Freq: Every evening | ORAL | Status: DC | PRN

## 2019-09-27 MED ORDER — PANTOPRAZOLE SODIUM 40 MG PO TBEC
40.0000 mg | DELAYED_RELEASE_TABLET | Freq: Every day | ORAL | Status: DC
  Administered 2019-09-27 – 2019-10-01 (×5): 40 mg via ORAL
  Filled 2019-09-27 (×5): qty 1

## 2019-09-27 MED ORDER — SODIUM CHLORIDE 0.9 % IV SOLN: INTRAVENOUS | Status: AC

## 2019-09-27 MED ORDER — STROKE: EARLY STAGES OF RECOVERY BOOK
Freq: Once | Status: AC
  Filled 2019-09-27: qty 1

## 2019-09-27 MED ORDER — IOHEXOL 350 MG/ML SOLN
50.0000 mL | Freq: Once | INTRAVENOUS | Status: AC | PRN
  Administered 2019-09-27: 50 mL via INTRAVENOUS

## 2019-09-27 MED ORDER — ENOXAPARIN SODIUM 40 MG/0.4ML ~~LOC~~ SOLN
40.0000 mg | SUBCUTANEOUS | Status: DC
  Administered 2019-09-27 – 2019-09-30 (×3): 40 mg via SUBCUTANEOUS
  Filled 2019-09-27 (×5): qty 0.4

## 2019-09-27 MED ORDER — CLOPIDOGREL BISULFATE 75 MG PO TABS
75.0000 mg | ORAL_TABLET | Freq: Every day | ORAL | Status: DC
  Administered 2019-09-27 – 2019-10-01 (×5): 75 mg via ORAL
  Filled 2019-09-27 (×5): qty 1

## 2019-09-27 MED ORDER — ACETAMINOPHEN 650 MG RE SUPP: 650.0000 mg | RECTAL | Status: DC | PRN

## 2019-09-27 MED ORDER — CALCIUM CARBONATE 1250 (500 CA) MG PO TABS: 1.0000 | ORAL_TABLET | Freq: Every day | ORAL | Status: DC

## 2019-09-27 MED ORDER — LORAZEPAM 0.5 MG PO TABS: 0.5000 mg | ORAL_TABLET | Freq: Four times a day (QID) | ORAL | Status: DC | PRN

## 2019-09-27 MED ORDER — FLUTICASONE FUROATE-VILANTEROL 100-25 MCG/INH IN AEPB
1.0000 | INHALATION_SPRAY | Freq: Every day | RESPIRATORY_TRACT | Status: DC
  Administered 2019-09-28: 1 via RESPIRATORY_TRACT
  Filled 2019-09-27: qty 28

## 2019-09-27 MED ORDER — LORATADINE 10 MG PO TABS: 10.0000 mg | ORAL_TABLET | Freq: Every day | ORAL | Status: DC | PRN

## 2019-09-27 MED ORDER — CALCIUM CARBONATE 1250 (500 CA) MG PO TABS
1.0000 | ORAL_TABLET | Freq: Every day | ORAL | Status: DC
  Administered 2019-09-27 – 2019-10-01 (×5): 500 mg via ORAL
  Filled 2019-09-27 (×5): qty 1

## 2019-09-27 MED ORDER — ACETAMINOPHEN 325 MG PO TABS
650.0000 mg | ORAL_TABLET | ORAL | Status: DC | PRN
  Administered 2019-09-27 – 2019-10-01 (×10): 650 mg via ORAL
  Filled 2019-09-27 (×9): qty 2

## 2019-09-27 NOTE — H&P (Addendum)
History and Physical    Danielle Fox OHY:[id number] DOB: 1950/05/26 DOA: 09/27/2019  PCP: Mosie Lukes, MD (Confirm with patient/family/NH records and if not entered, this has to be entered at [facility] point of entry) Patient coming from: Home  I have personally briefly reviewed patient's old medical records in [place]  Chief Complaint: Worsening of left-sided weakness and left facial droop  HPI: Danielle Fox is a 69 y.o. female with medical history significant of recent stroke on 7/17, prediabetes, mild intermittent COPD, HLD, presented with worsening of stroke symptoms.  Patient was on the trip to [place] when she developed left arm weakness, she described as " my arm will not do what I wanted to do and coordination is off" and she went to a local ER 1 week later, MRI showed subtle enhancement on the right posterior frontal lobe and anterior right parietal lobe which favored subacute cortical infarct.  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  During that hospital stay, her left-sided weakness resolved and she was discharged.  While traveling back to [place], patient developed numbness of left upper lip and spasm of the left arm.  She went to her PCP 9 days ago, who ordered another MRI, which was done 3 days ago.  MRI done 3 days ago showed cortical hyperintensity right anterior hemisphere which correlated to the similar finding in MRI done in [place].  This morning, patient woke up with left facial droop and worsening of left arm weakness, she could not raise her arm above left shoulder.  Which is significant deterioration compared to her initial stroke presentation 12 days ago.  Patient also reported she developed tonic headache under right head overnight.  Patient denied any chest pain, no blurry vision.  ED Course: CTA showed no significant intracranial stenosis, CTA of the showed 70% narrowing of  the origin of the left subclavian artery.  Review of Systems: As per HPI otherwise 10 point review of systems negative.    Past Medical History:  Diagnosis Date  . Allergy   . Clostridioides difficile infection 10/19/2018   tested 10/2018 negative  . Colon polyps   . Constipation 10/06/2014  . COPD (chronic obstructive pulmonary disease) ([place])   . Osteopenia   . 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 Preventative health care 06/29/2016  . Pruritus 06/29/2016  . Welcome to Medicare preventive visit 06/29/2016    Past Surgical History:  Procedure Laterality Date  . APPENDECTOMY    . COLONOSCOPY     01-14-2005  . SINUS IRRIGATION  04/02/2015  . UPPER GASTROINTESTINAL ENDOSCOPY  11/22/2018     reports that she quit smoking about 5 years ago. Her smoking use included cigarettes. She has a 48.00 pack-year smoking history. She has never used smokeless tobacco. She reports current alcohol use. She reports that she does not use drugs.  Allergies  Allergen Reactions  . Doxycycline Nausea And Vomiting  . Erythromycin Base     Other reaction(s): GI Upset (intolerance)    Family History  Problem Relation Age of Onset  . Heart disease Mother        cabg, MI  . Alcohol abuse Father   . Cirrhosis Father   . Heart disease Father        MI  . Colon cancer Neg Hx   . Colon polyps Neg Hx   . Esophageal cancer Neg Hx   .  Stomach cancer Neg Hx   . Rectal cancer Neg Hx      Prior to Admission medications   Medication Sig Start Date End Date Taking? Authorizing Provider  aspirin 81 MG EC tablet Take 1 tablet (81 mg total) by mouth in the morning and at bedtime. 09/18/19 10/19/19 Yes Mosie Lukes, MD  atorvastatin (LIPITOR) 40 MG tablet Take 1 tablet by mouth at bedtime. 09/16/19 10/17/19 Yes [provider]  albuterol (PROVENTIL HFA;VENTOLIN HFA) 108 (90 Base) MCG/ACT inhaler Inhale 2 puffs into the lungs every 6 (six) hours as needed for wheezing or shortness of breath. 06/14/17    Mosie Lukes, MD  budesonide-formoterol (SYMBICORT) 80-4.5 MCG/ACT inhaler Inhale 2 puffs into the lungs in the morning and at bedtime. 07/09/19   Mosie Lukes, MD  cetirizine (ZYRTEC) 10 MG tablet Take 1 tablet (10 mg total) by mouth daily. 08/27/19   Debbrah Alar, NP  esomeprazole (NEXIUM) 40 MG capsule Take 1 capsule (40 mg total) by mouth daily before breakfast. 03/16/19   Gatha Mayer, MD    Physical Exam: Vitals:   09/27/19 1445  BP: (!) 121/56  Pulse: 91  Resp: 20  Temp: 99.6 F (37.6 C)  TempSrc: Oral  SpO2: 96%    Constitutional: NAD, calm, comfortable Vitals:   09/27/19 1445  BP: (!) 121/56  Pulse: 91  Resp: 20  Temp: 99.6 F (37.6 C)  TempSrc: Oral  SpO2: 96%   Eyes: PERRL, lids and conjunctivae normal ENMT: Mucous membranes are moist. Posterior pharynx clear of any exudate or lesions.Normal dentition.  Neck: normal, supple, no masses, no thyromegaly Respiratory: clear to auscultation bilaterally, no wheezing, no crackles. Normal respiratory effort. No accessory muscle use.  Cardiovascular: Regular rate and rhythm, no murmurs / rubs / gallops. No extremity edema. 2+ pedal pulses. No carotid bruits.  Abdomen: no tenderness, no masses palpated. No hepatosplenomegaly. Bowel sounds positive.  Musculoskeletal: no clubbing / cyanosis. No joint deformity upper and lower extremities. Good ROM, no contractures. Normal muscle tone.  Skin: no rashes, lesions, ulcers. No induration Neurologic: Left-sided facial droop with tongue deviation, weakness of the left forearm and left hand compared to the right side.  Coordination is also slow on the left arm.  Muscle strength on bilateral legs 5/5.  Light touch sensation decreased on the left forearm and hand compared to right side Psychiatric: Normal judgment and insight. Alert and oriented x 3. Normal mood.     Labs on Admission: I have personally reviewed following labs and imaging studies  CBC: Recent Labs  Lab  09/27/19 1423 09/27/19 1424  WBC 11.6*  --   NEUTROABS 9.4*  --   HGB 12.3 13.3  HCT 39.5 39.0  MCV 83.5  --   PLT 412*  --    Basic Metabolic Panel: Recent Labs  Lab 09/27/19 1424  NA 135  K 4.1  CL 99  GLUCOSE 89  BUN 6*  CREATININE 0.70   GFR: Estimated Creatinine Clearance: 50.1 mL/min (by C-G formula based on SCr of 0.7 mg/dL). Liver Function Tests: No results for input(s): AST, ALT, ALKPHOS, BILITOT, PROT, ALBUMIN in the last 168 hours. No results for input(s): LIPASE, AMYLASE in the last 168 hours. No results for input(s): AMMONIA in the last 168 hours. Coagulation Profile: Recent Labs  Lab 09/27/19 1423  INR 1.0   Cardiac Enzymes: No results for input(s): CKTOTAL, CKMB, CKMBINDEX, TROPONINI in the last 168 hours. BNP (last 3 results) No results for input(s): PROBNP  in the last 8760 hours. HbA1C: No results for input(s): HGBA1C in the last 72 hours. CBG: No results for input(s): GLUCAP in the last 168 hours. Lipid Profile: No results for input(s): CHOL, HDL, LDLCALC, TRIG, CHOLHDL, LDLDIRECT in the last 72 hours. Thyroid Function Tests: No results for input(s): TSH, T4TOTAL, FREET4, T3FREE, THYROIDAB in the last 72 hours. Anemia Panel: No results for input(s): VITAMINB12, FOLATE, FERRITIN, TIBC, IRON, RETICCTPCT in the last 72 hours. Urine analysis:    Component Value Date/Time   COLORURINE YELLOW 09/18/2019 1626   APPEARANCEUR Cloudy (A) 09/18/2019 1626   LABSPEC 1.020 09/18/2019 1626   PHURINE 6.0 09/18/2019 1626   GLUCOSEU NEGATIVE 09/18/2019 1626   HGBUR NEGATIVE 09/18/2019 1626   BILIRUBINUR NEGATIVE 09/18/2019 1626   BILIRUBINUR negative 05/15/2019 1105   KETONESUR TRACE (A) 09/18/2019 1626   PROTEINUR Negative 05/15/2019 1105   UROBILINOGEN 0.2 09/18/2019 1626   NITRITE NEGATIVE 09/18/2019 1626   LEUKOCYTESUR NEGATIVE 09/18/2019 1626    Radiological Exams on Admission: CT Code Stroke CTA Head W/WO contrast  Result Date:  09/27/2019 CLINICAL DATA:  Left-sided weakness EXAM: CT ANGIOGRAPHY HEAD AND NECK TECHNIQUE: Multidetector CT imaging of the head and neck was performed using the standard protocol during bolus administration of intravenous contrast. Multiplanar CT image reconstructions and MIPs were obtained to evaluate the vascular anatomy. Carotid stenosis measurements (when applicable) are obtained utilizing NASCET criteria, using the distal internal carotid diameter as the denominator. CONTRAST:  59m OMNIPAQUE IOHEXOL 350 MG/ML SOLN COMPARISON:  None. FINDINGS: CTA NECK Aortic arch: Mild plaque along the aortic arch. Great vessel origins are patent. There is eccentric primarily noncalcified plaque at the left subclavian origin causing approximately 60% stenosis. Ulcerated plaque is also noted. Right carotid system: Patent. Minimal calcified plaque at the ICA origin without measurable stenosis. Left carotid system: Patent. Mild calcified plaque at the ICA origin without measurable stenosis. Vertebral arteries: Patent.  Codominant.  No measurable stenosis. Skeleton: Degenerative changes of the cervical spine. Other neck: No mass or adenopathy. Upper chest: No apical lung mass. Review of the MIP images confirms the above findings CTA HEAD Anterior circulation: Intracranial internal carotid arteries are patent. Anterior and middle cerebral arteries are patent. Posterior circulation: Intracranial vertebral arteries and PICA origins are patent. Basilar artery is patent. Superior cerebellar artery origins are patent. Patent posterior cerebral arteries. Venous sinuses: Patent as allowed by contrast bolus timing. Review of the MIP images confirms the above findings IMPRESSION: No large vessel occlusion. 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 Plaque ulceration is also noted. Electronically Signed   By: PMacy MisM.D.   On: 09/27/2019 14:52   CT Code Stroke CTA Neck W/WO  contrast  Result Date: 09/27/2019 CLINICAL DATA:  Left-sided weakness EXAM: CT ANGIOGRAPHY HEAD AND NECK TECHNIQUE: Multidetector CT imaging of the head and neck was performed using the standard protocol during bolus administration of intravenous contrast. Multiplanar CT image reconstructions and MIPs were obtained to evaluate the vascular anatomy. Carotid stenosis measurements (when applicable) are obtained utilizing NASCET criteria, using the distal internal carotid diameter as the denominator. CONTRAST:  55mOMNIPAQUE IOHEXOL 350 MG/ML SOLN COMPARISON:  None. FINDINGS: CTA NECK Aortic arch: Mild plaque along the aortic arch. Great vessel origins are patent. There is eccentric primarily noncalcified plaque at the left subclavian origin causing approximately 60% stenosis. Ulcerated plaque is also noted. Right carotid system: Patent. Minimal calcified plaque at the ICA origin without measurable stenosis. Left carotid system: Patent. Mild  calcified plaque at the ICA origin without measurable stenosis. Vertebral arteries: Patent.  Codominant.  No measurable stenosis. Skeleton: Degenerative changes of the cervical spine. Other neck: No mass or adenopathy. Upper chest: No apical lung mass. Review of the MIP images confirms the above findings CTA HEAD Anterior circulation: Intracranial internal carotid arteries are patent. Anterior and middle cerebral arteries are patent. Posterior circulation: Intracranial vertebral arteries and PICA origins are patent. Basilar artery is patent. Superior cerebellar artery origins are patent. Patent posterior cerebral arteries. Venous sinuses: Patent as allowed by contrast bolus timing. Review of the MIP images confirms the above findings IMPRESSION: No large vessel occlusion. 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 Plaque ulceration is also noted. Electronically Signed   By: Macy Mis M.D.   On: 09/27/2019 14:52   CT HEAD  CODE STROKE WO CONTRAST  Result Date: 09/27/2019 CLINICAL DATA:  Code stroke.  Left-sided weakness EXAM: CT HEAD WITHOUT CONTRAST TECHNIQUE: Contiguous axial images were obtained from the base of the skull through the vertex without intravenous contrast. COMPARISON:  CT head 09/17/2019 FINDINGS: Brain: No acute intracranial hemorrhage, mass effect, or edema. Gray-white differentiation remains preserved. Ventricles are stable in size. No extra-axial fluid collection. Vascular: No hyperdense vessel. Skull: Unremarkable. Sinuses/Orbits: No acute abnormality. Other: Mastoid air cells are clear. ASPECTS ([place] Stroke Program Early CT Score) - Ganglionic level infarction (caudate, lentiform nuclei, internal capsule, insula, M1-M3 cortex): 7 - Supraganglionic infarction (M4-M6 cortex): 3 Total score (0-10 with 10 being normal): 10 IMPRESSION: No acute intracranial hemorrhage or evidence acute infarction. ASPECT score is 10. These results were communicated to Dr. Cheral Marker at 2:32 pmon 7/29/2021by text page via the [facility] messaging system. Electronically Signed   By: Macy Mis M.D.   On: 09/27/2019 14:36    EKG: Independently reviewed.  Sinus rhythm  Assessment/Plan Active Problems:   * No active hospital problems. *  (please populate well all problems here in Problem List. (For example, if patient is on BP meds at home and you resume or decide to hold them, it is a problem that needs to be her. Same for CAD, COPD, HLD and so on)  Acute on subacute CVA -Evolving of symptoms with worsening of left-sided weakness and a new left-sided facial droop and brief speech difficulty in the morning.  Agreed with repeat MRI as per neurologist.  Given no significant bleeding finding on CT scan, will add Plavix on top of aspirin and continue high intensity statin. ([facility] record: MRI on 07/17, right posterior frontal lobe and anterior right parietal lobe subacute cortical infarct.  LDL 128, HDL 48.  Echocardiogram,  LVEF 58%, CTA subtle cortical vascular enhancement within the right frontal convexity corresponding to MRI finding potentially representing a subacute infarct post reperfusion.  70% stenosis of left subclavian artery origin.  HbA1c 6.0.) -PT and speech evaluation  Hypocalcemia -P.o. replacement and check vitamin D level  Headache -Location seems overlapping the stroke area, but will send ESR.  Left subclavian artery stenosis -Patient denied any lightheadedness vision changes or chest pains -Check BP on both arms -Outpt vascular f/u  HLD -Check lipid panel, continue statin  DVT prophylaxis: Lovenox Code Status: Full code Family Communication: Sister present Disposition Plan: Probably can discharge in 1 to 2 days after stroke done and PT evaluation Consults called: Neurology Admission status: Tele admit   Lequita Halt MD [facility] Pager [phone]  09/27/2019, 3:16 PM

## 2019-09-27 NOTE — ED Triage Notes (Signed)
Pt came from home, once pt noticed she had a facial droop with left sided weakness she called her sister and then 911. EMS reports pt had a TIA 2 weeks ago while visiting family in [place]. Upon arrival to ED pt remains ataxic & weak on the left side, NIH 3, A/Ox4, verbal-speech slurred, able to make needs known.

## 2019-09-27 NOTE — Progress Notes (Signed)
  Echocardiogram 2D Echocardiogram has been performed.  Danielle Fox 09/27/2019, 4:36 PM

## 2019-09-27 NOTE — Telephone Encounter (Signed)
Pt called still having symptoms she  Was advised to go to the ER pt stated she would call 911

## 2019-09-27 NOTE — Consult Note (Addendum)
NEURO HOSPITALIST  CONSULT   Requesting Physician: Dr. Erenest Blank    Chief Complaint: Left sided weakness, left facial droop  History obtained from:  Patient  / chart review HPI:                                                                                                                                         Danielle Fox is an 69 y.o. female  With PMH CVA (7/21),COPD who presented to [facility] ED as a code stroke for left sided weakness and facial droop.  Patient was in [place] 2 weeks ago and had similar symptoms. She was diagnosed with a stroke at that time and placed on ASA twice daily as well as a statin. A 30 day heart monitor was also suggested.  Today, the patient woke up at 5 AM with left sided weakness and facial droop that resolved in 4 hours. The weakness occurred again at 9 AM, resolved completely and then recurred at 1300. Per patient this was a worsening of her residual left sided weakness. She still had not returned to full function following her stroke 2 weeks ago. Denies ETOH, cigarettes, drug use or missing any doses of ASA except for this morning's dose.  ED course:  CTH: no hemorrhage CTA: no LVO BP 115/56 BG: 89    On 09/15/19 patient was seen at [facility] [place] in [place] for left arm weakness/incoordination.  CTA and MRI results below. 09/15/19 [facility] [place]: CTA IMPRESSION:  1. Subtle cortical vascular enhancement within the right frontal convexity  corresponds to MR findings, potentially representing a subacute infarct  status post reperfusion.  2. No significant vessel narrowing within the head.  3. 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   MRI 09/15/19:  1. Cortical FLAIR hyperintensity and subtle enhancement involve the right  posterior frontal lobe and the anterior right parietal lobe, without  restricted diffusion. This pattern favors subacute cortical  infarcts.   Neurology note from [facility] "Neurological Examination:  The patient is awake, alert, and oriented x3. Attention span and concentration are normal. The patient's speech is fluent and language is intact. Visual fields are full to finger confrontation. Extraocular movements are normal. There is no nystagmus. Nasolabial folds and facial sensation are intact and symmetric. Tongue and uvula are midline. Palate moves symmetrically. High frequency hearing is intact bilaterally. There is no pronator drift. Muscle bulk and tone are normal in all extremities. Strength is normal in all extremities. Sensory examination to cold temperature is intact and symmetric in all extremities. Joint position and vibration perception are intact in feet. Finger-to-nose examination did not reveal  any tremors or dysmetria. Deep tendon reflexes are 2 in both upper and lower extremities. Gait examination is unremarkable. Romberg is negative."  Assessment/Plan:  Danielle Fox is a pleasant 69 year old female with a pmhx of tobacco use who presents to the ED with complaints of LUE coordination. Neurology was consulted for further evaluation.  1. Subacute R frontal infarct -reviewed imaging from MRI. Imaging is not typical for a subacute infarct on personal review. No findings suggestive of CVST or SAH. Discussed imaging with radiology- subacute infarct most likely  - Pt denies any history of recent illnesses, fever, headaches, sick contacts. No history of seizure or family hx of seizure. Discussed further workup with EEG and spinal tap however pt declines and wants to go home. -reviewed CTA imaging. No large vessel occlusion, significant intracranial stenosis or aneurysm. 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  -LDL (128), A1C (6.0), echo EF (58%) -continue aspirin and atorvastatin -recommend follow up MRI brain w/wo in 4 weeks to ensure stability -recommend 30 day event monitor to rule out underlying  atrial fibrillation   Thank you for the opportunity of participating in the care of this nice patient.  tPA Given: no; recent stroke Modified Rankin: Rankin Score=1 NIHSS:3   Past Medical History:  Diagnosis Date  . Allergy   . Clostridioides difficile infection 10/19/2018   tested 10/2018 negative  . Colon polyps   . Constipation 10/06/2014  . COPD (chronic obstructive pulmonary disease) ([place])   . Osteopenia   . 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 Preventative health care 06/29/2016  . Pruritus 06/29/2016  . Welcome to Medicare preventive visit 06/29/2016    Past Surgical History:  Procedure Laterality Date  . APPENDECTOMY    . COLONOSCOPY     01-14-2005  . SINUS IRRIGATION  04/02/2015  . UPPER GASTROINTESTINAL ENDOSCOPY  11/22/2018    Family History  Problem Relation Age of Onset  . Heart disease Mother        cabg, MI  . Alcohol abuse Father   . Cirrhosis Father   . Heart disease Father        MI  . Colon cancer Neg Hx   . Colon polyps Neg Hx   . Esophageal cancer Neg Hx   . Stomach cancer Neg Hx   . Rectal cancer Neg Hx          Social History:  reports that she quit smoking about 5 years ago. Her smoking use included cigarettes. She has a 48.00 pack-year smoking history. She has never used smokeless tobacco. She reports current alcohol use. She reports that she does not use drugs.  Allergies:  Allergies  Allergen Reactions  . Doxycycline Nausea And Vomiting  . Erythromycin Base     Other reaction(s): GI Upset (intolerance)    Medications:  No current facility-administered medications for this encounter.   Current Outpatient Medications  Medication Sig Dispense Refill  . albuterol (PROVENTIL HFA;VENTOLIN HFA) 108 (90 Base) MCG/ACT inhaler Inhale 2 puffs into the lungs every 6 (six) hours as needed for wheezing or shortness of breath. 1  Inhaler 2  . aspirin 81 MG EC tablet Take 1 tablet (81 mg total) by mouth in the morning and at bedtime. 30 tablet   . atorvastatin (LIPITOR) 40 MG tablet Take 1 tablet by mouth at bedtime.    . budesonide-formoterol (SYMBICORT) 80-4.5 MCG/ACT inhaler Inhale 2 puffs into the lungs in the morning and at bedtime. 30.6 g 1  . cetirizine (ZYRTEC) 10 MG tablet Take 1 tablet (10 mg total) by mouth daily. 30 tablet 11  . esomeprazole (NEXIUM) 40 MG capsule Take 1 capsule (40 mg total) by mouth daily before breakfast. 90 capsule 3   '  ROS:                                                                                                                                       ROS was performed and is negative except as noted in HPI    General Examination:                                                                                                      There were no vitals taken for this visit.  Physical Exam  Constitutional: Appears well-developed and well-nourished.  Psych: Affect appropriate to situation Eyes: Normal external eye and conjunctiva. HENT: Normocephalic, no lesions, without obvious abnormality.   Musculoskeletal-no joint tenderness, deformity or swelling Cardiovascular: Normal rate and regular rhythm.  Respiratory: Effort normal, non-labored breathing saturations WNL GI: Soft.  No distension. There is no tenderness.  Skin: WDI  Neurological Examination Mental Status: Alert, oriented, thought content appropriate. Mild dysarthria. Speech fluent without evidence of aphasia. Naming intact. Able to follow all commands without difficulty. Cranial Nerves: II:  Visual fields grossly normal. PERRL.   III,IV, VI: Ptosis not present, EOMI V,VII: Smile asymmetric, left facial droop, facial light touch sensation normal bilaterally VIII: Hearing intact to voice IX,X: No hypophonia XI: Symmetric shoulder shrug XII: Midline tongue extension Motor: Right : Upper extremity   5/5  Left:      Upper extremity   4-/5  Lower extremity   5/5   Lower extremity   5/5 Tone and bulk:normal tone throughout; no atrophy noted Sensory: cool temp/ light touch intact throughout, bilaterally Deep  Tendon Reflexes: 2+ and symmetric biceps and patellae Plantars: Right: downgoing   Left: downgoing Cerebellar:   Ataxia with FNF on the left HTS normal Gait: Deferred   Lab Results: Basic Metabolic Panel: Recent Labs  Lab 09/27/19 1424  NA 135  K 4.1  CL 99  GLUCOSE 89  BUN 6*  CREATININE 0.70    CBC: Recent Labs  Lab 09/27/19 1423 09/27/19 1424  WBC 11.6*  --   NEUTROABS 9.4*  --   HGB 12.3 13.3  HCT 39.5 39.0  MCV 83.5  --   PLT 412*  --     Imaging: CT HEAD CODE STROKE WO CONTRAST  Result Date: 09/27/2019 CLINICAL DATA:  Code stroke.  Left-sided weakness EXAM: CT HEAD WITHOUT CONTRAST TECHNIQUE: Contiguous axial images were obtained from the base of the skull through the vertex without intravenous contrast. COMPARISON:  CT head 09/17/2019 FINDINGS: Brain: No acute intracranial hemorrhage, mass effect, or edema. Gray-white differentiation remains preserved. Ventricles are stable in size. No extra-axial fluid collection. Vascular: No hyperdense vessel. Skull: Unremarkable. Sinuses/Orbits: No acute abnormality. Other: Mastoid air cells are clear. ASPECTS (Ingram Stroke Program Early CT Score) - Ganglionic level infarction (caudate, lentiform nuclei, internal capsule, insula, M1-M3 cortex): 7 - Supraganglionic infarction (M4-M6 cortex): 3 Total score (0-10 with 10 being normal): 10 IMPRESSION: No acute intracranial hemorrhage or evidence acute infarction. ASPECT score is 10. These results were communicated to Dr. Cheral Marker at 2:32 pmon 7/29/2021by text page via the [facility] messaging system. Electronically Signed   By: Macy Mis M.D.   On: 09/27/2019 14:36    Laurey Morale, MSN, NP-C [facility] [phone] 09/27/2019, 2:31 PM    Assessment: 69 y.o. female with a  recent CVA (08/2019) who presented to the [facility] ED as a code stroke for left sided weakness and facial droop, worsened relative to her post-stroke baseline.  -- CTH was negative for hemorrhage.  -- CTA did not show any LVO.  -- Patient was not a candidate for tPA d/t recent stroke, and given that there was no LVO she was not a candidate for VIR. -- Will need admission for complete stroke work-up d/t concern for new stroke with worsening of her residual symptoms. -- Stroke Risk Factors - recent stroke    Recommendations: -- BP goal : Permissive HTN upto 220/110 mmHg (for 24-48 post admission ) -- MRI Brain  -- Echocardiogram -- Continue ASA. May need to switch to Plavix -- Continue statin -- HgbA1c, fasting lipid panel -- PT consult, OT consult, Speech consult -- Telemetry monitoring -- Frequent neuro checks -- Stroke swallow screen -- Please page the Stroke team from 8am-4pm.   You can look them up on [email or web address]  I have seen and examined the patient. I have formulated the assessment and recommendations. My examination findings were observed and documented by Laurey Morale, NP. Electronically signed: Dr. Kerney Elbe

## 2019-09-27 NOTE — ED Notes (Signed)
Patient transported to MRI 

## 2019-09-27 NOTE — Telephone Encounter (Signed)
Patient called in wanting to try to see Dr. Tomi Fox sooner. She is scheduled for 10/02/19. I don't see any cancellations or work in slots. She said she woke up this morning with the side of her face and arm drooping. She isn't able to move her arm. I also told her to reach out to her PCP as well.

## 2019-09-27 NOTE — ED Provider Notes (Signed)
[facility] EMERGENCY DEPARTMENT Provider Note   CSN: [id number] Arrival date & time: 09/27/19  1413  An emergency department physician performed an initial assessment on this suspected stroke patient at 1415.  History Chief Complaint  Patient presents with  . Code Stroke    Corneshia JENTRI AYE is a 69 y.o. female.  Stroke alert  History limited due to acuity.  Additional history obtained via chart review.  Patient woke up with left-sided arm weakness, face drooping.  Similar symptoms on recent admission in [place] for stroke.  Denies any other new symptoms.  During hospital stay she had great improvement in symptoms.    Per PCP note: Patient has just returned yesterday from [place]. While she was there she had a CVA and presented to [facility], [place], [place] [phone]. We do not have access to the records but she reports MRI confirmed a CVA leading to weakness and numbness in her left arm. She was in the hospital for several days and showed marked improvement she notes also having some trouble with word finding as well. She was feeling notably better but while on her way back here she suffered a very brief episode of her numbness above her upper lip and a spasm in her left hand. Only seconds in length. She presented to the ER but left before her work up was done. Her CT scan was not remarkable for any new lesions. We have requested the MRI and work up from [place]. New MRI is ordered to evaluate the new episode and referral to neurology was made. She is asked to increase her ECASA 81 mg to bid. Seek care if symptoms worsen.  HPI     Past Medical History:  Diagnosis Date  . Allergy   . Clostridioides difficile infection 10/19/2018   tested 10/2018 negative  . Colon polyps   . Constipation 10/06/2014  . COPD (chronic obstructive pulmonary disease) ([place])   . Osteopenia   . 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 Preventative health care 06/29/2016  .  Pruritus 06/29/2016  . Welcome to Medicare preventive visit 06/29/2016    Patient Active Problem List   Diagnosis Date Noted  . H/O: CVA (cerebrovascular accident) 09/19/2019  . Chills 09/19/2019  . Hypokalemia 09/19/2019  . Clostridioides difficile infection   . Diarrhea 09/24/2018  . Vitamin D deficiency 01/02/2017  . Welcome to Medicare preventive visit 06/29/2016  . Pruritus 06/29/2016  . Abnormal CT scan 08/14/2015  . Constipation 10/06/2014  . 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 Rib pain on left side 09/08/2014  . Chronic cough 06/14/2014  . SOB (shortness of breath) 06/14/2014  . Cough 05/20/2014  . Hyperlipidemia, mild 08/15/2011  . Osteopenia 03/01/2011  . History of smoking 03/01/2011  . Musculoskeletal pain 03/01/2011    Past Surgical History:  Procedure Laterality Date  . APPENDECTOMY    . COLONOSCOPY     01-14-2005  . SINUS IRRIGATION  04/02/2015  . UPPER GASTROINTESTINAL ENDOSCOPY  11/22/2018     OB History   No obstetric history on file.     Family History  Problem Relation Age of Onset  . Heart disease Mother        cabg, MI  . Alcohol abuse Father   . Cirrhosis Father   . Heart disease Father        MI  . Colon cancer Neg Hx   . Colon polyps Neg Hx   . Esophageal  cancer Neg Hx   . Stomach cancer Neg Hx   . Rectal cancer Neg Hx     Social History   Tobacco Use  . Smoking status: Former Smoker    Packs/day: 1.00    Years: 48.00    Pack years: 48.00    Types: Cigarettes    Quit date: 02/19/2014    Years since quitting: 5.6  . Smokeless tobacco: Never Used  . Tobacco comment: Quit and has no desire to smoke again,  Vaping Use  . Vaping Use: Never used  Substance Use Topics  . Alcohol use: Yes    Alcohol/week: 0.0 standard drinks    Comment: socially  . Drug use: No    Home Medications Prior to Admission medications   Medication Sig Start Date End Date Taking? Authorizing Provider  aspirin 81 MG EC tablet Take 1 tablet (81 mg total) by  mouth in the morning and at bedtime. 09/18/19 10/19/19 Yes Mosie Lukes, MD  atorvastatin (LIPITOR) 40 MG tablet Take 1 tablet by mouth at bedtime. 09/16/19 10/17/19 Yes [provider]  albuterol (PROVENTIL HFA;VENTOLIN HFA) 108 (90 Base) MCG/ACT inhaler Inhale 2 puffs into the lungs every 6 (six) hours as needed for wheezing or shortness of breath. 06/14/17   Mosie Lukes, MD  budesonide-formoterol (SYMBICORT) 80-4.5 MCG/ACT inhaler Inhale 2 puffs into the lungs in the morning and at bedtime. 07/09/19   Mosie Lukes, MD  cetirizine (ZYRTEC) 10 MG tablet Take 1 tablet (10 mg total) by mouth daily. 08/27/19   Debbrah Alar, NP  esomeprazole (NEXIUM) 40 MG capsule Take 1 capsule (40 mg total) by mouth daily before breakfast. 03/16/19   Gatha Mayer, MD    Allergies    Doxycycline and Erythromycin base  Review of Systems   Review of Systems  Unable to perform ROS: Acuity of condition    Physical Exam Updated Vital Signs BP (!) 121/56 (BP Location: Right Arm)   Pulse 91   Temp 99.6 F (37.6 C) (Oral)   Resp 20   SpO2 96%   Physical Exam Vitals and nursing note reviewed.  Constitutional:      General: She is not in acute distress.    Appearance: She is well-developed.  HENT:     Head: Normocephalic and atraumatic.  Eyes:     Conjunctiva/sclera: Conjunctivae normal.  Cardiovascular:     Rate and Rhythm: Normal rate and regular rhythm.     Heart sounds: No murmur heard.   Pulmonary:     Effort: Pulmonary effort is normal. No respiratory distress.     Breath sounds: Normal breath sounds.  Abdominal:     Palpations: Abdomen is soft.     Tenderness: There is no abdominal tenderness.  Musculoskeletal:     Cervical back: Neck supple.  Skin:    General: Skin is warm and dry.  Neurological:     Mental Status: She is alert.     Comments: AAOX3 CN II-XII intact except left facial droop 4/5 in LUE, 5/5 in LLE 5/5 RUE, 5/5 RLE     ED Results / Procedures /  Treatments   Labs (all labs ordered are listed, but only abnormal results are displayed) Labs Reviewed  CBC - Abnormal; Notable for the following components:      Result Value   WBC 11.6 (*)    Platelets 412 (*)    All other components within normal limits  DIFFERENTIAL - Abnormal; Notable for the following components:  Neutro Abs 9.4 (*)    Monocytes Absolute 1.1 (*)    All other components within normal limits  I-STAT CHEM 8, ED - Abnormal; Notable for the following components:   BUN 6 (*)    Calcium, Ion 1.12 (*)    All other components within normal limits  ETHANOL  PROTIME-INR  APTT  COMPREHENSIVE METABOLIC PANEL  RAPID URINE DRUG SCREEN, HOSP PERFORMED  URINALYSIS, ROUTINE W REFLEX MICROSCOPIC    EKG EKG Interpretation  Date/Time:  Thursday September 27 2019 14:44:28 EDT Ventricular Rate:  91 PR Interval:    QRS Duration: 84 QT Interval:  359 QTC Calculation: 442 R Axis:   53 Text Interpretation: Sinus rhythm Low voltage, precordial leads Confirmed by Madalyn Rob [phone]) on 09/27/2019 3:05:41 PM   Radiology CT Code Stroke CTA Head W/WO contrast  Result Date: 09/27/2019 CLINICAL DATA:  Left-sided weakness EXAM: CT ANGIOGRAPHY HEAD AND NECK TECHNIQUE: Multidetector CT imaging of the head and neck was performed using the standard protocol during bolus administration of intravenous contrast. Multiplanar CT image reconstructions and MIPs were obtained to evaluate the vascular anatomy. Carotid stenosis measurements (when applicable) are obtained utilizing NASCET criteria, using the distal internal carotid diameter as the denominator. CONTRAST:  33mL OMNIPAQUE IOHEXOL 350 MG/ML SOLN COMPARISON:  None. FINDINGS: CTA NECK Aortic arch: Mild plaque along the aortic arch. Great vessel origins are patent. There is eccentric primarily noncalcified plaque at the left subclavian origin causing approximately 60% stenosis. Ulcerated plaque is also noted. Right carotid system: Patent.  Minimal calcified plaque at the ICA origin without measurable stenosis. Left carotid system: Patent. Mild calcified plaque at the ICA origin without measurable stenosis. Vertebral arteries: Patent.  Codominant.  No measurable stenosis. Skeleton: Degenerative changes of the cervical spine. Other neck: No mass or adenopathy. Upper chest: No apical lung mass. Review of the MIP images confirms the above findings CTA HEAD Anterior circulation: Intracranial internal carotid arteries are patent. Anterior and middle cerebral arteries are patent. Posterior circulation: Intracranial vertebral arteries and PICA origins are patent. Basilar artery is patent. Superior cerebellar artery origins are patent. Patent posterior cerebral arteries. Venous sinuses: Patent as allowed by contrast bolus timing. Review of the MIP images confirms the above findings IMPRESSION: No large vessel occlusion. 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 Plaque ulceration is also noted. Electronically Signed   By: Macy Mis M.D.   On: 09/27/2019 14:52   CT Code Stroke CTA Neck W/WO contrast  Result Date: 09/27/2019 CLINICAL DATA:  Left-sided weakness EXAM: CT ANGIOGRAPHY HEAD AND NECK TECHNIQUE: Multidetector CT imaging of the head and neck was performed using the standard protocol during bolus administration of intravenous contrast. Multiplanar CT image reconstructions and MIPs were obtained to evaluate the vascular anatomy. Carotid stenosis measurements (when applicable) are obtained utilizing NASCET criteria, using the distal internal carotid diameter as the denominator. CONTRAST:  45mL OMNIPAQUE IOHEXOL 350 MG/ML SOLN COMPARISON:  None. FINDINGS: CTA NECK Aortic arch: Mild plaque along the aortic arch. Great vessel origins are patent. There is eccentric primarily noncalcified plaque at the left subclavian origin causing approximately 60% stenosis. Ulcerated plaque is also noted. Right  carotid system: Patent. Minimal calcified plaque at the ICA origin without measurable stenosis. Left carotid system: Patent. Mild calcified plaque at the ICA origin without measurable stenosis. Vertebral arteries: Patent.  Codominant.  No measurable stenosis. Skeleton: Degenerative changes of the cervical spine. Other neck: No mass or adenopathy. Upper chest: No apical lung mass. Review  of the MIP images confirms the above findings CTA HEAD Anterior circulation: Intracranial internal carotid arteries are patent. Anterior and middle cerebral arteries are patent. Posterior circulation: Intracranial vertebral arteries and PICA origins are patent. Basilar artery is patent. Superior cerebellar artery origins are patent. Patent posterior cerebral arteries. Venous sinuses: Patent as allowed by contrast bolus timing. Review of the MIP images confirms the above findings IMPRESSION: No large vessel occlusion. 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 Plaque ulceration is also noted. Electronically Signed   By: Macy Mis M.D.   On: 09/27/2019 14:52   CT HEAD CODE STROKE WO CONTRAST  Result Date: 09/27/2019 CLINICAL DATA:  Code stroke.  Left-sided weakness EXAM: CT HEAD WITHOUT CONTRAST TECHNIQUE: Contiguous axial images were obtained from the base of the skull through the vertex without intravenous contrast. COMPARISON:  CT head 09/17/2019 FINDINGS: Brain: No acute intracranial hemorrhage, mass effect, or edema. Gray-white differentiation remains preserved. Ventricles are stable in size. No extra-axial fluid collection. Vascular: No hyperdense vessel. Skull: Unremarkable. Sinuses/Orbits: No acute abnormality. Other: Mastoid air cells are clear. ASPECTS ([place] Stroke Program Early CT Score) - Ganglionic level infarction (caudate, lentiform nuclei, internal capsule, insula, M1-M3 cortex): 7 - Supraganglionic infarction (M4-M6 cortex): 3 Total score (0-10 with 10  being normal): 10 IMPRESSION: No acute intracranial hemorrhage or evidence acute infarction. ASPECT score is 10. These results were communicated to Dr. Cheral Marker at 2:32 pmon 7/29/2021by text page via the [facility] messaging system. Electronically Signed   By: Macy Mis M.D.   On: 09/27/2019 14:36    Procedures .Critical Care Performed by: Lucrezia Starch, MD Authorized by: Lucrezia Starch, MD   Critical care provider statement:    Critical care time (minutes):  33   Critical care was necessary to treat or prevent imminent or life-threatening deterioration of the following conditions:  CNS failure or compromise   Critical care was time spent personally by me on the following activities:  Discussions with consultants, evaluation of patient's response to treatment, examination of patient, ordering and performing treatments and interventions, ordering and review of laboratory studies, ordering and review of radiographic studies, pulse oximetry, re-evaluation of patient's condition, obtaining history from patient or surrogate and review of old charts   (including critical care time)  Medications Ordered in ED Medications  LORazepam (ATIVAN) tablet 0.5 mg (has no administration in time range)  iohexol (OMNIPAQUE) 350 MG/ML injection 50 mL (50 mLs Intravenous Contrast Given 09/27/19 1438)    ED Course  I have reviewed the triage vital signs and the nursing notes.  Pertinent labs & imaging results that were available during my care of the patient were reviewed by me and considered in my medical decision making (see chart for details).    MDM Rules/Calculators/A&P                          69 year old lady who presents to ER as a stroke alert.  Recent admission in [place] for ischemic stroke.  Symptoms today similar to past stroke symptoms. Per neuro, not TPA candidate. Neurology recommending admission for stroke work up.  Discussed with hospitalist who will admit.  Final Clinical  Impression(s) / ED Diagnoses Final diagnoses:  Cerebrovascular accident (CVA), unspecified mechanism ([place])    Rx / DC Orders ED Discharge Orders    None       Lucrezia Starch, MD 09/27/19 1525

## 2019-09-27 NOTE — Code Documentation (Signed)
Patient from home with symptoms of left weakness and facial droop that come and go. She was LKW at 1300 when she noticed returning left facial droop and worsening left weakness (from a previous stroke two weeks ago). She called her sister and then called 911. [facility] arrived and called a code stroke. BP 120/84. Pt arrived to [facility] and was taken to CT with stroke team, MD, and RN. CT and CTA complete. Pt is on ASA twice daily from the previous stroke. Her NIHSS is a 4 with left arm drift, droop, ataxia, and slurred speech. No TPA: contraindicated with previous recent stroke. LVO negative. Care Plan: vitals and neuro checks/mNIHSS q2hx12, then q4 hours. Hand off: Set designer. Shahzain Kiester, Rande Brunt, RN, SCRN

## 2019-09-28 ENCOUNTER — Inpatient Hospital Stay (HOSPITAL_COMMUNITY): Payer: Medicare Other

## 2019-09-28 DIAGNOSIS — R569 Unspecified convulsions: Secondary | ICD-10-CM

## 2019-09-28 DIAGNOSIS — G0490 Encephalitis and encephalomyelitis, unspecified: Secondary | ICD-10-CM

## 2019-09-28 DIAGNOSIS — R531 Weakness: Secondary | ICD-10-CM

## 2019-09-28 LAB — CSF CELL COUNT WITH DIFFERENTIAL
Eosinophils, CSF: 0 % (ref 0–1)
Lymphs, CSF: 55 % (ref 40–80)
Monocyte-Macrophage-Spinal Fluid: 26 % (ref 15–45)
RBC Count, CSF: 3 /mm3 — ABNORMAL HIGH
Segmented Neutrophils-CSF: 19 % — ABNORMAL HIGH (ref 0–6)
Tube #: 4
WBC, CSF: 43 /mm3 (ref 0–5)

## 2019-09-28 LAB — CRYPTOCOCCAL ANTIGEN, CSF: Crypto Ag: NEGATIVE

## 2019-09-28 LAB — LIPID PANEL
Cholesterol: 121 mg/dL (ref 0–200)
HDL: 47 mg/dL (ref >40)
LDL Cholesterol: 60 mg/dL (ref 0–99)
Total CHOL/HDL Ratio: 2.6 RATIO
Triglycerides: 70 mg/dL (ref <150)
VLDL: 14 mg/dL (ref 0–40)

## 2019-09-28 LAB — HEMOGLOBIN A1C
Hgb A1c MFr Bld: 5.9 % — ABNORMAL HIGH (ref 4.8–5.6)
Mean Plasma Glucose: 122.63 mg/dL

## 2019-09-28 LAB — ECHOCARDIOGRAM COMPLETE
Area-P 1/2: 3.72 cm2
S' Lateral: 2.3 cm

## 2019-09-28 LAB — GLUCOSE, CSF: Glucose, CSF: 42 mg/dL (ref 40–70)

## 2019-09-28 LAB — PROTEIN, CSF: Total  Protein, CSF: 50 mg/dL — ABNORMAL HIGH (ref 15–45)

## 2019-09-28 MED ORDER — ONDANSETRON HCL 4 MG/2ML IJ SOLN
4.0000 mg | Freq: Four times a day (QID) | INTRAMUSCULAR | Status: DC | PRN
  Administered 2019-09-28: 4 mg via INTRAVENOUS
  Filled 2019-09-28: qty 2

## 2019-09-28 MED ORDER — FENTANYL CITRATE (PF) 100 MCG/2ML IJ SOLN
12.5000 ug | Freq: Once | INTRAMUSCULAR | Status: AC
  Administered 2019-09-28: 12.5 ug via INTRAVENOUS

## 2019-09-28 MED ORDER — FENTANYL CITRATE (PF) 100 MCG/2ML IJ SOLN
INTRAMUSCULAR | Status: AC
  Filled 2019-09-28: qty 2

## 2019-09-28 MED ORDER — ENSURE ENLIVE PO LIQD: 237.0000 mL | Freq: Two times a day (BID) | ORAL | Status: DC

## 2019-09-28 MED ORDER — LIDOCAINE HCL (PF) 1 % IJ SOLN: 5.0000 mL | Freq: Once | INTRAMUSCULAR | Status: AC

## 2019-09-28 MED ORDER — LIDOCAINE HCL (PF) 1 % IJ SOLN
INTRAMUSCULAR | Status: AC
  Administered 2019-09-28: 5 mL
  Filled 2019-09-28: qty 5

## 2019-09-28 NOTE — Procedures (Signed)
Lumbar Puncture Procedure Note  GRACELIN WEISBERG  [id number]  09-13-1950  Date:09/28/19  Time:5:26 PM   Provider Performing:Rufino Staup Posey Pronto   Procedure: Fluoroscopic guided lumbar puncture  Indication(s) Rule out meningitis     Consent Risks of the procedure as well as the alternatives and risks of each were explained to the patient and/or caregiver.  Consent for the procedure was obtained and is signed in the bedside chart  Anesthesia Topical only with 1% lidocaine    Time Out Verified patient identification, verified procedure, site/side was marked, verified correct patient position, special equipment/implants available, medications/allergies/relevant history reviewed, required imaging and test results available.   Sterile Technique Maximal sterile technique including sterile barrier drape, hand hygiene, sterile gown, sterile gloves, mask.   Procedure Description Using palpation, approximate location of L3-L4 space identified.   Lidocaine used to anesthetize skin and subcutaneous tissue overlying this area.  A 22g spinal needle was then used to access the subarachnoid space. 13 mL CSF obtained.  Complications/Tolerance None; patient tolerated the procedure well.   EBL Minimal   Specimen(s) CSF

## 2019-09-28 NOTE — Progress Notes (Signed)
Initial Nutrition Assessment  DOCUMENTATION CODES:   Not applicable  INTERVENTION:   Ensure Enlive po BID, each supplement provides 350 kcal and 20 grams of protein  Recommend supplementing Vitamin D  NUTRITION DIAGNOSIS:   Inadequate oral intake related to decreased appetite as evidenced by per patient/family report.   GOAL:   Patient will meet greater than or equal to 90% of their needs   MONITOR:   PO intake, Supplement acceptance, Weight trends, Labs  REASON FOR ASSESSMENT:   Malnutrition Screening Tool    ASSESSMENT:   69 yo female admitted with left arm weakness, worsening stroke like symptoms with possible acute on subacute CVA, rule out atypical seizures/todd's paralysis vs possible demyelinating disease. PMH includes COPD, HLD, pre-DM   RD working remotely.  No recorded po intake. Attempted to call pt via phone but no answer  No weight loss noted per weight encounters over the past few months. Current wt 52 kg; noted weight of 57 kg in March but pt weight 55 kg in Jan.   Vit D, 25-hydroxy: 18.27 (L)  Labs: reviewed Meds:calcium carbonate    Diet Order:   Diet Order            Diet Heart Room service appropriate? Yes; Fluid consistency: Thin  Diet effective now                 EDUCATION NEEDS:   Not appropriate for education at this time  Skin:  Skin Assessment: Reviewed RN Assessment  Last BM:  no documented BM  Height:   Ht Readings from Last 1 Encounters:  09/27/19 5\' 1"  (1.549 m)    Weight:   Wt Readings from Last 1 Encounters:  09/27/19 52 kg     BMI:  Body mass index is 21.66 kg/m.  Estimated Nutritional Needs:   Kcal:  1550-1750 kcals  Protein:  65-75 g  Fluid:  >/= 1.5   Kerman Passey MS, RDN, LDN, CNSC Registered Dietitian III Clinical Nutrition RD Pager and On-Call Pager Number Located in [place]

## 2019-09-28 NOTE — Progress Notes (Addendum)
Subjective: Left hand numbness and incoordination continue. Denies any confusion.  Objective: Current vital signs: BP (!) 110/61 (BP Location: Right Arm)   Pulse 80   Temp 98.2 F (36.8 C) (Oral)   Resp 18   Ht '5\' 1"'  (1.549 m)   Wt 52 kg   SpO2 98%   BMI 21.66 kg/m  Vital signs in last 24 hours: Temp:  [97.8 F (36.6 C)-99.6 F (37.6 C)] 98.2 F (36.8 C) (07/30 0332) Pulse Rate:  [78-98] 80 (07/30 0332) Resp:  [17-24] 18 (07/30 0332) BP: (96-126)/(52-82) 110/61 (07/30 0332) SpO2:  [95 %-99 %] 98 % (07/30 0823) Weight:  [52 kg] 52 kg (07/29 2329)  Intake/Output from previous day: No intake/output data recorded. Intake/Output this shift: No intake/output data recorded. Nutritional status:  Diet Order            Diet Heart Room service appropriate? Yes; Fluid consistency: Thin  Diet effective now                HEENT: [place]/AT Lungs: Respirations unlabored Ext: Warm and well perfused  Neurologic Exam: Ment: Awake and alert. Good insight. Oriented. Speech fluent without evidence for aphasia.  CN: Fixates and tracks normally. Face symmetric.  Motor: Left hand weakness Cerebellar: Left hand incoordination  Lab Results: Results for orders placed or performed during the hospital encounter of 09/27/19 (from the past 48 hour(s))  Ethanol     Status: None   Collection Time: 09/27/19  2:23 PM  Result Value Ref Range   Alcohol, Ethyl (B) <10 <10 mg/dL    Comment: (NOTE) Lowest detectable limit for serum alcohol is 10 mg/dL.  For medical purposes only. Performed at [facility], [place] [address], [place], [place] [address]   Protime-INR     Status: None   Collection Time: 09/27/19  2:23 PM  Result Value Ref Range   Prothrombin Time 13.1 11.4 - 15.2 seconds   INR 1.0 0.8 - 1.2    Comment: (NOTE) INR goal varies based on device and disease states. Performed at [facility], [place] [address], [place], [place] [address]   APTT     Status: None   Collection  Time: 09/27/19  2:23 PM  Result Value Ref Range   aPTT 34 24 - 36 seconds    Comment: Performed at [facility] [address], [place], [place] [address]  CBC     Status: Abnormal   Collection Time: 09/27/19  2:23 PM  Result Value Ref Range   WBC 11.6 (H) 4.0 - 10.5 K/uL   RBC 4.73 3.87 - 5.11 MIL/uL   Hemoglobin 12.3 12.0 - 15.0 g/dL   HCT 39.5 36 - 46 %   MCV 83.5 80.0 - 100.0 fL   MCH 26.0 26.0 - 34.0 pg   MCHC 31.1 30.0 - 36.0 g/dL   RDW 13.9 11.5 - 15.5 %   Platelets 412 (H) 150 - 400 K/uL   nRBC 0.0 0.0 - 0.2 %    Comment: Performed at [facility] [address], [place], [place] [address]  Differential     Status: Abnormal   Collection Time: 09/27/19  2:23 PM  Result Value Ref Range   Neutrophils Relative % 81 %   Neutro Abs 9.4 (H) 1.7 - 7.7 K/uL   Lymphocytes Relative 9 %   Lymphs Abs 1.0 0.7 - 4.0 K/uL   Monocytes Relative 9 %   Monocytes Absolute 1.1 (H) 0 -  1 K/uL   Eosinophils Relative 0 %   Eosinophils Absolute 0.0 0 - 0 K/uL   Basophils Relative 0 %   Basophils Absolute 0.1 0 - 0 K/uL   Immature Granulocytes 1 %   Abs Immature Granulocytes 0.06 0.00 - 0.07 K/uL    Comment: Performed at [facility] [address], [place], [place] [address]  Comprehensive metabolic panel     Status: Abnormal   Collection Time: 09/27/19  2:23 PM  Result Value Ref Range   Sodium 135 135 - 145 mmol/L   Potassium 4.2 3.5 - 5.1 mmol/L   Chloride 100 98 - 111 mmol/L   CO2 23 22 - 32 mmol/L   Glucose, Bld 90 70 - 99 mg/dL    Comment: Glucose reference range applies only to samples taken after fasting for at least 8 hours.   BUN 6 (L) 8 - 23 mg/dL   Creatinine, Ser 0.83 0.44 - 1.00 mg/dL   Calcium 9.4 8.9 - 10.3 mg/dL   Total Protein 7.1 6.5 - 8.1 g/dL   Albumin 3.3 (L) 3.5 - 5.0 g/dL   AST 16 15 - 41 U/L   ALT 15 0 - 44 U/L   Alkaline Phosphatase 79 38 - 126 U/L   Total Bilirubin 0.6 0.3 - 1.2 mg/dL   GFR calc non Af Amer >60 >60 mL/min   GFR calc Af  Amer >60 >60 mL/min   Anion gap 12 5 - 15    Comment: Performed at [facility], [place] [address] [place], [place] [address]  I-stat chem 8, ED     Status: Abnormal   Collection Time: 09/27/19  2:24 PM  Result Value Ref Range   Sodium 135 135 - 145 mmol/L   Potassium 4.1 3.5 - 5.1 mmol/L   Chloride 99 98 - 111 mmol/L   BUN 6 (L) 8 - 23 mg/dL   Creatinine, Ser 0.70 0.44 - 1.00 mg/dL   Glucose, Bld 89 70 - 99 mg/dL    Comment: Glucose reference range applies only to samples taken after fasting for at least 8 hours.   Calcium, Ion 1.12 (L) 1.15 - 1.40 mmol/L   TCO2 25 22 - 32 mmol/L   Hemoglobin 13.3 12.0 - 15.0 g/dL   HCT 39.0 36 - 46 %  HIV Antibody (routine testing w rflx)     Status: None   Collection Time: 09/27/19  4:44 PM  Result Value Ref Range   HIV Screen 4th Generation wRfx Non Reactive Non Reactive    Comment: Performed at [facility], [place] [address], [place], [place] [address]  VITAMIN D 25 Hydroxy (Vit-D Deficiency, Fractures)     Status: Abnormal   Collection Time: 09/27/19  4:44 PM  Result Value Ref Range   Vit D, 25-Hydroxy 18.27 (L) 30 - 100 ng/mL    Comment: (NOTE) Vitamin D deficiency has been defined by the Institute of Medicine  and an Endocrine Society practice guideline as a level of serum 25-OH  vitamin D less than 20 ng/mL (1,2). The Endocrine Society went on to  further define vitamin D insufficiency as a level between 21 and 29  ng/mL (2).  1. IOM (Institute of Medicine). 2010. Dietary reference intakes for  calcium and D. [place]: The [employer]. 2. Holick MF, Binkley , Bischoff-Ferrari HA, et al. Evaluation,  treatment, and prevention of vitamin D deficiency: an Endocrine  Society clinical practice guideline, JCEM. 2011 Jul; 96(7):  1911-30.  Performed at [facility], [place] [address], , [place] [address]   Sedimentation rate     Status: Abnormal   Collection Time: 09/27/19  4:44 PM  Result Value Ref  Range   Sed Rate 55 (H) 0 - 22 mm/hr    Comment: Performed at [facility] [address] [place], [place] [address]  Urine rapid drug screen (hosp performed)     Status: None   Collection Time: 09/27/19  6:00 PM  Result Value Ref Range   Opiates NONE DETECTED NONE DETECTED   Cocaine NONE DETECTED NONE DETECTED   Benzodiazepines NONE DETECTED NONE DETECTED   Amphetamines NONE DETECTED NONE DETECTED   Tetrahydrocannabinol NONE DETECTED NONE DETECTED   Barbiturates NONE DETECTED NONE DETECTED    Comment: (NOTE) DRUG SCREEN FOR MEDICAL PURPOSES ONLY.  IF CONFIRMATION IS NEEDED FOR ANY PURPOSE, NOTIFY LAB WITHIN 5 DAYS.  LOWEST DETECTABLE LIMITS FOR URINE DRUG SCREEN Drug Class                     Cutoff (ng/mL) Amphetamine and metabolites    1000 Barbiturate and metabolites    200 Benzodiazepine                 628 Tricyclics and metabolites     300 Opiates and metabolites        300 Cocaine and metabolites        300 THC                            50 Performed at [facility], [place] [address], [place], [place] [address]   Urinalysis, Routine w reflex microscopic     Status: Abnormal   Collection Time: 09/27/19  6:00 PM  Result Value Ref Range   Color, Urine YELLOW YELLOW   APPearance CLEAR CLEAR   Specific Gravity, Urine >1.046 (H) 1.005 - 1.030   pH 6.0 5.0 - 8.0   Glucose, UA NEGATIVE NEGATIVE mg/dL   Hgb urine dipstick NEGATIVE NEGATIVE   Bilirubin Urine NEGATIVE NEGATIVE   Ketones, ur 20 (A) NEGATIVE mg/dL   Protein, ur NEGATIVE NEGATIVE mg/dL   Nitrite NEGATIVE NEGATIVE   Leukocytes,Ua TRACE (A) NEGATIVE   RBC / HPF 0-5 0 - 5 RBC/hpf   WBC, UA 0-5 0 - 5 WBC/hpf   Bacteria, UA NONE SEEN NONE SEEN   Squamous Epithelial / LPF 0-5 0 - 5    Comment: Performed at [facility], [place] [address], [place], [place] [address]  SARS Coronavirus 2 by RT PCR (hospital order, performed in [facility] hospital lab) Nasopharyngeal Nasopharyngeal Swab     Status:  None   Collection Time: 09/27/19  8:11 PM   Specimen: Nasopharyngeal Swab  Result Value Ref Range   SARS Coronavirus 2 NEGATIVE NEGATIVE    Comment: (NOTE) SARS-CoV-2 target nucleic acids are NOT DETECTED.  The SARS-CoV-2 RNA is generally detectable in upper and lower respiratory specimens during the acute phase of infection. The lowest concentration of SARS-CoV-2 viral copies this assay can detect is 250 copies / mL. A negative result does not preclude SARS-CoV-2 infection and should not be used as the sole basis for treatment or other patient management decisions.  A negative result may occur with improper specimen collection / handling, submission of specimen other than nasopharyngeal swab, presence of viral mutation(s) within the areas targeted by this assay, and  inadequate number of viral copies (<250 copies / mL). A negative result must be combined with clinical observations, patient history, and epidemiological information.  Fact Sheet for Patients:   StrictlyIdeas.no  Fact Sheet for Healthcare Providers: BankingDealers.co.za  This test is not yet approved or  cleared by the [place] FDA and has been authorized for detection and/or diagnosis of SARS-CoV-2 by FDA under an Emergency Use Authorization (EUA).  This EUA will remain in effect (meaning this test can be used) for the duration of the COVID-19 declaration under Section 564(b)(1) of the Act, 21 U.S.C. section 360bbb-3(b)(1), unless the authorization is terminated or revoked sooner.  Performed at [facility], [place] [address] [place], [place] [address]   Hemoglobin A1c     Status: Abnormal   Collection Time: 09/28/19  3:16 AM  Result Value Ref Range   Hgb A1c MFr Bld 5.9 (H) 4.8 - 5.6 %    Comment: (NOTE) Pre diabetes:          5.7%-6.4%  Diabetes:              >6.4%  Glycemic control for   <7.0% adults with diabetes    Mean Plasma Glucose 122.63  mg/dL    Comment: Performed at [facility] [address] [place], [place] [address]  Lipid panel     Status: None   Collection Time: 09/28/19  3:16 AM  Result Value Ref Range   Cholesterol 121 0 - 200 mg/dL   Triglycerides 70 <150 mg/dL   HDL 47 >40 mg/dL   Total CHOL/HDL Ratio 2.6 RATIO   VLDL 14 0 - 40 mg/dL   LDL Cholesterol 60 0 - 99 mg/dL    Comment:        Total Cholesterol/HDL:CHD Risk Coronary Heart Disease Risk Table                     Men   Women  1/2 Average Risk   3.4   3.3  Average Risk       5.0   4.4  2 X Average Risk   9.6   7.1  3 X Average Risk  23.4   11.0        Use the calculated Patient Ratio above and the CHD Risk Table to determine the patient's CHD Risk.        ATP III CLASSIFICATION (LDL):  <100     mg/dL   Optimal  100-129  mg/dL   Near or Above                    Optimal  130-159  mg/dL   Borderline  160-189  mg/dL   High  >190     mg/dL   Very High Performed at [facility] [address], [place], [place] [address]     Recent Results (from the past 240 hour(s))  Urine Culture     Status: None   Collection Time: 09/18/19  4:26 PM   Specimen: Urine  Result Value Ref Range Status   MICRO NUMBER: [id number]  Final   SPECIMEN QUALITY: Adequate  Final   Sample Source NOT GIVEN  Final   STATUS: FINAL  Final   ISOLATE 1:   Final    Growth of mixed flora was isolated, suggesting probable contamination. No further testing will be performed. If clinically indicated, recollection using a method to minimize contamination, with prompt transfer to Urine Culture Transport  Tube, is  recommended.   SARS Coronavirus 2 by RT PCR (hospital order, performed in [facility] hospital lab) Nasopharyngeal Nasopharyngeal Swab     Status: None   Collection Time: 09/27/19  8:11 PM   Specimen: Nasopharyngeal Swab  Result Value Ref Range Status   SARS Coronavirus 2 NEGATIVE NEGATIVE Final    Comment: (NOTE) SARS-CoV-2 target nucleic acids are NOT  DETECTED.  The SARS-CoV-2 RNA is generally detectable in upper and lower respiratory specimens during the acute phase of infection. The lowest concentration of SARS-CoV-2 viral copies this assay can detect is 250 copies / mL. A negative result does not preclude SARS-CoV-2 infection and should not be used as the sole basis for treatment or other patient management decisions.  A negative result may occur with improper specimen collection / handling, submission of specimen other than nasopharyngeal swab, presence of viral mutation(s) within the areas targeted by this assay, and inadequate number of viral copies (<250 copies / mL). A negative result must be combined with clinical observations, patient history, and epidemiological information.  Fact Sheet for Patients:   StrictlyIdeas.no  Fact Sheet for Healthcare Providers: BankingDealers.co.za  This test is not yet approved or  cleared by the [place] FDA and has been authorized for detection and/or diagnosis of SARS-CoV-2 by FDA under an Emergency Use Authorization (EUA).  This EUA will remain in effect (meaning this test can be used) for the duration of the COVID-19 declaration under Section 564(b)(1) of the Act, 21 U.S.C. section 360bbb-3(b)(1), unless the authorization is terminated or revoked sooner.  Performed at [facility], [place] [address], [place], [place] [address]     Lipid Panel Recent Labs    09/28/19 0316  CHOL 121  TRIG 70  HDL 47  CHOLHDL 2.6  VLDL 14  LDLCALC 60    Studies/Results: CT Code Stroke CTA Head W/WO contrast  Result Date: 09/27/2019 CLINICAL DATA:  Left-sided weakness EXAM: CT ANGIOGRAPHY HEAD AND NECK TECHNIQUE: Multidetector CT imaging of the head and neck was performed using the standard protocol during bolus administration of intravenous contrast. Multiplanar CT image reconstructions and MIPs were obtained to evaluate the vascular  anatomy. Carotid stenosis measurements (when applicable) are obtained utilizing NASCET criteria, using the distal internal carotid diameter as the denominator. CONTRAST:  82m OMNIPAQUE IOHEXOL 350 MG/ML SOLN COMPARISON:  None. FINDINGS: CTA NECK Aortic arch: Mild plaque along the aortic arch. Great vessel origins are patent. There is eccentric primarily noncalcified plaque at the left subclavian origin causing approximately 60% stenosis. Ulcerated plaque is also noted. Right carotid system: Patent. Minimal calcified plaque at the ICA origin without measurable stenosis. Left carotid system: Patent. Mild calcified plaque at the ICA origin without measurable stenosis. Vertebral arteries: Patent.  Codominant.  No measurable stenosis. Skeleton: Degenerative changes of the cervical spine. Other neck: No mass or adenopathy. Upper chest: No apical lung mass. Review of the MIP images confirms the above findings CTA HEAD Anterior circulation: Intracranial internal carotid arteries are patent. Anterior and middle cerebral arteries are patent. Posterior circulation: Intracranial vertebral arteries and PICA origins are patent. Basilar artery is patent. Superior cerebellar artery origins are patent. Patent posterior cerebral arteries. Venous sinuses: Patent as allowed by contrast bolus timing. Review of the MIP images confirms the above findings IMPRESSION: No large vessel occlusion. 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 Plaque ulceration is also noted. Electronically Signed   By: PMacy MisM.D.   On: 09/27/2019 14:52  CT Code Stroke CTA Neck W/WO contrast  Result Date: 09/27/2019 CLINICAL DATA:  Left-sided weakness EXAM: CT ANGIOGRAPHY HEAD AND NECK TECHNIQUE: Multidetector CT imaging of the head and neck was performed using the standard protocol during bolus administration of intravenous contrast. Multiplanar CT image reconstructions and MIPs were obtained to  evaluate the vascular anatomy. Carotid stenosis measurements (when applicable) are obtained utilizing NASCET criteria, using the distal internal carotid diameter as the denominator. CONTRAST:  68m OMNIPAQUE IOHEXOL 350 MG/ML SOLN COMPARISON:  None. FINDINGS: CTA NECK Aortic arch: Mild plaque along the aortic arch. Great vessel origins are patent. There is eccentric primarily noncalcified plaque at the left subclavian origin causing approximately 60% stenosis. Ulcerated plaque is also noted. Right carotid system: Patent. Minimal calcified plaque at the ICA origin without measurable stenosis. Left carotid system: Patent. Mild calcified plaque at the ICA origin without measurable stenosis. Vertebral arteries: Patent.  Codominant.  No measurable stenosis. Skeleton: Degenerative changes of the cervical spine. Other neck: No mass or adenopathy. Upper chest: No apical lung mass. Review of the MIP images confirms the above findings CTA HEAD Anterior circulation: Intracranial internal carotid arteries are patent. Anterior and middle cerebral arteries are patent. Posterior circulation: Intracranial vertebral arteries and PICA origins are patent. Basilar artery is patent. Superior cerebellar artery origins are patent. Patent posterior cerebral arteries. Venous sinuses: Patent as allowed by contrast bolus timing. Review of the MIP images confirms the above findings IMPRESSION: No large vessel occlusion. 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 Plaque ulceration is also noted. Electronically Signed   By: PMacy MisM.D.   On: 09/27/2019 14:52   MR BRAIN WO CONTRAST  Result Date: 09/27/2019 CLINICAL DATA:  69year old female left side weakness code stroke presentation today. EXAM: MRI HEAD WITHOUT CONTRAST TECHNIQUE: Multiplanar, multiecho pulse sequences of the brain and surrounding structures were obtained without intravenous contrast. COMPARISON:  CTA head and  neck, CT head earlier today. [facility]. Report of an outside brain MRI without and with contrast 09/15/2019. FINDINGS: Brain: No parenchymal restricted or facilitated diffusion is identified. However, there may be subtle a abnormal diffusion along the surface of the right superior frontal lobe (series 7, image 50). And there is persistent abnormal appearance of the superior right frontal lobe and also contralateral left cingulate sulci on FLAIR imaging with hyperintensity (series 11, image 20). But no gyral edema or encephalomalacia is evident. SWI imaging is unremarkable, and noncontrast CT today demonstrated only subtle asymmetric effacement of the sulci. Questionable similar but less pronounced FLAIR abnormality along the left inferior parietal/occipital junction on series 11, image 14 today, and stable from 09/22/2019. No similar FLAIR abnormality elsewhere. No intraventricular debris. Basilar cisterns appear normal. No midline shift, mass effect, evidence of mass lesion, ventriculomegaly, extra-axial collection or acute intracranial hemorrhage. Cervicomedullary junction and pituitary are within normal limits. Vascular: Major intracranial vascular flow voids are stable. Skull and upper cervical spine: Negative visible cervical spine. Visualized bone marrow signal is within normal limits. Sinuses/Orbits: Stable, negative. Other: Mastoids remain clear.  Scalp and face appear negative. IMPRESSION: 1. Persistent abnormal FLAIR signal in the sulci over the superior right frontal convexity, first reported on 09/15/2019 and reportedly with abnormal enhancement at that time. But no evidence of developing encephalomalacia, no evidence of blood products on CT or MRI, and no convincing gyral/parenchymal signal or diffusion abnormality (although questionable abnormal leptomeningeal appearance there on DWI). Similar abnormal sulci in the left cingulate  and at the left  parieto-occipital junction. But other CSF spaces appear normal. 2. The etiology is unclear and CSF analysis is recommended. 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 Electronically Signed   By: Genevie Ann M.D.   On: 09/27/2019 18:49   ECHOCARDIOGRAM COMPLETE  Result Date: 09/28/2019    ECHOCARDIOGRAM REPORT   Patient Name:   Danielle Fox Date of Exam: 09/27/2019 Medical Rec #:  [id number]         Height:       61.0 in Accession #:    [id number]        Weight:       117.4 lb Date of Birth:  08-02-1950          BSA:          1.506 m Patient Age:    69 years          BP:           121/56 mmHg Patient Gender: F                 HR:           91 bpm. Exam Location:  Inpatient Procedure: 2D Echo Indications:    TIA  History:        Patient has prior history of Echocardiogram examinations, most                 recent 11/11/2017. COPD; Risk Factors:Former Smoker.  Sonographer:    Jannett Celestine RDCS (AE) Referring Phys: [id number] Lequita Halt  Sonographer Comments: Suboptimal parasternal window. off axis apical windows IMPRESSIONS  1. Left ventricular ejection fraction, by estimation, is 60 to 65%. The left ventricle has normal function. The left ventricle has no regional wall motion abnormalities. Left ventricular diastolic parameters were normal.  2. Right ventricular systolic function is normal. The right ventricular size is normal. Tricuspid regurgitation signal is inadequate for assessing PA pressure.  3. The mitral valve is normal in structure. No evidence of mitral valve regurgitation.  4. The aortic valve was not well visualized. Aortic valve regurgitation is not visualized. No aortic stenosis is present. FINDINGS  Left Ventricle: Left ventricular ejection fraction, by estimation, is 60 to 65%. The left ventricle has normal function. The left ventricle has no regional wall motion abnormalities. The left ventricular internal cavity size  was normal in size. There is  no left ventricular hypertrophy. Left ventricular diastolic parameters were normal. Right Ventricle: The right ventricular size is normal. No increase in right ventricular wall thickness. Right ventricular systolic function is normal. Tricuspid regurgitation signal is inadequate for assessing PA pressure. Left Atrium: Left atrial size was normal in size. Right Atrium: Right atrial size was not well visualized. Pericardium: There is no evidence of pericardial effusion. Mitral Valve: The mitral valve is normal in structure. No evidence of mitral valve regurgitation. Tricuspid Valve: The tricuspid valve is grossly normal. Tricuspid valve regurgitation is trivial. Aortic Valve: The aortic valve was not well visualized. Aortic valve regurgitation is not visualized. No aortic stenosis is present. Pulmonic Valve: The pulmonic valve was not well visualized. Pulmonic valve regurgitation is not visualized. Aorta: The aortic root was not well visualized. IAS/Shunts: The interatrial septum was not well visualized.  LEFT VENTRICLE PLAX 2D LVIDd:         3.50 cm Diastology LVIDs:         2.30 cm LV e'  lateral:   9.68 cm/s LV PW:         1.00 cm LV E/e' lateral: 6.1 LV IVS:        0.80 cm  LEFT ATRIUM           Index LA diam:      2.60 cm 1.73 cm/m LA Vol (A2C): 19.5 ml 12.95 ml/m  AORTIC VALVE LVOT Vmax:   62.90 cm/s LVOT Vmean:  44.100 cm/s LVOT VTI:    0.126 m MITRAL VALVE MV Area (PHT): 3.72 cm    SHUNTS MV Decel Time: 204 msec    Systemic VTI: 0.13 m MV E velocity: 58.90 cm/s MV A velocity: 55.50 cm/s MV E/A ratio:  1.06 Oswaldo Milian MD Electronically signed by Oswaldo Milian MD Signature Date/Time: 09/28/2019/12:08:03 AM    Final    CT HEAD CODE STROKE WO CONTRAST  Result Date: 09/27/2019 CLINICAL DATA:  Code stroke.  Left-sided weakness EXAM: CT HEAD WITHOUT CONTRAST TECHNIQUE: Contiguous axial images were obtained from the base of the skull through the vertex without  intravenous contrast. COMPARISON:  CT head 09/17/2019 FINDINGS: Brain: No acute intracranial hemorrhage, mass effect, or edema. Gray-white differentiation remains preserved. Ventricles are stable in size. No extra-axial fluid collection. Vascular: No hyperdense vessel. Skull: Unremarkable. Sinuses/Orbits: No acute abnormality. Other: Mastoid air cells are clear. ASPECTS ([place] Stroke Program Early CT Score) - Ganglionic level infarction (caudate, lentiform nuclei, internal capsule, insula, M1-M3 cortex): 7 - Supraganglionic infarction (M4-M6 cortex): 3 Total score (0-10 with 10 being normal): 10 IMPRESSION: No acute intracranial hemorrhage or evidence acute infarction. ASPECT score is 10. These results were communicated to Dr. Cheral Marker at 2:32 pmon 7/29/2021by text page via the [facility] messaging system. Electronically Signed   By: Macy Mis M.D.   On: 09/27/2019 14:36    Medications:  Scheduled: . aspirin EC  81 mg Oral Daily  . atorvastatin  40 mg Oral QHS  . calcium carbonate  1 tablet Oral Q breakfast  . clopidogrel  75 mg Oral Daily  . enoxaparin (LOVENOX) injection  40 mg Subcutaneous Q24H  . fluticasone furoate-vilanterol  1 puff Inhalation Daily  . pantoprazole  40 mg Oral Daily   Continuous: . sodium chloride 100 mL/hr at 09/28/19 0960    MRI brain with/without contrast, 09/22/19: Cortical/sulcal hyperintensity within the anterior right hemisphere on the axial FLAIR sequence. According to the report from MRI performed 09/15/2019, this finding was also present at that time. This may be a late subacute sequela of an ischemic event. No abnormal contrast enhancement is visible on the current study, though it was reportedly present on the prior examination.   Assessment: 69 y.o. female with a recent presentation at an OSH in [place] for what was diagnosed as a stroke (08/2019) in the context of atypical imaging appearance on MRI, who re-presented to the [facility] ED on Thursday as a code stroke  for left sided weakness and facial droop, worsened relative to her recent new baseline.  -- CTH was negative for hemorrhage. CTA did not show any LVO.  -- MRI brain from yesterday (7/29) shows persistent abnormal FLAIR signal in the sulci over the superior right frontal convexity, first reported on 09/15/2019 and reportedly with abnormal enhancement at that time. But no evidence of developing encephalomalacia, no evidence of blood products on CT or MRI, and no convincing gyral/parenchymal signal or diffusion abnormality (although questionable abnormal leptomeningeal appearance there on DWI). Similar abnormal sulci in the left cingulate and at the left parieto-occipital  junction. But other CSF spaces appear normal.The etiology is unclear and CSF analysis is recommended. 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 -- Echocardiogram: Left ventricular ejection fraction, by estimation, is 60 to 65%. The left ventricle has normal function. The left ventricle has no regional wall motion abnormalities. No mural thrombus described in the report.  -- ESR elevated at 55 -- Findings on MRI are highly atypical for stroke. DDx for her persisting cortically-based lesions on MRI now more weighted towards an inflammatory process (rheumatoid meningitis), relatively indolent infectious process (syphilis, TB, fungal meningitis), malignancy (leptomeningeal carcinomatosis) and cortical edema from possible subclinical seizures.      Recommendations: -- Continue ASA, Plavix and statin -- EEG (ordered) -- PT consult, OT consult, Speech consult -- Telemetry monitoring -- Frequent neuro checks -- Discussed risks/benefits of LP with patient. Benefits primarily diagnostic information for treatment. Risks include nerve injury or spinal cord injury with paralysis of one or both lower extremities, intrathecal bleeding, infection and headache. The  patient expressed understanding and wish to proceed with LP. Benefits of obtaining diagnostic information from CSF as soon as possible significantly outweigh the relatively small increased risk of hemorrhagic complications while on DAPT. Consent form signed and witnessed. -- 12.5 mcg fentanyl IV x 1 prior to LP -- CSF labs to include cell count with differential, protein, glucose, VDRL, IgG index, oligoclonal bands, cytology (5 cc), cryptococcal antigen, gram stain, AFB smear, bacterial and fungal cultures, Lyme titers -- Serum RMSF titers    LOS: 1 day   '@Electronically'  signed: Dr. Kerney Elbe 09/28/2019  8:41 AM

## 2019-09-28 NOTE — TOC Initial Note (Signed)
Transition of Care [facility]) - Initial/Assessment Note    Patient Details  Name: Danielle Fox MRN: [id number] Date of Birth: June 16, 1950  [name]) CM/SW Contact:    Pollie Friar, RN Phone Number: 09/28/2019, 4:06 PM  Clinical Narrative:                 CM met with the patient and her significant other. Recommendations are for outpatient therapy. Pt wants to attend at [facility] therapy. CM has faxed the orders to [facility] outpatient therapy.  Pt denies issues with transportation or home medications. TOC following for further d/c needs.   Expected Discharge Plan: OP Rehab Barriers to Discharge: Continued Medical Work up   Patient Goals and CMS Choice     Choice offered to / list presented to : Patient  Expected Discharge Plan and Services Expected Discharge Plan: OP Rehab   Discharge Planning Services: CM Consult   Living arrangements for the past 2 months: Single Family Home                                      Prior Living Arrangements/Services Living arrangements for the past 2 months: Single Family Home Lives with:: Significant Other Patient language and need for interpreter reviewed:: Yes Do you feel safe going back to the place where you live?: Yes      Need for Family Participation in Patient Care: Yes (Comment) Care giver support system in place?: Yes (comment)   Criminal Activity/Legal Involvement Pertinent to Current Situation/Hospitalization: No - Comment as needed  Activities of Daily Living Home Assistive Devices/Equipment: None ADL Screening (condition at time of admission) Patient's cognitive ability adequate to safely complete daily activities?: Yes Is the patient deaf or have difficulty hearing?: No Does the patient have difficulty seeing, even when wearing glasses/contacts?: No Does the patient have difficulty concentrating, remembering, or making decisions?: No Patient able to express need for assistance with ADLs?:  Yes Does the patient have difficulty dressing or bathing?: Yes Independently performs ADLs?: No Communication: Independent Dressing (OT): Needs assistance Is this a change from baseline?: Pre-admission baseline Grooming: Needs assistance Is this a change from baseline?: Pre-admission baseline Feeding: Independent Bathing: Independent Toileting: Independent In/Out Bed: Independent Walks in Home: Independent Does the patient have difficulty walking or climbing stairs?: No Weakness of Legs: None Weakness of Arms/Hands: Left  Permission Sought/Granted                  Emotional Assessment Appearance:: Appears stated age Attitude/Demeanor/Rapport: Engaged Affect (typically observed): Accepting Orientation: : Oriented to Self, Oriented to Place, Oriented to  Time, Oriented to Situation   Psych Involvement: No (comment)  Admission diagnosis:  CVA (cerebral vascular accident) ([place]) [I63.9] Cerebrovascular accident (CVA), unspecified mechanism ([place]) [I63.9] Patient Active Problem List   Diagnosis Date Noted  . CVA (cerebral vascular accident) ([place]) 09/27/2019  . H/O: CVA (cerebrovascular accident) 09/19/2019  . Chills 09/19/2019  . Hypokalemia 09/19/2019  . Clostridioides difficile infection   . Diarrhea 09/24/2018  . Vitamin D deficiency 01/02/2017  . Welcome to Medicare preventive visit 06/29/2016  . Pruritus 06/29/2016  . Abnormal CT scan 08/14/2015  . Constipation 10/06/2014  . 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 Rib pain on left side 09/08/2014  . Chronic cough 06/14/2014  . SOB (shortness of breath) 06/14/2014  . Cough 05/20/2014  . Hyperlipidemia, mild 08/15/2011  . Osteopenia 03/01/2011  .  History of smoking 03/01/2011  . Musculoskeletal pain 03/01/2011   PCP:  Mosie Lukes, MD Pharmacy:   [id number] [id number]: [phone]: [phone]    Social Determinants of  Health (SDOH) Interventions    Readmission Risk Interventions No flowsheet data found.

## 2019-09-28 NOTE — Progress Notes (Signed)
CRITICAL VALUE ALERT  Critical Value:  WBC CSF 43  Date & Time Notied:  09/28/19 1858  Provider Notified: Dr. Avon Gully, [place]  Orders Received/Actions taken: Provider made aware

## 2019-09-28 NOTE — Progress Notes (Signed)
EEG Completed; Results Pending  

## 2019-09-28 NOTE — Progress Notes (Signed)
Procedure Note:  Patient provided informed consent for LP after risks/benefits discussed at length. Consent form signed. The patient was then prepped in sterile fashion, anesthetized locally with Lidocaine and Quincke needle inserted at the midline between the L4 and L5 spinous processed with patient in the seated fetal position. 4 attempts made at accessing the intrathecal space without success. Needle withdrawn, pressure held to puncture site for 2 minutes, bandage applied and patient guided back to the supine position in bed. Instructed to lay flat for 2 hours. LP under fluoro ordered. No complications.   Electronically signed: Dr. Kerney Elbe

## 2019-09-28 NOTE — Evaluation (Signed)
Physical Therapy Evaluation Patient Details Name: Danielle Fox MRN: [id number] DOB: January 01, 1951 Today's Date: 09/28/2019   History of Present Illness  Pt is a 69 y.o. female with PMH of recent stroke on 7/17, prediabetes, mild intermittent COPD, hypokalemia, dyspnea, HLD, presented with worsening of stroke symptoms (L sided weakness). 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  Clinical Impression  Patient presents with LUE weakness/incoordination and impaired mobility s/p above. Pt independent and lives with spouse PTA. Today, pt tolerated bed mobility, transfers and gait training with supervision-mod I for safety. Initially with some drifting with decreased arm swing but this improved with cues and increased distance. Encouraged functional use of LUE to improve coordination. Encouraged walking to bathroom. Pt does not require skilled therapy services while in the hospital. Education re: BeFAST. Discharge from therapy.     Follow Up Recommendations No PT follow up;Supervision - Intermittent    Equipment Recommendations  None recommended by PT    Recommendations for Other Services       Precautions / Restrictions Precautions Precautions: Fall Restrictions Weight Bearing Restrictions: No      Mobility  Bed Mobility Overal bed mobility: Needs Assistance Bed Mobility: Supine to Sit     Supine to sit: Modified independent (Device/Increase time)     General bed mobility comments: No assist needed.  Transfers Overall transfer level: Needs assistance Equipment used: None Transfers: Sit to/from Stand Sit to Stand: Supervision         General transfer comment: SUpervision for safety. Stood from [employer].  Ambulation/Gait Ambulation/Gait assistance: Supervision Gait Distance (Feet): 250 Feet Assistive device: None Gait Pattern/deviations: Step-through pattern;Decreased stride length;Drifts  right/left Gait velocity: decreased Gait velocity interpretation: 1.31 - 2.62 ft/sec, indicative of limited community ambulator General Gait Details: Slow, mostly steady gait wtih mild drifting noted; initially holding LUE across belly, cues for reciproal arm swing. Able to tolerate higher level balance challenges, see balance section.  Stairs            Wheelchair Mobility    Modified Rankin (Stroke Patients Only) Modified Rankin (Stroke Patients Only) Pre-Morbid Rankin Score: Slight disability Modified Rankin: Slight disability     Balance Overall balance assessment: Needs assistance Sitting-balance support: Feet supported;No upper extremity supported Sitting balance-Leahy Scale: Good     Standing balance support: During functional activity Standing balance-Leahy Scale: Good               High level balance activites: Head turns;Direction changes High Level Balance Comments: Able to perform above and step over objects in hallway without evidence of imbalance.             Pertinent Vitals/Pain Pain Assessment: No/denies pain (nausea)    Home Living Family/patient expects to be discharged to:: Private residence Living Arrangements: Spouse/significant other Available Help at Discharge: Family;Available PRN/intermittently Type of Home: House Home Access: Stairs to enter   [employer] of Steps: 1 tiny step Home Layout: One level Home Equipment: Shower seat - built in;Grab bars - tub/shower      Prior Function Level of Independence: Independent         Comments: Driving     Hand Dominance   Dominant Hand: Right    Extremity/Trunk Assessment   Upper Extremity Assessment Upper Extremity Assessment: Defer to OT evaluation;LUE deficits/detail LUE Deficits / Details: Impaired coordination noted especially with FTN nose; dysmetria present. LUE Sensation: decreased proprioception LUE Coordination: decreased fine motor;decreased  gross  motor    Lower Extremity Assessment Lower Extremity Assessment: Generalized weakness (but functional)    Cervical / Trunk Assessment Cervical / Trunk Assessment: Normal  Communication   Communication: Expressive difficulties (slow, per pt report)  Cognition Arousal/Alertness: Awake/alert Behavior During Therapy: WFL for tasks assessed/performed Overall Cognitive Status: Within Functional Limits for tasks assessed                                 General Comments: Pt A&Ox4. Follows commands well.      General Comments General comments (skin integrity, edema, etc.): Nausea upon arrival but pt RN gave meds and it resolved in session. Hungry.    Exercises     Assessment/Plan    PT Assessment Patent does not need any further PT services  PT Problem List Decreased strength;Decreased coordination       PT Treatment Interventions      PT Goals (Current goals can be found in the Care Plan section)  Acute Rehab PT Goals Patient Stated Goal: to eat something and go home PT Goal Formulation: All assessment and education complete, DC therapy    Frequency     Barriers to discharge        Co-evaluation               AM-PAC PT "6 Clicks" Mobility  Outcome Measure Help needed turning from your back to your side while in a flat bed without using bedrails?: None Help needed moving from lying on your back to sitting on the side of a flat bed without using bedrails?: None Help needed moving to and from a bed to a chair (including a wheelchair)?: None Help needed standing up from a chair using your arms (e.g., wheelchair or bedside chair)?: None Help needed to walk in hospital room?: None Help needed climbing 3-5 steps with a railing? : A Little 6 Click Score: 23    End of Session   Activity Tolerance: Patient tolerated treatment well Patient left: in bed;with call bell/phone within reach Nurse Communication: Mobility status PT Visit Diagnosis: Difficulty in  walking, not elsewhere classified (R26.2)    Time: 5498-2641 PT Time Calculation (min) (ACUTE ONLY): 26 min   Charges:   PT Evaluation $PT Eval Moderate Complexity: 1 Mod PT Treatments $Gait Training: 8-22 mins        Marisa Severin, PT, DPT Acute Rehabilitation Services Pager [phone] Office [phone]      Danielle Fox 09/28/2019, 12:04 PM

## 2019-09-28 NOTE — Progress Notes (Signed)
SLP Cancellation Note  Patient Details Name: Danielle Fox MRN: [id number] DOB: [birth year]   Cancelled treatment:        Attempted to see pt for cognitive linguistic evaluation.  Pt was unavailable for bedside procedure, and is scheduled for LP shortly after and will need to lie flat following.  SLP to reattempt as schedule permits.   Celedonio Savage, MA, [place] Office: [phone]  09/28/2019, 11:40 AM

## 2019-09-28 NOTE — Progress Notes (Signed)
PROGRESS NOTE    Danielle Fox  YJE:[id number] DOB: March 25, 1950 DOA: 09/27/2019 PCP: Mosie Lukes, MD   Brief Narrative:  Danielle Fox is a 69 y.o. female with medical history significant of recent stroke on 7/17, prediabetes, mild intermittent COPD, HLD, presented with worsening of stroke symptoms.  Patient was on the trip to [place] when she developed left arm weakness, she described as " my arm will not do what I wanted to do and coordination is off" and she went to a local ER 1 week later, MRI showed subtle enhancement on the right posterior frontal lobe and anterior right parietal lobe which favored subacute cortical infarct.  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  During that hospital stay, her left-sided weakness resolved and she was discharged.  While traveling back to [place], patient developed numbness of left upper lip and spasm of the left arm.  She went to her PCP 9 days ago, who ordered another MRI, which was done 3 days ago.  MRI done 3 days ago showed cortical hyperintensity right anterior hemisphere which correlated to the similar finding in MRI done in [place]. This morning, patient woke up with left facial droop and worsening of left arm weakness, she could not raise her arm above left shoulder.  Which is significant deterioration compared to her initial stroke presentation 12 days ago.  Patient also reported she developed tonic headache under right head overnight.  Patient denied any chest pain, no blurry vision. 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  Assessment & Plan:   Active Problems:   CVA (cerebral vascular accident) ([place])   Acute on subacute CVA Rule out atypical seizure/todd's paralysis vs possible demyelinating disease - Evolving of symptoms with worsening of left-sided weakness and a new  left-sided facial droop and brief speech difficulty in the morning. - CT unremarkable for acute process, MRI similar to previous scans - Continue Plavix/aspirin/statin - Neuro following- LP/EEG pending for further workup - PT and speech evaluation  Left subclavian artery stenosis -Patient denied any lightheadedness vision changes or chest pains -Outpt vascular f/u  Hypocalcemia - PO replacement and check vitamin D level Lab Results  Component Value Date   VD25OH 18.27 (L) 09/27/2019   Headache, atypical -Location seems overlapping the stroke area, but ESR elevated Lab Results  Component Value Date   ESRSEDRATE 55 (H) 09/27/2019   HLD -Check lipid panel, continue statin  DVT prophylaxis: Lovenox Code Status: Full code Family Communication: Husband at bedside  Status is: Inpatient  Dispo: The patient is from: Home              Anticipated d/c is to: Home              Anticipated d/c date is: 24 to 48 hours              Patient currently not medically stable for discharge given need for ongoing work-up and evaluation and possible procedure with neurology  Consultants:   Neurology  Procedures:   Tentatively planned LP  Antimicrobials:  None indicated  Subjective: Acute issues or events overnight, patient does continue to complain of right frontotemporal headache throbbing in nature but denies vision changes, weakness, nausea, vomiting, diarrhea, constipation, headache, fevers, chills.  Objective: Vitals:   09/27/19 2215 09/27/19 2230 09/27/19 2329 09/28/19 0332  BP: (!) 103/59 (!) 105/60 Marland Kitchen)  124/56 (!) 110/61  Pulse: 78 79 80 80  Resp: '20 21 18 18  ' Temp:   97.8 F (36.6 C) 98.2 F (36.8 C)  TempSrc:   Oral Oral  SpO2: 95% 97% 96% 99%  Weight:   52 kg   Height:   '5\' 1"'  (1.549 m)    No intake or output data in the 24 hours ending 09/28/19 0735 Filed Weights   09/27/19 2329  Weight: 52 kg    Examination:  General:  Pleasantly resting in bed, No  acute distress. HEENT:  Normocephalic atraumatic.  Sclerae nonicteric, noninjected.  Extraocular movements intact bilaterally. Neck:  Without mass or deformity.  Trachea is midline. Lungs:  Clear to auscultate bilaterally without rhonchi, wheeze, or rales. Heart:  Regular rate and rhythm.  Without murmurs, rubs, or gallops. Abdomen:  Soft, nontender, nondistended.  Without guarding or rebound. Extremities: Without cyanosis, clubbing, edema, or obvious deformity. Vascular:  Dorsalis pedis and posterior tibial pulses palpable bilaterally. Skin:  Warm and dry, no erythema, no ulcerations.   Data Reviewed: I have personally reviewed following labs and imaging studies  CBC: Recent Labs  Lab 09/27/19 1423 09/27/19 1424  WBC 11.6*  --   NEUTROABS 9.4*  --   HGB 12.3 13.3  HCT 39.5 39.0  MCV 83.5  --   PLT 412*  --    Basic Metabolic Panel: Recent Labs  Lab 09/27/19 1423 09/27/19 1424  NA 135 135  K 4.2 4.1  CL 100 99  CO2 23  --   GLUCOSE 90 89  BUN 6* 6*  CREATININE 0.83 0.70  CALCIUM 9.4  --    GFR: Estimated Creatinine Clearance: 50.1 mL/min (by C-G formula based on SCr of 0.7 mg/dL). Liver Function Tests: Recent Labs  Lab 09/27/19 1423  AST 16  ALT 15  ALKPHOS 79  BILITOT 0.6  PROT 7.1  ALBUMIN 3.3*   No results for input(s): LIPASE, AMYLASE in the last 168 hours. No results for input(s): AMMONIA in the last 168 hours. Coagulation Profile: Recent Labs  Lab 09/27/19 1423  INR 1.0   Cardiac Enzymes: No results for input(s): CKTOTAL, CKMB, CKMBINDEX, TROPONINI in the last 168 hours. BNP (last 3 results) No results for input(s): PROBNP in the last 8760 hours. HbA1C: Recent Labs    09/28/19 0316  HGBA1C 5.9*   CBG: No results for input(s): GLUCAP in the last 168 hours. Lipid Profile: Recent Labs    09/28/19 0316  CHOL 121  HDL 47  LDLCALC 60  TRIG 70  CHOLHDL 2.6   Thyroid Function Tests: No results for input(s): TSH, T4TOTAL, FREET4, T3FREE,  THYROIDAB in the last 72 hours. Anemia Panel: No results for input(s): VITAMINB12, FOLATE, FERRITIN, TIBC, IRON, RETICCTPCT in the last 72 hours. Sepsis Labs: No results for input(s): PROCALCITON, LATICACIDVEN in the last 168 hours.  Recent Results (from the past 240 hour(s))  Urine Culture     Status: None   Collection Time: 09/18/19  4:26 PM   Specimen: Urine  Result Value Ref Range Status   MICRO NUMBER: [id number]  Final   SPECIMEN QUALITY: Adequate  Final   Sample Source NOT GIVEN  Final   STATUS: FINAL  Final   ISOLATE 1:   Final    Growth of mixed flora was isolated, suggesting probable contamination. No further testing will be performed. If clinically indicated, recollection using a method to minimize contamination, with prompt transfer to Urine Culture Transport Tube, is  recommended.   SARS  Coronavirus 2 by RT PCR (hospital order, performed in [facility] hospital lab) Nasopharyngeal Nasopharyngeal Swab     Status: None   Collection Time: 09/27/19  8:11 PM   Specimen: Nasopharyngeal Swab  Result Value Ref Range Status   SARS Coronavirus 2 NEGATIVE NEGATIVE Final    Comment: (NOTE) SARS-CoV-2 target nucleic acids are NOT DETECTED.  The SARS-CoV-2 RNA is generally detectable in upper and lower respiratory specimens during the acute phase of infection. The lowest concentration of SARS-CoV-2 viral copies this assay can detect is 250 copies / mL. A negative result does not preclude SARS-CoV-2 infection and should not be used as the sole basis for treatment or other patient management decisions.  A negative result may occur with improper specimen collection / handling, submission of specimen other than nasopharyngeal swab, presence of viral mutation(s) within the areas targeted by this assay, and inadequate number of viral copies (<250 copies / mL). A negative result must be combined with clinical observations, patient history, and epidemiological information.  Fact Sheet for  Patients:   StrictlyIdeas.no  Fact Sheet for Healthcare Providers: BankingDealers.co.za  This test is not yet approved or  cleared by the [place] FDA and has been authorized for detection and/or diagnosis of SARS-CoV-2 by FDA under an Emergency Use Authorization (EUA).  This EUA will remain in effect (meaning this test can be used) for the duration of the COVID-19 declaration under Section 564(b)(1) of the Act, 21 U.S.C. section 360bbb-3(b)(1), unless the authorization is terminated or revoked sooner.  Performed at [facility], [place] [address] [place], [place] [address]          Radiology Studies: CT Code Stroke CTA Head W/WO contrast  Result Date: 09/27/2019 CLINICAL DATA:  Left-sided weakness EXAM: CT ANGIOGRAPHY HEAD AND NECK TECHNIQUE: Multidetector CT imaging of the head and neck was performed using the standard protocol during bolus administration of intravenous contrast. Multiplanar CT image reconstructions and MIPs were obtained to evaluate the vascular anatomy. Carotid stenosis measurements (when applicable) are obtained utilizing NASCET criteria, using the distal internal carotid diameter as the denominator. CONTRAST:  2m OMNIPAQUE IOHEXOL 350 MG/ML SOLN COMPARISON:  None. FINDINGS: CTA NECK Aortic arch: Mild plaque along the aortic arch. Great vessel origins are patent. There is eccentric primarily noncalcified plaque at the left subclavian origin causing approximately 60% stenosis. Ulcerated plaque is also noted. Right carotid system: Patent. Minimal calcified plaque at the ICA origin without measurable stenosis. Left carotid system: Patent. Mild calcified plaque at the ICA origin without measurable stenosis. Vertebral arteries: Patent.  Codominant.  No measurable stenosis. Skeleton: Degenerative changes of the cervical spine. Other neck: No mass or adenopathy. Upper chest: No apical lung mass. Review of the MIP images  confirms the above findings CTA HEAD Anterior circulation: Intracranial internal carotid arteries are patent. Anterior and middle cerebral arteries are patent. Posterior circulation: Intracranial vertebral arteries and PICA origins are patent. Basilar artery is patent. Superior cerebellar artery origins are patent. Patent posterior cerebral arteries. Venous sinuses: Patent as allowed by contrast bolus timing. Review of the MIP images confirms the above findings IMPRESSION: No large vessel occlusion. 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 Plaque ulceration is also noted. Electronically Signed   By: PMacy MisM.D.   On: 09/27/2019 14:52   CT Code Stroke CTA Neck W/WO contrast  Result Date: 09/27/2019 CLINICAL DATA:  Left-sided weakness EXAM: CT ANGIOGRAPHY HEAD AND NECK TECHNIQUE: Multidetector CT imaging of the head  and neck was performed using the standard protocol during bolus administration of intravenous contrast. Multiplanar CT image reconstructions and MIPs were obtained to evaluate the vascular anatomy. Carotid stenosis measurements (when applicable) are obtained utilizing NASCET criteria, using the distal internal carotid diameter as the denominator. CONTRAST:  48m OMNIPAQUE IOHEXOL 350 MG/ML SOLN COMPARISON:  None. FINDINGS: CTA NECK Aortic arch: Mild plaque along the aortic arch. Great vessel origins are patent. There is eccentric primarily noncalcified plaque at the left subclavian origin causing approximately 60% stenosis. Ulcerated plaque is also noted. Right carotid system: Patent. Minimal calcified plaque at the ICA origin without measurable stenosis. Left carotid system: Patent. Mild calcified plaque at the ICA origin without measurable stenosis. Vertebral arteries: Patent.  Codominant.  No measurable stenosis. Skeleton: Degenerative changes of the cervical spine. Other neck: No mass or adenopathy. Upper chest: No apical lung mass.  Review of the MIP images confirms the above findings CTA HEAD Anterior circulation: Intracranial internal carotid arteries are patent. Anterior and middle cerebral arteries are patent. Posterior circulation: Intracranial vertebral arteries and PICA origins are patent. Basilar artery is patent. Superior cerebellar artery origins are patent. Patent posterior cerebral arteries. Venous sinuses: Patent as allowed by contrast bolus timing. Review of the MIP images confirms the above findings IMPRESSION: No large vessel occlusion. 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 Plaque ulceration is also noted. Electronically Signed   By: PMacy MisM.D.   On: 09/27/2019 14:52   MR BRAIN WO CONTRAST  Result Date: 09/27/2019 CLINICAL DATA:  69year old female left side weakness code stroke presentation today. EXAM: MRI HEAD WITHOUT CONTRAST TECHNIQUE: Multiplanar, multiecho pulse sequences of the brain and surrounding structures were obtained without intravenous contrast. COMPARISON:  CTA head and neck, CT head earlier today. [facility]. Report of an outside brain MRI without and with contrast 09/15/2019. FINDINGS: Brain: No parenchymal restricted or facilitated diffusion is identified. However, there may be subtle a abnormal diffusion along the surface of the right superior frontal lobe (series 7, image 50). And there is persistent abnormal appearance of the superior right frontal lobe and also contralateral left cingulate sulci on FLAIR imaging with hyperintensity (series 11, image 20). But no gyral edema or encephalomalacia is evident. SWI imaging is unremarkable, and noncontrast CT today demonstrated only subtle asymmetric effacement of the sulci. Questionable similar but less pronounced FLAIR abnormality along the left inferior parietal/occipital junction on series 11, image 14 today, and stable from 09/22/2019. No similar  FLAIR abnormality elsewhere. No intraventricular debris. Basilar cisterns appear normal. No midline shift, mass effect, evidence of mass lesion, ventriculomegaly, extra-axial collection or acute intracranial hemorrhage. Cervicomedullary junction and pituitary are within normal limits. Vascular: Major intracranial vascular flow voids are stable. Skull and upper cervical spine: Negative visible cervical spine. Visualized bone marrow signal is within normal limits. Sinuses/Orbits: Stable, negative. Other: Mastoids remain clear.  Scalp and face appear negative. IMPRESSION: 1. Persistent abnormal FLAIR signal in the sulci over the superior right frontal convexity, first reported on 09/15/2019 and reportedly with abnormal enhancement at that time. But no evidence of developing encephalomalacia, no evidence of blood products on CT or MRI, and no convincing gyral/parenchymal signal or diffusion abnormality (although questionable abnormal leptomeningeal appearance there on DWI). Similar abnormal sulci in the left cingulate and at the left parieto-occipital junction. But other CSF spaces appear normal. 2. The etiology is unclear and CSF analysis is recommended. 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 Electronically Signed   By: Genevie Ann M.D.   On: 09/27/2019 18:49   ECHOCARDIOGRAM COMPLETE  Result Date: 09/28/2019    ECHOCARDIOGRAM REPORT   Patient Name:   Danielle Fox Date of Exam: 09/27/2019 Medical Rec #:  [id number]         Height:       61.0 in Accession #:    [id number]        Weight:       117.4 lb Date of Birth:  September 18, 1950          BSA:          1.506 m Patient Age:    19 years          BP:           121/56 mmHg Patient Gender: F                 HR:           91 bpm. Exam Location:  Inpatient Procedure: 2D Echo Indications:    TIA  History:        Patient has prior history of Echocardiogram examinations, most                  recent 11/11/2017. COPD; Risk Factors:Former Smoker.  Sonographer:    Jannett Celestine RDCS (AE) Referring Phys: [id number] Lequita Halt  Sonographer Comments: Suboptimal parasternal window. off axis apical windows IMPRESSIONS  1. Left ventricular ejection fraction, by estimation, is 60 to 65%. The left ventricle has normal function. The left ventricle has no regional wall motion abnormalities. Left ventricular diastolic parameters were normal.  2. Right ventricular systolic function is normal. The right ventricular size is normal. Tricuspid regurgitation signal is inadequate for assessing PA pressure.  3. The mitral valve is normal in structure. No evidence of mitral valve regurgitation.  4. The aortic valve was not well visualized. Aortic valve regurgitation is not visualized. No aortic stenosis is present. FINDINGS  Left Ventricle: Left ventricular ejection fraction, by estimation, is 60 to 65%. The left ventricle has normal function. The left ventricle has no regional wall motion abnormalities. The left ventricular internal cavity size was normal in size. There is  no left ventricular hypertrophy. Left ventricular diastolic parameters were normal. Right Ventricle: The right ventricular size is normal. No increase in right ventricular wall thickness. Right ventricular systolic function is normal. Tricuspid regurgitation signal is inadequate for assessing PA pressure. Left Atrium: Left atrial size was normal in size. Right Atrium: Right atrial size was not well visualized. Pericardium: There is no evidence of pericardial effusion. Mitral Valve: The mitral valve is normal in structure. No evidence of mitral valve regurgitation. Tricuspid Valve: The tricuspid valve is grossly normal. Tricuspid valve regurgitation is trivial. Aortic Valve: The aortic valve was not well visualized. Aortic valve regurgitation is not visualized. No aortic stenosis is present. Pulmonic Valve: The pulmonic valve was not well visualized. Pulmonic  valve regurgitation is not visualized. Aorta: The aortic root was not well visualized. IAS/Shunts: The interatrial septum was not well visualized.  LEFT VENTRICLE PLAX 2D LVIDd:         3.50 cm Diastology LVIDs:         2.30 cm LV e' lateral:   9.68 cm/s LV PW:         1.00 cm LV E/e' lateral: 6.1 LV IVS:  0.80 cm  LEFT ATRIUM           Index LA diam:      2.60 cm 1.73 cm/m LA Vol (A2C): 19.5 ml 12.95 ml/m  AORTIC VALVE LVOT Vmax:   62.90 cm/s LVOT Vmean:  44.100 cm/s LVOT VTI:    0.126 m MITRAL VALVE MV Area (PHT): 3.72 cm    SHUNTS MV Decel Time: 204 msec    Systemic VTI: 0.13 m MV E velocity: 58.90 cm/s MV A velocity: 55.50 cm/s MV E/A ratio:  1.06 Oswaldo Milian MD Electronically signed by Oswaldo Milian MD Signature Date/Time: 09/28/2019/12:08:03 AM    Final    CT HEAD CODE STROKE WO CONTRAST  Result Date: 09/27/2019 CLINICAL DATA:  Code stroke.  Left-sided weakness EXAM: CT HEAD WITHOUT CONTRAST TECHNIQUE: Contiguous axial images were obtained from the base of the skull through the vertex without intravenous contrast. COMPARISON:  CT head 09/17/2019 FINDINGS: Brain: No acute intracranial hemorrhage, mass effect, or edema. Gray-white differentiation remains preserved. Ventricles are stable in size. No extra-axial fluid collection. Vascular: No hyperdense vessel. Skull: Unremarkable. Sinuses/Orbits: No acute abnormality. Other: Mastoid air cells are clear. ASPECTS ([place] Stroke Program Early CT Score) - Ganglionic level infarction (caudate, lentiform nuclei, internal capsule, insula, M1-M3 cortex): 7 - Supraganglionic infarction (M4-M6 cortex): 3 Total score (0-10 with 10 being normal): 10 IMPRESSION: No acute intracranial hemorrhage or evidence acute infarction. ASPECT score is 10. These results were communicated to Dr. Cheral Marker at 2:32 pmon 7/29/2021by text page via the [facility] messaging system. Electronically Signed   By: Macy Mis M.D.   On: 09/27/2019 14:36         Scheduled Meds: . aspirin EC  81 mg Oral Daily  . atorvastatin  40 mg Oral QHS  . calcium carbonate  1 tablet Oral Q breakfast  . clopidogrel  75 mg Oral Daily  . enoxaparin (LOVENOX) injection  40 mg Subcutaneous Q24H  . fluticasone furoate-vilanterol  1 puff Inhalation Daily  . pantoprazole  40 mg Oral Daily   Continuous Infusions: . sodium chloride 100 mL/hr at 09/28/19 3710     LOS: 1 day   Time spent: 40mn  Noma Quijas C Hannah Strader, DO [facility]  If 7PM-7AM, please contact night-coverage [email or web address]  09/28/2019, 7:35 AM

## 2019-09-28 NOTE — Procedures (Signed)
Patient Name: Danielle Fox  MRN: [id number]  Epilepsy Attending: Lora Havens  Referring Physician/Provider: Dr Kerney Elbe Date: 09/28/2019 Duration: 23.36 mins  Patient history: 69 y.o.femalewitha recentpresentation at an OSH in [place] for what was diagnosed as a stroke (08/2019) in the context of atypical imaging appearance on MRI, who re-presented [place] ED on Thursday as a code stroke for left sidedweakness and facial droop,worsened relative to her recent newbaseline. EEG to evaluate for seizure.  Level of alertness: Awake, drowsy  AEDs during EEG study: None  Technical aspects: This EEG study was done with scalp electrodes positioned according to the 10-20 International system of electrode placement. Electrical activity was acquired at a sampling rate of 500Hz  and reviewed with a high frequency filter of 70Hz  and a low frequency filter of 1Hz . EEG data were recorded continuously and digitally stored.   Description: The posterior dominant rhythm consists of 9 Hz activity of moderate voltage (25-35 uV) seen predominantly in posterior head regions, symmetric and reactive to eye opening and eye closing. Drowsiness was characterized by attenuation of the posterior background rhythm. EEG showed intermittent 3 to 6 Hz theta-delta slowing in right hemisphere.  Hyperventilation and photic stimulation were not performed.     ABNORMALITY -Intermittent slow, right hemisphere  IMPRESSION: This study is suggestive of cortical dysfunction in right hemisphere nonspecific etiology but likely related to underlying cortical abnormality. No seizures or epileptiform discharges were seen throughout the recording.   Wladyslaw Henrichs Barbra Sarks

## 2019-09-28 NOTE — Plan of Care (Signed)
  Problem: Education: Goal: Knowledge of disease or condition will improve 09/28/2019 0030 by Ane Payment, RN Outcome: Progressing 09/28/2019 0029 by Ane Payment, RN Outcome: Progressing Goal: Knowledge of secondary prevention will improve 09/28/2019 0030 by Ane Payment, RN Outcome: Progressing 09/28/2019 0029 by Ane Payment, RN Outcome: Progressing Goal: Knowledge of patient specific risk factors addressed and post discharge goals established will improve 09/28/2019 0030 by Ane Payment, RN Outcome: Progressing 09/28/2019 0029 by Ane Payment, RN Outcome: Progressing   Problem: Coping: Goal: Will verbalize positive feelings about self 09/28/2019 0030 by Ane Payment, RN Outcome: Progressing 09/28/2019 0029 by Ane Payment, RN Outcome: Progressing Goal: Will identify appropriate support needs 09/28/2019 0030 by Ane Payment, RN Outcome: Progressing 09/28/2019 0029 by Ane Payment, RN Outcome: Progressing   Problem: Self-Care: Goal: Ability to participate in self-care as condition permits will improve 09/28/2019 0030 by Ane Payment, RN Outcome: Progressing 09/28/2019 0029 by Ane Payment, RN Outcome: Progressing   Problem: Education: Goal: Knowledge of General Education information will improve Description: Including pain rating scale, medication(s)/side effects and non-pharmacologic comfort measures Outcome: Progressing   Problem: Health Behavior/Discharge Planning: Goal: Ability to manage health-related needs will improve Outcome: Progressing   Problem: Clinical Measurements: Goal: Ability to maintain clinical measurements within normal limits will improve Outcome: Progressing Goal: Will remain free from infection Outcome: Progressing Goal: Diagnostic test results will improve Outcome: Progressing Goal: Respiratory complications will improve Outcome: Progressing Goal: Cardiovascular complication  will be avoided Outcome: Progressing   Problem: Activity: Goal: Risk for activity intolerance will decrease Outcome: Progressing   Problem: Nutrition: Goal: Adequate nutrition will be maintained Outcome: Progressing   Problem: Coping: Goal: Level of anxiety will decrease Outcome: Progressing   Problem: Elimination: Goal: Will not experience complications related to bowel motility Outcome: Progressing Goal: Will not experience complications related to urinary retention Outcome: Progressing   Problem: Pain Managment: Goal: General experience of comfort will improve Outcome: Progressing   Problem: Safety: Goal: Ability to remain free from injury will improve Outcome: Progressing   Problem: Skin Integrity: Goal: Risk for impaired skin integrity will decrease Outcome: Progressing

## 2019-09-28 NOTE — Evaluation (Signed)
Physical Therapy Evaluation Patient Details Name: Danielle Fox MRN: [id number] DOB: 06/17/1950 Today's Date: 09/28/2019   History of Present Illness  Pt is a 69 y.o. female with PMH of recent stroke on 7/17, prediabetes, mild intermittent COPD, hypokalemia, dyspnea, HLD, presented with worsening of stroke symptoms (L sided weakness). 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  Clinical Impression  Patient presents with nausea, decreased LUE strength/incoordination and impaired mobility s/p above. Pt lives with spouse and was independent PTA. Today, pt tolerated bed mobility, transfers and gait training with supervision for safety. Noted to initially have some drifting and decreased arm swing with mobility but this improved with cues and increased distance. Tolerated higher level balance challenges as well without issues. Pt does not require skilled therapy services while in the hospital due to functioning at [place] I level. Encouraged walking to bathroom and using LUE functionally as able. Education re: [employer]. Discharge from therapy.    Follow Up Recommendations No PT follow up;Supervision - Intermittent    Equipment Recommendations  None recommended by PT    Recommendations for Other Services       Precautions / Restrictions Precautions Precautions: None Restrictions Weight Bearing Restrictions: No      Mobility  Bed Mobility Overal bed mobility: Needs Assistance Bed Mobility: Supine to Sit     Supine to sit: Modified independent (Device/Increase time)     General bed mobility comments: No assist needed.  Transfers Overall transfer level: Needs assistance Equipment used: None Transfers: Sit to/from Stand Sit to Stand: Supervision         General transfer comment: SUpervision for safety. Stood from [employer].  Ambulation/Gait Ambulation/Gait assistance: Supervision Gait  Distance (Feet): 250 Feet Assistive device: None Gait Pattern/deviations: Step-through pattern;Decreased stride length;Drifts right/left Gait velocity: decreased Gait velocity interpretation: 1.31 - 2.62 ft/sec, indicative of limited community ambulator General Gait Details: Slow, mostly steady gait wtih mild drifting noted; initially holding LUE across belly, cues for reciproal arm swing. Able to tolerate higher level balance challenges, see balance section.  Stairs            Wheelchair Mobility    Modified Rankin (Stroke Patients Only) Modified Rankin (Stroke Patients Only) Pre-Morbid Rankin Score: Slight disability Modified Rankin: Slight disability     Balance Overall balance assessment: Needs assistance Sitting-balance support: Feet supported;No upper extremity supported Sitting balance-Leahy Scale: Good     Standing balance support: During functional activity Standing balance-Leahy Scale: Good               High level balance activites: Head turns;Direction changes High Level Balance Comments: Able to perform above and step over objects in hallway without evidence of imbalance.             Pertinent Vitals/Pain Pain Assessment: No/denies pain (nausea)    Home Living Family/patient expects to be discharged to:: Private residence Living Arrangements: Spouse/significant other Available Help at Discharge: Family;Available PRN/intermittently Type of Home: House Home Access: Stairs to enter   [employer] of Steps: 1 tiny step Home Layout: One level Home Equipment: Shower seat - built in;Grab bars - tub/shower      Prior Function Level of Independence: Independent         Comments: Driving     Hand Dominance   Dominant Hand: Right    Extremity/Trunk Assessment   Upper Extremity Assessment Upper Extremity Assessment: Defer to OT evaluation;LUE deficits/detail LUE Deficits /  Details: Impaired coordination noted especially with FTN  nose; dysmetria present. LUE Sensation: decreased proprioception LUE Coordination: decreased fine motor;decreased gross motor    Lower Extremity Assessment Lower Extremity Assessment: Generalized weakness (but functional)    Cervical / Trunk Assessment Cervical / Trunk Assessment: Normal  Communication   Communication: Expressive difficulties (slow, per pt report)  Cognition Arousal/Alertness: Awake/alert Behavior During Therapy: WFL for tasks assessed/performed Overall Cognitive Status: Within Functional Limits for tasks assessed                                 General Comments: Pt A&Ox4. Follows commands well.      General Comments General comments (skin integrity, edema, etc.): Nausea upon arrival but pt RN gave meds and it resolved in session. Hungry.    Exercises     Assessment/Plan    PT Assessment Patent does not need any further PT services  PT Problem List Decreased strength;Decreased coordination       PT Treatment Interventions      PT Goals (Current goals can be found in the Care Plan section)  Acute Rehab PT Goals Patient Stated Goal: to eat something and go home PT Goal Formulation: All assessment and education complete, DC therapy    Frequency     Barriers to discharge        Co-evaluation               AM-PAC PT "6 Clicks" Mobility  Outcome Measure Help needed turning from your back to your side while in a flat bed without using bedrails?: None Help needed moving from lying on your back to sitting on the side of a flat bed without using bedrails?: None Help needed moving to and from a bed to a chair (including a wheelchair)?: None Help needed standing up from a chair using your arms (e.g., wheelchair or bedside chair)?: None Help needed to walk in hospital room?: None Help needed climbing 3-5 steps with a railing? : A Little 6 Click Score: 23    End of Session   Activity Tolerance: Patient tolerated treatment  well Patient left: in bed;with call bell/phone within reach Nurse Communication: Mobility status PT Visit Diagnosis: Difficulty in walking, not elsewhere classified (R26.2)    Time: 0712-1975 PT Time Calculation (min) (ACUTE ONLY): 26 min   Charges:   PT Evaluation $PT Eval Moderate Complexity: 1 Mod PT Treatments $Gait Training: 8-22 mins        Marisa Severin, PT, DPT Acute Rehabilitation Services Pager [phone] Office [phone]      Marguarite Arbour A [place] 09/28/2019, 12:06 PM

## 2019-09-28 NOTE — Evaluation (Signed)
Occupational Therapy Evaluation Patient Details Name: Danielle Fox MRN: [id number] DOB: [birth year] Today's Date: 09/28/2019    History of Present Illness Pt is a 69 y.o. female with PMH of recent stroke on 7/17, prediabetes, mild intermittent COPD, hypokalemia, dyspnea, HLD, presented with worsening of stroke symptoms (L sided weakness). 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   Clinical Impression   PTA pt reports being independent with ADLs and driving. Pt was admitted for above and treated for problem list below (see OT Problem List). Pt A&Ox4 needing cues for problem solving with opening containers with affected hand. Requires Supervision - Min A for ADLs due to decreased limb decreased coordination, decreased strength and decreased UE functional use. Requires supervision - min guard with transfers and ambulation due to decreased balance with line management. Believe pt would benefit from skilled-OT services acutely and at the outpatient level to increase functional use in LUE and increase independence.    Follow Up Recommendations  Outpatient OT    Equipment Recommendations  None recommended by OT       Precautions / Restrictions Precautions Precautions: Fall Restrictions Weight Bearing Restrictions: No      Mobility Bed Mobility Overal bed mobility: Needs Assistance Bed Mobility: Supine to Sit     Supine to sit: Supervision     General bed mobility comments: supervision for lines and safety  Transfers Overall transfer level: Needs assistance Equipment used: None Transfers: Sit to/from Stand Sit to Stand: Supervision;Min guard         General transfer comment: min guard with initial stand and supervision for sit for safety    Balance Overall balance assessment: Needs assistance Sitting-balance support: Feet supported;Feet unsupported;No upper extremity supported Sitting  balance-Leahy Scale: Good Sitting balance - Comments: supervision for safety   Standing balance support: No upper extremity supported;During functional activity Standing balance-Leahy Scale: Good Standing balance comment: supervision to min guard for safety                           ADL either performed or assessed with clinical judgement   ADL Overall ADL's : Needs assistance/impaired     Grooming: Minimal assistance;Standing;Cueing for sequencing;Oral care Grooming Details (indicate cue type and reason): Min A for starting to unscrew toothpaste tube Upper Body Bathing: Supervision/ safety;Cueing for compensatory techniques;Sitting;Standing Upper Body Bathing Details (indicate cue type and reason): Supervision for safety Lower Body Bathing: Sit to/from stand;Sitting/lateral leans;Supervison/ safety Lower Body Bathing Details (indicate cue type and reason): supervision for safety Upper Body Dressing : Sitting;Standing;Minimal assistance Upper Body Dressing Details (indicate cue type and reason): min A with fasteners Lower Body Dressing: Sitting/lateral leans;Sit to/from stand;Minimal assistance Lower Body Dressing Details (indicate cue type and reason): min A with fasteners able to reach feet to pull up socks Toilet Transfer: Supervision/safety;Ambulation;Regular Glass blower/designer Details (indicate cue type and reason): supervision for safety Toileting- Clothing Manipulation and Hygiene: Supervision/safety;Sitting/lateral lean;Sit to/from stand Toileting - Clothing Manipulation Details (indicate cue type and reason): supervision for safety Tub/ Shower Transfer: Supervision/safety;Ambulation;Walk-in Lobbyist Details (indicate cue type and reason): supervision for safety Functional mobility during ADLs: Supervision/safety;Min guard;Cueing for sequencing General ADL Comments: supervision-min guard due to line management     Vision Baseline  Vision/History: Wears glasses Wears Glasses: Reading only Patient Visual Report: No change from baseline Vision Assessment?: No apparent visual deficits  Pertinent Vitals/Pain Pain Assessment: No/denies pain     Hand Dominance Right   Extremity/Trunk Assessment Upper Extremity Assessment Upper Extremity Assessment: LUE deficits/detail LUE Deficits / Details: decreased strength and FM/GM coordination - sensation in tact. 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 Decreased proprioception with dropping objects at the sink and ataxic movements with finger to nose LUE Sensation: decreased proprioception LUE Coordination: decreased fine motor;decreased gross motor   Lower Extremity Assessment Lower Extremity Assessment: Defer to PT evaluation       Communication Communication Communication: Expressive difficulties (slow)   Cognition Arousal/Alertness: Awake/alert Behavior During Therapy: WFL for tasks assessed/performed Overall Cognitive Status: Within Functional Limits for tasks assessed                                 General Comments: Pt A&Ox4   General Comments  Pt reports she has been up with nausea             Home Living Family/patient expects to be discharged to:: Private residence Living Arrangements: Spouse/significant other Available Help at Discharge: Family;Available PRN/intermittently Type of Home: House Home Access: Stairs to enter [employer] of Steps: 1 tiny step   Home Layout: One level     Bathroom Shower/Tub: Occupational psychologist: Standard     Home Equipment: Shower seat - built in;Grab bars - tub/shower          Prior Functioning/Environment Level of Independence: Independent        Comments: Driving        OT Problem List: Decreased strength;Impaired balance (sitting and/or standing);Decreased coordination;Decreased knowledge of precautions;Impaired UE  functional use      OT Treatment/Interventions: Self-care/ADL training;Neuromuscular education;Therapeutic activities;Patient/family education;Balance training    OT Goals(Current goals can be found in the care plan section) Acute Rehab OT Goals Patient Stated Goal: go home OT Goal Formulation: With patient Time For Goal Achievement: 10/12/19 Potential to Achieve Goals: Good  OT Frequency: Min 2X/week    AM-PAC OT "6 Clicks" Daily Activity     Outcome Measure Help from another person eating meals?: None Help from another person taking care of personal grooming?: A Little Help from another person toileting, which includes using toliet, bedpan, or urinal?: A Little Help from another person bathing (including washing, rinsing, drying)?: A Little Help from another person to put on and taking off regular upper body clothing?: A Little Help from another person to put on and taking off regular lower body clothing?: A Little 6 Click Score: 19   End of Session Equipment Utilized During Treatment: Gait belt Nurse Communication: Mobility status  Activity Tolerance: Patient tolerated treatment well Patient left: in bed;with bed alarm set;with call bell/phone within reach  OT Visit Diagnosis: Muscle weakness (generalized) (M62.81);Ataxia, unspecified (R27.0);Other symptoms and signs involving the nervous system (R29.898);Unsteadiness on feet (R26.81)                Time: 7048-8891 OT Time Calculation (min): 14 min Charges:  OT General Charges $OT Visit: 1 Visit OT Evaluation $OT Eval Low Complexity: 1 Low  Dymond Spreen/OTS  Hillis Mcphatter 09/28/2019, 8:50 AM

## 2019-09-29 ENCOUNTER — Inpatient Hospital Stay (HOSPITAL_COMMUNITY): Payer: Medicare Other

## 2019-09-29 DIAGNOSIS — G039 Meningitis, unspecified: Secondary | ICD-10-CM

## 2019-09-29 NOTE — Consult Note (Signed)
[facility] for Infectious Disease  Total days of antibiotics 0               Reason for Consult: intermittent left sided weakness and abn LP   Referring Physician: Linzen  Active Problems:   CVA (cerebral vascular accident) [facility])    HPI: Danielle Fox is a 69 y.o. female presents with worsening left sided facial weakness and left upper extremity weakness on 7/29. She was recently diagnosed with stroke like symptoms similar presentation roughly 3 weeks ago. She was diagnosed with stroke on 7/17 after she had new onset of left arm weakness/uncoordination while she was traveling in [place]. She did undergo imaging which the  MRI showed subtle enhancement on the right posterior frontal lobe and anterior right parietal lobe which favored subacute cortical infarct.  She also noticed left sided facial asymmetry. Since having a stroke she has noticed improvement in her symptoms.never affected her gait nor her speech. However, she noticed that it acutely worsened and came to the emergency room on 7/29 for evaluation. Repeat MRI showed abnormal FLAR signal right frontal convextiy and also some abn in left cingulate and left parieto occipital junction, not new ischemic like pattern, concern for possible rheumatoid meningitis or indolent meningitis though no fever, neck pain, no meningeal enhancement, no encephalopathy. She underwent LP that showed WBC of 43, slightly elevated segmented cells of 19%, glu 42, prot 50. Gram stain negative, AFB smear negative. cx negative. Pending tests . Neurology asked ID to weigh in whether this could be indolent fungal infection. The patient no hx of travel to endemic tb areas, has not worked with TB patients, jails, homeless population. No significant gardening. She stopped smoking 5 years ago.   Reviewing chest CT has bilateral apical emphysema changes, nodule on the right apical area per my read- unchange from 11/20   Past Medical History:  Diagnosis Date  .  Allergy   . Clostridioides difficile infection 10/19/2018   tested 10/2018 negative  . Colon polyps   . Constipation 10/06/2014  . COPD (chronic obstructive pulmonary disease) ([place])   . Osteopenia   . 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 Preventative health care 06/29/2016  . Pruritus 06/29/2016  . Welcome to Medicare preventive visit 06/29/2016    Allergies:  Allergies  Allergen Reactions  . Doxycycline Nausea And Vomiting  . Erythromycin Base     Other reaction(s): GI Upset (intolerance)    Current antibiotics:   MEDICATIONS: . aspirin EC  81 mg Oral Daily  . atorvastatin  40 mg Oral QHS  . calcium carbonate  1 tablet Oral Q breakfast  . clopidogrel  75 mg Oral Daily  . enoxaparin (LOVENOX) injection  40 mg Subcutaneous Q24H  . feeding supplement (ENSURE ENLIVE)  237 mL Oral BID BM  . fluticasone furoate-vilanterol  1 puff Inhalation Daily  . pantoprazole  40 mg Oral Daily    Social History   Tobacco Use  . Smoking status: Former Smoker    Packs/day: 1.00    Years: 48.00    Pack years: 48.00    Types: Cigarettes    Quit date: 02/19/2014    Years since quitting: 5.6  . Smokeless tobacco: Never Used  . Tobacco comment: Quit and has no desire to smoke again,  Vaping Use  . Vaping Use: Never used  Substance Use Topics  . Alcohol use: Yes    Alcohol/week: 0.0 standard drinks    Comment:  socially  . Drug use: No    Family History  Problem Relation Age of Onset  . Heart disease Mother        cabg, MI  . Alcohol abuse Father   . Cirrhosis Father   . Heart disease Father        MI  . Colon cancer Neg Hx   . Colon polyps Neg Hx   . Esophageal cancer Neg Hx   . Stomach cancer Neg Hx   . Rectal cancer Neg Hx      Review of Systems  Constitutional: Negative for fever, chills, diaphoresis, activity change, appetite change, fatigue and unexpected weight change.  HENT: Negative for congestion, sore throat, rhinorrhea, sneezing, trouble swallowing and sinus  pressure.  Eyes: Negative for photophobia and visual disturbance.  Respiratory: Negative for cough, chest tightness, shortness of breath, wheezing and stridor.  Cardiovascular: Negative for chest pain, palpitations and leg swelling.  Gastrointestinal: Negative for nausea, vomiting, abdominal pain, diarrhea, constipation, blood in stool, abdominal distention and anal bleeding.  Genitourinary: Negative for dysuria, hematuria, flank pain and difficulty urinating.  Musculoskeletal: Negative for myalgias, back pain, joint swelling, arthralgias and gait problem.  Skin: Negative for color change, pallor, rash and wound.  Neurological: Negative for dizziness, tremors, weakness and light-headedness. Weakness per hpi  Hematological: Negative for adenopathy. Does not bruise/bleed easily.  Psychiatric/Behavioral: Negative for behavioral problems, confusion, sleep disturbance, dysphoric mood, decreased concentration and agitation.     OBJECTIVE: Temp:  [97.7 F (36.5 C)-99.6 F (37.6 C)] 97.7 F (36.5 C) (07/31 0736) Pulse Rate:  [72-96] 72 (07/31 0736) Resp:  [16-20] 16 (07/31 0736) BP: (108-124)/(49-61) 108/60 (07/31 0736) SpO2:  [96 %-98 %] 98 % (07/31 0736) Physical Exam  Constitutional:  oriented to person, place, and time. appears well-developed and well-nourished. No distress.  HENT: [place]/AT, PERRLA, no scleral icterus Mouth/Throat: Oropharynx is clear and moist. No oropharyngeal exudate.  Cardiovascular: Normal rate, regular rhythm and normal heart sounds. Exam reveals no gallop and no friction rub.  No murmur heard.  Pulmonary/Chest: Effort normal and breath sounds normal. No respiratory distress.  has no wheezes.  Neck = supple, no nuchal rigidity Abdominal: Soft. Bowel sounds are normal.  exhibits no distension. There is no tenderness.  Lymphadenopathy: no cervical adenopathy. No axillary adenopathy Neurological: alert and oriented to person, place, and time. CN2-12 intact. Very subtle  loss of left nasal crease when smiling. Finger to nose evaluation slower and slight asymmetry on left vs right. Strength intact Skin: Skin is warm and dry. No rash noted. No erythema.  Psychiatric: a normal mood and affect.  behavior is normal.    LABS: Results for orders placed or performed during the hospital encounter of 09/27/19 (from the past 48 hour(s))  Ethanol     Status: None   Collection Time: 09/27/19  2:23 PM  Result Value Ref Range   Alcohol, Ethyl (B) <10 <10 mg/dL    Comment: (NOTE) Lowest detectable limit for serum alcohol is 10 mg/dL.  For medical purposes only. Performed at [facility], [place] [address], [place], [place] [address]   Protime-INR     Status: None   Collection Time: 09/27/19  2:23 PM  Result Value Ref Range   Prothrombin Time 13.1 11.4 - 15.2 seconds   INR 1.0 0.8 - 1.2    Comment: (NOTE) INR goal varies based on device and disease states. Performed at [facility], [place] [address], [place], [place] [address]   APTT  Status: None   Collection Time: 09/27/19  2:23 PM  Result Value Ref Range   aPTT 34 24 - 36 seconds    Comment: Performed at [facility], [place] [address], [place], [place] [address]  CBC     Status: Abnormal   Collection Time: 09/27/19  2:23 PM  Result Value Ref Range   WBC 11.6 (H) 4.0 - 10.5 K/uL   RBC 4.73 3.87 - 5.11 MIL/uL   Hemoglobin 12.3 12.0 - 15.0 g/dL   HCT 39.5 36 - 46 %   MCV 83.5 80.0 - 100.0 fL   MCH 26.0 26.0 - 34.0 pg   MCHC 31.1 30.0 - 36.0 g/dL   RDW 13.9 11.5 - 15.5 %   Platelets 412 (H) 150 - 400 K/uL   nRBC 0.0 0.0 - 0.2 %    Comment: Performed at [facility] [address] [place], [place] [address]  Differential     Status: Abnormal   Collection Time: 09/27/19  2:23 PM  Result Value Ref Range   Neutrophils Relative % 81 %   Neutro Abs 9.4 (H) 1.7 - 7.7 K/uL   Lymphocytes Relative 9 %   Lymphs Abs 1.0 0.7 - 4.0 K/uL   Monocytes Relative 9 %   Monocytes Absolute 1.1  (H) 0 - 1 K/uL   Eosinophils Relative 0 %   Eosinophils Absolute 0.0 0 - 0 K/uL   Basophils Relative 0 %   Basophils Absolute 0.1 0 - 0 K/uL   Immature Granulocytes 1 %   Abs Immature Granulocytes 0.06 0.00 - 0.07 K/uL    Comment: Performed at [facility] [address], [place], [place] [address]  Comprehensive metabolic panel     Status: Abnormal   Collection Time: 09/27/19  2:23 PM  Result Value Ref Range   Sodium 135 135 - 145 mmol/L   Potassium 4.2 3.5 - 5.1 mmol/L   Chloride 100 98 - 111 mmol/L   CO2 23 22 - 32 mmol/L   Glucose, Bld 90 70 - 99 mg/dL    Comment: Glucose reference range applies only to samples taken after fasting for at least 8 hours.   BUN 6 (L) 8 - 23 mg/dL   Creatinine, Ser 0.83 0.44 - 1.00 mg/dL   Calcium 9.4 8.9 - 10.3 mg/dL   Total Protein 7.1 6.5 - 8.1 g/dL   Albumin 3.3 (L) 3.5 - 5.0 g/dL   AST 16 15 - 41 U/L   ALT 15 0 - 44 U/L   Alkaline Phosphatase 79 38 - 126 U/L   Total Bilirubin 0.6 0.3 - 1.2 mg/dL   GFR calc non Af Amer >60 >60 mL/min   GFR calc Af Amer >60 >60 mL/min   Anion gap 12 5 - 15    Comment: Performed at [facility], [place] [address] [place], [place] [address]  I-stat chem 8, ED     Status: Abnormal   Collection Time: 09/27/19  2:24 PM  Result Value Ref Range   Sodium 135 135 - 145 mmol/L   Potassium 4.1 3.5 - 5.1 mmol/L   Chloride 99 98 - 111 mmol/L   BUN 6 (L) 8 - 23 mg/dL   Creatinine, Ser 0.70 0.44 - 1.00 mg/dL   Glucose, Bld 89 70 - 99 mg/dL    Comment: Glucose reference range applies only to samples taken after fasting for at least 8 hours.   Calcium, Ion 1.12 (L) 1.15 -  1.40 mmol/L   TCO2 25 22 - 32 mmol/L   Hemoglobin 13.3 12.0 - 15.0 g/dL   HCT 39.0 36 - 46 %  HIV Antibody (routine testing w rflx)     Status: None   Collection Time: 09/27/19  4:44 PM  Result Value Ref Range   HIV Screen 4th Generation wRfx Non Reactive Non Reactive    Comment: Performed at [facility], [place] [address],  [place], [place] [address]  VITAMIN D 25 Hydroxy (Vit-D Deficiency, Fractures)     Status: Abnormal   Collection Time: 09/27/19  4:44 PM  Result Value Ref Range   Vit D, 25-Hydroxy 18.27 (L) 30 - 100 ng/mL    Comment: (NOTE) Vitamin D deficiency has been defined by the Institute of Medicine  and an Endocrine Society practice guideline as a level of serum 25-OH  vitamin D less than 20 ng/mL (1,2). The Endocrine Society went on to  further define vitamin D insufficiency as a level between 21 and 29  ng/mL (2).  1. IOM (Institute of Medicine). 2010. Dietary reference intakes for  calcium and D. [place]: The [employer]. 2. Holick MF, Binkley Post, Bischoff-Ferrari HA, et al. Evaluation,  treatment, and prevention of vitamin D deficiency: an Endocrine  Society clinical practice guideline, JCEM. 2011 Jul; 96(7): 1911-30.  Performed at [facility], [place] [address] [place], [place] [address]   Sedimentation rate     Status: Abnormal   Collection Time: 09/27/19  4:44 PM  Result Value Ref Range   Sed Rate 55 (H) 0 - 22 mm/hr    Comment: Performed at [facility] [address], [place],  [address]  Urine rapid drug screen (hosp performed)     Status: None   Collection Time: 09/27/19  6:00 PM  Result Value Ref Range   Opiates NONE DETECTED NONE DETECTED   Cocaine NONE DETECTED NONE DETECTED   Benzodiazepines NONE DETECTED NONE DETECTED   Amphetamines NONE DETECTED NONE DETECTED   Tetrahydrocannabinol NONE DETECTED NONE DETECTED   Barbiturates NONE DETECTED NONE DETECTED    Comment: (NOTE) DRUG SCREEN FOR MEDICAL PURPOSES ONLY.  IF CONFIRMATION IS NEEDED FOR ANY PURPOSE, NOTIFY LAB WITHIN 5 DAYS.  LOWEST DETECTABLE LIMITS FOR URINE DRUG SCREEN Drug Class                     Cutoff (ng/mL) Amphetamine and metabolites    1000 Barbiturate and metabolites    200 Benzodiazepine                 601 Tricyclics and metabolites     300 Opiates and  metabolites        300 Cocaine and metabolites        300 THC                            50 Performed at [facility], [place] [address] [place],  [address]   Urinalysis, Routine w reflex microscopic     Status: Abnormal   Collection Time: 09/27/19  6:00 PM  Result Value Ref Range   Color, Urine YELLOW YELLOW   APPearance CLEAR CLEAR   Specific Gravity, Urine >1.046 (H) 1.005 - 1.030   pH 6.0 5.0 - 8.0   Glucose, UA NEGATIVE NEGATIVE mg/dL   Hgb urine dipstick NEGATIVE NEGATIVE   Bilirubin Urine NEGATIVE NEGATIVE   Ketones, ur  20 (A) NEGATIVE mg/dL   Protein, ur NEGATIVE NEGATIVE mg/dL   Nitrite NEGATIVE NEGATIVE   Leukocytes,Ua TRACE (A) NEGATIVE   RBC / HPF 0-5 0 - 5 RBC/hpf   WBC, UA 0-5 0 - 5 WBC/hpf   Bacteria, UA NONE SEEN NONE SEEN   Squamous Epithelial / LPF 0-5 0 - 5    Comment: Performed at [facility], [place] [address], [place], [place] [address]  SARS Coronavirus 2 by RT PCR (hospital order, performed in [facility] hospital lab) Nasopharyngeal Nasopharyngeal Swab     Status: None   Collection Time: 09/27/19  8:11 PM   Specimen: Nasopharyngeal Swab  Result Value Ref Range   SARS Coronavirus 2 NEGATIVE NEGATIVE    Comment: (NOTE) SARS-CoV-2 target nucleic acids are NOT DETECTED.  The SARS-CoV-2 RNA is generally detectable in upper and lower respiratory specimens during the acute phase of infection. The lowest concentration of SARS-CoV-2 viral copies this assay can detect is 250 copies / mL. A negative result does not preclude SARS-CoV-2 infection and should not be used as the sole basis for treatment or other patient management decisions.  A negative result may occur with improper specimen collection / handling, submission of specimen other than nasopharyngeal swab, presence of viral mutation(s) within the areas targeted by this assay, and inadequate number of viral copies (<250 copies / mL). A negative result must be combined with  clinical observations, patient history, and epidemiological information.  Fact Sheet for Patients:   StrictlyIdeas.no  Fact Sheet for Healthcare Providers: BankingDealers.co.za  This test is not yet approved or  cleared by the [place] FDA and has been authorized for detection and/or diagnosis of SARS-CoV-2 by FDA under an Emergency Use Authorization (EUA).  This EUA will remain in effect (meaning this test can be used) for the duration of the COVID-19 declaration under Section 564(b)(1) of the Act, 21 U.S.C. section 360bbb-3(b)(1), unless the authorization is terminated or revoked sooner.  Performed at [facility], [place] [address] [place], [place] [address]   Hemoglobin A1c     Status: Abnormal   Collection Time: 09/28/19  3:16 AM  Result Value Ref Range   Hgb A1c MFr Bld 5.9 (H) 4.8 - 5.6 %    Comment: (NOTE) Pre diabetes:          5.7%-6.4%  Diabetes:              >6.4%  Glycemic control for   <7.0% adults with diabetes    Mean Plasma Glucose 122.63 mg/dL    Comment: Performed at [facility] [address], [place], [place] [address]  Lipid panel     Status: None   Collection Time: 09/28/19  3:16 AM  Result Value Ref Range   Cholesterol 121 0 - 200 mg/dL   Triglycerides 70 <150 mg/dL   HDL 47 >40 mg/dL   Total CHOL/HDL Ratio 2.6 RATIO   VLDL 14 0 - 40 mg/dL   LDL Cholesterol 60 0 - 99 mg/dL    Comment:        Total Cholesterol/HDL:CHD Risk Coronary Heart Disease Risk Table                     Men   Women  1/2 Average Risk   3.4   3.3  Average Risk       5.0   4.4  2 X Average Risk   9.6   7.1  3 X  Average Risk  23.4   11.0        Use the calculated Patient Ratio above and the CHD Risk Table to determine the patient's CHD Risk.        ATP III CLASSIFICATION (LDL):  <100     mg/dL   Optimal  100-129  mg/dL   Near or Above                    Optimal  130-159  mg/dL   Borderline  160-189   mg/dL   High  >190     mg/dL   Very High Performed at [facility] [address], [place], [place] [address]   Glucose, CSF     Status: None   Collection Time: 09/28/19  5:06 PM  Result Value Ref Range   Glucose, CSF 42 40 - 70 mg/dL    Comment: Performed at [facility] [address], [place], [place] [address]  Protein, CSF     Status: Abnormal   Collection Time: 09/28/19  5:06 PM  Result Value Ref Range   Total  Protein, CSF 50 (H) 15 - 45 mg/dL    Comment: Performed at [facility] [address], [place], [place] [address]  CSF cell count with differential     Status: Abnormal   Collection Time: 09/28/19  5:06 PM  Result Value Ref Range   Tube # 4    Color, CSF COLORLESS COLORLESS   Appearance, CSF CLEAR CLEAR   Supernatant NOT INDICATED    RBC Count, CSF 3 (H) 0 /cu mm   WBC, CSF 43 (HH) 0 - 5 /cu mm    Comment: CRITICAL RESULT CALLED TO, READ BACK BY AND VERIFIED WITH: L.RUDD RN 1853 09/28/19 MCCORMICK K    Segmented Neutrophils-CSF 19 (H) 0 - 6 %   Lymphs, CSF 55 40 - 80 %   Monocyte-Macrophage-Spinal Fluid 26 15 - 45 %   Eosinophils, CSF 0 0 - 1 %    Comment: Performed at [facility] [address], [place], [place] [address]  CSF culture     Status: None (Preliminary result)   Collection Time: 09/28/19  5:06 PM   Specimen: PATH Cytology CSF; Cerebrospinal Fluid  Result Value Ref Range   Specimen Description CSF    Special Requests NONE    Gram Stain      WBC PRESENT, PREDOMINANTLY PMN NO ORGANISMS SEEN CYTOSPIN SMEAR Performed at [facility] [address] [place], [place] [address]    Culture PENDING    Report Status PENDING   Cryptococcal antigen, CSF     Status: None   Collection Time: 09/28/19  5:06 PM  Result Value Ref Range   Crypto Ag NEGATIVE NEGATIVE   Cryptococcal Ag Titer NOT INDICATED NOT INDICATED    Comment: Performed at [facility], [place] [address] [place],  [address]     MICRO:  IMAGING: EEG  Result Date: 09/28/2019 Lora Havens, MD     09/28/2019  1:09 PM Patient Name: Danielle Fox MRN: [id number] Epilepsy Attending: Lora Havens Referring Physician/Provider: Dr Kerney Elbe Date: 09/28/2019 Duration: 23.36 mins Patient history: 69 y.o.femalewitha recentpresentation at an OSH in [place] for what was diagnosed as a stroke (08/2019) in the context of atypical imaging appearance on MRI, who re-presented [place] ED on Thursday as a code stroke for left sidedweakness and facial droop,worsened relative to her recent  newbaseline. EEG to evaluate for seizure. Level of alertness: Awake, drowsy AEDs during EEG study: None Technical aspects: This EEG study was done with scalp electrodes positioned according to the 10-20 International system of electrode placement. Electrical activity was acquired at a sampling rate of 500Hz  and reviewed with a high frequency filter of 70Hz  and a low frequency filter of 1Hz . EEG data were recorded continuously and digitally stored. Description: The posterior dominant rhythm consists of 9 Hz activity of moderate voltage (25-35 uV) seen predominantly in posterior head regions, symmetric and reactive to eye opening and eye closing. Drowsiness was characterized by attenuation of the posterior background rhythm. EEG showed intermittent 3 to 6 Hz theta-delta slowing in right hemisphere.  Hyperventilation and photic stimulation were not performed.   ABNORMALITY -Intermittent slow, right hemisphere IMPRESSION: This study is suggestive of cortical dysfunction in right hemisphere nonspecific etiology but likely related to underlying cortical abnormality. No seizures or epileptiform discharges were seen throughout the recording. Lora Havens   CT Code Stroke CTA Head W/WO contrast  Result Date: 09/27/2019 CLINICAL DATA:  Left-sided weakness EXAM: CT ANGIOGRAPHY HEAD AND NECK TECHNIQUE: Multidetector CT imaging of the head and neck  was performed using the standard protocol during bolus administration of intravenous contrast. Multiplanar CT image reconstructions and MIPs were obtained to evaluate the vascular anatomy. Carotid stenosis measurements (when applicable) are obtained utilizing NASCET criteria, using the distal internal carotid diameter as the denominator. CONTRAST:  38mL OMNIPAQUE IOHEXOL 350 MG/ML SOLN COMPARISON:  None. FINDINGS: CTA NECK Aortic arch: Mild plaque along the aortic arch. Great vessel origins are patent. There is eccentric primarily noncalcified plaque at the left subclavian origin causing approximately 60% stenosis. Ulcerated plaque is also noted. Right carotid system: Patent. Minimal calcified plaque at the ICA origin without measurable stenosis. Left carotid system: Patent. Mild calcified plaque at the ICA origin without measurable stenosis. Vertebral arteries: Patent.  Codominant.  No measurable stenosis. Skeleton: Degenerative changes of the cervical spine. Other neck: No mass or adenopathy. Upper chest: No apical lung mass. Review of the MIP images confirms the above findings CTA HEAD Anterior circulation: Intracranial internal carotid arteries are patent. Anterior and middle cerebral arteries are patent. Posterior circulation: Intracranial vertebral arteries and PICA origins are patent. Basilar artery is patent. Superior cerebellar artery origins are patent. Patent posterior cerebral arteries. Venous sinuses: Patent as allowed by contrast bolus timing. Review of the MIP images confirms the above findings IMPRESSION: No large vessel occlusion. 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 Plaque ulceration is also noted. Electronically Signed   By: Macy Mis M.D.   On: 09/27/2019 14:52   CT Code Stroke CTA Neck W/WO contrast  Result Date: 09/27/2019 CLINICAL DATA:  Left-sided weakness EXAM: CT ANGIOGRAPHY HEAD AND NECK TECHNIQUE: Multidetector CT imaging  of the head and neck was performed using the standard protocol during bolus administration of intravenous contrast. Multiplanar CT image reconstructions and MIPs were obtained to evaluate the vascular anatomy. Carotid stenosis measurements (when applicable) are obtained utilizing NASCET criteria, using the distal internal carotid diameter as the denominator. CONTRAST:  87mL OMNIPAQUE IOHEXOL 350 MG/ML SOLN COMPARISON:  None. FINDINGS: CTA NECK Aortic arch: Mild plaque along the aortic arch. Great vessel origins are patent. There is eccentric primarily noncalcified plaque at the left subclavian origin causing approximately 60% stenosis. Ulcerated plaque is also noted. Right carotid system: Patent. Minimal calcified plaque at the ICA origin without measurable stenosis. Left carotid system: Patent. Mild calcified  plaque at the ICA origin without measurable stenosis. Vertebral arteries: Patent.  Codominant.  No measurable stenosis. Skeleton: Degenerative changes of the cervical spine. Other neck: No mass or adenopathy. Upper chest: No apical lung mass. Review of the MIP images confirms the above findings CTA HEAD Anterior circulation: Intracranial internal carotid arteries are patent. Anterior and middle cerebral arteries are patent. Posterior circulation: Intracranial vertebral arteries and PICA origins are patent. Basilar artery is patent. Superior cerebellar artery origins are patent. Patent posterior cerebral arteries. Venous sinuses: Patent as allowed by contrast bolus timing. Review of the MIP images confirms the above findings IMPRESSION: No large vessel occlusion. 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 Plaque ulceration is also noted. Electronically Signed   By: Macy Mis M.D.   On: 09/27/2019 14:52   MR BRAIN WO CONTRAST  Result Date: 09/27/2019 CLINICAL DATA:  69 year old female left side weakness code stroke presentation today. EXAM: MRI HEAD  WITHOUT CONTRAST TECHNIQUE: Multiplanar, multiecho pulse sequences of the brain and surrounding structures were obtained without intravenous contrast. COMPARISON:  CTA head and neck, CT head earlier today. Brain MRI [facility] 09/22/2019. Report of an outside brain MRI without and with contrast 09/15/2019. FINDINGS: Brain: No parenchymal restricted or facilitated diffusion is identified. However, there may be subtle a abnormal diffusion along the surface of the right superior frontal lobe (series 7, image 50). And there is persistent abnormal appearance of the superior right frontal lobe and also contralateral left cingulate sulci on FLAIR imaging with hyperintensity (series 11, image 20). But no gyral edema or encephalomalacia is evident. SWI imaging is unremarkable, and noncontrast CT today demonstrated only subtle asymmetric effacement of the sulci. Questionable similar but less pronounced FLAIR abnormality along the left inferior parietal/occipital junction on series 11, image 14 today, and stable from 09/22/2019. No similar FLAIR abnormality elsewhere. No intraventricular debris. Basilar cisterns appear normal. No midline shift, mass effect, evidence of mass lesion, ventriculomegaly, extra-axial collection or acute intracranial hemorrhage. Cervicomedullary junction and pituitary are within normal limits. Vascular: Major intracranial vascular flow voids are stable. Skull and upper cervical spine: Negative visible cervical spine. Visualized bone marrow signal is within normal limits. Sinuses/Orbits: Stable, negative. Other: Mastoids remain clear.  Scalp and face appear negative. IMPRESSION: 1. Persistent abnormal FLAIR signal in the sulci over the superior right frontal convexity, first reported on 09/15/2019 and reportedly with abnormal enhancement at that time. But no evidence of developing encephalomalacia, no evidence of blood products on CT or MRI, and no convincing gyral/parenchymal  signal or diffusion abnormality (although questionable abnormal leptomeningeal appearance there on DWI). Similar abnormal sulci in the left cingulate and at the left parieto-occipital junction. But other CSF spaces appear normal. 2. The etiology is unclear and CSF analysis is recommended. 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 Electronically Signed   By: Genevie Ann M.D.   On: 09/27/2019 18:49   ECHOCARDIOGRAM COMPLETE  Result Date: 09/28/2019    ECHOCARDIOGRAM REPORT   Patient Name:   Danielle Fox Date of Exam: 09/27/2019 Medical Rec #:  [id number]         Height:       61.0 in Accession #:    [id number]        Weight:       117.4 lb Date of Birth:  1950/03/24          BSA:  1.506 m Patient Age:    77 years          BP:           121/56 mmHg Patient Gender: F                 HR:           91 bpm. Exam Location:  Inpatient Procedure: 2D Echo Indications:    TIA  History:        Patient has prior history of Echocardiogram examinations, most                 recent 11/11/2017. COPD; Risk Factors:Former Smoker.  Sonographer:    Jannett Celestine RDCS (AE) Referring Phys: [id number] Lequita Halt  Sonographer Comments: Suboptimal parasternal window. off axis apical windows IMPRESSIONS  1. Left ventricular ejection fraction, by estimation, is 60 to 65%. The left ventricle has normal function. The left ventricle has no regional wall motion abnormalities. Left ventricular diastolic parameters were normal.  2. Right ventricular systolic function is normal. The right ventricular size is normal. Tricuspid regurgitation signal is inadequate for assessing PA pressure.  3. The mitral valve is normal in structure. No evidence of mitral valve regurgitation.  4. The aortic valve was not well visualized. Aortic valve regurgitation is not visualized. No aortic stenosis is present. FINDINGS  Left Ventricle: Left ventricular ejection fraction, by  estimation, is 60 to 65%. The left ventricle has normal function. The left ventricle has no regional wall motion abnormalities. The left ventricular internal cavity size was normal in size. There is  no left ventricular hypertrophy. Left ventricular diastolic parameters were normal. Right Ventricle: The right ventricular size is normal. No increase in right ventricular wall thickness. Right ventricular systolic function is normal. Tricuspid regurgitation signal is inadequate for assessing PA pressure. Left Atrium: Left atrial size was normal in size. Right Atrium: Right atrial size was not well visualized. Pericardium: There is no evidence of pericardial effusion. Mitral Valve: The mitral valve is normal in structure. No evidence of mitral valve regurgitation. Tricuspid Valve: The tricuspid valve is grossly normal. Tricuspid valve regurgitation is trivial. Aortic Valve: The aortic valve was not well visualized. Aortic valve regurgitation is not visualized. No aortic stenosis is present. Pulmonic Valve: The pulmonic valve was not well visualized. Pulmonic valve regurgitation is not visualized. Aorta: The aortic root was not well visualized. IAS/Shunts: The interatrial septum was not well visualized.  LEFT VENTRICLE PLAX 2D LVIDd:         3.50 cm Diastology LVIDs:         2.30 cm LV e' lateral:   9.68 cm/s LV PW:         1.00 cm LV E/e' lateral: 6.1 LV IVS:        0.80 cm  LEFT ATRIUM           Index LA diam:      2.60 cm 1.73 cm/m LA Vol (A2C): 19.5 ml 12.95 ml/m  AORTIC VALVE LVOT Vmax:   62.90 cm/s LVOT Vmean:  44.100 cm/s LVOT VTI:    0.126 m MITRAL VALVE MV Area (PHT): 3.72 cm    SHUNTS MV Decel Time: 204 msec    Systemic VTI: 0.13 m MV E velocity: 58.90 cm/s MV A velocity: 55.50 cm/s MV E/A ratio:  1.06 Oswaldo Milian MD Electronically signed by Oswaldo Milian MD Signature Date/Time: 09/28/2019/12:08:03 AM    Final    CT HEAD CODE STROKE WO CONTRAST  Result  Date: 09/27/2019 CLINICAL DATA:  Code  stroke.  Left-sided weakness EXAM: CT HEAD WITHOUT CONTRAST TECHNIQUE: Contiguous axial images were obtained from the base of the skull through the vertex without intravenous contrast. COMPARISON:  CT head 09/17/2019 FINDINGS: Brain: No acute intracranial hemorrhage, mass effect, or edema. Gray-white differentiation remains preserved. Ventricles are stable in size. No extra-axial fluid collection. Vascular: No hyperdense vessel. Skull: Unremarkable. Sinuses/Orbits: No acute abnormality. Other: Mastoid air cells are clear. ASPECTS ([place] Stroke Program Early CT Score) - Ganglionic level infarction (caudate, lentiform nuclei, internal capsule, insula, M1-M3 cortex): 7 - Supraganglionic infarction (M4-M6 cortex): 3 Total score (0-10 with 10 being normal): 10 IMPRESSION: No acute intracranial hemorrhage or evidence acute infarction. ASPECT score is 10. These results were communicated to Dr. Cheral Marker at 2:32 pmon 7/29/2021by text page via the [facility] messaging system. Electronically Signed   By: Macy Mis M.D.   On: 09/27/2019 14:36   DG FLUORO GUIDE LUMBAR PUNCTURE  Result Date: 09/28/2019 CLINICAL DATA:  Left-sided weakness, abnormal MRI EXAM: DIAGNOSTIC LUMBAR PUNCTURE UNDER FLUOROSCOPIC GUIDANCE FLUOROSCOPY TIME:  Fluoroscopy Time:  18 seconds Radiation Exposure Index (if provided by the fluoroscopic device): 1.4 mGy PROCEDURE: Informed consent was obtained from the patient prior to the procedure, including potential complications of headache, allergy, and pain. With the patient prone, the lower back was prepped with Betadine. 1% Lidocaine was used for local anesthesia. Lumbar puncture was performed at the L3-L4 level using a 22 gauge needle with return of clear CSF. 13 ml of CSF were obtained for laboratory studies. The patient tolerated the procedure well and there were no apparent complications. IMPRESSION: Technically successful fluoroscopic guided lumbar puncture. Electronically Signed   By: Macy Mis M.D.   On: 09/28/2019 17:24     Assessment/Plan:  Mild neutrophilic predominant pleocytosis, sed rate elevated to 50, no hx of immunocompromised host, in female with intermittent left sided facial and left arm coordination. Imaging doesn't necessarily show infarcts and concern for other processes be it auto-immune related, indolent infection such as fungal or mTB. Her cx are NGTD at nearly 36hr. She has no RF to suggest invasive fungal infection and lesions to be in new regions not previously noted on prior imaging. I have low suspicion for indolent infection.  Recommend to wait til tomorrow morning to start steroids if it is believed that could possibly help intermittent exacerbation Will continue to follow up with her csf culture Recommend to get quantiferon gold  Davonte Siebenaler B[facility] [phone]

## 2019-09-29 NOTE — Progress Notes (Signed)
PROGRESS NOTE    TYFFANI FOGLESONG  KDX:[id number] DOB: May 21, 1950 DOA: 09/27/2019 PCP: Mosie Lukes, MD   Brief Narrative:  Danielle Fox is a 69 y.o. female with medical history significant of recent stroke on 7/17, prediabetes, mild intermittent COPD, HLD, presented with worsening of stroke symptoms.  Patient was on the trip to [place] when she developed left arm weakness, she described as " my arm will not do what I wanted to do and coordination is off" and she went to a local ER 1 week later, MRI showed subtle enhancement on the right posterior frontal lobe and anterior right parietal lobe which favored subacute cortical infarct.  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  During that hospital stay, her left-sided weakness resolved and she was discharged.  While traveling back to [place], patient developed numbness of left upper lip and spasm of the left arm.  She went to her PCP 9 days ago, who ordered another MRI, which was done 3 days ago.  MRI done 3 days ago showed cortical hyperintensity right anterior hemisphere which correlated to the similar finding in MRI done in [place]. This morning, patient woke up with left facial droop and worsening of left arm weakness, she could not raise her arm above left shoulder.  Which is significant deterioration compared to her initial stroke presentation 12 days ago.  Patient also reported she developed tonic headache under right head overnight.  Patient denied any chest pain, no blurry vision. 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  Assessment & Plan:   Active Problems:   CVA (cerebral vascular accident) ([place])  Acute recurrent unilateral weakness, POA Rule out acute vssubacute CVA Rule out atypical seizure/todd's paralysis vs possible demyelinating disease/infection - Evolving  of symptoms with worsening of left-sided weakness and a new left-sided facial droop and brief speech difficulty in the morning. - CT unremarkable for acute process, MRI similar to previous scans - Continue Plavix/aspirin/statin - Neuro following-appreciate insight and recommendations - LP shows elevated leukocytosis and neutrophil count, ID consulted to rule out infectious process - EEG shows nonspecific cortical dysfunction without seizure or epileptiform discharges - CSF cytology and send out labs continue to be pending(VDRL, IgG, oligoclonal bands, Lyme titers, AFB, culture); serum ACE level pending - CT chest pending - PT and speech evaluation ongoing, improving daily  Left subclavian artery stenosis -Patient denied any lightheadedness vision changes or chest pains -Outpt vascular f/u  Hypocalcemia - PO replacement and check vitamin D level Lab Results  Component Value Date   VD25OH 18.27 (L) 09/27/2019   Headache, atypical -Location seems overlapping the stroke area, but ESR elevated Lab Results  Component Value Date   ESRSEDRATE 55 (H) 09/27/2019   HLD -Check lipid panel, continue statin  DVT prophylaxis: Lovenox Code Status: Full code Family Communication: Husband at bedside  Status is: Inpatient  Dispo: The patient is from: Home              Anticipated d/c is to: Home              Anticipated d/c date is: 24 to 48 hours              Patient currently not medically stable for discharge given need for ongoing work-up and evaluation and possible procedure with neurology  Consultants:   Neurology  Procedures:  Tentatively planned LP  Antimicrobials:  None indicated  Subjective: Acute issues or events overnight, headache appears to have improved drastically, otherwise denies nausea, vomiting, diarrhea, constipation, fevers, chills.  Objective: Vitals:   09/28/19 1556 09/28/19 1927 09/28/19 2324 09/29/19 0322  BP: (!) 117/55 (!) 114/59 (!) 124/61 (!)  113/60  Pulse: 88 74 78 75  Resp: '16 18 19 18  ' Temp: 99.6 F (37.6 C) 98 F (36.7 C) 98.3 F (36.8 C) 98.2 F (36.8 C)  TempSrc: Oral Oral Oral Oral  SpO2: 96% 97% 98% 96%  Weight:      Height:        Intake/Output Summary (Last 24 hours) at 09/29/2019 0731 Last data filed at 09/28/2019 0800 Gross per 24 hour  Intake 240 ml  Output --  Net 240 ml   Filed Weights   09/27/19 2329  Weight: 52 kg    Examination:  General:  Pleasantly resting in bed, No acute distress. HEENT:  Normocephalic atraumatic.  Sclerae nonicteric, noninjected.  Extraocular movements intact bilaterally. Neck:  Without mass or deformity.  Trachea is midline. Lungs:  Clear to auscultate bilaterally without rhonchi, wheeze, or rales. Heart:  Regular rate and rhythm.  Without murmurs, rubs, or gallops. Abdomen:  Soft, nontender, nondistended.  Without guarding or rebound. Extremities: Without cyanosis, clubbing, edema, or obvious deformity. Vascular:  Dorsalis pedis and posterior tibial pulses palpable bilaterally. Skin:  Warm and dry, no erythema, no ulcerations.   Data Reviewed: I have personally reviewed following labs and imaging studies  CBC: Recent Labs  Lab 09/27/19 1423 09/27/19 1424  WBC 11.6*  --   NEUTROABS 9.4*  --   HGB 12.3 13.3  HCT 39.5 39.0  MCV 83.5  --   PLT 412*  --    Basic Metabolic Panel: Recent Labs  Lab 09/27/19 1423 09/27/19 1424  NA 135 135  K 4.2 4.1  CL 100 99  CO2 23  --   GLUCOSE 90 89  BUN 6* 6*  CREATININE 0.83 0.70  CALCIUM 9.4  --    GFR: Estimated Creatinine Clearance: 50.1 mL/min (by C-G formula based on SCr of 0.7 mg/dL). Liver Function Tests: Recent Labs  Lab 09/27/19 1423  AST 16  ALT 15  ALKPHOS 79  BILITOT 0.6  PROT 7.1  ALBUMIN 3.3*   No results for input(s): LIPASE, AMYLASE in the last 168 hours. No results for input(s): AMMONIA in the last 168 hours. Coagulation Profile: Recent Labs  Lab 09/27/19 1423  INR 1.0   Cardiac  Enzymes: No results for input(s): CKTOTAL, CKMB, CKMBINDEX, TROPONINI in the last 168 hours. BNP (last 3 results) No results for input(s): PROBNP in the last 8760 hours. HbA1C: Recent Labs    09/28/19 0316  HGBA1C 5.9*   CBG: No results for input(s): GLUCAP in the last 168 hours. Lipid Profile: Recent Labs    09/28/19 0316  CHOL 121  HDL 47  LDLCALC 60  TRIG 70  CHOLHDL 2.6   Thyroid Function Tests: No results for input(s): TSH, T4TOTAL, FREET4, T3FREE, THYROIDAB in the last 72 hours. Anemia Panel: No results for input(s): VITAMINB12, FOLATE, FERRITIN, TIBC, IRON, RETICCTPCT in the last 72 hours. Sepsis Labs: No results for input(s): PROCALCITON, LATICACIDVEN in the last 168 hours.  Recent Results (from the past 240 hour(s))  SARS Coronavirus 2 by RT PCR (hospital order, performed in [facility] hospital lab) Nasopharyngeal Nasopharyngeal Swab     Status: None   Collection Time: 09/27/19  8:11 PM  Specimen: Nasopharyngeal Swab  Result Value Ref Range Status   SARS Coronavirus 2 NEGATIVE NEGATIVE Final    Comment: (NOTE) SARS-CoV-2 target nucleic acids are NOT DETECTED.  The SARS-CoV-2 RNA is generally detectable in upper and lower respiratory specimens during the acute phase of infection. The lowest concentration of SARS-CoV-2 viral copies this assay can detect is 250 copies / mL. A negative result does not preclude SARS-CoV-2 infection and should not be used as the sole basis for treatment or other patient management decisions.  A negative result may occur with improper specimen collection / handling, submission of specimen other than nasopharyngeal swab, presence of viral mutation(s) within the areas targeted by this assay, and inadequate number of viral copies (<250 copies / mL). A negative result must be combined with clinical observations, patient history, and epidemiological information.  Fact Sheet for Patients:    StrictlyIdeas.no  Fact Sheet for Healthcare Providers: BankingDealers.co.za  This test is not yet approved or  cleared by the [place] FDA and has been authorized for detection and/or diagnosis of SARS-CoV-2 by FDA under an Emergency Use Authorization (EUA).  This EUA will remain in effect (meaning this test can be used) for the duration of the COVID-19 declaration under Section 564(b)(1) of the Act, 21 U.S.C. section 360bbb-3(b)(1), unless the authorization is terminated or revoked sooner.  Performed at [facility], [place] [address], [place], [place] [address]   CSF culture     Status: None (Preliminary result)   Collection Time: 09/28/19  5:06 PM   Specimen: PATH Cytology CSF; Cerebrospinal Fluid  Result Value Ref Range Status   Specimen Description CSF  Final   Special Requests NONE  Final   Gram Stain   Final    WBC PRESENT, PREDOMINANTLY PMN NO ORGANISMS SEEN CYTOSPIN SMEAR Performed at [facility], [place] [address] [place], [place] [address]    Culture PENDING  Incomplete   Report Status PENDING  Incomplete         Radiology Studies: EEG  Result Date: 09/28/2019 Lora Havens, MD     09/28/2019  1:09 PM Patient Name: TNIYA BOWDITCH MRN: [id number] Epilepsy Attending: Lora Havens Referring Physician/Provider: Dr Kerney Elbe Date: 09/28/2019 Duration: 23.36 mins Patient history: 69 y.o.femalewitha recentpresentation at an OSH in [place] for what was diagnosed as a stroke (08/2019) in the context of atypical imaging appearance on MRI, who re-presented [facility] on Thursday as a code stroke for left sidedweakness and facial droop,worsened relative to her recent newbaseline. EEG to evaluate for seizure. Level of alertness: Awake, drowsy AEDs during EEG study: None Technical aspects: This EEG study was done with scalp electrodes positioned according to the 10-20 International system of  electrode placement. Electrical activity was acquired at a sampling rate of '500Hz'  and reviewed with a high frequency filter of '70Hz'  and a low frequency filter of '1Hz' . EEG data were recorded continuously and digitally stored. Description: The posterior dominant rhythm consists of 9 Hz activity of moderate voltage (25-35 uV) seen predominantly in posterior head regions, symmetric and reactive to eye opening and eye closing. Drowsiness was characterized by attenuation of the posterior background rhythm. EEG showed intermittent 3 to 6 Hz theta-delta slowing in right hemisphere.  Hyperventilation and photic stimulation were not performed.   ABNORMALITY -Intermittent slow, right hemisphere IMPRESSION: This study is suggestive of cortical dysfunction in right hemisphere nonspecific etiology but likely related to underlying cortical abnormality. No seizures or epileptiform discharges were seen throughout the  recording. Lora Havens   CT Code Stroke CTA Head W/WO contrast  Result Date: 09/27/2019 CLINICAL DATA:  Left-sided weakness EXAM: CT ANGIOGRAPHY HEAD AND NECK TECHNIQUE: Multidetector CT imaging of the head and neck was performed using the standard protocol during bolus administration of intravenous contrast. Multiplanar CT image reconstructions and MIPs were obtained to evaluate the vascular anatomy. Carotid stenosis measurements (when applicable) are obtained utilizing NASCET criteria, using the distal internal carotid diameter as the denominator. CONTRAST:  88m OMNIPAQUE IOHEXOL 350 MG/ML SOLN COMPARISON:  None. FINDINGS: CTA NECK Aortic arch: Mild plaque along the aortic arch. Great vessel origins are patent. There is eccentric primarily noncalcified plaque at the left subclavian origin causing approximately 60% stenosis. Ulcerated plaque is also noted. Right carotid system: Patent. Minimal calcified plaque at the ICA origin without measurable stenosis. Left carotid system: Patent. Mild calcified plaque at  the ICA origin without measurable stenosis. Vertebral arteries: Patent.  Codominant.  No measurable stenosis. Skeleton: Degenerative changes of the cervical spine. Other neck: No mass or adenopathy. Upper chest: No apical lung mass. Review of the MIP images confirms the above findings CTA HEAD Anterior circulation: Intracranial internal carotid arteries are patent. Anterior and middle cerebral arteries are patent. Posterior circulation: Intracranial vertebral arteries and PICA origins are patent. Basilar artery is patent. Superior cerebellar artery origins are patent. Patent posterior cerebral arteries. Venous sinuses: Patent as allowed by contrast bolus timing. Review of the MIP images confirms the above findings IMPRESSION: No large vessel occlusion. 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 Plaque ulceration is also noted. Electronically Signed   By: PMacy MisM.D.   On: 09/27/2019 14:52   CT Code Stroke CTA Neck W/WO contrast  Result Date: 09/27/2019 CLINICAL DATA:  Left-sided weakness EXAM: CT ANGIOGRAPHY HEAD AND NECK TECHNIQUE: Multidetector CT imaging of the head and neck was performed using the standard protocol during bolus administration of intravenous contrast. Multiplanar CT image reconstructions and MIPs were obtained to evaluate the vascular anatomy. Carotid stenosis measurements (when applicable) are obtained utilizing NASCET criteria, using the distal internal carotid diameter as the denominator. CONTRAST:  593mOMNIPAQUE IOHEXOL 350 MG/ML SOLN COMPARISON:  None. FINDINGS: CTA NECK Aortic arch: Mild plaque along the aortic arch. Great vessel origins are patent. There is eccentric primarily noncalcified plaque at the left subclavian origin causing approximately 60% stenosis. Ulcerated plaque is also noted. Right carotid system: Patent. Minimal calcified plaque at the ICA origin without measurable stenosis. Left carotid system: Patent. Mild  calcified plaque at the ICA origin without measurable stenosis. Vertebral arteries: Patent.  Codominant.  No measurable stenosis. Skeleton: Degenerative changes of the cervical spine. Other neck: No mass or adenopathy. Upper chest: No apical lung mass. Review of the MIP images confirms the above findings CTA HEAD Anterior circulation: Intracranial internal carotid arteries are patent. Anterior and middle cerebral arteries are patent. Posterior circulation: Intracranial vertebral arteries and PICA origins are patent. Basilar artery is patent. Superior cerebellar artery origins are patent. Patent posterior cerebral arteries. Venous sinuses: Patent as allowed by contrast bolus timing. Review of the MIP images confirms the above findings IMPRESSION: No large vessel occlusion. 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 Plaque ulceration is also noted. Electronically Signed   By: PrMacy Mis.D.   On: 09/27/2019 14:52   MR BRAIN WO CONTRAST  Result Date: 09/27/2019 CLINICAL DATA:  [age]ear old female left side weakness code stroke presentation today. EXAM: MRI HEAD WITHOUT  CONTRAST TECHNIQUE: Multiplanar, multiecho pulse sequences of the brain and surrounding structures were obtained without intravenous contrast. COMPARISON:  CTA head and neck, CT head earlier today. Brain MRI [facility] 09/22/2019. Report of an outside brain MRI without and with contrast 09/15/2019. FINDINGS: Brain: No parenchymal restricted or facilitated diffusion is identified. However, there may be subtle a abnormal diffusion along the surface of the right superior frontal lobe (series 7, image 50). And there is persistent abnormal appearance of the superior right frontal lobe and also contralateral left cingulate sulci on FLAIR imaging with hyperintensity (series 11, image 20). But no gyral edema or encephalomalacia is evident. SWI imaging is unremarkable, and  noncontrast CT today demonstrated only subtle asymmetric effacement of the sulci. Questionable similar but less pronounced FLAIR abnormality along the left inferior parietal/occipital junction on series 11, image 14 today, and stable from 09/22/2019. No similar FLAIR abnormality elsewhere. No intraventricular debris. Basilar cisterns appear normal. No midline shift, mass effect, evidence of mass lesion, ventriculomegaly, extra-axial collection or acute intracranial hemorrhage. Cervicomedullary junction and pituitary are within normal limits. Vascular: Major intracranial vascular flow voids are stable. Skull and upper cervical spine: Negative visible cervical spine. Visualized bone marrow signal is within normal limits. Sinuses/Orbits: Stable, negative. Other: Mastoids remain clear.  Scalp and face appear negative. IMPRESSION: 1. Persistent abnormal FLAIR signal in the sulci over the superior right frontal convexity, first reported on 09/15/2019 and reportedly with abnormal enhancement at that time. But no evidence of developing encephalomalacia, no evidence of blood products on CT or MRI, and no convincing gyral/parenchymal signal or diffusion abnormality (although questionable abnormal leptomeningeal appearance there on DWI). Similar abnormal sulci in the left cingulate and at the left parieto-occipital junction. But other CSF spaces appear normal. 2. The etiology is unclear and CSF analysis is recommended. 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 Electronically Signed   By: Genevie Ann M.D.   On: 09/27/2019 18:49   ECHOCARDIOGRAM COMPLETE  Result Date: 09/28/2019    ECHOCARDIOGRAM REPORT   Patient Name:   AMBRI MILTNER Date of Exam: 09/27/2019 Medical Rec #:  [id number]         Height:       61.0 in Accession #:    [id number]        Weight:       117.4 lb Date of Birth:  Jul 26, 1950          BSA:          1.506 m Patient Age:     44 years          BP:           121/56 mmHg Patient Gender: F                 HR:           91 bpm. Exam Location:  Inpatient Procedure: 2D Echo Indications:    TIA  History:        Patient has prior history of Echocardiogram examinations, most                 recent 11/11/2017. COPD; Risk Factors:Former Smoker.  Sonographer:    Jannett Celestine RDCS (AE) Referring Phys: [id number] Lequita Halt  Sonographer Comments: Suboptimal parasternal window. off axis apical windows IMPRESSIONS  1. Left ventricular ejection fraction, by estimation, is 60 to 65%. The left ventricle has normal function.  The left ventricle has no regional wall motion abnormalities. Left ventricular diastolic parameters were normal.  2. Right ventricular systolic function is normal. The right ventricular size is normal. Tricuspid regurgitation signal is inadequate for assessing PA pressure.  3. The mitral valve is normal in structure. No evidence of mitral valve regurgitation.  4. The aortic valve was not well visualized. Aortic valve regurgitation is not visualized. No aortic stenosis is present. FINDINGS  Left Ventricle: Left ventricular ejection fraction, by estimation, is 60 to 65%. The left ventricle has normal function. The left ventricle has no regional wall motion abnormalities. The left ventricular internal cavity size was normal in size. There is  no left ventricular hypertrophy. Left ventricular diastolic parameters were normal. Right Ventricle: The right ventricular size is normal. No increase in right ventricular wall thickness. Right ventricular systolic function is normal. Tricuspid regurgitation signal is inadequate for assessing PA pressure. Left Atrium: Left atrial size was normal in size. Right Atrium: Right atrial size was not well visualized. Pericardium: There is no evidence of pericardial effusion. Mitral Valve: The mitral valve is normal in structure. No evidence of mitral valve regurgitation. Tricuspid Valve: The tricuspid valve  is grossly normal. Tricuspid valve regurgitation is trivial. Aortic Valve: The aortic valve was not well visualized. Aortic valve regurgitation is not visualized. No aortic stenosis is present. Pulmonic Valve: The pulmonic valve was not well visualized. Pulmonic valve regurgitation is not visualized. Aorta: The aortic root was not well visualized. IAS/Shunts: The interatrial septum was not well visualized.  LEFT VENTRICLE PLAX 2D LVIDd:         3.50 cm Diastology LVIDs:         2.30 cm LV e' lateral:   9.68 cm/s LV PW:         1.00 cm LV E/e' lateral: 6.1 LV IVS:        0.80 cm  LEFT ATRIUM           Index LA diam:      2.60 cm 1.73 cm/m LA Vol (A2C): 19.5 ml 12.95 ml/m  AORTIC VALVE LVOT Vmax:   62.90 cm/s LVOT Vmean:  44.100 cm/s LVOT VTI:    0.126 m MITRAL VALVE MV Area (PHT): 3.72 cm    SHUNTS MV Decel Time: 204 msec    Systemic VTI: 0.13 m MV E velocity: 58.90 cm/s MV A velocity: 55.50 cm/s MV E/A ratio:  1.06 Oswaldo Milian MD Electronically signed by Oswaldo Milian MD Signature Date/Time: 09/28/2019/12:08:03 AM    Final    CT HEAD CODE STROKE WO CONTRAST  Result Date: 09/27/2019 CLINICAL DATA:  Code stroke.  Left-sided weakness EXAM: CT HEAD WITHOUT CONTRAST TECHNIQUE: Contiguous axial images were obtained from the base of the skull through the vertex without intravenous contrast. COMPARISON:  CT head 09/17/2019 FINDINGS: Brain: No acute intracranial hemorrhage, mass effect, or edema. Gray-white differentiation remains preserved. Ventricles are stable in size. No extra-axial fluid collection. Vascular: No hyperdense vessel. Skull: Unremarkable. Sinuses/Orbits: No acute abnormality. Other: Mastoid air cells are clear. ASPECTS ([place] Stroke Program Early CT Score) - Ganglionic level infarction (caudate, lentiform nuclei, internal capsule, insula, M1-M3 cortex): 7 - Supraganglionic infarction (M4-M6 cortex): 3 Total score (0-10 with 10 being normal): 10 IMPRESSION: No acute intracranial  hemorrhage or evidence acute infarction. ASPECT score is 10. These results were communicated to Dr. Cheral Marker at 2:32 pmon 7/29/2021by text page via the [facility] messaging system. Electronically Signed   By: Macy Mis M.D.   On: 09/27/2019 14:36  DG FLUORO GUIDE LUMBAR PUNCTURE  Result Date: 09/28/2019 CLINICAL DATA:  Left-sided weakness, abnormal MRI EXAM: DIAGNOSTIC LUMBAR PUNCTURE UNDER FLUOROSCOPIC GUIDANCE FLUOROSCOPY TIME:  Fluoroscopy Time:  18 seconds Radiation Exposure Index (if provided by the fluoroscopic device): 1.4 mGy PROCEDURE: Informed consent was obtained from the patient prior to the procedure, including potential complications of headache, allergy, and pain. With the patient prone, the lower back was prepped with Betadine. 1% Lidocaine was used for local anesthesia. Lumbar puncture was performed at the L3-L4 level using a 22 gauge needle with return of clear CSF. 13 ml of CSF were obtained for laboratory studies. The patient tolerated the procedure well and there were no apparent complications. IMPRESSION: Technically successful fluoroscopic guided lumbar puncture. Electronically Signed   By: Macy Mis M.D.   On: 09/28/2019 17:24   Scheduled Meds: . aspirin EC  81 mg Oral Daily  . atorvastatin  40 mg Oral QHS  . calcium carbonate  1 tablet Oral Q breakfast  . clopidogrel  75 mg Oral Daily  . enoxaparin (LOVENOX) injection  40 mg Subcutaneous Q24H  . feeding supplement (ENSURE ENLIVE)  237 mL Oral BID BM  . fluticasone furoate-vilanterol  1 puff Inhalation Daily  . pantoprazole  40 mg Oral Daily   Continuous Infusions:    LOS: 2 days   Time spent: 17mn  Desteni Piscopo C Yenty Bloch, DO [facility]  If 7PM-7AM, please contact night-coverage [email or web address]  09/29/2019, 7:31 AM

## 2019-09-29 NOTE — Progress Notes (Signed)
Subjective: No complaints.   Objective: Current vital signs: BP (!) 113/60 (BP Location: Right Arm)   Pulse 75   Temp 98.2 F (36.8 C) (Oral)   Resp 18   Ht _0  (1.549 m)   Wt 52 kg   SpO2 96%   BMI 21.66 kg/m  Vital signs in last 24 hours: Temp:  [98 F (36.7 C)-99.6 F (37.6 C)] 98.2 F (36.8 C) (07/31 0322) Pulse Rate:  [74-96] 75 (07/31 0322) Resp:  [16-20] 18 (07/31 0322) BP: (108-124)/(49-61) 113/60 (07/31 0322) SpO2:  [95 %-98 %] 96 % (07/31 0322)  Intake/Output from previous day: 07/30 0701 - 07/31 0700 In: 240 [P.O.:240] Out: -  Intake/Output this shift: No intake/output data recorded. Nutritional status:  Diet Order            Diet Heart Room service appropriate? Yes; Fluid consistency: Thin  Diet effective now                Exam: BP (!) 108/60 (BP Location: Right Arm)   Pulse 72   Temp 97.7 F (36.5 C) (Oral)   Resp 16   Ht _1  (1.549 m)   Wt 52 kg   SpO2 98%   BMI 21.66 kg/m   HEENT: [place]/AT Lungs: Respirations unlabored Ext: No edema  Neurologic Exam: Ment: Awake, alert and oriented. Speech fluent without evidence for aphasia.  CN: EOMI. Fixates and tracks normally. Subtle left facial droop.  Motor: 5/5 BUE and BLE. Subtle decreased coordination of left hand/fingers.  Sensory: Normal FT sensation to hands and forearms bilaterally.  Cerebellar: No ataxia with FNF bilaterally, but slower on the left and with subjective mild difficulty on the left per patient   Lab Results: Results for orders placed or performed during the hospital encounter of 09/27/19 (from the past 48 hour(s))  Ethanol     Status: None   Collection Time: 09/27/19  2:23 PM  Result Value Ref Range   Alcohol, Ethyl (B) <10 <10 mg/dL    Comment: (NOTE) Lowest detectable limit for serum alcohol is 10 mg/dL.  For medical purposes only. Performed at [facility], [place] [address] [place], [place] [address]   Protime-INR     Status: None   Collection Time:  09/27/19  2:23 PM  Result Value Ref Range   Prothrombin Time 13.1 11.4 - 15.2 seconds   INR 1.0 0.8 - 1.2    Comment: (NOTE) INR goal varies based on device and disease states. Performed at [facility], [place] [address], [place], [place] [address]   APTT     Status: None   Collection Time: 09/27/19  2:23 PM  Result Value Ref Range   aPTT 34 24 - 36 seconds    Comment: Performed at [facility] [address], [place], [place] [address]  CBC     Status: Abnormal   Collection Time: 09/27/19  2:23 PM  Result Value Ref Range   WBC 11.6 (H) 4.0 - 10.5 K/uL   RBC 4.73 3.87 - 5.11 MIL/uL   Hemoglobin 12.3 12.0 - 15.0 g/dL   HCT 39.5 36 - 46 %   MCV 83.5 80.0 - 100.0 fL   MCH 26.0 26.0 - 34.0 pg   MCHC 31.1 30.0 - 36.0 g/dL   RDW 13.9 11.5 - 15.5 %   Platelets 412 (H) 150 - 400 K/uL   nRBC 0.0 0.0 - 0.2 %    Comment: Performed at [facility], [address] [place], [place] [address]  Differential     Status: Abnormal   Collection Time: 09/27/19  2:23 PM  Result Value Ref Range   Neutrophils Relative % 81 %   Neutro Abs 9.4 (H) 1.7 - 7.7 K/uL   Lymphocytes Relative 9 %   Lymphs Abs 1.0 0.7 - 4.0 K/uL   Monocytes Relative 9 %   Monocytes Absolute 1.1 (H) 0 - 1 K/uL   Eosinophils Relative 0 %   Eosinophils Absolute 0.0 0 - 0 K/uL   Basophils Relative 0 %   Basophils Absolute 0.1 0 - 0 K/uL   Immature Granulocytes 1 %   Abs Immature Granulocytes 0.06 0.00 - 0.07 K/uL    Comment: Performed at [facility] [address] [place], [place] [address]  Comprehensive metabolic panel     Status: Abnormal   Collection Time: 09/27/19  2:23 PM  Result Value Ref Range   Sodium 135 135 - 145 mmol/L   Potassium 4.2 3.5 - 5.1 mmol/L   Chloride 100 98 - 111 mmol/L   CO2 23 22 - 32 mmol/L   Glucose, Bld 90 70 - 99 mg/dL    Comment: Glucose reference range applies only to samples taken after fasting for at least 8 hours.   BUN 6 (L) 8 - 23 mg/dL   Creatinine, Ser  0.83 0.44 - 1.00 mg/dL   Calcium 9.4 8.9 - 10.3 mg/dL   Total Protein 7.1 6.5 - 8.1 g/dL   Albumin 3.3 (L) 3.5 - 5.0 g/dL   AST 16 15 - 41 U/L   ALT 15 0 - 44 U/L   Alkaline Phosphatase 79 38 - 126 U/L   Total Bilirubin 0.6 0.3 - 1.2 mg/dL   GFR calc non Af Amer >60 >60 mL/min   GFR calc Af Amer >60 >60 mL/min   Anion gap 12 5 - 15    Comment: Performed at [facility], [place] [address] [place], [place] [address]  I-stat chem 8, ED     Status: Abnormal   Collection Time: 09/27/19  2:24 PM  Result Value Ref Range   Sodium 135 135 - 145 mmol/L   Potassium 4.1 3.5 - 5.1 mmol/L   Chloride 99 98 - 111 mmol/L   BUN 6 (L) 8 - 23 mg/dL   Creatinine, Ser 0.70 0.44 - 1.00 mg/dL   Glucose, Bld 89 70 - 99 mg/dL    Comment: Glucose reference range applies only to samples taken after fasting for at least 8 hours.   Calcium, Ion 1.12 (L) 1.15 - 1.40 mmol/L   TCO2 25 22 - 32 mmol/L   Hemoglobin 13.3 12.0 - 15.0 g/dL   HCT 39.0 36 - 46 %  HIV Antibody (routine testing w rflx)     Status: None   Collection Time: 09/27/19  4:44 PM  Result Value Ref Range   HIV Screen 4th Generation wRfx Non Reactive Non Reactive    Comment: Performed at [facility], [place] [address] [place], [place] [address]  VITAMIN D 25 Hydroxy (Vit-D Deficiency, Fractures)     Status: Abnormal   Collection Time: 09/27/19  4:44 PM  Result Value Ref Range   Vit D, 25-Hydroxy 18.27 (L) 30 - 100 ng/mL    Comment: (NOTE) Vitamin D deficiency has been defined by the [place] practice guideline as a level of serum 25-OH  vitamin D less than  20 ng/mL (1,2). The Endocrine Society went on to  further define vitamin D insufficiency as a level between 21 and 29  ng/mL (2).  1. IOM (Institute of Medicine). 2010. Dietary reference intakes for  calcium and D. [place]: The [employer]. 2. Holick MF, Binkley Meeker, Bischoff-Ferrari HA, et al. Evaluation,  treatment, and  prevention of vitamin D deficiency: an Endocrine  Society clinical practice guideline, JCEM. 2011 Jul; 96(7): 1911-30.  Performed at [facility], [place] [address] [place], [place] [address]   Sedimentation rate     Status: Abnormal   Collection Time: 09/27/19  4:44 PM  Result Value Ref Range   Sed Rate 55 (H) 0 - 22 mm/hr    Comment: Performed at [facility] [address] [place], [place] [address]  Urine rapid drug screen (hosp performed)     Status: None   Collection Time: 09/27/19  6:00 PM  Result Value Ref Range   Opiates NONE DETECTED NONE DETECTED   Cocaine NONE DETECTED NONE DETECTED   Benzodiazepines NONE DETECTED NONE DETECTED   Amphetamines NONE DETECTED NONE DETECTED   Tetrahydrocannabinol NONE DETECTED NONE DETECTED   Barbiturates NONE DETECTED NONE DETECTED    Comment: (NOTE) DRUG SCREEN FOR MEDICAL PURPOSES ONLY.  IF CONFIRMATION IS NEEDED FOR ANY PURPOSE, NOTIFY LAB WITHIN 5 DAYS.  LOWEST DETECTABLE LIMITS FOR URINE DRUG SCREEN Drug Class                     Cutoff (ng/mL) Amphetamine and metabolites    1000 Barbiturate and metabolites    200 Benzodiazepine                 088 Tricyclics and metabolites     300 Opiates and metabolites        300 Cocaine and metabolites        300 THC                            50 Performed at [facility], [place] [address], [place], [place] [address]   Urinalysis, Routine w reflex microscopic     Status: Abnormal   Collection Time: 09/27/19  6:00 PM  Result Value Ref Range   Color, Urine YELLOW YELLOW   APPearance CLEAR CLEAR   Specific Gravity, Urine >1.046 (H) 1.005 - 1.030   pH 6.0 5.0 - 8.0   Glucose, UA NEGATIVE NEGATIVE mg/dL   Hgb urine dipstick NEGATIVE NEGATIVE   Bilirubin Urine NEGATIVE NEGATIVE   Ketones, ur 20 (A) NEGATIVE mg/dL   Protein, ur NEGATIVE NEGATIVE mg/dL   Nitrite NEGATIVE NEGATIVE   Leukocytes,Ua TRACE (A) NEGATIVE   RBC / HPF 0-5 0 - 5 RBC/hpf   WBC, UA 0-5 0 - 5  WBC/hpf   Bacteria, UA NONE SEEN NONE SEEN   Squamous Epithelial / LPF 0-5 0 - 5    Comment: Performed at [facility], [place] [address] [place],  [address]  SARS Coronavirus 2 by RT PCR (hospital order, performed in [facility] hospital lab) Nasopharyngeal Nasopharyngeal Swab     Status: None   Collection Time: 09/27/19  8:11 PM   Specimen: Nasopharyngeal Swab  Result Value Ref Range   SARS Coronavirus 2 NEGATIVE NEGATIVE    Comment: (NOTE) SARS-CoV-2 target nucleic acids are NOT DETECTED.  The SARS-CoV-2 RNA is generally detectable in upper and lower respiratory specimens during the  acute phase of infection. The lowest concentration of SARS-CoV-2 viral copies this assay can detect is 250 copies / mL. A negative result does not preclude SARS-CoV-2 infection and should not be used as the sole basis for treatment or other patient management decisions.  A negative result may occur with improper specimen collection / handling, submission of specimen other than nasopharyngeal swab, presence of viral mutation(s) within the areas targeted by this assay, and inadequate number of viral copies (<250 copies / mL). A negative result must be combined with clinical observations, patient history, and epidemiological information.  Fact Sheet for Patients:   StrictlyIdeas.no  Fact Sheet for Healthcare Providers: BankingDealers.co.za  This test is not yet approved or  cleared by the [place] FDA and has been authorized for detection and/or diagnosis of SARS-CoV-2 by FDA under an Emergency Use Authorization (EUA).  This EUA will remain in effect (meaning this test can be used) for the duration of the COVID-19 declaration under Section 564(b)(1) of the Act, 21 U.S.C. section 360bbb-3(b)(1), unless the authorization is terminated or revoked sooner.  Performed at [facility], [place] [address] [place], [place] [address]    Hemoglobin A1c     Status: Abnormal   Collection Time: 09/28/19  3:16 AM  Result Value Ref Range   Hgb A1c MFr Bld 5.9 (H) 4.8 - 5.6 %    Comment: (NOTE) Pre diabetes:          5.7%-6.4%  Diabetes:              >6.4%  Glycemic control for   <7.0% adults with diabetes    Mean Plasma Glucose 122.63 mg/dL    Comment: Performed at [facility] [address] [place], [place] [address]  Lipid panel     Status: None   Collection Time: 09/28/19  3:16 AM  Result Value Ref Range   Cholesterol 121 0 - 200 mg/dL   Triglycerides 70 <150 mg/dL   HDL 47 >40 mg/dL   Total CHOL/HDL Ratio 2.6 RATIO   VLDL 14 0 - 40 mg/dL   LDL Cholesterol 60 0 - 99 mg/dL    Comment:        Total Cholesterol/HDL:CHD Risk Coronary Heart Disease Risk Table                     Men   Women  1/2 Average Risk   3.4   3.3  Average Risk       5.0   4.4  2 X Average Risk   9.6   7.1  3 X Average Risk  23.4   11.0        Use the calculated Patient Ratio above and the CHD Risk Table to determine the patient's CHD Risk.        ATP III CLASSIFICATION (LDL):  <100     mg/dL   Optimal  100-129  mg/dL   Near or Above                    Optimal  130-159  mg/dL   Borderline  160-189  mg/dL   High  >190     mg/dL   Very High Performed at [facility] [address], [place], [place] [address]   Glucose, CSF     Status: None   Collection Time: 09/28/19  5:06 PM  Result Value Ref Range   Glucose, CSF 42 40 - 70 mg/dL  Comment: Performed at [facility], [place] [address], [place], [place] [address]  Protein, CSF     Status: Abnormal   Collection Time: 09/28/19  5:06 PM  Result Value Ref Range   Total  Protein, CSF 50 (H) 15 - 45 mg/dL    Comment: Performed at [facility] [address] [place], [place] [address]  CSF cell count with differential     Status: Abnormal   Collection Time: 09/28/19  5:06 PM  Result Value Ref Range   Tube # 4    Color, CSF COLORLESS COLORLESS    Appearance, CSF CLEAR CLEAR   Supernatant NOT INDICATED    RBC Count, CSF 3 (H) 0 /cu mm   WBC, CSF 43 (HH) 0 - 5 /cu mm    Comment: CRITICAL RESULT CALLED TO, READ BACK BY AND VERIFIED WITH: L.RUDD RN [id number] 09/28/19 MCCORMICK K    Segmented Neutrophils-CSF 19 (H) 0 - 6 %   Lymphs, CSF 55 40 - 80 %   Monocyte-Macrophage-Spinal Fluid 26 15 - 45 %   Eosinophils, CSF 0 0 - 1 %    Comment: Performed at [facility] [address], [place], [place] [address]  CSF culture     Status: None (Preliminary result)   Collection Time: 09/28/19  5:06 PM   Specimen: PATH Cytology CSF; Cerebrospinal Fluid  Result Value Ref Range   Specimen Description CSF    Special Requests NONE    Gram Stain      WBC PRESENT, PREDOMINANTLY PMN NO ORGANISMS SEEN CYTOSPIN SMEAR Performed at [facility] [address] [place], [place] [address]    Culture PENDING    Report Status PENDING   Cryptococcal antigen, CSF     Status: None   Collection Time: 09/28/19  5:06 PM  Result Value Ref Range   Crypto Ag NEGATIVE NEGATIVE   Cryptococcal Ag Titer NOT INDICATED NOT INDICATED    Comment: Performed at [facility], [place] [address], [place], [place] [address]    Recent Results (from the past 240 hour(s))  SARS Coronavirus 2 by RT PCR (hospital order, performed in [facility] hospital lab) Nasopharyngeal Nasopharyngeal Swab     Status: None   Collection Time: 09/27/19  8:11 PM   Specimen: Nasopharyngeal Swab  Result Value Ref Range Status   SARS Coronavirus 2 NEGATIVE NEGATIVE Final    Comment: (NOTE) SARS-CoV-2 target nucleic acids are NOT DETECTED.  The SARS-CoV-2 RNA is generally detectable in upper and lower respiratory specimens during the acute phase of infection. The lowest concentration of SARS-CoV-2 viral copies this assay can detect is 250 copies / mL. A negative result does not preclude SARS-CoV-2 infection and should not be used as the sole basis for treatment or other patient  management decisions.  A negative result may occur with improper specimen collection / handling, submission of specimen other than nasopharyngeal swab, presence of viral mutation(s) within the areas targeted by this assay, and inadequate number of viral copies (<250 copies / mL). A negative result must be combined with clinical observations, patient history, and epidemiological information.  Fact Sheet for Patients:   StrictlyIdeas.no  Fact Sheet for Healthcare Providers: BankingDealers.co.za  This test is not yet approved or  cleared by the [place] FDA and has been authorized for detection and/or diagnosis of SARS-CoV-2 by FDA under an Emergency Use Authorization (EUA).  This EUA will remain in effect (meaning this test can be  used) for the duration of the COVID-19 declaration under Section 564(b)(1) of the Act, 21 U.S.C. section 360bbb-3(b)(1), unless the authorization is terminated or revoked sooner.  Performed at [facility], [place] [address], [place], [place] [address]   CSF culture     Status: None (Preliminary result)   Collection Time: 09/28/19  5:06 PM   Specimen: PATH Cytology CSF; Cerebrospinal Fluid  Result Value Ref Range Status   Specimen Description CSF  Final   Special Requests NONE  Final   Gram Stain   Final    WBC PRESENT, PREDOMINANTLY PMN NO ORGANISMS SEEN CYTOSPIN SMEAR Performed at [facility], [place] [address] [place], [place] [address]    Culture PENDING  Incomplete   Report Status PENDING  Incomplete    Lipid Panel Recent Labs    09/28/19 0316  CHOL 121  TRIG 70  HDL 47  CHOLHDL 2.6  VLDL 14  LDLCALC 60    Studies/Results: EEG  Result Date: 09/28/2019 Lora Havens, MD     09/28/2019  1:09 PM Patient Name: Danielle Fox MRN: [id number] Epilepsy Attending: Lora Havens Referring Physician/Provider: Dr Kerney Elbe Date: 09/28/2019 Duration: 23.36 mins Patient  history: 69 y.o.femalewitha recentpresentation at an OSH in [place] for what was diagnosed as a stroke (08/2019) in the context of atypical imaging appearance on MRI, who re-presented [place] ED on Thursday as a code stroke for left sidedweakness and facial droop,worsened relative to her recent newbaseline. EEG to evaluate for seizure. Level of alertness: Awake, drowsy AEDs during EEG study: None Technical aspects: This EEG study was done with scalp electrodes positioned according to the 10-20 International system of electrode placement. Electrical activity was acquired at a sampling rate of _0  and reviewed with a high frequency filter of _1  and a low frequency filter of _2 . EEG data were recorded continuously and digitally stored. Description: The posterior dominant rhythm consists of 9 Hz activity of moderate voltage (25-35 uV) seen predominantly in posterior head regions, symmetric and reactive to eye opening and eye closing. Drowsiness was characterized by attenuation of the posterior background rhythm. EEG showed intermittent 3 to 6 Hz theta-delta slowing in right hemisphere.  Hyperventilation and photic stimulation were not performed.   ABNORMALITY -Intermittent slow, right hemisphere IMPRESSION: This study is suggestive of cortical dysfunction in right hemisphere nonspecific etiology but likely related to underlying cortical abnormality. No seizures or epileptiform discharges were seen throughout the recording. Lora Havens   CT Code Stroke CTA Head W/WO contrast  Result Date: 09/27/2019 CLINICAL DATA:  Left-sided weakness EXAM: CT ANGIOGRAPHY HEAD AND NECK TECHNIQUE: Multidetector CT imaging of the head and neck was performed using the standard protocol during bolus administration of intravenous contrast. Multiplanar CT image reconstructions and MIPs were obtained to evaluate the vascular anatomy. Carotid stenosis measurements (when applicable) are obtained utilizing NASCET criteria,  using the distal internal carotid diameter as the denominator. CONTRAST:  21m OMNIPAQUE IOHEXOL 350 MG/ML SOLN COMPARISON:  None. FINDINGS: CTA NECK Aortic arch: Mild plaque along the aortic arch. Great vessel origins are patent. There is eccentric primarily noncalcified plaque at the left subclavian origin causing approximately 60% stenosis. Ulcerated plaque is also noted. Right carotid system: Patent. Minimal calcified plaque at the ICA origin without measurable stenosis. Left carotid system: Patent. Mild calcified plaque at the ICA origin without measurable stenosis. Vertebral arteries: Patent.  Codominant.  No measurable stenosis. Skeleton: Degenerative changes of the cervical spine. Other neck: No mass or adenopathy.  Upper chest: No apical lung mass. Review of the MIP images confirms the above findings CTA HEAD Anterior circulation: Intracranial internal carotid arteries are patent. Anterior and middle cerebral arteries are patent. Posterior circulation: Intracranial vertebral arteries and PICA origins are patent. Basilar artery is patent. Superior cerebellar artery origins are patent. Patent posterior cerebral arteries. Venous sinuses: Patent as allowed by contrast bolus timing. Review of the MIP images confirms the above findings IMPRESSION: No large vessel occlusion. 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 Plaque ulceration is also noted. Electronically Signed   By: Macy Mis M.D.   On: 09/27/2019 14:52   CT Code Stroke CTA Neck W/WO contrast  Result Date: 09/27/2019 CLINICAL DATA:  Left-sided weakness EXAM: CT ANGIOGRAPHY HEAD AND NECK TECHNIQUE: Multidetector CT imaging of the head and neck was performed using the standard protocol during bolus administration of intravenous contrast. Multiplanar CT image reconstructions and MIPs were obtained to evaluate the vascular anatomy. Carotid stenosis measurements (when applicable) are obtained  utilizing NASCET criteria, using the distal internal carotid diameter as the denominator. CONTRAST:  55m OMNIPAQUE IOHEXOL 350 MG/ML SOLN COMPARISON:  None. FINDINGS: CTA NECK Aortic arch: Mild plaque along the aortic arch. Great vessel origins are patent. There is eccentric primarily noncalcified plaque at the left subclavian origin causing approximately 60% stenosis. Ulcerated plaque is also noted. Right carotid system: Patent. Minimal calcified plaque at the ICA origin without measurable stenosis. Left carotid system: Patent. Mild calcified plaque at the ICA origin without measurable stenosis. Vertebral arteries: Patent.  Codominant.  No measurable stenosis. Skeleton: Degenerative changes of the cervical spine. Other neck: No mass or adenopathy. Upper chest: No apical lung mass. Review of the MIP images confirms the above findings CTA HEAD Anterior circulation: Intracranial internal carotid arteries are patent. Anterior and middle cerebral arteries are patent. Posterior circulation: Intracranial vertebral arteries and PICA origins are patent. Basilar artery is patent. Superior cerebellar artery origins are patent. Patent posterior cerebral arteries. Venous sinuses: Patent as allowed by contrast bolus timing. Review of the MIP images confirms the above findings IMPRESSION: No large vessel occlusion. 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 Plaque ulceration is also noted. Electronically Signed   By: PMacy MisM.D.   On: 09/27/2019 14:52   MR BRAIN WO CONTRAST  Result Date: 09/27/2019 CLINICAL DATA:  69year old female left side weakness code stroke presentation today. EXAM: MRI HEAD WITHOUT CONTRAST TECHNIQUE: Multiplanar, multiecho pulse sequences of the brain and surrounding structures were obtained without intravenous contrast. COMPARISON:  CTA head and neck, CT head earlier today. [facility]. Report of an  outside brain MRI without and with contrast 09/15/2019. FINDINGS: Brain: No parenchymal restricted or facilitated diffusion is identified. However, there may be subtle a abnormal diffusion along the surface of the right superior frontal lobe (series 7, image 50). And there is persistent abnormal appearance of the superior right frontal lobe and also contralateral left cingulate sulci on FLAIR imaging with hyperintensity (series 11, image 20). But no gyral edema or encephalomalacia is evident. SWI imaging is unremarkable, and noncontrast CT today demonstrated only subtle asymmetric effacement of the sulci. Questionable similar but less pronounced FLAIR abnormality along the left inferior parietal/occipital junction on series 11, image 14 today, and stable from 09/22/2019. No similar FLAIR abnormality elsewhere. No intraventricular debris. Basilar cisterns appear normal. No midline shift, mass effect, evidence of mass lesion, ventriculomegaly, extra-axial collection or acute intracranial  hemorrhage. Cervicomedullary junction and pituitary are within normal limits. Vascular: Major intracranial vascular flow voids are stable. Skull and upper cervical spine: Negative visible cervical spine. Visualized bone marrow signal is within normal limits. Sinuses/Orbits: Stable, negative. Other: Mastoids remain clear.  Scalp and face appear negative. IMPRESSION: 1. Persistent abnormal FLAIR signal in the sulci over the superior right frontal convexity, first reported on 09/15/2019 and reportedly with abnormal enhancement at that time. But no evidence of developing encephalomalacia, no evidence of blood products on CT or MRI, and no convincing gyral/parenchymal signal or diffusion abnormality (although questionable abnormal leptomeningeal appearance there on DWI). Similar abnormal sulci in the left cingulate and at the left parieto-occipital junction. But other CSF spaces appear normal. 2. The etiology is unclear and CSF analysis is  recommended. 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 Electronically Signed   By: Genevie Ann M.D.   On: 09/27/2019 18:49   ECHOCARDIOGRAM COMPLETE  Result Date: 09/28/2019    ECHOCARDIOGRAM REPORT   Patient Name:   Danielle Fox Date of Exam: 09/27/2019 Medical Rec #:  [id number]         Height:       61.0 in Accession #:    [id number]        Weight:       117.4 lb Date of Birth:  1950/03/17          BSA:          1.506 m Patient Age:    69 years          BP:           121/56 mmHg Patient Gender: F                 HR:           91 bpm. Exam Location:  Inpatient Procedure: 2D Echo Indications:    TIA  History:        Patient has prior history of Echocardiogram examinations, most                 recent 11/11/2017. COPD; Risk Factors:Former Smoker.  Sonographer:    Jannett Celestine RDCS (AE) Referring Phys: [id number] Lequita Halt  Sonographer Comments: Suboptimal parasternal window. off axis apical windows IMPRESSIONS  1. Left ventricular ejection fraction, by estimation, is 60 to 65%. The left ventricle has normal function. The left ventricle has no regional wall motion abnormalities. Left ventricular diastolic parameters were normal.  2. Right ventricular systolic function is normal. The right ventricular size is normal. Tricuspid regurgitation signal is inadequate for assessing PA pressure.  3. The mitral valve is normal in structure. No evidence of mitral valve regurgitation.  4. The aortic valve was not well visualized. Aortic valve regurgitation is not visualized. No aortic stenosis is present. FINDINGS  Left Ventricle: Left ventricular ejection fraction, by estimation, is 60 to 65%. The left ventricle has normal function. The left ventricle has no regional wall motion abnormalities. The left ventricular internal cavity size was normal in size. There is  no left ventricular hypertrophy. Left ventricular diastolic parameters were  normal. Right Ventricle: The right ventricular size is normal. No increase in right ventricular wall thickness. Right ventricular systolic function is normal. Tricuspid regurgitation signal is inadequate for assessing PA pressure. Left Atrium: Left atrial size was normal in size. Right Atrium: Right atrial size was not well visualized. Pericardium: There is no evidence  of pericardial effusion. Mitral Valve: The mitral valve is normal in structure. No evidence of mitral valve regurgitation. Tricuspid Valve: The tricuspid valve is grossly normal. Tricuspid valve regurgitation is trivial. Aortic Valve: The aortic valve was not well visualized. Aortic valve regurgitation is not visualized. No aortic stenosis is present. Pulmonic Valve: The pulmonic valve was not well visualized. Pulmonic valve regurgitation is not visualized. Aorta: The aortic root was not well visualized. IAS/Shunts: The interatrial septum was not well visualized.  LEFT VENTRICLE PLAX 2D LVIDd:         3.50 cm Diastology LVIDs:         2.30 cm LV e' lateral:   9.68 cm/s LV PW:         1.00 cm LV E/e' lateral: 6.1 LV IVS:        0.80 cm  LEFT ATRIUM           Index LA diam:      2.60 cm 1.73 cm/m LA Vol (A2C): 19.5 ml 12.95 ml/m  AORTIC VALVE LVOT Vmax:   62.90 cm/s LVOT Vmean:  44.100 cm/s LVOT VTI:    0.126 m MITRAL VALVE MV Area (PHT): 3.72 cm    SHUNTS MV Decel Time: 204 msec    Systemic VTI: 0.13 m MV E velocity: 58.90 cm/s MV A velocity: 55.50 cm/s MV E/A ratio:  1.06 Oswaldo Milian MD Electronically signed by Oswaldo Milian MD Signature Date/Time: 09/28/2019/12:08:03 AM    Final    CT HEAD CODE STROKE WO CONTRAST  Result Date: 09/27/2019 CLINICAL DATA:  Code stroke.  Left-sided weakness EXAM: CT HEAD WITHOUT CONTRAST TECHNIQUE: Contiguous axial images were obtained from the base of the skull through the vertex without intravenous contrast. COMPARISON:  CT head 09/17/2019 FINDINGS: Brain: No acute intracranial hemorrhage, mass  effect, or edema. Gray-white differentiation remains preserved. Ventricles are stable in size. No extra-axial fluid collection. Vascular: No hyperdense vessel. Skull: Unremarkable. Sinuses/Orbits: No acute abnormality. Other: Mastoid air cells are clear. ASPECTS ([place] Stroke Program Early CT Score) - Ganglionic level infarction (caudate, lentiform nuclei, internal capsule, insula, M1-M3 cortex): 7 - Supraganglionic infarction (M4-M6 cortex): 3 Total score (0-10 with 10 being normal): 10 IMPRESSION: No acute intracranial hemorrhage or evidence acute infarction. ASPECT score is 10. These results were communicated to Dr. Cheral Marker at 2:32 pmon 7/29/2021by text page via the [facility] messaging system. Electronically Signed   By: Macy Mis M.D.   On: 09/27/2019 14:36   DG FLUORO GUIDE LUMBAR PUNCTURE  Result Date: 09/28/2019 CLINICAL DATA:  Left-sided weakness, abnormal MRI EXAM: DIAGNOSTIC LUMBAR PUNCTURE UNDER FLUOROSCOPIC GUIDANCE FLUOROSCOPY TIME:  Fluoroscopy Time:  18 seconds Radiation Exposure Index (if provided by the fluoroscopic device): 1.4 mGy PROCEDURE: Informed consent was obtained from the patient prior to the procedure, including potential complications of headache, allergy, and pain. With the patient prone, the lower back was prepped with Betadine. 1% Lidocaine was used for local anesthesia. Lumbar puncture was performed at the L3-L4 level using a 22 gauge needle with return of clear CSF. 13 ml of CSF were obtained for laboratory studies. The patient tolerated the procedure well and there were no apparent complications. IMPRESSION: Technically successful fluoroscopic guided lumbar puncture. Electronically Signed   By: Macy Mis M.D.   On: 09/28/2019 17:24    Medications:  Scheduled: . aspirin EC  81 mg Oral Daily  . atorvastatin  40 mg Oral QHS  . calcium carbonate  1 tablet Oral Q breakfast  . clopidogrel  75 mg  Oral Daily  . enoxaparin (LOVENOX) injection  40 mg Subcutaneous Q24H   . feeding supplement (ENSURE ENLIVE)  237 mL Oral BID BM  . fluticasone furoate-vilanterol  1 puff Inhalation Daily  . pantoprazole  40 mg Oral Daily   MRI brain with/without contrast, 09/22/19: Cortical/sulcal hyperintensity within the anterior right hemisphere on the axial FLAIR sequence. According to the report from MRI performed 09/15/2019, this finding was also present at that time. This may be a late subacute sequela of an ischemic event. No abnormal contrast enhancement is visible on the current study, though it was reportedly present on the prior examination.   Assessment:69 y.o.femalewitha recentpresentation at an OSH in [place] for what was diagnosed as a stroke (08/2019) in the context of atypical imaging appearance on MRI, who re-presented [place] [facility] on Thursday as a code stroke for left sidedweakness and facial droop,worsened relative to her recent newbaseline. --EEG (7/30): ABNORMALITY: Intermittent slow, right hemisphere. IMPRESSION: This study is suggestive of cortical dysfunction in right hemisphere nonspecific etiology but likely related to underlying cortical abnormality. No seizures or epileptiform discharges were seen throughout the recording. -- CTH was negative for hemorrhage. CTA did not show anyLVO.  --MRI brain from Thursday (7/29) showed persistent abnormal FLAIR signal in the sulci over the superior right frontal convexity, first reported on 09/15/2019 and reportedly with abnormal enhancement at that time. But no evidence of developing encephalomalacia, no evidence of blood products on CT or MRI, and no convincing gyral/parenchymal signal or diffusion abnormality (although questionable abnormal leptomeningeal appearance there on DWI). Similar abnormal sulci in the left cingulate and at the left parieto-occipital junction. But other CSF spaces appear normal.The etiology is unclear and CSF analysis is recommended. 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 -- Echocardiogram: Left ventricular ejection fraction, by estimation, is 60 to 65%. The left ventricle has normal function. The left ventricle has no regional wall motion abnormalities. No mural thrombus described in the report.  -- ESR elevated at 55 -- Findings on MRI are highly atypical for stroke. DDx for her persisting cortically-based lesions on MRI now more weighted towards an inflammatory process (rheumatoid meningitis), relatively indolent infectious process (syphilis, TB, fungal meningitis), malignancy (leptomeningeal carcinomatosis) and cortical edema from possible subclinical seizures.   -- Fluoro-guided LP obtained on Friday. CSF is abnormal, with elevated WBC of 43 (19% segmented neutrophils present, suggestive of infection), elevated protein of 50 and glucose of 42, which most likely is below the normal value of 0.6 x serum glucose given serum glucose of 89 three hours earlier. Gram stain was with no organisms seen. Cryptococcal Ag negative.  -- Pending CSF labs: AFB smear, cultures   Recommendations: --Continue ASA, Plavix and statin -- PT consult, OT consult, Speech consult -- Telemetry monitoring -- Frequent neuro checks -- CSF cytology is pending -- CSF lab sendouts pending: VDRL, IgG index, oligoclonal bands, Lyme titers (confirmed that these were sent to [facility]) -- Other CSF labs pending: AFB smear, cultures  -- Serum RMSF titers pending -- ID has been consulted to assess for possible infectious etiologies -- CT chest with contrast to assess for possible sarcoidosis (ordered) -- Serum ACE level (ordered)     LOS: 2 days   _0  signed: Dr. Kerney Elbe 09/29/2019  7:34 AM

## 2019-09-29 NOTE — Progress Notes (Signed)
Occupational Therapy Treatment Patient Details Name: Danielle Fox MRN: [id number] DOB: September 10, 1950 Today's Date: 09/29/2019    History of present illness Pt is a 69 y.o. female with PMH of recent stroke on 7/17, prediabetes, mild intermittent COPD, hypokalemia, dyspnea, HLD, presented with worsening of stroke symptoms (L sided weakness). 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 DDx for her persisting cortically-based lesions on MRI now more weighted towards an inflammatory process (rheumatoid meningitis), relatively indolent infectious process (syphilis, TB, fungal meningitis), malignancy (leptomeningeal carcinomatosis) and cortical edema from possible subclinical seizures.   OT comments  This 69 yo female seen today for education on LUE exercises/activities. Pt still with coordination issues with LUE. Pt able to work on activities given to her. She will continue to benefit from acute OT with follow up [place].  Follow Up Recommendations  Outpatient OT    Equipment Recommendations  None recommended by OT       Precautions / Restrictions Precautions Precautions: None                                 Exercises Other Exercises Other Exercises: Pt seen for LUE FM coordination exercises. FM handout and theraputty handout provided and had patient practice some of them and talked her and her sister through the others. Pt is to do 3 of the activities 5 times a day. Encouraged her to use, use, use LUE as much as possible.            Frequency  Min 2X/week        Progress Toward Goals  OT Goals(current goals can now be found in the care plan section)  Progress towards OT goals: Progressing toward goals     Plan Discharge plan remains appropriate       AM-PAC OT "6 Clicks" Daily Activity     Outcome Measure   Help from  another person eating meals?: None Help from another person taking care of personal grooming?: A Little Help from another person toileting, which includes using toliet, bedpan, or urinal?: A Little Help from another person bathing (including washing, rinsing, drying)?: A Little Help from another person to put on and taking off regular upper body clothing?: A Little Help from another person to put on and taking off regular lower body clothing?: A Little 6 Click Score: 19    End of Session    OT Visit Diagnosis: Muscle weakness (generalized) (M62.81);Other symptoms and signs involving the nervous system (R29.898)   Activity Tolerance Patient tolerated treatment well   Patient Left with family/visitor present (sitting EOB)           Time: 1027-2536 OT Time Calculation (min): 32 min  Charges: OT General Charges $OT Visit: 1 Visit OT Treatments $Therapeutic Exercise: 23-37 mins    Almon Register 09/29/2019, 6:00 PM

## 2019-09-30 ENCOUNTER — Inpatient Hospital Stay (HOSPITAL_COMMUNITY): Payer: Medicare Other

## 2019-09-30 LAB — ACID FAST SMEAR (AFB, MYCOBACTERIA): Acid Fast Smear: NEGATIVE

## 2019-09-30 MED ORDER — IOHEXOL 300 MG/ML  SOLN
100.0000 mL | Freq: Once | INTRAMUSCULAR | Status: AC | PRN
  Administered 2019-09-30: 100 mL via INTRAVENOUS

## 2019-09-30 NOTE — Progress Notes (Signed)
PROGRESS NOTE    Danielle Fox  ZOX:[id number] DOB: Jun 28, 1950 DOA: 09/27/2019 PCP: Mosie Lukes, MD   Brief Narrative:  Danielle Fox is a 68 y.o. female with medical history significant of recent stroke on 7/17, prediabetes, mild intermittent COPD, HLD, presented with worsening of stroke symptoms.  Patient was on the trip to [place] when she developed Danielle Fox, she described as " my arm will not do what I wanted to do and coordination is off" and she went to a local ER 1 week later, MRI showed subtle enhancement on the right posterior frontal lobe and anterior right parietal lobe which favored subacute cortical infarct.  CT angiogram showed no major intracranial blockage but 70% stenosis involve the Danielle origin of subclavian artery.  During that hospital stay, her Danielle-sided Fox resolved and she was discharged.  While traveling back to [place], patient developed numbness of Danielle upper lip and spasm of the Danielle arm.  She went to her PCP 9 days ago, who ordered another MRI, which was done 3 days ago.  MRI done 3 days ago showed cortical hyperintensity right anterior hemisphere which correlated to the similar finding in MRI done in [place]. This morning, patient woke up with Danielle facial droop and worsening of Danielle Fox, she could not raise her arm above Danielle shoulder.  Which is significant deterioration compared to her initial stroke presentation 12 days ago.  Patient also reported she developed tonic headache under right head overnight.  Patient denied any chest pain, no blurry vision. CTA showed no significant intracranial stenosis, CTA of the showed 70% narrowing of the origin of the Danielle subclavian artery.  Assessment & Plan:   Active Problems:   CVA (cerebral vascular accident) ([place])  Acute recurrent unilateral Fox, POA Acute/subacute CVA ruled out Rule out atypical seizure/todd's paralysis vs possible demyelinating disease/infection - Evolving of  symptoms with worsening of Danielle-sided Fox and a new Danielle-sided facial droop and brief speech difficulty in the morning -Recurrent event overnight of Danielle arm and face Fox and paresthesias but not as pronounced as previous episodes - CT unremarkable for acute process, MRI similar to previous scans -possible leptomeningeal abnormality on diffusion-weighted imaging -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 - Continue Plavix/aspirin/statin - Neuro following-appreciate insight and recommendations - LP shows elevated leukocytosis and neutrophil count, ID consulted to rule out infectious process - labs pending as below - EEG shows nonspecific cortical dysfunction without seizure or epileptiform discharges - CSF cytology, culture, and send out labs continue to be pending(VDRL, IgG, oligoclonal bands, Lyme titers, AFB, culture); serum ACE level pending - CT chest pending - PT and speech evaluation ongoing, improving daily  Danielle subclavian artery stenosis -Patient denied any lightheadedness vision changes or chest pains -Outpt vascular f/u  Hypocalcemia - PO replacement and check vitamin D level Lab Results  Component Value Date   VD25OH 18.27 (L) 09/27/2019   Headache, atypical -Location seems overlapping the stroke area, but ESR elevated Lab Results  Component Value Date   ESRSEDRATE 55 (H) 09/27/2019   HLD - Check lipid panel, continue statin  DVT prophylaxis: Lovenox Code Status: Full code Family Communication: Husband at bedside  Status is: Inpatient  Dispo: The patient is from: Home              Anticipated d/c is to: Home              Anticipated d/c date is: 24 to 48 hours  Patient currently not medically stable for discharge given need for ongoing work-up and evaluation and possible procedure with neurology  Consultants:   Neurology  Procedures:   Tentatively planned LP  Antimicrobials:  None indicated  Subjective: Overnight patient  had a subsequent episode of Danielle Fox and Danielle facial and Danielle arm paresthesias which resolved prior to morning shift.  She otherwise declines headache, fever, chills, nausea, vomiting, diarrhea, constipation.  Objective: Vitals:   09/29/19 1547 09/29/19 2011 09/30/19 0005 09/30/19 0415  BP: (!) 108/54 (!) 115/52 114/66 (!) 108/60  Pulse: 77 78 84 79  Resp: '18 17 16 15  ' Temp: 98 F (36.7 C) 98 F (36.7 C) 99.1 F (37.3 C) 98 F (36.7 C)  TempSrc: Oral Oral Oral Oral  SpO2: 98% 98% 99% 98%  Weight:      Height:       No intake or output data in the 24 hours ending 09/30/19 0734 Filed Weights   09/27/19 2329  Weight: 52 kg    Examination:  General:  Pleasantly resting in bed, No acute distress. HEENT:  Normocephalic atraumatic.  Sclerae nonicteric, noninjected.  Extraocular movements intact bilaterally. Neck:  Without mass or deformity.  Trachea is midline. Lungs:  Clear to auscultate bilaterally without rhonchi, wheeze, or rales. Heart:  Regular rate and rhythm.  Without murmurs, rubs, or gallops. Abdomen:  Soft, nontender, nondistended.  Without guarding or rebound. Extremities: Without cyanosis, clubbing, edema, or obvious deformity. Vascular:  Dorsalis pedis and posterior tibial pulses palpable bilaterally. Skin:  Warm and dry, no erythema, no ulcerations.   Data Reviewed: I have personally reviewed following labs and imaging studies  CBC: Recent Labs  Lab 09/27/19 1423 09/27/19 1424  WBC 11.6*  --   NEUTROABS 9.4*  --   HGB 12.3 13.3  HCT 39.5 39.0  MCV 83.5  --   PLT 412*  --    Basic Metabolic Panel: Recent Labs  Lab 09/27/19 1423 09/27/19 1424  NA 135 135  K 4.2 4.1  CL 100 99  CO2 23  --   GLUCOSE 90 89  BUN 6* 6*  CREATININE 0.83 0.70  CALCIUM 9.4  --    GFR: Estimated Creatinine Clearance: 50.1 mL/min (by C-G formula based on SCr of 0.7 mg/dL). Liver Function Tests: Recent Labs  Lab 09/27/19 1423  AST 16  ALT 15  ALKPHOS 79    BILITOT 0.6  PROT 7.1  ALBUMIN 3.3*   No results for input(s): LIPASE, AMYLASE in the last 168 hours. No results for input(s): AMMONIA in the last 168 hours. Coagulation Profile: Recent Labs  Lab 09/27/19 1423  INR 1.0   Cardiac Enzymes: No results for input(s): CKTOTAL, CKMB, CKMBINDEX, TROPONINI in the last 168 hours. BNP (last 3 results) No results for input(s): PROBNP in the last 8760 hours. HbA1C: Recent Labs    09/28/19 0316  HGBA1C 5.9*   CBG: No results for input(s): GLUCAP in the last 168 hours. Lipid Profile: Recent Labs    09/28/19 0316  CHOL 121  HDL 47  LDLCALC 60  TRIG 70  CHOLHDL 2.6   Thyroid Function Tests: No results for input(s): TSH, T4TOTAL, FREET4, T3FREE, THYROIDAB in the last 72 hours. Anemia Panel: No results for input(s): VITAMINB12, FOLATE, FERRITIN, TIBC, IRON, RETICCTPCT in the last 72 hours. Sepsis Labs: No results for input(s): PROCALCITON, LATICACIDVEN in the last 168 hours.  Recent Results (from the past 240 hour(s))  SARS Coronavirus 2 by RT PCR (hospital order, performed  in [place] lab) Nasopharyngeal Nasopharyngeal Swab     Status: None   Collection Time: 09/27/19  8:11 PM   Specimen: Nasopharyngeal Swab  Result Value Ref Range Status   SARS Coronavirus 2 NEGATIVE NEGATIVE Final    Comment: (NOTE) SARS-CoV-2 target nucleic acids are NOT DETECTED.  The SARS-CoV-2 RNA is generally detectable in upper and lower respiratory specimens during the acute phase of infection. The lowest concentration of SARS-CoV-2 viral copies this assay can detect is 250 copies / mL. A negative result does not preclude SARS-CoV-2 infection and should not be used as the sole basis for treatment or other patient management decisions.  A negative result may occur with improper specimen collection / handling, submission of specimen other than nasopharyngeal swab, presence of viral mutation(s) within the areas targeted by this assay, and  inadequate number of viral copies (<250 copies / mL). A negative result must be combined with clinical observations, patient history, and epidemiological information.  Fact Sheet for Patients:   StrictlyIdeas.no  Fact Sheet for Healthcare Providers: BankingDealers.co.za  This test is not yet approved or  cleared by the [place] FDA and has been authorized for detection and/or diagnosis of SARS-CoV-2 by FDA under an Emergency Use Authorization (EUA).  This EUA will remain in effect (meaning this test can be used) for the duration of the COVID-19 declaration under Section 564(b)(1) of the Act, 21 U.S.C. section 360bbb-3(b)(1), unless the authorization is terminated or revoked sooner.  Performed at [facility], [place] [address] [place], [place] [address]   CSF culture     Status: None (Preliminary result)   Collection Time: 09/28/19  5:06 PM   Specimen: PATH Cytology CSF; Cerebrospinal Fluid  Result Value Ref Range Status   Specimen Description CSF  Final   Special Requests NONE  Final   Gram Stain   Final    WBC PRESENT, PREDOMINANTLY PMN NO ORGANISMS SEEN CYTOSPIN SMEAR Performed at [facility], [place] [address], [place], [place] [address]    Culture PENDING  Incomplete   Report Status PENDING  Incomplete  Acid Fast Smear (AFB)     Status: None   Collection Time: 09/28/19  5:06 PM   Specimen: PATH Cytology CSF; Cerebrospinal Fluid  Result Value Ref Range Status   AFB Specimen Processing Concentration  Final   Acid Fast Smear Negative  Final    Comment: (NOTE) Performed At: [facility] [place], [place] [id number] Rush Farmer MD NO:[id number]    Source (AFB) CSF  Final    Comment: Performed at [facility], [place] [address] [place], [place] [address]         Radiology Studies: EEG  Result Date: 09/28/2019 Lora Havens, MD     09/28/2019  1:09 PM Patient Name:  SOLVEIG FANGMAN MRN: [id number] Epilepsy Attending: Lora Havens Referring Physician/Provider: Dr Kerney Elbe Date: 09/28/2019 Duration: 23.36 mins Patient history: 69 y.o.femalewitha recentpresentation at an OSH in [place] for what was diagnosed as a stroke (08/2019) in the context of atypical imaging appearance on MRI, who re-presented [facility] on Thursday as a code stroke for Danielle sidedweakness and facial droop,worsened relative to her recent newbaseline. EEG to evaluate for seizure. Level of alertness: Awake, drowsy AEDs during EEG study: None Technical aspects: This EEG study was done with scalp electrodes positioned according to the 10-20 International system of electrode placement. Electrical activity was acquired at a sampling rate of '500Hz'  and reviewed  with a high frequency filter of '70Hz'  and a low frequency filter of '1Hz' . EEG data were recorded continuously and digitally stored. Description: The posterior dominant rhythm consists of 9 Hz activity of moderate voltage (25-35 uV) seen predominantly in posterior head regions, symmetric and reactive to eye opening and eye closing. Drowsiness was characterized by attenuation of the posterior background rhythm. EEG showed intermittent 3 to 6 Hz theta-delta slowing in right hemisphere.  Hyperventilation and photic stimulation were not performed.   ABNORMALITY -Intermittent slow, right hemisphere IMPRESSION: This study is suggestive of cortical dysfunction in right hemisphere nonspecific etiology but likely related to underlying cortical abnormality. No seizures or epileptiform discharges were seen throughout the recording. [place]   DG FLUORO GUIDE LUMBAR PUNCTURE  Result Date: 09/28/2019 CLINICAL DATA:  Danielle-sided Fox, abnormal MRI EXAM: DIAGNOSTIC LUMBAR PUNCTURE UNDER FLUOROSCOPIC GUIDANCE FLUOROSCOPY TIME:  Fluoroscopy Time:  18 seconds Radiation Exposure Index (if provided by the fluoroscopic device): 1.4 mGy PROCEDURE:  Informed consent was obtained from the patient prior to the procedure, including potential complications of headache, allergy, and pain. With the patient prone, the lower back was prepped with Betadine. 1% Lidocaine was used for local anesthesia. Lumbar puncture was performed at the L3-L4 level using a 22 gauge needle with return of clear CSF. 13 ml of CSF were obtained for laboratory studies. The patient tolerated the procedure well and there were no apparent complications. IMPRESSION: Technically successful fluoroscopic guided lumbar puncture. Electronically Signed   By: Macy Mis M.D.   On: 09/28/2019 17:24   Scheduled Meds: . aspirin EC  81 mg Oral Daily  . atorvastatin  40 mg Oral QHS  . calcium carbonate  1 tablet Oral Q breakfast  . clopidogrel  75 mg Oral Daily  . enoxaparin (LOVENOX) injection  40 mg Subcutaneous Q24H  . feeding supplement (ENSURE ENLIVE)  237 mL Oral BID BM  . fluticasone furoate-vilanterol  1 puff Inhalation Daily  . pantoprazole  40 mg Oral Daily   Continuous Infusions:    LOS: 3 days   Time spent: 54mn  Cheral Cappucci C Laterrica Libman, DO [facility]  If 7PM-7AM, please contact night-coverage [email or web address]  09/30/2019, 7:34 AM

## 2019-09-30 NOTE — Progress Notes (Addendum)
Subjective: No complaints.  Objective: Current vital signs: BP (!) 108/60 (BP Location: Right Arm)   Pulse 79   Temp 98 F (36.7 C) (Oral)   Resp 15   Ht '5\' 1"'  (1.549 m)   Wt 52 kg   SpO2 98%   BMI 21.66 kg/m  Vital signs in last 24 hours: Temp:  [97.8 F (36.6 C)-99.1 F (37.3 C)] 98 F (36.7 C) (08/01 0415) Pulse Rate:  [77-92] 79 (08/01 0415) Resp:  [15-18] 15 (08/01 0415) BP: (104-115)/(49-66) 108/60 (08/01 0415) SpO2:  [98 %-99 %] 98 % (08/01 0415)  Intake/Output from previous day: No intake/output data recorded. Intake/Output this shift: No intake/output data recorded. Nutritional status:  Diet Order            Diet Heart Room service appropriate? Yes; Fluid consistency: Thin  Diet effective now                Exam: HEENT: [place]/AT Lungs: Respirations unlabored Ext: No edema  Neurologic Exam: Ment: Awake, alert and oriented. Speech fluent without evidence for aphasia.  CN: EOMI. Fixates and tracks normally. No left facial droop today.  Motor: 5/5 BUE and BLE. Subtle decreased coordination of left hand/fingers.  Cerebellar: No ataxia noted.   Lab Results: Results for orders placed or performed during the hospital encounter of 09/27/19 (from the past 48 hour(s))  Glucose, CSF     Status: None   Collection Time: 09/28/19  5:06 PM  Result Value Ref Range   Glucose, CSF 42 40 - 70 mg/dL    Comment: Performed at [facility] [address] [place], [place] [address]  Protein, CSF     Status: Abnormal   Collection Time: 09/28/19  5:06 PM  Result Value Ref Range   Total  Protein, CSF 50 (H) 15 - 45 mg/dL    Comment: Performed at [facility] [address] [place], [place] [address]  CSF cell count with differential     Status: Abnormal   Collection Time: 09/28/19  5:06 PM  Result Value Ref Range   Tube # 4    Color, CSF COLORLESS COLORLESS   Appearance, CSF CLEAR CLEAR   Supernatant NOT INDICATED    RBC Count, CSF 3 (H) 0 /cu mm   WBC,  CSF 43 (HH) 0 - 5 /cu mm    Comment: CRITICAL RESULT CALLED TO, READ BACK BY AND VERIFIED WITH: L.RUDD RN 1853 09/28/19 MCCORMICK K    Segmented Neutrophils-CSF 19 (H) 0 - 6 %   Lymphs, CSF 55 40 - 80 %   Monocyte-Macrophage-Spinal Fluid 26 15 - 45 %   Eosinophils, CSF 0 0 - 1 %    Comment: Performed at [facility] [address] [place], [place] [address]  CSF culture     Status: None (Preliminary result)   Collection Time: 09/28/19  5:06 PM   Specimen: PATH Cytology CSF; Cerebrospinal Fluid  Result Value Ref Range   Specimen Description CSF    Special Requests NONE    Gram Stain      WBC PRESENT, PREDOMINANTLY PMN NO ORGANISMS SEEN CYTOSPIN SMEAR Performed at [facility] [address], [place], [place] [address]    Culture PENDING    Report Status PENDING   Cryptococcal antigen, CSF     Status: None   Collection Time: 09/28/19  5:06 PM  Result Value Ref Range   Crypto Ag NEGATIVE NEGATIVE   Cryptococcal Ag Titer  NOT INDICATED NOT INDICATED    Comment: Performed at [facility], [place] [address], [place], [place] [address]  Acid Fast Smear (AFB)     Status: None   Collection Time: 09/28/19  5:06 PM   Specimen: PATH Cytology CSF; Cerebrospinal Fluid  Result Value Ref Range   AFB Specimen Processing Concentration    Acid Fast Smear Negative     Comment: (NOTE) Performed At: [facility] [address] [place], [place] [id number] Rush Farmer MD OV:[id number]    Source (AFB) CSF     Comment: Performed at [facility], [place] [address], [place], [place] [address]    Recent Results (from the past 240 hour(s))  SARS Coronavirus 2 by RT PCR (hospital order, performed in [facility] hospital lab) Nasopharyngeal Nasopharyngeal Swab     Status: None   Collection Time: 09/27/19  8:11 PM   Specimen: Nasopharyngeal Swab  Result Value Ref Range Status   SARS Coronavirus 2 NEGATIVE NEGATIVE Final    Comment: (NOTE) SARS-CoV-2 target nucleic  acids are NOT DETECTED.  The SARS-CoV-2 RNA is generally detectable in upper and lower respiratory specimens during the acute phase of infection. The lowest concentration of SARS-CoV-2 viral copies this assay can detect is 250 copies / mL. A negative result does not preclude SARS-CoV-2 infection and should not be used as the sole basis for treatment or other patient management decisions.  A negative result may occur with improper specimen collection / handling, submission of specimen other than nasopharyngeal swab, presence of viral mutation(s) within the areas targeted by this assay, and inadequate number of viral copies (<250 copies / mL). A negative result must be combined with clinical observations, patient history, and epidemiological information.  Fact Sheet for Patients:   StrictlyIdeas.no  Fact Sheet for Healthcare Providers: BankingDealers.co.za  This test is not yet approved or  cleared by the [place] FDA and has been authorized for detection and/or diagnosis of SARS-CoV-2 by FDA under an Emergency Use Authorization (EUA).  This EUA will remain in effect (meaning this test can be used) for the duration of the COVID-19 declaration under Section 564(b)(1) of the Act, 21 U.S.C. section 360bbb-3(b)(1), unless the authorization is terminated or revoked sooner.  Performed at [facility], [place] [address], [place], [place] [address]   CSF culture     Status: None (Preliminary result)   Collection Time: 09/28/19  5:06 PM   Specimen: PATH Cytology CSF; Cerebrospinal Fluid  Result Value Ref Range Status   Specimen Description CSF  Final   Special Requests NONE  Final   Gram Stain   Final    WBC PRESENT, PREDOMINANTLY PMN NO ORGANISMS SEEN CYTOSPIN SMEAR Performed at [facility], [place] [address], [place], [place] [address]    Culture PENDING  Incomplete   Report Status PENDING  Incomplete  Acid Fast Smear (AFB)      Status: None   Collection Time: 09/28/19  5:06 PM   Specimen: PATH Cytology CSF; Cerebrospinal Fluid  Result Value Ref Range Status   AFB Specimen Processing Concentration  Final   Acid Fast Smear Negative  Final    Comment: (NOTE) Performed At: [facility] [place], [place] [id number] Rush Farmer MD HU:[id number]    Source (AFB) CSF  Final    Comment: Performed at [facility], [place] [address], [place], [place] [address]    Lipid Panel Recent Labs    09/28/19 [phone]  CHOL 121  TRIG 70  HDL 47  CHOLHDL 2.6  VLDL 14  LDLCALC 60    Studies/Results: EEG  Result Date: 09/28/2019 Lora Havens, MD     09/28/2019  1:09 PM Patient Name: Danielle Fox MRN: [id number] Epilepsy Attending: Lora Havens Referring Physician/Provider: Dr Kerney Elbe Date: 09/28/2019 Duration: 23.36 mins Patient history: 69 y.o.femalewitha recentpresentation at an OSH in [place] for what was diagnosed as a stroke (08/2019) in the context of atypical imaging appearance on MRI, who re-presented [facility] on Thursday as a code stroke for left sidedweakness and facial droop,worsened relative to her recent newbaseline. EEG to evaluate for seizure. Level of alertness: Awake, drowsy AEDs during EEG study: None Technical aspects: This EEG study was done with scalp electrodes positioned according to the 10-20 International system of electrode placement. Electrical activity was acquired at a sampling rate of '500Hz'  and reviewed with a high frequency filter of '70Hz'  and a low frequency filter of '1Hz' . EEG data were recorded continuously and digitally stored. Description: The posterior dominant rhythm consists of 9 Hz activity of moderate voltage (25-35 uV) seen predominantly in posterior head regions, symmetric and reactive to eye opening and eye closing. Drowsiness was characterized by attenuation of the posterior background rhythm. EEG showed intermittent 3 to 6 Hz  theta-delta slowing in right hemisphere.  Hyperventilation and photic stimulation were not performed.   ABNORMALITY -Intermittent slow, right hemisphere IMPRESSION: This study is suggestive of cortical dysfunction in right hemisphere nonspecific etiology but likely related to underlying cortical abnormality. No seizures or epileptiform discharges were seen throughout the recording. [place]   DG FLUORO GUIDE LUMBAR PUNCTURE  Result Date: 09/28/2019 CLINICAL DATA:  Left-sided weakness, abnormal MRI EXAM: DIAGNOSTIC LUMBAR PUNCTURE UNDER FLUOROSCOPIC GUIDANCE FLUOROSCOPY TIME:  Fluoroscopy Time:  18 seconds Radiation Exposure Index (if provided by the fluoroscopic device): 1.4 mGy PROCEDURE: Informed consent was obtained from the patient prior to the procedure, including potential complications of headache, allergy, and pain. With the patient prone, the lower back was prepped with Betadine. 1% Lidocaine was used for local anesthesia. Lumbar puncture was performed at the L3-L4 level using a 22 gauge needle with return of clear CSF. 13 ml of CSF were obtained for laboratory studies. The patient tolerated the procedure well and there were no apparent complications. IMPRESSION: Technically successful fluoroscopic guided lumbar puncture. Electronically Signed   By: Macy Mis M.D.   On: 09/28/2019 17:24    Medications:  Prior to Admission:  Medications Prior to Admission  Medication Sig Dispense Refill Last Dose  . aspirin 81 MG EC tablet Take 1 tablet (81 mg total) by mouth in the morning and at bedtime. 30 tablet  09/26/2019 at Unknown time  . atorvastatin (LIPITOR) 40 MG tablet Take 1 tablet by mouth at bedtime.   09/26/2019 at Unknown time  . budesonide-formoterol (SYMBICORT) 80-4.5 MCG/ACT inhaler Inhale 2 puffs into the lungs in the morning and at bedtime. (Patient taking differently: Inhale 2 puffs into the lungs 2 (two) times daily as needed (wheezing, shortness of breath). ) 30.6 g 1 Past  Month at Unknown time  . esomeprazole (NEXIUM) 40 MG capsule Take 1 capsule (40 mg total) by mouth daily before breakfast. 90 capsule 3 09/26/2019 at Unknown time  . albuterol (PROVENTIL HFA;VENTOLIN HFA) 108 (90 Base) MCG/ACT inhaler Inhale 2 puffs into the lungs every 6 (six) hours as needed for wheezing or shortness of breath. (Patient not taking: Reported on 09/27/2019) 1 Inhaler 2 Not  Taking at Unknown time  . cetirizine (ZYRTEC) 10 MG tablet Take 1 tablet (10 mg total) by mouth daily. (Patient not taking: Reported on 09/27/2019) 30 tablet 11 Not Taking at Unknown time   Scheduled: . aspirin EC  81 mg Oral Daily  . atorvastatin  40 mg Oral QHS  . calcium carbonate  1 tablet Oral Q breakfast  . clopidogrel  75 mg Oral Daily  . enoxaparin (LOVENOX) injection  40 mg Subcutaneous Q24H  . feeding supplement (ENSURE ENLIVE)  237 mL Oral BID BM  . fluticasone furoate-vilanterol  1 puff Inhalation Daily  . pantoprazole  40 mg Oral Daily    MRI brain with/without contrast, 09/22/19:Cortical/sulcal hyperintensity within the anterior right hemisphere on the axial FLAIR sequence. According to the report from MRI performed 09/15/2019, this finding was also present at that time. This may be a late subacute sequela of an ischemic event. No abnormal contrast enhancement is visible on the current study, though it was reportedly present on the prior examination.  CT head (7/19): No acute intracranial abnormality  CT head (7/29): No acute intracranial hemorrhage or evidence acute infarction.   CTA head and neck (7/29): No large vessel occlusion. 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 Plaque ulceration is also noted.   Echocardiogram (7/29): Left ventricular ejection fraction, by estimation, is 60 to 65%. The left ventricle has normal function. The left ventricle has no regional wall motion abnormalities.No mural thrombus described in the  report.  EEG (7/30): ABNORMALITY: Intermittent slow, right hemisphere. IMPRESSION: This study is suggestive ofcortical dysfunction in right hemispherenonspecific etiology but likely related to underlying cortical abnormality.No seizures or epileptiform discharges were seen throughout the recording.  Assessment:69 y.o.femalewitha recentpresentation at an OSH in [place] for what was diagnosed as a stroke(08/2019)in the context of atypical imaging appearance on MRI,[facility] left sidedweakness and facial droop,worsened relative to herrecent newbaseline. --MRI brain from Thursday (7/29) showed persistent abnormal FLAIR signal in the sulci over the superior right frontal convexity, first reported on 09/15/2019 and reportedly with abnormal enhancement at that time. But no evidence of developing encephalomalacia, no evidence of blood products on CT or MRI, and no convincing gyral/parenchymal signal or diffusion abnormality (although questionable abnormal leptomeningeal appearance there on DWI). Similar abnormal sulci in the left cingulate and at the left parieto-occipital junction. But other CSF spaces appear normal.The etiology is unclear and CSF analysis is recommended. 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 -- ESR was elevated at 55 -- Findings on MRI are highly atypical for stroke. DDx for her persisting cortically-based lesions on MRI now more weighted towards an inflammatory process (rheumatoid meningitis), relatively indolent infectious process (syphilis, TB, fungal meningitis), malignancy (leptomeningeal carcinomatosis) and cortical edema from possible subclinical seizures. -- Fluoro-guided LP obtained on Friday. CSF was abnormal, with elevated WBC of 43 (19% segmented neutrophils present, suggestive of infection), elevated protein of 50 and  glucose of 42, which most likely is below the normal value of 0.6 x serum glucose given serum glucose of 89 three hours earlier.  -- Gram stain was with no organisms seen. Cryptococcal Ag negative.  -- AFB smear is negative. AFB culture is pending.   -- HIV negative -- High-resolution CT of chest which was ordered on Saturday (assess for sarcoidosis, TB and primary malignancy) is pending -- ID was consulted on Saturday. Per ID she  has no RF to suggest invasive fungal infection, nor are there lesions in new regions not previously noted on prior imaging. ID has low suspicion for indolent infection. ID does not feel that steroids are contraindicated; they could be started if there is an intermittent exacerbation of symptoms. ID will continue to follow up with her csf culture Recommended to get quantiferon gold.  -- Images from MRIs reviewed at the bedside today with patient and her partner  Recommendations: --CSF cytology is pending -- CSF lab sendouts pending: VDRL, IgG index, oligoclonal bands, Lyme titers (confirmed that these were sent to [facility]) -- CSF ID labs in process: Bacterial and fungal cultures   -- Serum RMSF titers pending -- Serum ACE level (ordered) -- Continue ASA, Plavix and statin -- CT of abdomen and pelvis to evaluate for possible primary malignancy (ordered) -- Quantiferon Gold TB test has been ordered -- EBV PCR has been added to CSF labs  Notes on DDx for leptomeningeal carcinomatosis: The most common primary sites for metastatic disease to the leptomeninges are as follows: -Breast cancer, particularly infiltrating lobular carcinoma -Lung cancer -Melanoma -Gastrointestinal cancers (gastric carcinoma and colorectal cancer) -Lymphoma/leukemia     LOS: 3 days   '@Electronically'  signed: Dr. Kerney Elbe 09/30/2019  7:46 AM

## 2019-09-30 NOTE — Evaluation (Signed)
Speech Language Pathology Evaluation Patient Details Name: Danielle Fox MRN: [id number] DOB: 01-03-1951 Today's Date: 09/30/2019 Time: 8366-2947 SLP Time Calculation (min) (ACUTE ONLY): 28 min  Problem List:  Patient Active Problem List   Diagnosis Date Noted  . CVA (cerebral vascular accident) ([place]) 09/27/2019  . H/O: CVA (cerebrovascular accident) 09/19/2019  . Chills 09/19/2019  . Hypokalemia 09/19/2019  . Clostridioides difficile infection   . Diarrhea 09/24/2018  . Vitamin D deficiency 01/02/2017  . Welcome to Medicare preventive visit 06/29/2016  . Pruritus 06/29/2016  . Abnormal CT scan 08/14/2015  . Constipation 10/06/2014  . 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 Rib pain on left side 09/08/2014  . Chronic cough 06/14/2014  . SOB (shortness of breath) 06/14/2014  . Cough 05/20/2014  . Hyperlipidemia, mild 08/15/2011  . Osteopenia 03/01/2011  . History of smoking 03/01/2011  . Musculoskeletal pain 03/01/2011   Past Medical History:  Past Medical History:  Diagnosis Date  . Allergy   . Clostridioides difficile infection 10/19/2018   tested 10/2018 negative  . Colon polyps   . Constipation 10/06/2014  . COPD (chronic obstructive pulmonary disease) ([place])   . Osteopenia   . 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 Preventative health care 06/29/2016  . Pruritus 06/29/2016  . Welcome to Medicare preventive visit 06/29/2016   Past Surgical History:  Past Surgical History:  Procedure Laterality Date  . APPENDECTOMY    . COLONOSCOPY     01-14-2005  . SINUS IRRIGATION  04/02/2015  . UPPER GASTROINTESTINAL ENDOSCOPY  11/22/2018   HPI:  Danielle Fox is a 69 y.o. female with medical history significant of recent stroke on 7/17, prediabetes, mild intermittent COPD, HLD, presented with worsening of stroke symptoms. Head MRI: Persistent abnormal FLAIR signal in the sulci over the superiorright frontal convexity, first reported on 09/15/2019 and reportedlywith abnormal enhancement at  that time.   Assessment / Plan / Recommendation Clinical Impression  Danielle Fox demonstrates speech/language/cognitive abilities WNL and at baseline for her functioning. She was assessed with the [place] Status Examination with a total score of 29/30. Single error was in delayed recall of random words. She participated in informal evaluation of medication management with 100% acc'y on frequency/dosage questions. She also answered judgment/safety questions with 100% acc'y. Speech was clear and fluent with no word finding errors or hesitations. Pt is anxious to return home. At this time, no further ST needs are indicated.    SLP Assessment  SLP Recommendation/Assessment: Patient does not need any further Speech Lanaguage Pathology Services SLP Visit Diagnosis: Cognitive communication deficit (R41.841)    Follow Up Recommendations  None       SLP Evaluation Cognition  Overall Cognitive Status: Within Functional Limits for tasks assessed Arousal/Alertness: Awake/alert Orientation Level: Oriented X4 Attention: Focused Focused Attention: Appears intact Memory: Appears intact Awareness: Appears intact Problem Solving: Appears intact Executive Function: Reasoning;Organizing;Decision Making Reasoning: Appears intact Organizing: Appears intact Decision Making: Appears intact Safety/Judgment: Appears intact       Comprehension  Auditory Comprehension Overall Auditory Comprehension: Appears within functional limits for tasks assessed Yes/No Questions: Within Functional Limits Commands: Within Functional Limits    Expression Expression Primary Mode of Expression: Verbal Verbal Expression Overall Verbal Expression: Appears within functional limits for tasks assessed Initiation: No impairment Repetition: No impairment Naming: No impairment Pragmatics: No impairment Written Expression Dominant Hand: Right Written Expression: Within Functional Limits   Oral /  Motor  Oral Motor/Sensory Function Overall Oral Motor/Sensory  Function: Within functional limits Motor Speech Overall Motor Speech: Appears within functional limits for tasks assessed Respiration: Within functional limits Phonation: Normal Resonance: Within functional limits Articulation: Within functional limitis Intelligibility: Intelligible                     Lariah Fleer P. Yaritzi Craun, M.S., [place] Pathologist Acute Rehabilitation Services Pager: [place] 09/30/2019, 1:53 PM

## 2019-10-01 DIAGNOSIS — C801 Malignant (primary) neoplasm, unspecified: Secondary | ICD-10-CM

## 2019-10-01 DIAGNOSIS — N9489 Other specified conditions associated with female genital organs and menstrual cycle: Secondary | ICD-10-CM

## 2019-10-01 DIAGNOSIS — C7949 Secondary malignant neoplasm of other parts of nervous system: Secondary | ICD-10-CM

## 2019-10-01 DIAGNOSIS — G039 Meningitis, unspecified: Secondary | ICD-10-CM

## 2019-10-01 DIAGNOSIS — R1909 Other intra-abdominal and pelvic swelling, mass and lump: Secondary | ICD-10-CM

## 2019-10-01 DIAGNOSIS — G038 Meningitis due to other specified causes: Secondary | ICD-10-CM

## 2019-10-01 LAB — IGG CSF INDEX
Albumin CSF-mCnc: 28 mg/dL (ref 8–37)
Albumin: 3.1 g/dL — ABNORMAL LOW (ref 3.8–4.8)
CSF IgG Index: 0.9 — ABNORMAL HIGH (ref 0.0–0.7)
IgG (Immunoglobin G), Serum: 741 mg/dL (ref 586–1602)
IgG, CSF: 5.9 mg/dL (ref 0.0–6.7)
IgG/Alb Ratio, CSF: 0.21 (ref 0.00–0.25)

## 2019-10-01 LAB — B. BURGDORFI ANTIBODIES: B burgdorferi Ab IgG+IgM: <0.91 {ISR} (ref 0.00–0.90)

## 2019-10-01 LAB — ANGIOTENSIN CONVERTING ENZYME
Angiotensin-Converting Enzyme: 23 U/L (ref 14–82)
Angiotensin-Converting Enzyme: 21 U/L (ref 14–82)

## 2019-10-01 LAB — CSF CULTURE W GRAM STAIN: Culture: NO GROWTH

## 2019-10-01 LAB — VDRL, CSF: VDRL Quant, CSF: NONREACTIVE

## 2019-10-01 LAB — CYTOLOGY - NON PAP

## 2019-10-01 MED ORDER — CLOPIDOGREL BISULFATE 75 MG PO TABS: 75.0000 mg | ORAL_TABLET | Freq: Every day | ORAL | Status: AC

## 2019-10-01 MED ORDER — GABAPENTIN 100 MG PO CAPS
300.0000 mg | ORAL_CAPSULE | Freq: Three times a day (TID) | ORAL | Status: DC
Start: 2019-10-01 — End: 2019-12-05

## 2019-10-01 NOTE — Progress Notes (Signed)
Occupational Therapy Treatment Patient Details Name: Danielle Fox MRN: [id number] DOB: 17-Jul-1950 Today's Date: 10/01/2019    History of present illness Pt is a 69 y.o. female with PMH of recent stroke on 7/17, prediabetes, mild intermittent COPD, hypokalemia, dyspnea, HLD, presented with worsening of stroke symptoms (L sided weakness). 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 DDx for her persisting cortically-based lesions on MRI now more weighted towards an inflammatory process (rheumatoid meningitis), relatively indolent infectious process (syphilis, TB, fungal meningitis), malignancy (leptomeningeal carcinomatosis) and cortical edema from possible subclinical seizures.   OT comments  Upon arrival pt sitting EOB with husband present in room. Pt discussed given HEP with therapist and gathered items given last session. Pt reports she has been completing HEP but took a break yesterday due to all the testing she had done. Pt with good recall of exercises and expressing she would like more exercises to get her hand back to 100%. Therapist printed advanced and bilateral putty exercises for pt to complete along with other HEPs. Believe dc plans remain appropriate for pt. Will follow acutely.   Follow Up Recommendations  Outpatient OT    Equipment Recommendations  None recommended by OT           Mobility Bed Mobility               General bed mobility comments: pt sitting EOB upon arrival  Transfers                           Cognition Arousal/Alertness: Awake/alert Behavior During Therapy: WFL for tasks assessed/performed Overall Cognitive Status: Within Functional Limits for tasks assessed                                 General Comments: Pt A&Ox4. Follows commands well. Good recall of exercises from  previous OT session.        Exercises Exercises: Other exercises Other Exercises Other Exercises: Mass grasp, palmar pinch, finger extension, and thumb flexion with putty Other Exercises: finger abduction, finger adduction, thumb extension, thumb abduction, wrist extension, and wrist flexion with putty Other Exercises: two handed key pinch, two handed grasp pinch, and two handed palmar pinch with putty      General Comments Pt recalled exercises from HEP and expressed interest in more HEPs to increase coordination. Pt;s husband present for treatment.      Frequency  Min 2X/week        Progress Toward Goals  OT Goals(current goals can now be found in the care plan section)  Progress towards OT goals: Progressing toward goals  Acute Rehab OT Goals Patient Stated Goal: go home OT Goal Formulation: With patient Time For Goal Achievement: 10/12/19 Potential to Achieve Goals: Good  Plan Discharge plan remains appropriate          End of Session    OT Visit Diagnosis: Muscle weakness (generalized) (M62.81);Other symptoms and signs involving the nervous system (R29.898)   Activity Tolerance Patient tolerated treatment well   Patient Left with family/visitor present   Nurse Communication Mobility status        Time: 4193-7902 OT Time Calculation (min): 20 min  Charges: OT General Charges $OT Visit: 1 Visit OT Treatments $Neuromuscular Re-education: 8-22 mins  Eddis Pingleton/OTS  Yemaya Barnier 10/01/2019, 10:21 AM

## 2019-10-01 NOTE — Progress Notes (Addendum)
Subjective: No acute overnight events.   Exam: Vitals:   10/01/19 0412 10/01/19 0441  BP: 113/72 116/74  Pulse: 99   Resp: 17   Temp: (!) 101.6 F (38.7 C) 99.6 F (37.6 C)  SpO2: 97% 98%   Gen: In bed, NAD Resp: non-labored breathing, no acute distress Abd: soft, nt  Neuro: MS: Awake, alert, oriented and appropriate CN: Pupils equal round and reactive, visual fields are full Motor: She has very mild left pronator drift with impaired fine motor movements in the left arm, but 5/5 throughout on consultation. Sensory: Intact light touch   Pertinent Labs: CSF results:  WBC 43, 19% segmented neutrophils, 55% lymphs, 26% monocytes, 0% eosinophils  RBC 3  Glucose 42  Protein 50  CSF Gram stain/culture negative Gram stain and no growth at 3 days.  Crypto antigen-negative  Fungus culture-pending  OC bands-pending  IgG index-pending  VDRL-pending  Cytology-pending  AFB smear-negative   RMSF pending Lyme titers negative ACE - normal  Impression: 69 year old female with recurrent episodes of left-sided weakness in the setting of abnormal brain MRI.  The etiology of these changes is currently unclear, with possibilities including inflammatory versus carcinomatous etiology, or less likely infectious.  Given her mild symptoms, I do not feel strongly about pursuing steroids at this time.  Given that she was previously seen to have enhancement, and on repeat scan it was not enhancing, I do think there may be reason to suspect that might indicate an improvement, but being able to see this directly would be helpful..  With recurrent stereotyped spells of left-sided clenching, I am concerned that these could be either seizure or focal dystonia.  Either way, she may get some benefit from gabapentin and therefore I would recommend starting this at this time.  She is currently asking to be discharged, and given the mildness of her symptoms, I do think she could pursue further work-up for this  as an outpatient.  Recommendations: 1) Send SSA, SSB, ANCA 2) Consider workup of adnexal mass per IM 3) F/u cytology, if negative, consider repeating LP for repeat cytology. 4) gabapentin 300mg  TID 5) repeat imaging in 3 - 4 weeks or sooner if symptoms worsen.  6) we will attempt to obtain outside scans.  Roland Rack, MD [facility] [phone]  If 7pm- 7am, please page neurology on call as listed in [place].

## 2019-10-01 NOTE — Progress Notes (Signed)
Subjective: No new complaints   Antibiotics:  Anti-infectives (From admission, onward)   None      Medications: Scheduled Meds: . aspirin EC  81 mg Oral Daily  . atorvastatin  40 mg Oral QHS  . calcium carbonate  1 tablet Oral Q breakfast  . clopidogrel  75 mg Oral Daily  . enoxaparin (LOVENOX) injection  40 mg Subcutaneous Q24H  . feeding supplement (ENSURE ENLIVE)  237 mL Oral BID BM  . fluticasone furoate-vilanterol  1 puff Inhalation Daily  . pantoprazole  40 mg Oral Daily   Continuous Infusions: PRN Meds:.acetaminophen **OR** acetaminophen (TYLENOL) oral liquid 160 mg/5 mL **OR** acetaminophen, albuterol, loratadine, LORazepam, ondansetron (ZOFRAN) IV, senna-docusate    Objective: Weight change:   Intake/Output Summary (Last 24 hours) at 10/01/2019 1513 Last data filed at 09/30/2019 1600 Gross per 24 hour  Intake 240 ml  Output --  Net 240 ml   Blood pressure 106/67, pulse 86, temperature 97.9 F (36.6 C), temperature source Oral, resp. rate 16, height 5\' 1"  (1.549 m), weight 52 kg, SpO2 99 %. Temp:  [97.9 F (36.6 C)-101.6 F (38.7 C)] 97.9 F (36.6 C) (08/02 1217) Pulse Rate:  [86-99] 86 (08/02 1217) Resp:  [16-17] 16 (08/02 1217) BP: (102-116)/(46-74) 106/67 (08/02 1217) SpO2:  [97 %-99 %] 99 % (08/02 1217)  Physical Exam: General: Alert and awake, oriented x3, not in any acute distress. HEENT: anicteric sclera, EOMI CVS regular rate, normal  Chest: , no wheezing, no respiratory distress Abdomen: soft non-distended,  Extremities: no edema or deformity noted bilaterally  CBC:    BMET No results for input(s): NA, K, CL, CO2, GLUCOSE, BUN, CREATININE, CALCIUM in the last 72 hours.   Liver Panel  Recent Labs    09/28/19 1706  ALBUMIN 3.1*       Sedimentation Rate No results for input(s): ESRSEDRATE in the last 72 hours. C-Reactive Protein No results for input(s): CRP in the last 72 hours.  Micro Results: Recent Results  (from the past 720 hour(s))  Urine Culture     Status: None   Collection Time: 09/18/19  4:26 PM   Specimen: Urine  Result Value Ref Range Status   MICRO NUMBER: [id number]  Final   SPECIMEN QUALITY: Adequate  Final   Sample Source NOT GIVEN  Final   STATUS: FINAL  Final   ISOLATE 1:   Final    Growth of mixed flora was isolated, suggesting probable contamination. No further testing will be performed. If clinically indicated, recollection using a method to minimize contamination, with prompt transfer to Urine Culture Transport Tube, is  recommended.   SARS Coronavirus 2 by RT PCR (hospital order, performed in [facility] hospital lab) Nasopharyngeal Nasopharyngeal Swab     Status: None   Collection Time: 09/27/19  8:11 PM   Specimen: Nasopharyngeal Swab  Result Value Ref Range Status   SARS Coronavirus 2 NEGATIVE NEGATIVE Final    Comment: (NOTE) SARS-CoV-2 target nucleic acids are NOT DETECTED.  The SARS-CoV-2 RNA is generally detectable in upper and lower respiratory specimens during the acute phase of infection. The lowest concentration of SARS-CoV-2 viral copies this assay can detect is 250 copies / mL. A negative result does not preclude SARS-CoV-2 infection and should not be used as the sole basis for treatment or other patient management decisions.  A negative result may occur with improper specimen collection / handling, submission of specimen other than nasopharyngeal swab, presence of viral mutation(s)  within the areas targeted by this assay, and inadequate number of viral copies (<250 copies / mL). A negative result must be combined with clinical observations, patient history, and epidemiological information.  Fact Sheet for Patients:   StrictlyIdeas.no  Fact Sheet for Healthcare Providers: BankingDealers.co.za  This test is not yet approved or  cleared by the [place] FDA and has been authorized for detection and/or  diagnosis of SARS-CoV-2 by FDA under an Emergency Use Authorization (EUA).  This EUA will remain in effect (meaning this test can be used) for the duration of the COVID-19 declaration under Section 564(b)(1) of the Act, 21 U.S.C. section 360bbb-3(b)(1), unless the authorization is terminated or revoked sooner.  Performed at [facility], [place] [address], [place], [place] [address]   CSF culture     Status: None   Collection Time: 09/28/19  5:06 PM   Specimen: PATH Cytology CSF; Cerebrospinal Fluid  Result Value Ref Range Status   Specimen Description CSF  Final   Special Requests NONE  Final   Gram Stain   Final    WBC PRESENT, PREDOMINANTLY PMN NO ORGANISMS SEEN CYTOSPIN SMEAR    Culture   Final    NO GROWTH 3 DAYS Performed at [facility], [place] [address], [place], [place] [address]    Report Status 10/01/2019 FINAL  Final  Acid Fast Smear (AFB)     Status: None   Collection Time: 09/28/19  5:06 PM   Specimen: PATH Cytology CSF; Cerebrospinal Fluid  Result Value Ref Range Status   AFB Specimen Processing Concentration  Final   Acid Fast Smear Negative  Final    Comment: (NOTE) Performed At: [facility] [place], [place] [id number] Rush Farmer MD NL:[id number]    Source (AFB) CSF  Final    Comment: Performed at [facility], [place] [address], [place], [place] [address]    Studies/Results: CT ABDOMEN PELVIS W CONTRAST  Result Date: 09/30/2019 CLINICAL DATA:  69 year old with indeterminate findings on recent MRI brain, 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 CT is requested to evaluate for a possible primary malignancy. Surgical history includes appendectomy. EXAM: CT ABDOMEN AND PELVIS WITH CONTRAST TECHNIQUE: Multidetector CT imaging of the abdomen and pelvis was performed using the standard protocol following bolus administration of intravenous contrast. CONTRAST:  138mL OMNIPAQUE IOHEXOL 300 MG/ML IV.  COMPARISON:  None. FINDINGS: Lower chest: Heart size normal.  Visualized lung bases clear. Hepatobiliary: Liver normal in size and appearance. Gallbladder normal in appearance without calcified gallstones. No biliary ductal dilation. Pancreas: Normal in appearance without evidence of mass, ductal dilation, or inflammation. Spleen: Normal in size and appearance. Adrenals/Urinary Tract: Normal appearing adrenal glands. Kidneys normal in size and appearance without focal parenchymal abnormality. No hydronephrosis. No evidence of urinary tract calculi. Normal appearing decompressed urinary bladder. Stomach/Bowel: Stomach normal in appearance for the degree of distention. Normal-appearing small bowel. Moderate stool burden throughout the normal appearing colon. Surgically absent appendix. Vascular/Lymphatic: Moderate to severe atherosclerosis involving the abdominal aorta with calcified and noncalcified plaque. No evidence of abdominal aortic aneurysm. Moderate iliofemoral atherosclerosis bilaterally. Normal-appearing portal venous and systemic venous systems. No pathologic lymphadenopathy. Reproductive: Large cyst arising from the LEFT adnexum, with focal mild thickening and enhancement of its wall superiorly and inferiorly, associated with a thin septum inferiorly. The mass measures approximately 9.8 x 7.5 x 8.7 cm. Uterus not visualized and presumed surgically absent. Other: None. Musculoskeletal: Facet degenerative changes involving the lower lumbar spine.  Degenerative grade 1 spondylolisthesis of L4 on L5 measuring approximately 6 mm. No acute findings. IMPRESSION: 1. Approximate 10 cm complicated cyst arising from the LEFT adnexum, with focal mild thickening and enhancement of its wall superiorly and inferiorly, associated with a thin septum inferiorly. Transabdominal and transvaginal pelvic ultrasound is recommended in further evaluation to better characterize this mass. This recommendation follows ACR consensus  guidelines: White Paper of the ACR Incidental Findings Committee II on Adnexal Findings. J Am Coll Radiol 2013:10:675-681. 2. No acute abnormalities otherwise involving the abdomen or pelvis. 3. Degenerative grade 1 spondylolisthesis of L4 on L5 measuring approximately 6 mm. Aortic Atherosclerosis (ICD10-I70.0). Electronically Signed   By: Evangeline Dakin M.D.   On: 09/30/2019 14:20      Assessment/Plan:  INTERVAL HISTORY: CT abdomen with adnexal mass   Active Problems:   CVA (cerebral vascular accident) ([place])    Danielle Fox is a 69 y.o. female with inflammatory process involving CNS.  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  She has now been found on CT scan to have an adnexal mass  I would recommend biopsy of adnexal mass  of this to help solve underlying pathology  Would send material from this to path and for AFB and fungal cultures     LOS: 4 days   Alcide Evener 10/01/2019, 3:13 PM

## 2019-10-01 NOTE — TOC Transition Note (Signed)
Transition of Care [facility]) - CM/SW Discharge Note   Patient Details  Name: Danielle Fox MRN: [id number] Date of Birth: 1950-09-10  [facility] CM/SW Contact:  Pollie Friar, RN Phone Number: 10/01/2019, 2:54 PM   Clinical Narrative:    Pt discharging home with outpatient therapy. CM called [place] outpatient therapy and they verified receiving her orders.  Pt has supervision at home and transportation to home.    Final next level of care: OP Rehab Barriers to Discharge: No Barriers Identified   Patient Goals and CMS Choice     Choice offered to / list presented to : Patient  Discharge Placement                       Discharge Plan and Services   Discharge Planning Services: CM Consult                                 Social Determinants of Health (SDOH) Interventions     Readmission Risk Interventions No flowsheet data found.

## 2019-10-01 NOTE — Progress Notes (Signed)
NEUROLOGY CONSULTATION NOTE  Danielle Fox MRN: [id number] DOB: 08/15/1950  Referring provider: Penni Homans, MD Primary care provider: Penni Homans, MD  Reason for consult:  stroke  HISTORY OF PRESENT ILLNESS: Danielle Fox is a 69 year old right-handed female with COPD who presents for recurrent left sided weakness.  History supplemented by hospital and referring provider's notes.  She was [facility] to [facility] while in [place] for left arm weakness.  Specifically, she had trouble moving her arm and couldn't get her arm in the sleeve of her shirt while dressing.  CT head was negative but MRI of brain with and without constrast showed subtle enhancement involving the right posterior frontal lobe and anterior right parietal lobe, which was thought to be subacute cortical infarcts.  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  Echocardiogram was reportedly unremarkable.  LDL was 128, Hgb A1c 6 and TSH 0.976.  She was discharged on ASA 81mg  and atorvastatin 40mg  daily.    She returned home to [place].  On 09/27/2019, she woke up with left sided weakness and facial droop that lasted an hour.  Her left arm was flailing and didn't have control.  She had about 4 episodes.  She presented to [facility] where she was admitted for further evaluation.  CT head showed no acute intracranial abnormality.  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  MRI of brain with and without contrast showed persistent FLAIR signal in the sulci over the superior right frontal convexity and left cingulate sulci, although no associated abnormal enhancement and no evidence of stroke or blood.  Differential diagnosis was wide for infectious, malignant or autoimmune. She underwent lumbar puncture which demonstrated  Elevated cell count of 43, mildly  elevated protein 50,  Glucose 42, mildly elevated IgG index 0.9, negative culture, negative cryptococcal antigen, negative Acid Fast culture, cytology with benign reactive changes of increased lymphocytes and monocytes.  CSF VDRL, oligoclonal bands and fungal culture still pending.  Serum labs include negative ANA, elevated sed rate 55, non-reactive HIV, low vitamin D 18.27, negative Ethanol, elevated sed rate 55, negative SARS coronavirus 2 PCR, negative Lyme, ACE 23, negative SSA/SSB antibodies, Hgb A1c 5.9, LDL 60.  Pending serum labs include RF, ANCA, Quantiferon TB Gold, EBV PCR, RMSF antibodies.  UA and urine drug screen was negative.  EEG showed intermittent right hemispheric slowing.  CT of chest/abdomen/pelvis revealed no malignancy but did demonstrate a 10 cm cyst in the left adnexum.  As etiology was unknown and symptoms now mild, it was decided not to treat empirically with steroids.  Infectious disease was consulted and felt not to be infectious etiology.  She was started on gabapentin for presumed focal seizures vs dystonia.  At her request, she was discharged for further outpatient workup.  Since discharge, she has felt generalized weakness and fatigue.  She also has been nauseous as well with little appetite.   PAST MEDICAL HISTORY: Past Medical History:  Diagnosis Date  . Allergy   . Clostridioides difficile infection 10/19/2018   tested 10/2018 negative  . Colon polyps   . Constipation 10/06/2014  . COPD (chronic obstructive pulmonary disease) ([place])   . Osteopenia   . 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 Preventative health care 06/29/2016  . Pruritus 06/29/2016  . Welcome to Medicare preventive  visit 06/29/2016    PAST SURGICAL HISTORY: Past Surgical History:  Procedure Laterality Date  . APPENDECTOMY    . COLONOSCOPY     01-14-2005  . SINUS IRRIGATION  04/02/2015  . UPPER GASTROINTESTINAL ENDOSCOPY  11/22/2018    MEDICATIONS: Current Facility-Administered Medications on  File Prior to Visit  Medication Dose Route Frequency Provider Last Rate Last Admin  . acetaminophen (TYLENOL) tablet 650 mg  650 mg Oral Q4H PRN Wynetta Fines T, MD   650 mg at 10/01/19 0415   Or  . acetaminophen (TYLENOL) 160 MG/5ML solution 650 mg  650 mg Per Tube Q4H PRN Wynetta Fines T, MD       Or  . acetaminophen (TYLENOL) suppository 650 mg  650 mg Rectal Q4H PRN Wynetta Fines T, MD      . albuterol (PROVENTIL) (2.5 MG/3ML) 0.083% nebulizer solution 2.5 mg  2.5 mg Inhalation Q6H PRN Wynetta Fines T, MD      . aspirin EC tablet 81 mg  81 mg Oral Daily Wynetta Fines T, MD   81 mg at 09/30/19 0926  . atorvastatin (LIPITOR) tablet 40 mg  40 mg Oral QHS Wynetta Fines T, MD   40 mg at 09/30/19 2235  . calcium carbonate (OS-CAL - dosed in mg of elemental calcium) tablet 500 mg of elemental calcium  1 tablet Oral Q breakfast Wynetta Fines T, MD   500 mg of elemental calcium at 09/30/19 0858  . clopidogrel (PLAVIX) tablet 75 mg  75 mg Oral Daily Wynetta Fines T, MD   75 mg at 09/30/19 3009  . enoxaparin (LOVENOX) injection 40 mg  40 mg Subcutaneous Q24H Wynetta Fines T, MD   40 mg at 09/30/19 1629  . feeding supplement (ENSURE ENLIVE) (ENSURE ENLIVE) liquid 237 mL  237 mL Oral BID BM Little Ishikawa, MD      . fluticasone furoate-vilanterol (BREO ELLIPTA) 100-25 MCG/INH 1 puff  1 puff Inhalation Daily Lequita Halt, MD   1 puff at 09/28/19 [phone]  . loratadine (CLARITIN) tablet 10 mg  10 mg Oral Daily PRN Wynetta Fines T, MD      . LORazepam (ATIVAN) tablet 0.5 mg  0.5 mg Oral Q6H PRN Wynetta Fines T, MD      . ondansetron [facility]) injection 4 mg  4 mg Intravenous Q6H PRN Shalhoub, Sherryll Burger, MD   4 mg at 09/28/19 0742  . pantoprazole (PROTONIX) EC tablet 40 mg  40 mg Oral Daily Wynetta Fines T, MD   40 mg at 09/30/19 0762  . senna-docusate (Senokot-S) tablet 1 tablet  1 tablet Oral QHS PRN Lequita Halt, MD       Current Outpatient Medications on File Prior to Visit  Medication Sig Dispense Refill  . albuterol  (PROVENTIL HFA;VENTOLIN HFA) 108 (90 Base) MCG/ACT inhaler Inhale 2 puffs into the lungs every 6 (six) hours as needed for wheezing or shortness of breath. (Patient not taking: Reported on 09/27/2019) 1 Inhaler 2  . aspirin 81 MG EC tablet Take 1 tablet (81 mg total) by mouth in the morning and at bedtime. 30 tablet   . atorvastatin (LIPITOR) 40 MG tablet Take 1 tablet by mouth at bedtime.    . budesonide-formoterol (SYMBICORT) 80-4.5 MCG/ACT inhaler Inhale 2 puffs into the lungs in the morning and at bedtime. (Patient taking differently: Inhale 2 puffs into the lungs 2 (two) times daily as needed (wheezing, shortness of breath). ) 30.6 g 1  . cetirizine (ZYRTEC) 10 MG tablet Take  1 tablet (10 mg total) by mouth daily. (Patient not taking: Reported on 09/27/2019) 30 tablet 11  . esomeprazole (NEXIUM) 40 MG capsule Take 1 capsule (40 mg total) by mouth daily before breakfast. 90 capsule 3    ALLERGIES: Allergies  Allergen Reactions  . Doxycycline Nausea And Vomiting  . Erythromycin Base     Other reaction(s): GI Upset (intolerance)    FAMILY HISTORY: Family History  Problem Relation Age of Onset  . Heart disease Mother        cabg, MI  . Alcohol abuse Father   . Cirrhosis Father   . Heart disease Father        MI  . Colon cancer Neg Hx   . Colon polyps Neg Hx   . Esophageal cancer Neg Hx   . Stomach cancer Neg Hx   . Rectal cancer Neg Hx     SOCIAL HISTORY: Social History   Socioeconomic History  . Marital status: Significant Other    Spouse name: Not on file  . Number of children: 3  . Years of education: Not on file  . Highest education level: Not on file  Occupational History  . 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 Smoking status: Former Smoker    Packs/day: 1.00    Years: 48.00    Pack years: 48.00    Types: Cigarettes    Quit date: 02/19/2014    Years since quitting: 5.6  . Smokeless tobacco: Never Used  . Tobacco comment:  Quit and has no desire to smoke again,  Vaping Use  . Vaping Use: Never used  Substance and Sexual Activity  . Alcohol use: Yes    Alcohol/week: 0.0 standard drinks    Comment: socially  . Drug use: No  . Sexual activity: Yes    Partners: Male  Other Topics Concern  . Not on file  Social History Narrative   Exercise-- walks dog on occassion   Social Determinants of Health   Financial Resource Strain:   . Difficulty of Paying Living Expenses:   Food Insecurity:   . Worried About Charity fundraiser in the Last Year:   . Arboriculturist in the Last Year:   Transportation Needs:   . Film/video editor (Medical):   Marland Kitchen Lack of Transportation (Non-Medical):   Physical Activity:   . Days of Exercise per Week:   . Minutes of Exercise per Session:   Stress:   . Feeling of Stress :   Social Connections:   . Frequency of Communication with Friends and Family:   . Frequency of Social Gatherings with Friends and Family:   . Attends Religious Services:   . Active Member of Clubs or Organizations:   . Attends Archivist Meetings:   Marland Kitchen Marital Status:   Intimate Partner Violence:   . Fear of Current or Ex-Partner:   . Emotionally Abused:   Marland Kitchen Physically Abused:   . Sexually Abused:     PHYSICAL EXAM: Blood pressure (!) 84/58, pulse (!) 101, height 5\' 1"  (1.549 m), weight 112 lb (50.8 kg), SpO2 97 %. General: No acute distress.  Patient appears well-groomed.  Head:  Normocephalic/atraumatic Eyes:  fundi examined but not visualized Neck: supple, no paraspinal tenderness, full range of motion Back: No paraspinal tenderness Heart: regular rate and rhythm Lungs: Clear to auscultation bilaterally. Vascular: No carotid bruits. Neurological Exam: Mental status: alert and oriented to person, place, and time,  recent and remote memory intact, fund of knowledge intact, attention and concentration intact, speech fluent and not dysarthric, language intact. Cranial nerves: CN I:  not tested CN II: pupils equal, round and reactive to light, visual fields intact CN III, IV, VI:  full range of motion, no nystagmus, no ptosis CN V: facial sensation intact CN VII: upper and lower face symmetric CN VIII: hearing intact CN IX, X: gag intact, uvula midline CN XI: sternocleidomastoid and trapezius muscles intact CN XII: tongue midline Bulk & Tone: normal, no fasciculations. Motor:  5/5 throughout  Sensation:  Pinprick and vibration sensation intact.   Deep Tendon Reflexes:  2+ throughout, toes downgoing.   Finger to nose testing:  Without dysmetria.   Heel to shin:  Without dysmetria.   Gait:  Normal station and stride.  Able to turn and tandem walk. Romberg negative.  IMPRESSION: 1.  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  Cannot rule out malignancy.  Questionable partial seizures.  PLAN: 1.  Follow up pending serum and CSF labs 2.  Will check paraneoplastic panel  3.  Would repeat MRI of brain with and without contrast in one month along with MRI of cervical and thoracic spine with and without contrast 3.  Consider repeat lumbar puncture repeating cytology and flow cytometry as well as adding ACE and paraneoplastic panel 4.  For seizure prophylaxis, start Keppra 500mg  twice daily 5.  For nausea, Zofran 4mg  6.  Follow up in one month (after repeat imaging)  Thank you for allowing me to take part in the care of this patient.  Metta Clines, DO  CC:  Penni Homans, MD

## 2019-10-01 NOTE — Discharge Summary (Signed)
Physician Discharge Summary  Danielle Fox:[id number] DOB: [birth year] DOA: 09/27/2019  PCP: Mosie Lukes, MD  Admit date: 09/27/2019 Discharge date: 10/01/2019  Admitted From: Home Disposition: Home  Recommendations for Outpatient Follow-up:  1. Follow up with PCP in 1-2 weeks, neurology tomorrow on 10/02/2019 as scheduled 2. Please follow-up with neurology or PCP remainder of pending lab work  Home Health: Outpatient PT Equipment/Devices: None  Discharge Condition: Stable CODE STATUS: Full Diet recommendation: As tolerated  Brief/Interim Summary: Danielle Fox a 69 y.o.femalewith medical history significant ofrecent stroke on 7/17, prediabetes, mild intermittent COPD, HLD, presented with worsening of stroke symptoms. Patient was on the trip to [place] when she developed left arm weakness,she described as"my arm will not do what I wanted to do and coordination is off" and she went toa local ER 1 week later,MRI showed subtle enhancementonthe right posterior frontal lobe and anterior right parietal lobe which favored subacute cortical infarct. 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 During that hospital stay, her left-sided weakness resolved and she was discharged. While traveling back to [place], patient developed numbness of left upper lip and spasm of the left arm. She went to her PCP 9 days ago, whoorderedanotherMRI, which was done 3 days ago.MRI done 3 days ago showed cortical hyperintensity right anterior hemisphere whichcorrelated [place]. [place], patient woke up with left facial droop and worsening of left arm weakness, she could not raise her arm above left shoulder. Which is significant deterioration compared to her initial stroke presentation 12 days ago. Patient also reported she developed tonic headache under right  headovernight. Patient denied any chest pain, no blurry vision.CTA showed no significant intracranial stenosis, CTA of the showed 70% narrowing of the origin of the left subclavian artery.  Patient admitted as above with acute recurrent paresthesias and weakness with recent history of CVA on 09/15/2019.  Patient continues to have transient episodes of weakness and paresthesias despite no clear etiology.  Neurology and infectious disease following along here, patient has had LP with elevated white count concerning for infection but other labs and work-up remain unremarkable.  She does have cytology ongoing as well as other inflammatory markers as outlined below pending.  These include VDRL, IgG, oligoclonal bands,  AFB, culture these appear to be send out which may take some time to result.  A Lyme titer and cryptococcal work-up were noted to be negative, patient did have minimally elevated ESR at admission and MRI did show an abnormal FLAIR signal in the sulci over the superior right frontal convexity without evidence of developing encephalomalacia, no evidence of blood products on CT or MRI, and no convincing gyral/parenchymal signal or diffusion abnormality.  Given negative work-up thus far patient has essentially returned back to baseline and is requesting discharge and certainly reasonable to have remainder of lab work and ongoing evaluation in the outpatient setting with PT, neurology, PCP, infectious disease.   Discharge Diagnoses:  Active Problems:   CVA (cerebral vascular accident) [facility])    Discharge Instructions  Discharge Instructions    Ambulatory referral to Occupational Therapy   Complete by: As directed    Call MD for:  difficulty breathing, headache or visual disturbances   Complete by: As directed    Call MD for:  extreme fatigue   Complete by: As directed    Call MD for:  persistant dizziness or light-headedness   Complete by: As directed  Call MD for:  persistant nausea  and vomiting   Complete by: As directed    Diet - low sodium heart healthy   Complete by: As directed    Increase activity slowly   Complete by: As directed      Allergies as of 10/01/2019      Reactions   Doxycycline Nausea And Vomiting   Erythromycin Base    Other reaction(s): GI Upset (intolerance)      Medication List    STOP taking these medications   albuterol 108 (90 Base) MCG/ACT inhaler Commonly known as: VENTOLIN HFA   cetirizine 10 MG tablet Commonly known as: ZYRTEC     TAKE these medications   aspirin 81 MG EC tablet Take 1 tablet (81 mg total) by mouth in the morning and at bedtime.   atorvastatin 40 MG tablet Commonly known as: LIPITOR Take 1 tablet by mouth at bedtime.   budesonide-formoterol 80-4.5 MCG/ACT inhaler Commonly known as: Symbicort Inhale 2 puffs into the lungs in the morning and at bedtime. What changed:   when to take this  reasons to take this   clopidogrel 75 MG tablet Commonly known as: PLAVIX Take 1 tablet (75 mg total) by mouth daily. Start taking on: October 02, 2019   esomeprazole 40 MG capsule Commonly known as: NEXIUM Take 1 capsule (40 mg total) by mouth daily before breakfast.   gabapentin 100 MG capsule Commonly known as: Neurontin Take 3 capsules (300 mg total) by mouth 3 (three) times daily.       Follow-up Information    [facility] Follow up.   Why: The outpatient therapy will contact you for the first appointment Contact information: [address]  [place], [place] [address]  [phone]             Allergies  Allergen Reactions  . Doxycycline Nausea And Vomiting  . Erythromycin Base     Other reaction(s): GI Upset (intolerance)    Consultations:  Neurology, infectious disease   Procedures/Studies: EEG  Result Date: 09/28/2019 Lora Havens, MD     09/28/2019  1:09 PM Patient Name: Danielle Fox MRN: [id number] Epilepsy Attending: Lora Havens Referring  Physician/Provider: Dr Kerney Elbe Date: 09/28/2019 Duration: 23.36 mins Patient history: 69 y.o.femalewitha recentpresentation at an OSH in [place] for what was diagnosed as a stroke (08/2019) in the context of atypical imaging appearance on MRI, who re-presented [place] ED on Thursday as a code stroke for left sidedweakness and facial droop,worsened relative to her recent newbaseline. EEG to evaluate for seizure. Level of alertness: Awake, drowsy AEDs during EEG study: None Technical aspects: This EEG study was done with scalp electrodes positioned according to the 10-20 International system of electrode placement. Electrical activity was acquired at a sampling rate of _0  and reviewed with a high frequency filter of _1  and a low frequency filter of _2 . EEG data were recorded continuously and digitally stored. Description: The posterior dominant rhythm consists of 9 Hz activity of moderate voltage (25-35 uV) seen predominantly in posterior head regions, symmetric and reactive to eye opening and eye closing. Drowsiness was characterized by attenuation of the posterior background rhythm. EEG showed intermittent 3 to 6 Hz theta-delta slowing in right hemisphere.  Hyperventilation and photic stimulation were not performed.   ABNORMALITY -Intermittent slow, right hemisphere IMPRESSION: This study is suggestive of cortical dysfunction in right hemisphere nonspecific etiology but likely related to underlying cortical abnormality. No seizures or epileptiform discharges were seen  throughout the recording. Lora Havens   CT Code Stroke CTA Head W/WO contrast  Result Date: 09/27/2019 CLINICAL DATA:  Left-sided weakness EXAM: CT ANGIOGRAPHY HEAD AND NECK TECHNIQUE: Multidetector CT imaging of the head and neck was performed using the standard protocol during bolus administration of intravenous contrast. Multiplanar CT image reconstructions and MIPs were obtained to evaluate the vascular anatomy.  Carotid stenosis measurements (when applicable) are obtained utilizing NASCET criteria, using the distal internal carotid diameter as the denominator. CONTRAST:  55m OMNIPAQUE IOHEXOL 350 MG/ML SOLN COMPARISON:  None. FINDINGS: CTA NECK Aortic arch: Mild plaque along the aortic arch. Great vessel origins are patent. There is eccentric primarily noncalcified plaque at the left subclavian origin causing approximately 60% stenosis. Ulcerated plaque is also noted. Right carotid system: Patent. Minimal calcified plaque at the ICA origin without measurable stenosis. Left carotid system: Patent. Mild calcified plaque at the ICA origin without measurable stenosis. Vertebral arteries: Patent.  Codominant.  No measurable stenosis. Skeleton: Degenerative changes of the cervical spine. Other neck: No mass or adenopathy. Upper chest: No apical lung mass. Review of the MIP images confirms the above findings CTA HEAD Anterior circulation: Intracranial internal carotid arteries are patent. Anterior and middle cerebral arteries are patent. Posterior circulation: Intracranial vertebral arteries and PICA origins are patent. Basilar artery is patent. Superior cerebellar artery origins are patent. Patent posterior cerebral arteries. Venous sinuses: Patent as allowed by contrast bolus timing. Review of the MIP images confirms the above findings IMPRESSION: No large vessel occlusion. 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 Plaque ulceration is also noted. Electronically Signed   By: PMacy MisM.D.   On: 09/27/2019 14:52   CT Head Wo Contrast  Result Date: 09/17/2019 CLINICAL DATA:  Reported numbness EXAM: CT HEAD WITHOUT CONTRAST TECHNIQUE: Contiguous axial images were obtained from the base of the skull through the vertex without intravenous contrast. COMPARISON:  Sinus CT 08/02/2014 FINDINGS: Brain: No evidence of acute infarction, hemorrhage, hydrocephalus, extra-axial  collection or mass lesion/mass effect. Symmetric prominence of the ventricles, cisterns and sulci compatible with parenchymal volume loss. Patchy areas of white matter hypoattenuation are most compatible with chronic microvascular angiopathy. Vascular: Atherosclerotic calcification of the carotid siphons. No hyperdense vessel. Skull: No calvarial fracture or suspicious osseous lesion. No scalp swelling or hematoma. Sinuses/Orbits: Minimal thickening the left frontal ethmoid and right maxillary sinuses. No air-fluid levels. Mastoid air cells are well aerated. Included orbital structures are unremarkable. Other: None IMPRESSION: 1. No acute intracranial abnormality. 2. Chronic microvascular angiopathy and parenchymal volume loss. Intracranial atherosclerosis. Electronically Signed   By: PLovena LeM.D.   On: 09/17/2019 19:23   CT Code Stroke CTA Neck W/WO contrast  Result Date: 09/27/2019 CLINICAL DATA:  Left-sided weakness EXAM: CT ANGIOGRAPHY HEAD AND NECK TECHNIQUE: Multidetector CT imaging of the head and neck was performed using the standard protocol during bolus administration of intravenous contrast. Multiplanar CT image reconstructions and MIPs were obtained to evaluate the vascular anatomy. Carotid stenosis measurements (when applicable) are obtained utilizing NASCET criteria, using the distal internal carotid diameter as the denominator. CONTRAST:  537mOMNIPAQUE IOHEXOL 350 MG/ML SOLN COMPARISON:  None. FINDINGS: CTA NECK Aortic arch: Mild plaque along the aortic arch. Great vessel origins are patent. There is eccentric primarily noncalcified plaque at the left subclavian origin causing approximately 60% stenosis. Ulcerated plaque is also noted. Right carotid system: Patent. Minimal calcified plaque at the ICA origin without measurable stenosis. Left carotid system: Patent. Mild calcified  plaque at the ICA origin without measurable stenosis. Vertebral arteries: Patent.  Codominant.  No measurable  stenosis. Skeleton: Degenerative changes of the cervical spine. Other neck: No mass or adenopathy. Upper chest: No apical lung mass. Review of the MIP images confirms the above findings CTA HEAD Anterior circulation: Intracranial internal carotid arteries are patent. Anterior and middle cerebral arteries are patent. Posterior circulation: Intracranial vertebral arteries and PICA origins are patent. Basilar artery is patent. Superior cerebellar artery origins are patent. Patent posterior cerebral arteries. Venous sinuses: Patent as allowed by contrast bolus timing. Review of the MIP images confirms the above findings IMPRESSION: No large vessel occlusion. 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 Plaque ulceration is also noted. Electronically Signed   By: Macy Mis M.D.   On: 09/27/2019 14:52   MR BRAIN WO CONTRAST  Result Date: 09/27/2019 CLINICAL DATA:  69 year old female left side weakness code stroke presentation today. EXAM: MRI HEAD WITHOUT CONTRAST TECHNIQUE: Multiplanar, multiecho pulse sequences of the brain and surrounding structures were obtained without intravenous contrast. COMPARISON:  CTA head and neck, CT head earlier today. Brain MRI [facility] 09/22/2019. Report of an outside brain MRI without and with contrast 09/15/2019. FINDINGS: Brain: No parenchymal restricted or facilitated diffusion is identified. However, there may be subtle a abnormal diffusion along the surface of the right superior frontal lobe (series 7, image 50). And there is persistent abnormal appearance of the superior right frontal lobe and also contralateral left cingulate sulci on FLAIR imaging with hyperintensity (series 11, image 20). But no gyral edema or encephalomalacia is evident. SWI imaging is unremarkable, and noncontrast CT today demonstrated only subtle asymmetric effacement of the sulci. Questionable similar but less pronounced FLAIR  abnormality along the left inferior parietal/occipital junction on series 11, image 14 today, and stable from 09/22/2019. No similar FLAIR abnormality elsewhere. No intraventricular debris. Basilar cisterns appear normal. No midline shift, mass effect, evidence of mass lesion, ventriculomegaly, extra-axial collection or acute intracranial hemorrhage. Cervicomedullary junction and pituitary are within normal limits. Vascular: Major intracranial vascular flow voids are stable. Skull and upper cervical spine: Negative visible cervical spine. Visualized bone marrow signal is within normal limits. Sinuses/Orbits: Stable, negative. Other: Mastoids remain clear.  Scalp and face appear negative. IMPRESSION: 1. Persistent abnormal FLAIR signal in the sulci over the superior right frontal convexity, first reported on 09/15/2019 and reportedly with abnormal enhancement at that time. But no evidence of developing encephalomalacia, no evidence of blood products on CT or MRI, and no convincing gyral/parenchymal signal or diffusion abnormality (although questionable abnormal leptomeningeal appearance there on DWI). Similar abnormal sulci in the left cingulate and at the left parieto-occipital junction. But other CSF spaces appear normal. 2. The etiology is unclear and CSF analysis is recommended. 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 Electronically Signed   By: Genevie Ann M.D.   On: 09/27/2019 18:49   MR Brain W Wo Contrast  Result Date: 09/24/2019 CLINICAL DATA:  Left hand numbness. EXAM: MRI HEAD WITHOUT AND WITH CONTRAST TECHNIQUE: Multiplanar, multiecho pulse sequences of the brain and surrounding structures were obtained without and with intravenous contrast. CONTRAST:  53m GADAVIST GADOBUTROL 1 MMOL/ML IV SOLN COMPARISON:  None. FINDINGS: Brain: No acute infarct, acute hemorrhage or extra-axial collection. There is cortical/sulcal  hyperintensity within the anterior right hemisphere on the axial FLAIR sequence. White matter signal is normal. Normal volume  of CSF spaces. No chronic microhemorrhage. Normal midline structures. There is no abnormal contrast enhancement. Vascular: Normal flow voids. Skull and upper cervical spine: Normal marrow signal. Sinuses/Orbits: Negative. Other: None. IMPRESSION: 1. No acute intracranial abnormality. 2. Cortical/sulcal hyperintensity within the anterior right hemisphere on the axial FLAIR sequence. According to the report from MRI performed 09/15/2019, this finding was also present at that time. This may be a late subacute sequela of an ischemic event. 3. No abnormal contrast enhancement is visible on the current study, though it was reportedly present on the prior examination. Electronically Signed   By: Ulyses Jarred M.D.   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 CT is requested to evaluate for a possible primary malignancy. Surgical history includes appendectomy. EXAM: CT ABDOMEN AND PELVIS WITH CONTRAST TECHNIQUE: Multidetector CT imaging of the abdomen and pelvis was performed using the standard protocol following bolus administration of intravenous contrast. CONTRAST:  113m OMNIPAQUE IOHEXOL 300 MG/ML IV. COMPARISON:  None. FINDINGS: Lower chest: Heart size normal.  Visualized lung bases clear. Hepatobiliary: Liver normal in size and appearance. Gallbladder normal in appearance without calcified gallstones. No biliary ductal dilation. Pancreas: Normal in appearance without evidence of mass, ductal dilation, or inflammation. Spleen: Normal in size and appearance. Adrenals/Urinary Tract: Normal appearing adrenal glands. Kidneys normal in size and appearance without focal parenchymal abnormality. No hydronephrosis. No evidence of  urinary tract calculi. Normal appearing decompressed urinary bladder. Stomach/Bowel: Stomach normal in appearance for the degree of distention. Normal-appearing small bowel. Moderate stool burden throughout the normal appearing colon. Surgically absent appendix. Vascular/Lymphatic: Moderate to severe atherosclerosis involving the abdominal aorta with calcified and noncalcified plaque. No evidence of abdominal aortic aneurysm. Moderate iliofemoral atherosclerosis bilaterally. Normal-appearing portal venous and systemic venous systems. No pathologic lymphadenopathy. Reproductive: Large cyst arising from the LEFT adnexum, with focal mild thickening and enhancement of its wall superiorly and inferiorly, associated with a thin septum inferiorly. The mass measures approximately 9.8 x 7.5 x 8.7 cm. Uterus not visualized and presumed surgically absent. Other: None. Musculoskeletal: Facet degenerative changes involving the lower lumbar spine. Degenerative grade 1 spondylolisthesis of L4 on L5 measuring approximately 6 mm. No acute findings. IMPRESSION: 1. Approximate 10 cm complicated cyst arising from the LEFT adnexum, with focal mild thickening and enhancement of its wall superiorly and inferiorly, associated with a thin septum inferiorly. Transabdominal and transvaginal pelvic ultrasound is recommended in further evaluation to better characterize this mass. This recommendation follows ACR consensus guidelines: White Paper of the ACR Incidental Findings Committee II on Adnexal Findings. J Am Coll Radiol 2013:10:675-681. 2. No acute abnormalities otherwise involving the abdomen or pelvis. 3. Degenerative grade 1 spondylolisthesis of L4 on L5 measuring approximately 6 mm. Aortic Atherosclerosis (ICD10-I70.0). Electronically Signed   By: TEvangeline DakinM.D.   On: 09/30/2019 14:20   CT Chest High Resolution  Result Date: 10/01/2019 CLINICAL DATA:  Concern for sarcoidosis, admitted for stroke, abnormal MRI EXAM: CT CHEST  WITHOUT CONTRAST TECHNIQUE: Multidetector CT imaging of the chest was performed following the standard protocol without intravenous contrast. High resolution imaging of the lungs, as well as inspiratory and expiratory imaging, was performed. COMPARISON:  CT chest, 01/22/2019 FINDINGS: Cardiovascular: Aortic atherosclerosis. Normal heart size. No pericardial effusion. Mediastinum/Nodes: Numerous prominent, although not pathologically enlarged mediastinal and hilar lymph nodes, unchanged compared to prior examination. No calcified lymph nodes. Thyroid  gland, trachea, and esophagus demonstrate no significant findings. Lungs/Pleura: Minimal nonspecific biapical pleuroparenchymal scarring and centrilobular nodularity, not significantly changed compared to prior examination. No evidence of fibrotic interstitial lung disease. No significant air trapping on expiratory phase imaging. No pleural effusion or pneumothorax. Upper Abdomen: No acute abnormality. Musculoskeletal: No chest wall mass or suspicious bone lesions identified. IMPRESSION: 1. Minimal nonspecific biapical pleuroparenchymal scarring and centrilobular nodularity, not significantly changed compared to prior examination. No evidence of fibrotic interstitial lung disease. No specific features to suggest pulmonary sarcoidosis. Centrilobular nodularity noted is most commonly seen in smoking-related respiratory bronchiolitis. 2. Numerous prominent, although not pathologically enlarged mediastinal and hilar lymph nodes, unchanged compared to prior examination. No calcified lymph nodes. No specific features to suggest nodal sarcoidosis. 3. Aortic Atherosclerosis (ICD10-I70.0). Electronically Signed   By: Eddie Candle M.D.   On: 10/01/2019 08:22   ECHOCARDIOGRAM COMPLETE  Result Date: 09/28/2019    ECHOCARDIOGRAM REPORT   Patient Name:   KAZZANDRA DESAULNIERS Date of Exam: 09/27/2019 Medical Rec #:  [id number]         Height:       61.0 in Accession #:    [id number]         Weight:       117.4 lb Date of Birth:  08/17/1950          BSA:          1.506 m Patient Age:    10 years          BP:           121/56 mmHg Patient Gender: F                 HR:           91 bpm. Exam Location:  Inpatient Procedure: 2D Echo Indications:    TIA  History:        Patient has prior history of Echocardiogram examinations, most                 recent 11/11/2017. COPD; Risk Factors:Former Smoker.  Sonographer:    Jannett Celestine RDCS (AE) Referring Phys: [id number] Lequita Halt  Sonographer Comments: Suboptimal parasternal window. off axis apical windows IMPRESSIONS  1. Left ventricular ejection fraction, by estimation, is 60 to 65%. The left ventricle has normal function. The left ventricle has no regional wall motion abnormalities. Left ventricular diastolic parameters were normal.  2. Right ventricular systolic function is normal. The right ventricular size is normal. Tricuspid regurgitation signal is inadequate for assessing PA pressure.  3. The mitral valve is normal in structure. No evidence of mitral valve regurgitation.  4. The aortic valve was not well visualized. Aortic valve regurgitation is not visualized. No aortic stenosis is present. FINDINGS  Left Ventricle: Left ventricular ejection fraction, by estimation, is 60 to 65%. The left ventricle has normal function. The left ventricle has no regional wall motion abnormalities. The left ventricular internal cavity size was normal in size. There is  no left ventricular hypertrophy. Left ventricular diastolic parameters were normal. Right Ventricle: The right ventricular size is normal. No increase in right ventricular wall thickness. Right ventricular systolic function is normal. Tricuspid regurgitation signal is inadequate for assessing PA pressure. Left Atrium: Left atrial size was normal in size. Right Atrium: Right atrial size was not well visualized. Pericardium: There is no evidence of pericardial effusion. Mitral Valve: The mitral valve is  normal in structure. No evidence of mitral valve regurgitation. Tricuspid Valve: The  tricuspid valve is grossly normal. Tricuspid valve regurgitation is trivial. Aortic Valve: The aortic valve was not well visualized. Aortic valve regurgitation is not visualized. No aortic stenosis is present. Pulmonic Valve: The pulmonic valve was not well visualized. Pulmonic valve regurgitation is not visualized. Aorta: The aortic root was not well visualized. IAS/Shunts: The interatrial septum was not well visualized.  LEFT VENTRICLE PLAX 2D LVIDd:         3.50 cm Diastology LVIDs:         2.30 cm LV e' lateral:   9.68 cm/s LV PW:         1.00 cm LV E/e' lateral: 6.1 LV IVS:        0.80 cm  LEFT ATRIUM           Index LA diam:      2.60 cm 1.73 cm/m LA Vol (A2C): 19.5 ml 12.95 ml/m  AORTIC VALVE LVOT Vmax:   62.90 cm/s LVOT Vmean:  44.100 cm/s LVOT VTI:    0.126 m MITRAL VALVE MV Area (PHT): 3.72 cm    SHUNTS MV Decel Time: 204 msec    Systemic VTI: 0.13 m MV E velocity: 58.90 cm/s MV A velocity: 55.50 cm/s MV E/A ratio:  1.06 Epifanio Lesches MD Electronically signed by Epifanio Lesches MD Signature Date/Time: 09/28/2019/12:08:03 AM    Final    CT HEAD CODE STROKE WO CONTRAST  Result Date: 09/27/2019 CLINICAL DATA:  Code stroke.  Left-sided weakness EXAM: CT HEAD WITHOUT CONTRAST TECHNIQUE: Contiguous axial images were obtained from the base of the skull through the vertex without intravenous contrast. COMPARISON:  CT head 09/17/2019 FINDINGS: Brain: No acute intracranial hemorrhage, mass effect, or edema. Gray-white differentiation remains preserved. Ventricles are stable in size. No extra-axial fluid collection. Vascular: No hyperdense vessel. Skull: Unremarkable. Sinuses/Orbits: No acute abnormality. Other: Mastoid air cells are clear. ASPECTS ([place] Stroke Program Early CT Score) - Ganglionic level infarction (caudate, lentiform nuclei, internal capsule, insula, M1-M3 cortex): 7 - Supraganglionic infarction  (M4-M6 cortex): 3 Total score (0-10 with 10 being normal): 10 IMPRESSION: No acute intracranial hemorrhage or evidence acute infarction. ASPECT score is 10. These results were communicated to Dr. Otelia Limes at 2:32 pmon 7/29/2021by text page via the [facility] messaging system. Electronically Signed   By: Guadlupe Spanish M.D.   On: 09/27/2019 14:36   DG FLUORO GUIDE LUMBAR PUNCTURE  Result Date: 09/28/2019 CLINICAL DATA:  Left-sided weakness, abnormal MRI EXAM: DIAGNOSTIC LUMBAR PUNCTURE UNDER FLUOROSCOPIC GUIDANCE FLUOROSCOPY TIME:  Fluoroscopy Time:  18 seconds Radiation Exposure Index (if provided by the fluoroscopic device): 1.4 mGy PROCEDURE: Informed consent was obtained from the patient prior to the procedure, including potential complications of headache, allergy, and pain. With the patient prone, the lower back was prepped with Betadine. 1% Lidocaine was used for local anesthesia. Lumbar puncture was performed at the L3-L4 level using a 22 gauge needle with return of clear CSF. 13 ml of CSF were obtained for laboratory studies. The patient tolerated the procedure well and there were no apparent complications. IMPRESSION: Technically successful fluoroscopic guided lumbar puncture. Electronically Signed   By: Guadlupe Spanish M.D.   On: 09/28/2019 17:24     Subjective: No acute issues or events overnight, patient requesting discharge home which is certainly reasonable given resolution of symptoms now back to baseline.   Discharge Exam: Vitals:   10/01/19 0441 10/01/19 1217  BP: 116/74 106/67  Pulse:  86  Resp:  16  Temp: 99.6 F (37.6 C) 97.9 F (36.6  C)  SpO2: 98% 99%   Vitals:   10/01/19 0031 10/01/19 0412 10/01/19 0441 10/01/19 1217  BP: 105/66 113/72 116/74 106/67  Pulse: 93 99  86  Resp: _0 Temp: 99.7 F (37.6 C) (!) 101.6 F (38.7 C) 99.6 F (37.6 C) 97.9 F (36.6 C)  TempSrc: Oral Oral  Oral  SpO2: 97% 97% 98% 99%  Weight:      Height:        General: Pt is alert,  awake, not in acute distress Cardiovascular: RRR, S1/S2 +, no rubs, no gallops Respiratory: CTA bilaterally, no wheezing, no rhonchi Abdominal: Soft, NT, ND, bowel sounds + Extremities: no edema, no cyanosis    The results of significant diagnostics from this hospitalization (including imaging, microbiology, ancillary and laboratory) are listed below for reference.     Microbiology: Recent Results (from the past 240 hour(s))  SARS Coronavirus 2 by RT PCR (hospital order, performed in [facility] hospital lab) Nasopharyngeal Nasopharyngeal Swab     Status: None   Collection Time: 09/27/19  8:11 PM   Specimen: Nasopharyngeal Swab  Result Value Ref Range Status   SARS Coronavirus 2 NEGATIVE NEGATIVE Final    Comment: (NOTE) SARS-CoV-2 target nucleic acids are NOT DETECTED.  The SARS-CoV-2 RNA is generally detectable in upper and lower respiratory specimens during the acute phase of infection. The lowest concentration of SARS-CoV-2 viral copies this assay can detect is 250 copies / mL. A negative result does not preclude SARS-CoV-2 infection and should not be used as the sole basis for treatment or other patient management decisions.  A negative result may occur with improper specimen collection / handling, submission of specimen other than nasopharyngeal swab, presence of viral mutation(s) within the areas targeted by this assay, and inadequate number of viral copies (<250 copies / mL). A negative result must be combined with clinical observations, patient history, and epidemiological information.  Fact Sheet for Patients:   StrictlyIdeas.no  Fact Sheet for Healthcare Providers: BankingDealers.co.za  This test is not yet approved or  cleared by the [place] FDA and has been authorized for detection and/or diagnosis of SARS-CoV-2 by FDA under an Emergency Use Authorization (EUA).  This EUA will remain in effect (meaning this  test can be used) for the duration of the COVID-19 declaration under Section 564(b)(1) of the Act, 21 U.S.C. section 360bbb-3(b)(1), unless the authorization is terminated or revoked sooner.  Performed at [facility], [place] [address], [place], [place] [address]   CSF culture     Status: None   Collection Time: 09/28/19  5:06 PM   Specimen: PATH Cytology CSF; Cerebrospinal Fluid  Result Value Ref Range Status   Specimen Description CSF  Final   Special Requests NONE  Final   Gram Stain   Final    WBC PRESENT, PREDOMINANTLY PMN NO ORGANISMS SEEN CYTOSPIN SMEAR    Culture   Final    NO GROWTH 3 DAYS Performed at [facility], [place] [address], [place], [place] [address]    Report Status 10/01/2019 FINAL  Final  Acid Fast Smear (AFB)     Status: None   Collection Time: 09/28/19  5:06 PM   Specimen: PATH Cytology CSF; Cerebrospinal Fluid  Result Value Ref Range Status   AFB Specimen Processing Concentration  Final   Acid Fast Smear Negative  Final    Comment: (NOTE) Performed At: [facility] [address] [place], [place] [id number] Rush Farmer MD VO:[id number]  Source (AFB) CSF  Final    Comment: Performed at [facility], [place] [address], [place], [place] [address]     Labs: BNP (last 3 results) No results for input(s): BNP in the last 8760 hours. Basic Metabolic Panel: Recent Labs  Lab 09/27/19 1423 09/27/19 1424  NA 135 135  K 4.2 4.1  CL 100 99  CO2 23  --   GLUCOSE 90 89  BUN 6* 6*  CREATININE 0.83 0.70  CALCIUM 9.4  --    Liver Function Tests: Recent Labs  Lab 09/27/19 1423 09/28/19 1706  AST 16  --   ALT 15  --   ALKPHOS 79  --   BILITOT 0.6  --   PROT 7.1  --   ALBUMIN 3.3* 3.1*   No results for input(s): LIPASE, AMYLASE in the last 168 hours. No results for input(s): AMMONIA in the last 168 hours. CBC: Recent Labs  Lab 09/27/19 1423 09/27/19 1424  WBC 11.6*  --   NEUTROABS 9.4*  --   HGB 12.3 13.3  HCT  39.5 39.0  MCV 83.5  --   PLT 412*  --    Cardiac Enzymes: No results for input(s): CKTOTAL, CKMB, CKMBINDEX, TROPONINI in the last 168 hours. BNP: Invalid input(s): POCBNP CBG: No results for input(s): GLUCAP in the last 168 hours. D-Dimer No results for input(s): DDIMER in the last 72 hours. Hgb A1c No results for input(s): HGBA1C in the last 72 hours. Lipid Profile No results for input(s): CHOL, HDL, LDLCALC, TRIG, CHOLHDL, LDLDIRECT in the last 72 hours. Thyroid function studies No results for input(s): TSH, T4TOTAL, T3FREE, THYROIDAB in the last 72 hours.  Invalid input(s): FREET3 Anemia work up No results for input(s): VITAMINB12, FOLATE, FERRITIN, TIBC, IRON, RETICCTPCT in the last 72 hours. Urinalysis    Component Value Date/Time   COLORURINE YELLOW 09/27/2019 1800   APPEARANCEUR CLEAR 09/27/2019 1800   LABSPEC >1.046 (H) 09/27/2019 1800   PHURINE 6.0 09/27/2019 1800   GLUCOSEU NEGATIVE 09/27/2019 1800   GLUCOSEU NEGATIVE 09/18/2019 1626   HGBUR NEGATIVE 09/27/2019 1800   BILIRUBINUR NEGATIVE 09/27/2019 1800   BILIRUBINUR negative 05/15/2019 1105   KETONESUR 20 (A) 09/27/2019 1800   PROTEINUR NEGATIVE 09/27/2019 1800   UROBILINOGEN 0.2 09/18/2019 1626   NITRITE NEGATIVE 09/27/2019 1800   LEUKOCYTESUR TRACE (A) 09/27/2019 1800   Sepsis Labs Invalid input(s): PROCALCITONIN,  WBC,  LACTICIDVEN Microbiology Recent Results (from the past 240 hour(s))  SARS Coronavirus 2 by RT PCR (hospital order, performed in [place] hospital lab) Nasopharyngeal Nasopharyngeal Swab     Status: None   Collection Time: 09/27/19  8:11 PM   Specimen: Nasopharyngeal Swab  Result Value Ref Range Status   SARS Coronavirus 2 NEGATIVE NEGATIVE Final    Comment: (NOTE) SARS-CoV-2 target nucleic acids are NOT DETECTED.  The SARS-CoV-2 RNA is generally detectable in upper and lower respiratory specimens during the acute phase of infection. The lowest concentration of SARS-CoV-2 viral  copies this assay can detect is 250 copies / mL. A negative result does not preclude SARS-CoV-2 infection and should not be used as the sole basis for treatment or other patient management decisions.  A negative result may occur with improper specimen collection / handling, submission of specimen other than nasopharyngeal swab, presence of viral mutation(s) within the areas targeted by this assay, and inadequate number of viral copies (<250 copies / mL). A negative result must be combined with clinical observations, patient history, and epidemiological information.  Fact Sheet for Patients:   StrictlyIdeas.no  Fact Sheet for Healthcare Providers: BankingDealers.co.za  This test is not yet approved or  cleared by the [place] FDA and has been authorized for detection and/or diagnosis of SARS-CoV-2 by FDA under an Emergency Use Authorization (EUA).  This EUA will remain in effect (meaning this test can be used) for the duration of the COVID-19 declaration under Section 564(b)(1) of the Act, 21 U.S.C. section 360bbb-3(b)(1), unless the authorization is terminated or revoked sooner.  Performed at [facility], [place] [address], [place], [place] [address]   CSF culture     Status: None   Collection Time: 09/28/19  5:06 PM   Specimen: PATH Cytology CSF; Cerebrospinal Fluid  Result Value Ref Range Status   Specimen Description CSF  Final   Special Requests NONE  Final   Gram Stain   Final    WBC PRESENT, PREDOMINANTLY PMN NO ORGANISMS SEEN CYTOSPIN SMEAR    Culture   Final    NO GROWTH 3 DAYS Performed at [facility], [place] [address] [place], [place] [address]    Report Status 10/01/2019 FINAL  Final  Acid Fast Smear (AFB)     Status: None   Collection Time: 09/28/19  5:06 PM   Specimen: PATH Cytology CSF; Cerebrospinal Fluid  Result Value Ref Range Status   AFB Specimen Processing Concentration  Final   Acid Fast  Smear Negative  Final    Comment: (NOTE) Performed At: [facility] [place], [place] [id number] Rush Farmer MD OV:[id number]    Source (AFB) CSF  Final    Comment: Performed at [facility], [place] [address], [place], [place] [address]     Time coordinating discharge: Over 30 minutes  SIGNED:   Little Ishikawa, DO [facility] 10/01/2019, 2:40 PM Pager   If 7PM-7AM, please contact night-coverage [email or web address]

## 2019-10-02 ENCOUNTER — Other Ambulatory Visit: Payer: Medicare Other

## 2019-10-02 ENCOUNTER — Encounter: Payer: Self-pay | Admitting: Neurology

## 2019-10-02 ENCOUNTER — Telehealth: Payer: Self-pay | Admitting: *Deleted

## 2019-10-02 ENCOUNTER — Ambulatory Visit (INDEPENDENT_AMBULATORY_CARE_PROVIDER_SITE_OTHER): Payer: Medicare Other | Admitting: Neurology

## 2019-10-02 ENCOUNTER — Encounter: Payer: Self-pay | Admitting: *Deleted

## 2019-10-02 ENCOUNTER — Other Ambulatory Visit: Payer: Self-pay

## 2019-10-02 VITALS — BP 84/58 | HR 101 | Ht 61.0 in | Wt 112.0 lb

## 2019-10-02 DIAGNOSIS — M359 Systemic involvement of connective tissue, unspecified: Secondary | ICD-10-CM

## 2019-10-02 DIAGNOSIS — G053 Encephalitis and encephalomyelitis in diseases classified elsewhere: Secondary | ICD-10-CM

## 2019-10-02 LAB — SJOGRENS SYNDROME-A EXTRACTABLE NUCLEAR ANTIBODY: SSA (Ro) (ENA) Antibody, IgG: <0.2 AI (ref 0.0–0.9)

## 2019-10-02 LAB — OLIGOCLONAL BANDS, CSF + SERM

## 2019-10-02 LAB — ANCA TITERS
Atypical P-ANCA titer: <1:20 {titer}
C-ANCA: <1:20 {titer}
P-ANCA: <1:20 {titer}

## 2019-10-02 LAB — PATHOLOGIST SMEAR REVIEW: Path Review: INCREASED

## 2019-10-02 LAB — ROCKY MTN SPOTTED FVR ABS PNL(IGG+IGM)
RMSF IgG: NEGATIVE
RMSF IgM: 0.5 index (ref 0.00–0.89)

## 2019-10-02 LAB — ANGIOTENSIN CONVERTING ENZYME: Angiotensin-Converting Enzyme: 22 U/L (ref 14–82)

## 2019-10-02 LAB — RHEUMATOID FACTOR: Rheumatoid fact SerPl-aCnc: <10 IU/mL (ref 0.0–13.9)

## 2019-10-02 LAB — ANA W/REFLEX IF POSITIVE: Anti Nuclear Antibody (ANA): NEGATIVE

## 2019-10-02 LAB — SJOGRENS SYNDROME-B EXTRACTABLE NUCLEAR ANTIBODY: SSB (La) (ENA) Antibody, IgG: <0.2 AI (ref 0.0–0.9)

## 2019-10-02 MED ORDER — ONDANSETRON HCL 4 MG PO TABS: 4.0000 mg | ORAL_TABLET | Freq: Three times a day (TID) | ORAL | 5 refills | Status: DC | PRN

## 2019-10-02 MED ORDER — LEVETIRACETAM 500 MG PO TABS
500.0000 mg | ORAL_TABLET | Freq: Two times a day (BID) | ORAL | 5 refills | Status: DC
Start: 2019-10-02 — End: 2019-12-31

## 2019-10-02 NOTE — Telephone Encounter (Signed)
1st attempt. Unable to reach patient.   

## 2019-10-02 NOTE — Patient Instructions (Addendum)
1.  We will check paraneoplastic panel 2.  We will repeat MRI of brain with and without contrast in 4 weeks.  Will also check MRI of cervical and thoracic spine with and without contrast. 3.  Follow up after repeat MRI 4.  Start levetiracetam 500mg  twice daily for seizure prevention 5.  Take ondansetron 4mg  for nausea as needed/directed.

## 2019-10-03 LAB — ANTI-HU, ANTI-RI, ANTI-YO IFA
Anti-Hu Ab: NEGATIVE
Anti-Ri Ab: NEGATIVE
Anti-Yo Ab: NEGATIVE

## 2019-10-03 LAB — ROCKY MTN SPOTTED FVR ABS PNL(IGG+IGM)
RMSF IgG: NEGATIVE
RMSF IgM: 0.39 index (ref 0.00–0.89)

## 2019-10-03 LAB — QUANTIFERON-TB GOLD PLUS

## 2019-10-03 NOTE — Telephone Encounter (Signed)
I have made two attempts and have been unable to reach patient. I have mailed the patient a letter requesting they call the office to schedule a hospital follow up appointment.   

## 2019-10-04 ENCOUNTER — Telehealth: Payer: Self-pay | Admitting: Neurology

## 2019-10-04 LAB — EPSTEIN BARR VRS(EBV DNA BY PCR)
EBV DNA QN by PCR: NEGATIVE copies/mL
log10 EBV DNA Qn PCR: UNDETERMINED log10 copy/mL

## 2019-10-04 NOTE — Telephone Encounter (Signed)
Patient called in wanting to find out if her medication she started taking at her last visit can be altered? It's making her extremely tired.

## 2019-10-04 NOTE — Telephone Encounter (Signed)
Pt is unsure which one but it is making pt really tired. Pt taking a lot of meds and she is unsure it is the new ones with the ones or what.   Please advise

## 2019-10-04 NOTE — Telephone Encounter (Signed)
Pt advised of DR. Aquino note

## 2019-10-04 NOTE — Telephone Encounter (Signed)
If the symptoms are since she started the new medication from Dr. Georgie Chard visit, his note indicates Keppra 500mg  twice a day. She can try taking 1/2 tab in AM, 1 tab in PM and see if this helps. Thanks

## 2019-10-08 ENCOUNTER — Encounter (HOSPITAL_BASED_OUTPATIENT_CLINIC_OR_DEPARTMENT_OTHER): Payer: Self-pay | Admitting: Emergency Medicine

## 2019-10-08 ENCOUNTER — Telehealth: Payer: Self-pay | Admitting: Internal Medicine

## 2019-10-08 ENCOUNTER — Inpatient Hospital Stay (HOSPITAL_BASED_OUTPATIENT_CLINIC_OR_DEPARTMENT_OTHER)
Admission: EM | Admit: 2019-10-08 | Discharge: 2019-10-12 | DRG: 371 | Disposition: A | Discharge status: Home / Self Care | Payer: Medicare Other | Attending: Internal Medicine | Admitting: Internal Medicine

## 2019-10-08 ENCOUNTER — Emergency Department (HOSPITAL_BASED_OUTPATIENT_CLINIC_OR_DEPARTMENT_OTHER): Payer: Medicare Other

## 2019-10-08 ENCOUNTER — Other Ambulatory Visit: Payer: Self-pay

## 2019-10-08 DIAGNOSIS — K921 Melena: Secondary | ICD-10-CM | POA: Diagnosis not present

## 2019-10-08 DIAGNOSIS — K529 Noninfective gastroenteritis and colitis, unspecified: Secondary | ICD-10-CM | POA: Diagnosis not present

## 2019-10-08 DIAGNOSIS — N9489 Other specified conditions associated with female genital organs and menstrual cycle: Secondary | ICD-10-CM

## 2019-10-08 DIAGNOSIS — J440 Chronic obstructive pulmonary disease with acute lower respiratory infection: Secondary | ICD-10-CM | POA: Diagnosis present

## 2019-10-08 DIAGNOSIS — E876 Hypokalemia: Secondary | ICD-10-CM | POA: Diagnosis present

## 2019-10-08 DIAGNOSIS — Z881 Allergy status to other antibiotic agents status: Secondary | ICD-10-CM | POA: Diagnosis not present

## 2019-10-08 DIAGNOSIS — K559 Vascular disorder of intestine, unspecified: Secondary | ICD-10-CM | POA: Diagnosis not present

## 2019-10-08 DIAGNOSIS — G0481 Other encephalitis and encephalomyelitis: Secondary | ICD-10-CM | POA: Diagnosis not present

## 2019-10-08 DIAGNOSIS — R29810 Facial weakness: Secondary | ICD-10-CM | POA: Diagnosis not present

## 2019-10-08 DIAGNOSIS — R1030 Lower abdominal pain, unspecified: Secondary | ICD-10-CM

## 2019-10-08 DIAGNOSIS — Z7982 Long term (current) use of aspirin: Secondary | ICD-10-CM | POA: Diagnosis not present

## 2019-10-08 DIAGNOSIS — Z87891 Personal history of nicotine dependence: Secondary | ICD-10-CM | POA: Diagnosis not present

## 2019-10-08 DIAGNOSIS — M8580 Other specified disorders of bone density and structure, unspecified site: Secondary | ICD-10-CM | POA: Diagnosis present

## 2019-10-08 DIAGNOSIS — Z79899 Other long term (current) drug therapy: Secondary | ICD-10-CM

## 2019-10-08 DIAGNOSIS — E785 Hyperlipidemia, unspecified: Secondary | ICD-10-CM | POA: Diagnosis present

## 2019-10-08 DIAGNOSIS — Z9049 Acquired absence of other specified parts of digestive tract: Secondary | ICD-10-CM

## 2019-10-08 DIAGNOSIS — A044 Other intestinal Escherichia coli infections: Secondary | ICD-10-CM | POA: Diagnosis not present

## 2019-10-08 DIAGNOSIS — I700 Atherosclerosis of aorta: Secondary | ICD-10-CM | POA: Diagnosis not present

## 2019-10-08 DIAGNOSIS — Z7902 Long term (current) use of antithrombotics/antiplatelets: Secondary | ICD-10-CM | POA: Diagnosis not present

## 2019-10-08 DIAGNOSIS — Z86010 Personal history of colonic polyps: Secondary | ICD-10-CM | POA: Diagnosis not present

## 2019-10-08 DIAGNOSIS — R1032 Left lower quadrant pain: Secondary | ICD-10-CM | POA: Diagnosis not present

## 2019-10-08 DIAGNOSIS — Z7951 Long term (current) use of inhaled steroids: Secondary | ICD-10-CM

## 2019-10-08 DIAGNOSIS — R1031 Right lower quadrant pain: Secondary | ICD-10-CM | POA: Diagnosis not present

## 2019-10-08 DIAGNOSIS — Z20822 Contact with and (suspected) exposure to covid-19: Secondary | ICD-10-CM | POA: Diagnosis present

## 2019-10-08 DIAGNOSIS — R933 Abnormal findings on diagnostic imaging of other parts of digestive tract: Secondary | ICD-10-CM | POA: Diagnosis not present

## 2019-10-08 DIAGNOSIS — N839 Noninflammatory disorder of ovary, fallopian tube and broad ligament, unspecified: Secondary | ICD-10-CM | POA: Diagnosis present

## 2019-10-08 LAB — COMPREHENSIVE METABOLIC PANEL
ALT: 18 U/L (ref 0–44)
AST: 23 U/L (ref 15–41)
Albumin: 3.2 g/dL — ABNORMAL LOW (ref 3.5–5.0)
Alkaline Phosphatase: 144 U/L — ABNORMAL HIGH (ref 38–126)
Anion gap: 14 (ref 5–15)
BUN: 12 mg/dL (ref 8–23)
CO2: 23 mmol/L (ref 22–32)
Calcium: 9.3 mg/dL (ref 8.9–10.3)
Chloride: 105 mmol/L (ref 98–111)
Creatinine, Ser: 0.82 mg/dL (ref 0.44–1.00)
GFR calc Af Amer: >60 mL/min (ref >60)
GFR calc non Af Amer: >60 mL/min (ref >60)
Glucose, Bld: 150 mg/dL — ABNORMAL HIGH (ref 70–99)
Potassium: 3.9 mmol/L (ref 3.5–5.1)
Sodium: 142 mmol/L (ref 135–145)
Total Bilirubin: 0.2 mg/dL — ABNORMAL LOW (ref 0.3–1.2)
Total Protein: 7.2 g/dL (ref 6.5–8.1)

## 2019-10-08 LAB — CBC WITH DIFFERENTIAL/PLATELET
Abs Immature Granulocytes: 0.15 10*3/uL — ABNORMAL HIGH (ref 0.00–0.07)
Basophils Absolute: 0.1 10*3/uL (ref 0.0–0.1)
Basophils Relative: 0 %
Eosinophils Absolute: 0 10*3/uL (ref 0.0–0.5)
Eosinophils Relative: 0 %
HCT: 40.1 % (ref 36.0–46.0)
Hemoglobin: 12.4 g/dL (ref 12.0–15.0)
Immature Granulocytes: 1 %
Lymphocytes Relative: 2 %
Lymphs Abs: 0.5 10*3/uL — ABNORMAL LOW (ref 0.7–4.0)
MCH: 25.4 pg — ABNORMAL LOW (ref 26.0–34.0)
MCHC: 30.9 g/dL (ref 30.0–36.0)
MCV: 82.2 fL (ref 80.0–100.0)
Monocytes Absolute: 0.9 10*3/uL (ref 0.1–1.0)
Monocytes Relative: 4 %
Neutro Abs: 20.9 10*3/uL — ABNORMAL HIGH (ref 1.7–7.7)
Neutrophils Relative %: 93 %
Platelets: 423 10*3/uL — ABNORMAL HIGH (ref 150–400)
RBC: 4.88 MIL/uL (ref 3.87–5.11)
RDW: 14.5 % (ref 11.5–15.5)
WBC: 22.4 10*3/uL — ABNORMAL HIGH (ref 4.0–10.5)
nRBC: 0 % (ref 0.0–0.2)

## 2019-10-08 LAB — LIPASE, BLOOD: Lipase: 21 U/L (ref 11–51)

## 2019-10-08 LAB — LACTIC ACID, PLASMA: Lactic Acid, Venous: 1.8 mmol/L (ref 0.5–1.9)

## 2019-10-08 LAB — OCCULT BLOOD X 1 CARD TO LAB, STOOL: Fecal Occult Bld: POSITIVE — AB

## 2019-10-08 LAB — SARS CORONAVIRUS 2 BY RT PCR (HOSPITAL ORDER, PERFORMED IN ~~LOC~~ HOSPITAL LAB): SARS Coronavirus 2: NEGATIVE

## 2019-10-08 MED ORDER — ONDANSETRON HCL 4 MG/2ML IJ SOLN
4.0000 mg | Freq: Four times a day (QID) | INTRAMUSCULAR | Status: DC | PRN
  Administered 2019-10-09: 4 mg via INTRAVENOUS
  Filled 2019-10-08: qty 2

## 2019-10-08 MED ORDER — MORPHINE SULFATE (PF) 4 MG/ML IV SOLN
4.0000 mg | Freq: Once | INTRAVENOUS | Status: AC
  Administered 2019-10-08: 4 mg via INTRAVENOUS
  Filled 2019-10-08: qty 1

## 2019-10-08 MED ORDER — SODIUM CHLORIDE 0.9 % IV SOLN
1.0000 g | Freq: Once | INTRAVENOUS | Status: DC
  Filled 2019-10-08: qty 1

## 2019-10-08 MED ORDER — SODIUM CHLORIDE 0.9 % IV SOLN
2.0000 g | INTRAVENOUS | Status: DC
  Filled 2019-10-08: qty 20

## 2019-10-08 MED ORDER — SODIUM CHLORIDE 0.9 % IV SOLN
2.0000 g | Freq: Once | INTRAVENOUS | Status: AC
  Administered 2019-10-08: 2 g via INTRAVENOUS
  Filled 2019-10-08: qty 2

## 2019-10-08 MED ORDER — ONDANSETRON HCL 4 MG PO TABS: 4.0000 mg | ORAL_TABLET | Freq: Four times a day (QID) | ORAL | Status: DC | PRN

## 2019-10-08 MED ORDER — OXYCODONE HCL 5 MG PO TABS
5.0000 mg | ORAL_TABLET | ORAL | Status: DC | PRN
  Administered 2019-10-08 – 2019-10-11 (×6): 5 mg via ORAL
  Filled 2019-10-08 (×6): qty 1

## 2019-10-08 MED ORDER — METRONIDAZOLE IN NACL 5-0.79 MG/ML-% IV SOLN
500.0000 mg | Freq: Three times a day (TID) | INTRAVENOUS | Status: DC
  Administered 2019-10-08: 500 mg via INTRAVENOUS
  Filled 2019-10-08: qty 100

## 2019-10-08 MED ORDER — METRONIDAZOLE IN NACL 5-0.79 MG/ML-% IV SOLN
500.0000 mg | Freq: Once | INTRAVENOUS | Status: AC
  Administered 2019-10-08: 500 mg via INTRAVENOUS
  Filled 2019-10-08: qty 100

## 2019-10-08 MED ORDER — SODIUM CHLORIDE 0.9 % IV BOLUS
1000.0000 mL | Freq: Once | INTRAVENOUS | Status: AC
  Administered 2019-10-08: 1000 mL via INTRAVENOUS

## 2019-10-08 MED ORDER — ONDANSETRON HCL 4 MG/2ML IJ SOLN
4.0000 mg | Freq: Once | INTRAMUSCULAR | Status: AC
  Administered 2019-10-08: 4 mg via INTRAVENOUS
  Filled 2019-10-08: qty 2

## 2019-10-08 MED ORDER — SODIUM CHLORIDE 0.9 % IV SOLN: Freq: Once | INTRAVENOUS | Status: AC

## 2019-10-08 MED ORDER — SODIUM CHLORIDE 0.9 % IV SOLN: INTRAVENOUS | Status: DC

## 2019-10-08 MED ORDER — ACETAMINOPHEN 325 MG PO TABS: 650.0000 mg | ORAL_TABLET | Freq: Four times a day (QID) | ORAL | Status: DC | PRN

## 2019-10-08 MED ORDER — ACETAMINOPHEN 650 MG RE SUPP: 650.0000 mg | Freq: Four times a day (QID) | RECTAL | Status: DC | PRN

## 2019-10-08 MED ORDER — DICYCLOMINE HCL 10 MG PO CAPS
10.0000 mg | ORAL_CAPSULE | Freq: Two times a day (BID) | ORAL | Status: DC | PRN
  Administered 2019-10-08: 10 mg via ORAL
  Filled 2019-10-08 (×2): qty 1

## 2019-10-08 MED ORDER — MORPHINE SULFATE (PF) 2 MG/ML IV SOLN: 2.0000 mg | INTRAVENOUS | Status: DC | PRN

## 2019-10-08 MED ORDER — IOHEXOL 350 MG/ML SOLN
100.0000 mL | Freq: Once | INTRAVENOUS | Status: AC | PRN
  Administered 2019-10-08: 100 mL via INTRAVENOUS

## 2019-10-08 NOTE — Telephone Encounter (Signed)
Called office by error, needing a GI consult

## 2019-10-08 NOTE — Consult Note (Signed)
Referring Provider: Dr. Melina Copa at [facility] ED Primary Care Physician:  Mosie Lukes, MD Primary Gastroenterologist:  Dr.Gessner   Reason for Consultation:  Hematochezia, colitis   HPI: Danielle Fox is a 69 y.o. female with a past medical history of osteopenia, pre-diabetes, questionable stroke 09/15/2019, COPD, positive TB skin test, pyloric stenosis, C. difficile 09/2018 and hyperplastic colon polyp.   She developed facial drooping and left sides weakness while in [place] on 09/15/2019. She was admitted to [facility] and she was initially diagnosed with a stroke. CT head was negative but MRI of brain with and without constrast showed subtle enhancement involving the right posterior frontal lobe and anterior right parietal lobe, which was thought to be subacute cortical infarcts.  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 Her left facial droop and left arm weakness resolved and she was discharged home on ASA81 mg daily and Atorvastatin 62m QD.   [facility] due to recurrence of  facial drooping with left-sided arm weakness [place] showed no acute intracranial abnormality.  CTA of head and neck  demonstrated approximately 60% stenosis of proximal left subclavian artery but no large vessel occlusion.  MRI of brain with and without contrast showed persistent flair signal in the sulci over the superior right frontal convexity and left cingulate sulci, although no associated abnormal enhancement and no evidence of stroke or blood concerning for for possible rheumatoid meningitis or indolent meningitis though no fever, neck pain, no meningeal enhancement, no encephalopathy. She underwent LP that showed WBC of 43, slightly elevated segmented cells of 19%, glu 42, prot 50. Gram stain negative, AFB smear negative. CT of chest/abdomen/pelvis revealed  no malignancy but did demonstrate a 10 cm cyst in the left adnexum. She was started on Plavix 10/02/2019. 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 She saw her PCP who started her on Keppra for seizure prophylaxis with plans to repeat a brain MRI in 4 weeks.  No further left upper extremity weakness or facial droopiness.  To follow with neurology.  [facility] with complaints of lower abdominal pain which started around midnight.  Upon entering the ED, she vomited clear emesis x1.  She passed a large "mahogany colored soft stool"  while in the ED as well followed by 3-4 loose mahogany colored stools.  No bright red rectal bleeding. No dysphagia, heartburn or upper abdominal pain.  Her last dose of Plavix was taken on Saturday morning 8/7 and aspirin 81 mg was taken on Friday evening.  No other NSAID use. History of C. difficile 09/2018.  No recent antibiotic use.  She reports not feeling herself since she donated blood at her church in April followed by 2 episodes of syncope.  She has lost 10 to 12 pounds over the past 4 weeks.  Appetite significantly diminished.  She had sweats yesterday with Temp in the 85F.  No alcohol use.  She smokes 1 pack of cigarettes daily since her early 279sand quit 5 years ago.  No family history of esophageal, gastric, colon or pancreatic cancer.  She underwent a colonoscopy by Dr. GCarlean Purl9/23/2020 which showed a benign stricture to the sigmoid colon and diverticulosis throughout the colon.  No polyps.  History of pyloric stenosis dilated at the time of an EGD 03/21/2019  and 11/19/2018 by Dr. Carlean Purl.  Her significant other Shanon Brow is present.  ED course: Sodium 142.  Potassium 3.9.  Glucose 150.  BUN 12.  Creatinine 0.82.  Alk phos 144.  Albumin 3.2.  Lipase 21.  AST 23.  ALT 18.  Total bili 0.2.  WBC 22.4.  Hemoglobin 12.4.  Hematocrit 40.1.  Platelet 423.  Lactic acid 1.8.  SARS coronavirus 2  negative.   Abdominal/pelvic CT angiogram: 1. No evidence of significant mesenteric arterial occlusive disease. 2. New thickening and inflammation of the transverse and descending colon since the prior study 8 days ago with surrounding edema and a small amount of free fluid in the left pericolic gutter and dependent pelvis. Findings are consistent with acute colitis. No evidence of associated colonic perforation or focal abscess. The colon proximal to the segment of colitis demonstrates some dilatation. 3. Stable large cyst associated with the left adnexal region measuring up to 8.8 cm and demonstrating simple fluid density internally. However, further characterization with pelvic ultrasound is recommended as well as referral to gynecology as previously recommended, as a large postmenopausal adnexal cyst will need to be followed and further characterized.  EGD 03/16/2019 by Dr. Carlean Purl:  - Gastric stenosis was found at the pylorus. Dilated. Biopsied. - Erosive gastropathy with no stigmata of recent bleeding. Biopsied. - The examination was otherwise normal.  EGD 11/22/2018:  - Gastric stenosis was found at the pylorus. Biopsied. Dilated. - Gastritis. Biopsied. - The examination was otherwise normal. - Biopsies were taken with a cold forceps for histology in the second portion of the duodenum.  Colonoscopy by Dr. Carlean Purl 11/22/2018 due to having abdominal cramping and diarrhea:  - Diverticulosis in the entire examined colon. - Stricture in the sigmoid colon. - The examined portion of the ileum was normal. Biopsied. - The examination was otherwise normal on direct and retroflexion views. - Biopsies were taken with a cold forceps from the entire colon for evaluation of microscopic colitis.  ECHO 09/27/2019:  1. Left ventricular ejection fraction, by estimation, is 60 to 65%. The left ventricle has normal function. The left ventricle has no regional wall motion abnormalities. Left  ventricular diastolic parameters were normal. 2. Right ventricular systolic function is normal. The right ventricular size is normal. Tricuspid regurgitation signal is inadequate for assessing PA pressure. 3. The mitral valve is normal in structure. No evidence of mitral valve regurgitation. 4. The aortic valve was not well visualized. Aortic valve regurgitation is not visualized. No aortic stenosis is present.   Past Medical History:  Diagnosis Date  . Allergy   . Clostridioides difficile infection 10/19/2018   tested 10/2018 negative  . Colon polyps   . Constipation 10/06/2014  . COPD (chronic obstructive pulmonary disease) ([place])   . Osteopenia   . 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 Preventative health care 06/29/2016  . Pruritus 06/29/2016  . Pyloric stenosis in adult 05/2019  . Welcome to Medicare preventive visit 06/29/2016    Past Surgical History:  Procedure Laterality Date  . APPENDECTOMY    . COLONOSCOPY     01-14-2005  . SINUS IRRIGATION  04/02/2015  . UPPER GASTROINTESTINAL ENDOSCOPY  11/22/2018    Prior to Admission medications   Medication Sig Start Date End Date Taking? Authorizing Provider  aspirin 81 MG EC tablet Take 1 tablet (81 mg total) by mouth in the morning and at bedtime. 09/18/19 10/19/19  Mosie Lukes, MD  atorvastatin (LIPITOR) 40 MG tablet Take 1  tablet by mouth at bedtime. 09/16/19 10/17/19  [provider]  budesonide-formoterol (SYMBICORT) 80-4.5 MCG/ACT inhaler Inhale 2 puffs into the lungs in the morning and at bedtime. Patient taking differently: Inhale 2 puffs into the lungs 2 (two) times daily as needed (wheezing, shortness of breath).  07/09/19   Mosie Lukes, MD  clopidogrel (PLAVIX) 75 MG tablet Take 1 tablet (75 mg total) by mouth daily. 10/02/19 11/01/19  Little Ishikawa, MD  esomeprazole (NEXIUM) 40 MG capsule Take 1 capsule (40 mg total) by mouth daily before breakfast. 03/16/19   Gatha Mayer, MD  gabapentin (NEURONTIN)  100 MG capsule Take 3 capsules (300 mg total) by mouth 3 (three) times daily. 10/01/19 10/31/19  Little Ishikawa, MD  levETIRAcetam (KEPPRA) 500 MG tablet Take 1 tablet (500 mg total) by mouth 2 (two) times daily. 10/02/19   Tomi Likens, Adam R, DO  ondansetron (ZOFRAN) 4 MG tablet Take 1 tablet (4 mg total) by mouth every 8 (eight) hours as needed for nausea or vomiting. 10/02/19   Pieter Partridge, DO    No current facility-administered medications for this encounter.   Current Outpatient Medications  Medication Sig Dispense Refill  . aspirin 81 MG EC tablet Take 1 tablet (81 mg total) by mouth in the morning and at bedtime. 30 tablet   . atorvastatin (LIPITOR) 40 MG tablet Take 1 tablet by mouth at bedtime.    . budesonide-formoterol (SYMBICORT) 80-4.5 MCG/ACT inhaler Inhale 2 puffs into the lungs in the morning and at bedtime. (Patient taking differently: Inhale 2 puffs into the lungs 2 (two) times daily as needed (wheezing, shortness of breath). ) 30.6 g 1  . clopidogrel (PLAVIX) 75 MG tablet Take 1 tablet (75 mg total) by mouth daily. 30 tablet 01  . esomeprazole (NEXIUM) 40 MG capsule Take 1 capsule (40 mg total) by mouth daily before breakfast. 90 capsule 3  . gabapentin (NEURONTIN) 100 MG capsule Take 3 capsules (300 mg total) by mouth 3 (three) times daily. 270 capsule 01  . levETIRAcetam (KEPPRA) 500 MG tablet Take 1 tablet (500 mg total) by mouth 2 (two) times daily. 60 tablet 5  . ondansetron (ZOFRAN) 4 MG tablet Take 1 tablet (4 mg total) by mouth every 8 (eight) hours as needed for nausea or vomiting. 30 tablet 5    Allergies as of 10/08/2019 - Review Complete 10/08/2019  Allergen Reaction Noted  . Doxycycline Nausea And Vomiting 08/30/2017  . Erythromycin base  08/14/2015    Family History  Problem Relation Age of Onset  . Heart disease Mother        cabg, MI  . Alcohol abuse Father   . Cirrhosis Father   . Heart disease Father        MI  . Colon cancer Neg Hx   . Colon polyps  Neg Hx   . Esophageal cancer Neg Hx   . Stomach cancer Neg Hx   . Rectal cancer Neg Hx     Social History   Socioeconomic History  . Marital status: Significant Other    Spouse name: Not on file  . Number of children: 3  . Years of education: Not on file  . Highest education level: Not on file  Occupational History  . 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 Smoking status: Former Smoker    Packs/day: 1.00    Years: 48.00    Pack years: 48.00    Types: Cigarettes  Quit date: 02/19/2014    Years since quitting: 5.6  . Smokeless tobacco: Never Used  . Tobacco comment: Quit and has no desire to smoke again,  Vaping Use  . Vaping Use: Never used  Substance and Sexual Activity  . Alcohol use: Yes    Alcohol/week: 0.0 standard drinks    Comment: socially  . Drug use: No  . Sexual activity: Yes    Partners: Male  Other Topics Concern  . Not on file  Social History Narrative   Exercise-- walks dog on occasion   Right Handed   One Story Home   Does not drink caffeine    Social Determinants of Health   Financial Resource Strain:   . Difficulty of Paying Living Expenses:   Food Insecurity:   . Worried About Charity fundraiser in the Last Year:   . Arboriculturist in the Last Year:   Transportation Needs:   . Film/video editor (Medical):   Marland Kitchen Lack of Transportation (Non-Medical):   Physical Activity:   . Days of Exercise per Week:   . Minutes of Exercise per Session:   Stress:   . Feeling of Stress :   Social Connections:   . Frequency of Communication with Friends and Family:   . Frequency of Social Gatherings with Friends and Family:   . Attends Religious Services:   . Active Member of Clubs or Organizations:   . Attends Archivist Meetings:   Marland Kitchen Marital Status:   Intimate Partner Violence:   . Fear of Current or Ex-Partner:   . Emotionally Abused:   Marland Kitchen Physically Abused:   . Sexually Abused:     Review of  Systems: Gen: See HPI. CV: Denies chest pain, palpitations or edema. Resp: Denies cough, shortness of breath of hemoptysis.  GI: See HPI. GU : Denies urinary burning, blood in urine, increased urinary frequency or incontinence. MS: Denies joint pain, muscles aches or weakness. Derm: Denies rash, itchiness, skin lesions or unhealing ulcers. Psych: Denies depression, anxiety, memory loss confusion. Heme: Denies easy bruising, bleeding. Neuro: See HPI. Endo:  Denies any problems with DM, thyroid or adrenal function.  Physical Exam: Vital signs in last 24 hours: Temp:  [98.4 F (36.9 C)-98.5 F (36.9 C)] 98.4 F (36.9 C) (08/09 0807) Pulse Rate:  [98] 98 (08/09 0809) Resp:  [20] 20 (08/09 0536) BP: (107-120)/(49-58) 107/49 (08/09 0809) SpO2:  [95 %-97 %] 95 % (08/09 0809) Weight:  [50.8 kg] 50.8 kg (08/09 0536)   General: Fatigued appearing 69 year old female in no acute distress. Head:  Normocephalic and atraumatic. Eyes:  No scleral icterus. Conjunctiva pink. Ears:  Normal auditory acuity. Nose:  No deformity, discharge or lesions. Mouth: Scattered missing dentition.  No ulcers or lesions.  Dry buccal mucosa and tongue. Neck:  Supple. No lymphadenopathy or thyromegaly.  Lungs: Breath sounds clear, diminished in the bases. Heart: Regular rate and rhythm, no murmurs. Abdomen: Soft, moderately distended throughout the lower abdomen without rebound or guarding.  Hypoactive bowel sounds to all 4 quadrants. Rectal: No external hemorrhoids.  A scant amount of dark or red blood with brown stool noted on exam glove, grossly heme positive.  No melena.  No mass. Musculoskeletal:  Symmetrical without gross deformities.  Pulses:  Normal pulses noted. Extremities:  Without clubbing or edema. Neurologic:  Alert and  oriented x4. No focal deficits.  Skin:  Intact without significant lesions or rashes. Psych:  Alert and cooperative. Normal mood and affect.  Intake/Output from previous  day: No intake/output data recorded. Intake/Output this shift: Total I/O In: 1000 [IV Piggyback:1000] Out: -   Lab Results: Recent Labs    10/08/19 0639  WBC 22.4*  HGB 12.4  HCT 40.1  PLT 423*   BMET Recent Labs    10/08/19 0639  NA 142  K 3.9  CL 105  CO2 23  GLUCOSE 150*  BUN 12  CREATININE 0.82  CALCIUM 9.3   LFT Recent Labs    10/08/19 0639  PROT 7.2  ALBUMIN 3.2*  AST 23  ALT 18  ALKPHOS 144*  BILITOT 0.2*   PT/INR No results for input(s): LABPROT, INR in the last 72 hours. Hepatitis Panel No results for input(s): HEPBSAG, HCVAB, HEPAIGM, HEPBIGM in the last 72 hours.    Studies/Results: CT Angio Abd/Pel W and/or Wo Contrast  Result Date: 10/08/2019 CLINICAL DATA:  Abdominal pain, nausea, vomiting, GI bleed and difficulty having bowel movements. EXAM: CT ANGIOGRAPHY ABDOMEN AND PELVIS WITH CONTRAST TECHNIQUE: Multidetector CT imaging of the abdomen and pelvis was performed using the standard protocol during bolus administration of intravenous contrast. Multiplanar reconstructed images and MIPs were obtained and reviewed to evaluate the vascular anatomy. CONTRAST:  17m OMNIPAQUE IOHEXOL 350 MG/ML SOLN COMPARISON:  CT of the abdomen and pelvis on 09/30/2019 FINDINGS: VASCULAR Aorta: Atherosclerosis of the abdominal aorta without evidence of aneurysm or dissection. Celiac: Normally patent. Normally patent branch vessels demonstrating normal branch vessel anatomy. SMA: Normally patent. Renals: Bilateral single renal arteries demonstrate normal patency. IMA: Normally patent. Inflow: Bilateral common iliac arteries demonstrate calcified plaque without evidence of stenosis or aneurysm. Atherosclerosis of bilateral internal iliac arteries. External iliac arteries demonstrate normal patency. Proximal Outflow: Normally patent bilateral common femoral arteries and femoral bifurcations. Veins: Venous phase imaging demonstrates normal patency of the portal vein, splenic  vein, mesenteric veins, IVC, renal veins, iliac veins and common femoral veins. Review of the MIP images confirms the above findings. NON-VASCULAR Lower chest: No acute abnormality. Hepatobiliary: No focal liver abnormality is seen. No gallstones, gallbladder wall thickening, or biliary dilatation. Pancreas: Unremarkable. No pancreatic ductal dilatation or surrounding inflammatory changes. Spleen: Normal in size without focal abnormality. Adrenals/Urinary Tract: Adrenal glands are unremarkable. Kidneys are normal, without renal calculi, focal lesion, or hydronephrosis. Bladder is unremarkable. Stomach/Bowel: New thickening and inflammation of transverse and descending colon since the prior study 8 days ago with surrounding edema and a small amount of free fluid in the left pericolic gutter and dependent pelvis. Findings are consistent with colitis with the most significant thickening at the level of the distal transverse colon and splenic flexure. No evidence of associated colonic perforation or focal abscess. The colon proximal to segment of colitis demonstrates some dilatation. There is no evidence of small-bowel obstruction. The sigmoid colon and rectum are decompressed. Lymphatic: No enlarged abdominal or pelvic lymph nodes. Reproductive: Again noted is a large cyst associated with the left adnexal region measuring up to 8.8 cm and demonstrating simple fluid density internally. No obvious solid enhancing tissue component by CT. However, further characterization with pelvic ultrasound is recommended as well as referral to gynecology as recommended previously as large postmenopausal cysts will need to be followed and further characterized. Other: No hernias identified. Musculoskeletal: No acute findings. Stable anterolisthesis of L4 on L5. IMPRESSION: 1. No evidence of significant mesenteric arterial occlusive disease. 2. New thickening and inflammation of the transverse and descending colon since the prior study  8 days ago with surrounding edema and a small amount  of free fluid in the left pericolic gutter and dependent pelvis. Findings are consistent with acute colitis. No evidence of associated colonic perforation or focal abscess. The colon proximal to the segment of colitis demonstrates some dilatation. 3. Stable large cyst associated with the left adnexal region measuring up to 8.8 cm and demonstrating simple fluid density internally. However, further characterization with pelvic ultrasound is recommended as well as referral to gynecology as previously recommended, as a large postmenopausal adnexal cyst will need to be followed and further characterized. Electronically Signed   By: Aletta Edouard M.D.   On: 10/08/2019 08:22    IMPRESSION/PLAN:  20.  69 year old female with lower abdominal pain and dark red "mahogany"  colored loose stools grossly heme positive.  Abd/pelvic CT angiogram identified colitis to the transverse and ascending colon.  WBC 22.4. Lactic Acid 1.8. She is afebrile. Most  likely ischemic colitis.  Last dose of Plavix was on 8/7.  -Stop IV antibiotics -IV hydration per the hospitalist -Await C. difficile test results -Continue  to hold Plavix for now -Dicyclomine 83m on po bid PRN for abdominal pain  -Clear liquid diet  2. Questionable CVA verses autoimmune disorder -Continue follow up with neurology   3. A 8.8 cm left ovarian mass/cyst -Recommend gyn consult to assess ovarian mass  4. Alk phos elevated with normal AST/ALT and T. Bili levels -CMP and GGT in am  Further recommendations per Dr. PMaurine MinisterMDorathy Daft 10/08/2019, 11:16 AM

## 2019-10-08 NOTE — ED Provider Notes (Signed)
Signout from Dr. Dina Rich.  69 year old female history of COPD C. difficile.  Here with acute onset of lower abdominal pain and bloody diarrhea. Physical Exam  BP (!) 107/49 Comment: room air  Pulse 98 Comment: room air  Temp 98.4 F (36.9 C) (Oral)   Resp 20   Ht 5\' 1"  (1.549 m)   Wt 50.8 kg   SpO2 95% Comment: room air  BMI 21.16 kg/m   Physical Exam  ED Course/Procedures   Clinical Course as of Oct 07 2028  Mon Oct 08, 2019  0656 Patient has since had 2 large bloody bowel movements in the emergency room.  Given acute abdominal pain and bloody bowel movements, would be concern for colitis either infectious or ischemic.  Will cover with antibiotics and obtain a CTA.  I have reviewed her CT scan from just 10/01/2019.  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   [CH]  1031 Discussed with Dr. Cyril Mourning [facility] hospitalist who asked if I could order stool studies.  He is putting in for a bed for admission.   [MB]    Clinical Course User Index [CH] Horton, Barbette Hair, MD [MB] Hayden Rasmussen, MD    Procedures  MDM  CT showing evidence of colitis.  She also has an ovarian mass.  Will need admission to the hospital for further work-up.  IV antibiotics infusing.  Blood pressure a little soft I ordered some maintenance fluids.  I have a page out to GI, she seen Dr. Dominga Ferry in the past.  Discussed with [facility] who agrees with current management of antibiotics fluids.  Discussed with [facility] hospitalist Dr. Maryland Pink who accepts the patient in transfer.     Hayden Rasmussen, MD 10/08/19 2030

## 2019-10-08 NOTE — ED Triage Notes (Signed)
Pt reports bilateral lower abd pain and N/V starting at midnight; pt reports not having a BM for a week; pt reports having been in the hospital for suspected stroke but that they determined it was not a stroke; pt A&Ox4; pt reports having been vaccinated for COVID

## 2019-10-08 NOTE — Telephone Encounter (Signed)
Abby from [facility] ED is requesting a call back from a nurse. Dr Melina Copa would like to discuss with a provider regarding the pt's abdominal pain and bloody stools  [phone]

## 2019-10-08 NOTE — ED Provider Notes (Signed)
[facility] EMERGENCY DEPARTMENT Provider Note   CSN: [id number] Arrival date & time: 10/08/19  5035     History Chief Complaint  Patient presents with  . Abdominal Pain    Danielle Fox is a 69 y.o. female.  HPI     This is a 69 year old female with a history of COPD, C. difficile infection who presents with abdominal pain.  Patient reports that she woke up with acute bilateral lower abdominal pain around midnight.  She reports multiple episodes of nonbilious, nonbloody emesis.  She had not had a bowel movement in over 1 week but had a bowel movement in the room just prior to my evaluation which was large.  She denies fevers.  She reports lower abdominal pain is nonradiating.  She rates her pain at 10 out of 10.  She is not taking anything for pain.  Recent admission for strokelike symptoms with unknown diagnosis.  She did have some MRI findings that were abnormal.  She was started on Keppra and is being followed by outpatient neurology.  Patient denies urinary symptoms.  No back pain.  Past Medical History:  Diagnosis Date  . Allergy   . Clostridioides difficile infection 10/19/2018   tested 10/2018 negative  . Colon polyps   . Constipation 10/06/2014  . COPD (chronic obstructive pulmonary disease) ([place])   . Osteopenia   . 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 Preventative health care 06/29/2016  . Pruritus 06/29/2016  . Pyloric stenosis in adult 05/2019  . Welcome to Medicare preventive visit 06/29/2016    Patient Active Problem List   Diagnosis Date Noted  . Meningitis   . Adnexal mass   . 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 CVA (cerebral vascular accident) ([place]) 09/27/2019  . H/O: CVA (cerebrovascular accident) 09/19/2019  . Chills 09/19/2019  . Hypokalemia 09/19/2019  . Clostridioides difficile infection   . Diarrhea 09/24/2018  . Vitamin D deficiency 01/02/2017  . Welcome to Medicare preventive visit 06/29/2016  . Pruritus 06/29/2016  . Abnormal CT  scan 08/14/2015  . Constipation 10/06/2014  . 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 Rib pain on left side 09/08/2014  . Chronic cough 06/14/2014  . SOB (shortness of breath) 06/14/2014  . Cough 05/20/2014  . Hyperlipidemia, mild 08/15/2011  . Osteopenia 03/01/2011  . History of smoking 03/01/2011  . Musculoskeletal pain 03/01/2011    Past Surgical History:  Procedure Laterality Date  . APPENDECTOMY    . COLONOSCOPY     01-14-2005  . SINUS IRRIGATION  04/02/2015  . UPPER GASTROINTESTINAL ENDOSCOPY  11/22/2018     OB History   No obstetric history on file.     Family History  Problem Relation Age of Onset  . Heart disease Mother        cabg, MI  . Alcohol abuse Father   . Cirrhosis Father   . Heart disease Father        MI  . Colon cancer Neg Hx   . Colon polyps Neg Hx   . Esophageal cancer Neg Hx   . Stomach cancer Neg Hx   . Rectal cancer Neg Hx     Social History   Tobacco Use  . Smoking status: Former Smoker    Packs/day: 1.00    Years: 48.00    Pack years: 48.00    Types: Cigarettes    Quit date: 02/19/2014    Years since quitting: 5.6  . Smokeless tobacco: Never Used  .  Tobacco comment: Quit and has no desire to smoke again,  Vaping Use  . Vaping Use: Never used  Substance Use Topics  . Alcohol use: Yes    Alcohol/week: 0.0 standard drinks    Comment: socially  . Drug use: No    Home Medications Prior to Admission medications   Medication Sig Start Date End Date Taking? Authorizing Provider  aspirin 81 MG EC tablet Take 1 tablet (81 mg total) by mouth in the morning and at bedtime. 09/18/19 10/19/19  Mosie Lukes, MD  atorvastatin (LIPITOR) 40 MG tablet Take 1 tablet by mouth at bedtime. 09/16/19 10/17/19  [provider]  budesonide-formoterol (SYMBICORT) 80-4.5 MCG/ACT inhaler Inhale 2 puffs into the lungs in the morning and at bedtime. Patient taking differently: Inhale 2 puffs into the lungs 2 (two) times daily as needed (wheezing, shortness of  breath).  07/09/19   Mosie Lukes, MD  clopidogrel (PLAVIX) 75 MG tablet Take 1 tablet (75 mg total) by mouth daily. 10/02/19 11/01/19  Little Ishikawa, MD  esomeprazole (NEXIUM) 40 MG capsule Take 1 capsule (40 mg total) by mouth daily before breakfast. 03/16/19   Gatha Mayer, MD  gabapentin (NEURONTIN) 100 MG capsule Take 3 capsules (300 mg total) by mouth 3 (three) times daily. 10/01/19 10/31/19  Little Ishikawa, MD  levETIRAcetam (KEPPRA) 500 MG tablet Take 1 tablet (500 mg total) by mouth 2 (two) times daily. 10/02/19   Tomi Likens, Adam R, DO  ondansetron (ZOFRAN) 4 MG tablet Take 1 tablet (4 mg total) by mouth every 8 (eight) hours as needed for nausea or vomiting. 10/02/19   Pieter Partridge, DO    Allergies    Doxycycline and Erythromycin base  Review of Systems   Review of Systems  Constitutional: Negative for fever.  Respiratory: Negative for shortness of breath.   Cardiovascular: Negative for chest pain.  Gastrointestinal: Positive for abdominal pain, constipation, diarrhea, nausea and vomiting. Negative for blood in stool.  Genitourinary: Negative for dysuria.  Musculoskeletal: Negative for back pain.  All other systems reviewed and are negative.   Physical Exam Updated Vital Signs BP (!) 120/58 (BP Location: Right Arm)   Pulse 98   Temp 98.5 F (36.9 C) (Oral)   Resp 20   Ht 1.549 m (5\' 1" )   Wt 50.8 kg   SpO2 95%   BMI 21.16 kg/m   Physical Exam Vitals and nursing note reviewed.  Constitutional:      Appearance: She is well-developed. She is not ill-appearing.  HENT:     Head: Normocephalic and atraumatic.  Eyes:     Pupils: Pupils are equal, round, and reactive to light.  Cardiovascular:     Rate and Rhythm: Normal rate and regular rhythm.     Heart sounds: Normal heart sounds.  Pulmonary:     Effort: Pulmonary effort is normal. No respiratory distress.     Breath sounds: No wheezing.  Abdominal:     General: Bowel sounds are normal.     Palpations:  Abdomen is soft.     Tenderness: There is abdominal tenderness in the right lower quadrant, suprapubic area and left lower quadrant. There is no guarding or rebound.     Hernia: No hernia is present.  Musculoskeletal:     Cervical back: Neck supple.  Skin:    General: Skin is warm and dry.  Neurological:     General: No focal deficit present.     Mental Status: She is alert and  oriented to person, place, and time.  Psychiatric:        Mood and Affect: Mood is anxious.     ED Results / Procedures / Treatments   Labs (all labs ordered are listed, but only abnormal results are displayed) Labs Reviewed  CBC WITH DIFFERENTIAL/PLATELET - Abnormal; Notable for the following components:      Result Value   WBC 22.4 (*)    MCH 25.4 (*)    Platelets 423 (*)    Neutro Abs 20.9 (*)    Lymphs Abs 0.5 (*)    Abs Immature Granulocytes 0.15 (*)    All other components within normal limits  OCCULT BLOOD X 1 CARD TO LAB, STOOL - Abnormal; Notable for the following components:   Fecal Occult Bld POSITIVE (*)    All other components within normal limits  CULTURE, BLOOD (ROUTINE X 2)  CULTURE, BLOOD (ROUTINE X 2)  COMPREHENSIVE METABOLIC PANEL  URINALYSIS, ROUTINE W REFLEX MICROSCOPIC  LIPASE, BLOOD  LACTIC ACID, PLASMA  LACTIC ACID, PLASMA  POC OCCULT BLOOD, ED  TYPE AND SCREEN    EKG None  Radiology No results found.  Procedures Procedures (including critical care time)  CRITICAL CARE Performed by: Merryl Hacker   Total critical care time: 40 minutes  Critical care time was exclusive of separately billable procedures and treating other patients.  Critical care was necessary to treat or prevent imminent or life-threatening deterioration.  Critical care was time spent personally by me on the following activities: development of treatment plan with patient and/or surrogate as well as nursing, discussions with consultants, evaluation of patient's response to treatment,  examination of patient, obtaining history from patient or surrogate, ordering and performing treatments and interventions, ordering and review of laboratory studies, ordering and review of radiographic studies, pulse oximetry and re-evaluation of patient's condition.   Medications Ordered in ED Medications  metroNIDAZOLE (FLAGYL) IVPB 500 mg (has no administration in time range)  ceFEPIme (MAXIPIME) 1 g in sodium chloride 0.9 % 100 mL IVPB (has no administration in time range)  sodium chloride 0.9 % bolus 1,000 mL (has no administration in time range)  morphine 4 MG/ML injection 4 mg (4 mg Intravenous Given 10/08/19 0640)  ondansetron (ZOFRAN) injection 4 mg (4 mg Intravenous Given 10/08/19 4128)    ED Course  I have reviewed the triage vital signs and the nursing notes.  Pertinent labs & imaging results that were available during my care of the patient were reviewed by me and considered in my medical decision making (see chart for details).  Clinical Course as of Oct 08 710  [facility] Oct 08, 2019  0656 Patient has since had 2 large bloody bowel movements in the emergency room.  Given acute abdominal pain and bloody bowel movements, would be concern for colitis either infectious or ischemic.  Will cover with antibiotics and obtain a CTA.  I have reviewed her CT scan from just 10/01/2019.  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   [CH]    Clinical Course User Index [CH] Kory Rains, Barbette Hair, MD   MDM Rules/Calculators/A&P                           Patient presents with lower abdominal pain.  Acute in onset.  She reports nausea and vomiting.  Initially reported nonbloody emesis but subsequently had multiple bloody bowel movements while in the emergency department.  Pain acute in onset in the setting of GI bleeding is highly concerning for ischemic colitis.  Infectious colitis is also consideration.  She recently had a CT scan and does have  atherosclerotic disease of the aorta.  White count is 22.4.  Blood cultures added to work-up as well as lactate.  She is given fluids.  She was covered cefepime and Flagyl for infectious etiology pending CTA of the abdomen.  At time of signout, basic lab work otherwise still pending.  Final Clinical Impression(s) / ED Diagnoses Final diagnoses:  Lower abdominal pain  Bloody stools    Rx / DC Orders ED Discharge Orders    None       Merryl Hacker, MD 10/08/19 [phone]

## 2019-10-08 NOTE — ED Notes (Signed)
Patient transported to CT 

## 2019-10-08 NOTE — H&P (Signed)
[facility] History and Physical  [name] LNL:[id number] DOB: Oct 21, 1950 DOA: 10/08/2019   PCP: Mosie Lukes, MD  Specialists: Followed by gastroenterologist, Dr. Arelia Longest.  Also followed by Dr. Tomi Likens with neurology.  Chief Complaint: Abdominal pain  HPI: Danielle Fox is a 69 y.o. female with a past medical history of pyloric stenosis, C. difficile infection in August 2020, hyperlipidemia, history of COPD who was hospitalized recently for neurological work-up.  Apparently went to [place] in July and developed facial drooping and left-sided weakness.  Had to be admitted to a local hospital.  Initially diagnosed with a stroke but MRI did not confirm the stroke.  In any case patient came back to [facility] and then was admitted on July 29 due to recurrence of the facial drooping.  She was seen by neurology.  She underwent extensive work-up.  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  She is under the care of a neurologist.  She presented to the emergency department today with complaints of abdominal pain located in the lower abdomen.  It started overnight.  Patient informed me that she has been constipated for the last 7 days.  She took some laxatives yesterday.  And then had large mahogany colored stool this morning when she was in the emergency department.  She also had one episode of emesis this morning.  She is followed by gastroenterology and has had colonoscopy within the last 1 year.  She has also had upper endoscopy.  She has had pyloric stenosis which was dilated earlier this year.  She also was found to have sigmoid colon stricture as well as diverticulosis on the colonoscopy done last year.  Currently pain is well controlled at 3 out of 10.  Earlier it was a 8 out of 10 in intensity.  She denies any nausea vomiting currently.  No fever or chills.  Has not really has profuse diarrhea.  Patient underwent CT scan of her abdomen pelvis which revealed  colitis.  Patient was hospitalized for further management.  Home Medications: Prior to Admission medications   Medication Sig Start Date End Date Taking? Authorizing Provider  acetaminophen (TYLENOL) 325 MG tablet Take 650 mg by mouth every 6 (six) hours as needed for mild pain.   Yes [provider]  aspirin 81 MG EC tablet Take 1 tablet (81 mg total) by mouth in the morning and at bedtime. 09/18/19 10/19/19 Yes Mosie Lukes, MD  atorvastatin (LIPITOR) 40 MG tablet Take 1 tablet by mouth at bedtime. 09/16/19 10/17/19 Yes [provider]  budesonide-formoterol (SYMBICORT) 80-4.5 MCG/ACT inhaler Inhale 2 puffs into the lungs in the morning and at bedtime. Patient taking differently: Inhale 2 puffs into the lungs 2 (two) times daily as needed (wheezing, shortness of breath).  07/09/19  Yes Mosie Lukes, MD  clopidogrel (PLAVIX) 75 MG tablet Take 1 tablet (75 mg total) by mouth daily. 10/02/19 11/01/19 Yes Little Ishikawa, MD  esomeprazole (NEXIUM) 40 MG capsule Take 1 capsule (40 mg total) by mouth daily before breakfast. 03/16/19  Yes Gatha Mayer, MD  gabapentin (NEURONTIN) 100 MG capsule Take 3 capsules (300 mg total) by mouth 3 (three) times daily. 10/01/19 10/31/19 Yes Little Ishikawa, MD  levETIRAcetam (KEPPRA) 500 MG tablet Take 1 tablet (500 mg total) by mouth 2 (two) times daily. 10/02/19  Yes Jaffe, Adam R, DO  ondansetron (ZOFRAN) 4 MG tablet Take 1 tablet (4 mg total) by mouth every 8 (eight) hours as needed  for nausea or vomiting. 10/02/19  Yes Pieter Partridge, DO    Allergies:  Allergies  Allergen Reactions  . Doxycycline Nausea And Vomiting  . Erythromycin Base     Other reaction(s): GI Upset (intolerance)    Past Medical History: Past Medical History:  Diagnosis Date  . Allergy   . Clostridioides difficile infection 10/19/2018   tested 10/2018 negative  . Colon polyps   . Constipation 10/06/2014  . COPD (chronic obstructive pulmonary disease) ([place])   .  Osteopenia   . 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 Preventative health care 06/29/2016  . Pruritus 06/29/2016  . Pyloric stenosis in adult 05/2019  . Welcome to Medicare preventive visit 06/29/2016    Past Surgical History:  Procedure Laterality Date  . APPENDECTOMY    . COLONOSCOPY     01-14-2005  . SINUS IRRIGATION  04/02/2015  . UPPER GASTROINTESTINAL ENDOSCOPY  11/22/2018    Social History: She lives with her significant other.  Denies any smoking alcohol use or illicit drug use.  Family History:  Family History  Problem Relation Age of Onset  . Heart disease Mother        cabg, MI  . Alcohol abuse Father   . Cirrhosis Father   . Heart disease Father        MI  . Colon cancer Neg Hx   . Colon polyps Neg Hx   . Esophageal cancer Neg Hx   . Stomach cancer Neg Hx   . Rectal cancer Neg Hx      Review of Systems - History obtained from the patient General ROS: positive for  - fatigue Psychological ROS: positive for - anxiety Ophthalmic ROS: negative ENT ROS: negative Allergy and Immunology ROS: negative Hematological and Lymphatic ROS: negative Endocrine ROS: negative Respiratory ROS: no cough, shortness of breath, or wheezing Cardiovascular ROS: no chest pain or dyspnea on exertion Gastrointestinal ROS: As in HPI Genito-Urinary ROS: no dysuria, trouble voiding, or hematuria Musculoskeletal ROS: negative Neurological ROS: no TIA or stroke symptoms Dermatological ROS: negative  Physical Examination  Vitals:   10/08/19 1205 10/08/19 1226 10/08/19 1306 10/08/19 1419  BP: (!) 98/43  101/64 (!) 95/42  Pulse: 95 91 86 90  Resp:   18 18  Temp:   98.4 F (36.9 C) 99 F (37.2 C)  TempSrc:   Oral Oral  SpO2: 91% 93% 97% 95%  Weight:      Height:        BP (!) 95/42 (BP Location: Right Arm)   Pulse 90   Temp 99 F (37.2 C) (Oral)   Resp 18   Ht 5\' 1"  (1.549 m)   Wt 50.8 kg   SpO2 95%   BMI 21.16 kg/m   General appearance: alert, cooperative, appears  stated age and no distress Head: Normocephalic, without obvious abnormality, atraumatic Eyes: conjunctivae/corneas clear. PERRL, EOM's intact. Throat: lips, mucosa, and tongue normal; teeth and gums normal Neck: no adenopathy, no carotid bruit, no JVD, supple, symmetrical, trachea midline and thyroid not enlarged, symmetric, no tenderness/mass/nodules Resp: clear to auscultation bilaterally Cardio: regular rate and rhythm, S1, S2 normal, no murmur, click, rub or gallop GI: Abdomen soft.  Tender in the lower abdomen without any rebound rigidity or guarding.  No masses organomegaly.  Bowel sounds present normal. Extremities: extremities normal, atraumatic, no cyanosis or edema Pulses: 2+ and symmetric Skin: Skin color, texture, turgor normal. No rashes or lesions Lymph nodes: Cervical, supraclavicular, and axillary  nodes normal. Neurologic: No focal neurological deficits noted.    Labs on Admission: I have personally reviewed following labs and imaging studies  CBC: Recent Labs  Lab 10/08/19 0639  WBC 22.4*  NEUTROABS 20.9*  HGB 12.4  HCT 40.1  MCV 82.2  PLT 528*   Basic Metabolic Panel: Recent Labs  Lab 10/08/19 0639  NA 142  K 3.9  CL 105  CO2 23  GLUCOSE 150*  BUN 12  CREATININE 0.82  CALCIUM 9.3   GFR: Estimated Creatinine Clearance: 48.9 mL/min (by C-G formula based on SCr of 0.82 mg/dL). Liver Function Tests: Recent Labs  Lab 10/08/19 0639  AST 23  ALT 18  ALKPHOS 144*  BILITOT 0.2*  PROT 7.2  ALBUMIN 3.2*   Recent Labs  Lab 10/08/19 4132  LIPASE 21     Radiological Exams on Admission: CT Angio Abd/Pel W and/or Wo Contrast  Result Date: 10/08/2019 CLINICAL DATA:  Abdominal pain, nausea, vomiting, GI bleed and difficulty having bowel movements. EXAM: CT ANGIOGRAPHY ABDOMEN AND PELVIS WITH CONTRAST TECHNIQUE: Multidetector CT imaging of the abdomen and pelvis was performed using the standard protocol during bolus administration of intravenous  contrast. Multiplanar reconstructed images and MIPs were obtained and reviewed to evaluate the vascular anatomy. CONTRAST:  166mL OMNIPAQUE IOHEXOL 350 MG/ML SOLN COMPARISON:  CT of the abdomen and pelvis on 09/30/2019 FINDINGS: VASCULAR Aorta: Atherosclerosis of the abdominal aorta without evidence of aneurysm or dissection. Celiac: Normally patent. Normally patent branch vessels demonstrating normal branch vessel anatomy. SMA: Normally patent. Renals: Bilateral single renal arteries demonstrate normal patency. IMA: Normally patent. Inflow: Bilateral common iliac arteries demonstrate calcified plaque without evidence of stenosis or aneurysm. Atherosclerosis of bilateral internal iliac arteries. External iliac arteries demonstrate normal patency. Proximal Outflow: Normally patent bilateral common femoral arteries and femoral bifurcations. Veins: Venous phase imaging demonstrates normal patency of the portal vein, splenic vein, mesenteric veins, IVC, renal veins, iliac veins and common femoral veins. Review of the MIP images confirms the above findings. NON-VASCULAR Lower chest: No acute abnormality. Hepatobiliary: No focal liver abnormality is seen. No gallstones, gallbladder wall thickening, or biliary dilatation. Pancreas: Unremarkable. No pancreatic ductal dilatation or surrounding inflammatory changes. Spleen: Normal in size without focal abnormality. Adrenals/Urinary Tract: Adrenal glands are unremarkable. Kidneys are normal, without renal calculi, focal lesion, or hydronephrosis. Bladder is unremarkable. Stomach/Bowel: New thickening and inflammation of transverse and descending colon since the prior study 8 days ago with surrounding edema and a small amount of free fluid in the left pericolic gutter and dependent pelvis. Findings are consistent with colitis with the most significant thickening at the level of the distal transverse colon and splenic flexure. No evidence of associated colonic perforation or  focal abscess. The colon proximal to segment of colitis demonstrates some dilatation. There is no evidence of small-bowel obstruction. The sigmoid colon and rectum are decompressed. Lymphatic: No enlarged abdominal or pelvic lymph nodes. Reproductive: Again noted is a large cyst associated with the left adnexal region measuring up to 8.8 cm and demonstrating simple fluid density internally. No obvious solid enhancing tissue component by CT. However, further characterization with pelvic ultrasound is recommended as well as referral to gynecology as recommended previously as large postmenopausal cysts will need to be followed and further characterized. Other: No hernias identified. Musculoskeletal: No acute findings. Stable anterolisthesis of L4 on L5. IMPRESSION: 1. No evidence of significant mesenteric arterial occlusive disease. 2. New thickening and inflammation of the transverse and descending colon since the prior study 8  days ago with surrounding edema and a small amount of free fluid in the left pericolic gutter and dependent pelvis. Findings are consistent with acute colitis. No evidence of associated colonic perforation or focal abscess. The colon proximal to the segment of colitis demonstrates some dilatation. 3. Stable large cyst associated with the left adnexal region measuring up to 8.8 cm and demonstrating simple fluid density internally. However, further characterization with pelvic ultrasound is recommended as well as referral to gynecology as previously recommended, as a large postmenopausal adnexal cyst will need to be followed and further characterized. Electronically Signed   By: Aletta Edouard M.D.   On: 10/08/2019 08:22       Problem List  Principal Problem:   Acute colitis Active Problems:   Adnexal mass   Assessment: This is a 69 year old Caucasian female with past medical history as stated earlier who comes in with abdominal pain.  CT scan suggested acute colitis.  WBC at 22.4  raising concern for infectious etiology.  Plan: 1. Acute colitis with possible hematochezia: She mentioned mahogany colored stool this morning.  Has not had any further episodes of bleeding.  CT scan did show colitis.  She has a history of C. difficile previously but has not had profuse diarrhea.  She actually was constipated.  Stool studies have been ordered.  She has been started on ceftriaxone and metronidazole.  Gastroenterology has been consulted.    2. 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  Outpatient follow-up with neurology.  Due to concern for GI bleed we are holding her aspirin and Plavix.  These can be resumed tomorrow if hemoglobin remains stable and if she does not have any further episodes of hematochezia.    She is also noted to be on Keppra for seizure prophylaxis which will be continued.  3.  Cyst in the left adnexal region: Will need further evaluation which can be done in the outpatient setting.  Patient will benefit from referral to gynecology.   DVT Prophylaxis: SCDs Code Status: Full code Family Communication: Discussed with the patient Disposition: Hopefully return home when improved Consults called: Gastroenterology Admission Status: Status is: Inpatient  Remains inpatient appropriate because:Ongoing active pain requiring inpatient pain management and IV treatments appropriate due to intensity of illness or inability to take PO   Dispo: The patient is from: Home              Anticipated d/c is to: Home              Anticipated d/c date is: 2 days              Patient currently is not medically stable to d/c.   Severity of Illness: The appropriate patient status for this patient is INPATIENT. Inpatient status is judged to be reasonable and necessary in order to provide the required intensity of service to ensure the patient's safety. The patient's presenting symptoms, physical exam findings, and initial radiographic and  laboratory data in the context of their chronic comorbidities is felt to place them at high risk for further clinical deterioration. Furthermore, it is not anticipated that the patient will be medically stable for discharge from the hospital within 2 midnights of admission. The following factors support the patient status of inpatient.   " The patient's presenting symptoms include abdominal pain. " The worrisome physical exam findings include abdominal tenderness. " The initial radiographic and laboratory data are worrisome because of acute colitis. " The chronic co-morbidities include COPD.   *  I certify that at the point of admission it is my clinical judgment that the patient will require inpatient hospital care spanning beyond 2 midnights from the point of admission due to high intensity of service, high risk for further deterioration and high frequency of surveillance required.*  Further management decisions will depend on results of further testing and patient's response to treatment.   Kaisey Huseby [employer]  [facility] operational officer on [email or web address]  10/08/2019, 5:47 PM

## 2019-10-09 DIAGNOSIS — K559 Vascular disorder of intestine, unspecified: Secondary | ICD-10-CM

## 2019-10-09 LAB — C DIFFICILE QUICK SCREEN W PCR REFLEX
C Diff antigen: NEGATIVE
C Diff interpretation: NOT DETECTED
C Diff toxin: NEGATIVE

## 2019-10-09 LAB — CBC
HCT: 29.5 % — ABNORMAL LOW (ref 36.0–46.0)
HCT: 31.2 % — ABNORMAL LOW (ref 36.0–46.0)
Hemoglobin: 9.2 g/dL — ABNORMAL LOW (ref 12.0–15.0)
Hemoglobin: 9.8 g/dL — ABNORMAL LOW (ref 12.0–15.0)
MCH: 26.1 pg (ref 26.0–34.0)
MCH: 25.9 pg — ABNORMAL LOW (ref 26.0–34.0)
MCHC: 31.2 g/dL (ref 30.0–36.0)
MCHC: 31.4 g/dL (ref 30.0–36.0)
MCV: 83.6 fL (ref 80.0–100.0)
MCV: 82.5 fL (ref 80.0–100.0)
Platelets: 339 10*3/uL (ref 150–400)
Platelets: 367 10*3/uL (ref 150–400)
RBC: 3.53 MIL/uL — ABNORMAL LOW (ref 3.87–5.11)
RBC: 3.78 MIL/uL — ABNORMAL LOW (ref 3.87–5.11)
RDW: 15 % (ref 11.5–15.5)
RDW: 14.8 % (ref 11.5–15.5)
WBC: 17.8 10*3/uL — ABNORMAL HIGH (ref 4.0–10.5)
WBC: 20.6 10*3/uL — ABNORMAL HIGH (ref 4.0–10.5)
nRBC: 0 % (ref 0.0–0.2)
nRBC: 0 % (ref 0.0–0.2)

## 2019-10-09 LAB — COMPREHENSIVE METABOLIC PANEL
ALT: 13 U/L (ref 0–44)
AST: 14 U/L — ABNORMAL LOW (ref 15–41)
Albumin: 2.2 g/dL — ABNORMAL LOW (ref 3.5–5.0)
Alkaline Phosphatase: 109 U/L (ref 38–126)
Anion gap: 9 (ref 5–15)
BUN: 12 mg/dL (ref 8–23)
CO2: 22 mmol/L (ref 22–32)
Calcium: 7.9 mg/dL — ABNORMAL LOW (ref 8.9–10.3)
Chloride: 108 mmol/L (ref 98–111)
Creatinine, Ser: 0.7 mg/dL (ref 0.44–1.00)
GFR calc Af Amer: >60 mL/min (ref >60)
GFR calc non Af Amer: >60 mL/min (ref >60)
Glucose, Bld: 107 mg/dL — ABNORMAL HIGH (ref 70–99)
Potassium: 3.4 mmol/L — ABNORMAL LOW (ref 3.5–5.1)
Sodium: 139 mmol/L (ref 135–145)
Total Bilirubin: 0.4 mg/dL (ref 0.3–1.2)
Total Protein: 5.4 g/dL — ABNORMAL LOW (ref 6.5–8.1)

## 2019-10-09 LAB — GASTROINTESTINAL PANEL BY PCR, STOOL (REPLACES STOOL CULTURE)

## 2019-10-09 LAB — GAMMA GT: GGT: 15 U/L (ref 7–50)

## 2019-10-09 MED ORDER — DICYCLOMINE HCL 10 MG PO CAPS
10.0000 mg | ORAL_CAPSULE | Freq: Three times a day (TID) | ORAL | Status: DC
  Administered 2019-10-09: 10 mg via ORAL
  Filled 2019-10-09: qty 1

## 2019-10-09 MED ORDER — VANCOMYCIN 50 MG/ML ORAL SOLUTION
125.0000 mg | Freq: Four times a day (QID) | ORAL | Status: DC
  Administered 2019-10-09 – 2019-10-12 (×11): 125 mg via ORAL
  Filled 2019-10-09 (×12): qty 2.5

## 2019-10-09 MED ORDER — CIPROFLOXACIN IN D5W 400 MG/200ML IV SOLN
400.0000 mg | Freq: Two times a day (BID) | INTRAVENOUS | Status: DC
  Administered 2019-10-09 – 2019-10-12 (×6): 400 mg via INTRAVENOUS
  Filled 2019-10-09 (×6): qty 200

## 2019-10-09 MED ORDER — PROCHLORPERAZINE EDISYLATE 10 MG/2ML IJ SOLN
10.0000 mg | Freq: Three times a day (TID) | INTRAMUSCULAR | Status: DC | PRN
  Administered 2019-10-09: 10 mg via INTRAVENOUS
  Filled 2019-10-09: qty 2

## 2019-10-09 MED ORDER — SACCHAROMYCES BOULARDII 250 MG PO CAPS
250.0000 mg | ORAL_CAPSULE | Freq: Two times a day (BID) | ORAL | Status: DC
  Administered 2019-10-09 – 2019-10-12 (×6): 250 mg via ORAL
  Filled 2019-10-09 (×6): qty 1

## 2019-10-09 MED ORDER — DICYCLOMINE HCL 10 MG PO CAPS
10.0000 mg | ORAL_CAPSULE | Freq: Three times a day (TID) | ORAL | Status: DC
  Administered 2019-10-09 – 2019-10-12 (×11): 10 mg via ORAL
  Filled 2019-10-09 (×11): qty 1

## 2019-10-09 MED ORDER — POTASSIUM CHLORIDE IN NACL 40-0.9 MEQ/L-% IV SOLN
INTRAVENOUS | Status: DC
  Filled 2019-10-09 (×5): qty 1000

## 2019-10-09 MED ORDER — BOOST / RESOURCE BREEZE PO LIQD CUSTOM
1.0000 | Freq: Three times a day (TID) | ORAL | Status: DC
  Administered 2019-10-09: 1 via ORAL

## 2019-10-09 NOTE — Progress Notes (Signed)
Progress Note  CC:   GI bleed       ASSESSMENT AND PLAN:    69 yo female with pmh significant for but not limited to osteopenia, pre-diabetes, questionable stroke 09/15/2019, COPD, positive TB skin test, pyloric stenosis, C. difficile 09/2018, colonic stricture   # Hematochezia on Plavix / colitis of transverse and splenic flexure on CT scan --Already had three episodes of bloody diarrhea today.  --C-diff study  / GI pathogen panel not resulted. Patient says she just submitted a sample.   --Suspect this is ischemic colitis but need to exclude infectious --Plavix on hold --WBC improved from 22 to 17.8.  --Hgb has declined significantly from 12.4 to 9.2., monitor for now --check am CBC  --Doesn't want anymore oxycodone. Will change bentyl to scheduled dosing  #  8.8 cm left ovarian mass/cyst -Recommend gyn consult to assess ovarian mass  # Elevated Alk phos, resolved.     SUBJECTIVE   Has had three episodes of bloody diarrhea thus far this am. Abdominal cramps a little better. Doesn't want anymore oxycodone. Tolerating clears   OBJECTIVE:     Vital signs in last 24 hours: Temp:  [98.1 F (36.7 C)-99.1 F (37.3 C)] 98.6 F (37 C) (08/10 0656) Pulse Rate:  [86-95] 92 (08/10 0656) Resp:  [14-19] 19 (08/10 0656) BP: (94-109)/(42-64) 109/59 (08/10 0656) SpO2:  [91 %-97 %] 97 % (08/10 0656) Last BM Date: 10/08/19 General:   Alert, in NAD Heart:  Regular rate and rhythm.  No lower extremity edema   Pulm: Normal respiratory effort   Abdomen:  Soft, mild lower abdominal tenderness, nondistended.  A few bowel sounds. .          Neurologic:  Alert and  oriented,  grossly normal neurologically. Psych:  Pleasant, cooperative.  Normal mood and affect.   Intake/Output from previous day: 08/09 0701 - 08/10 0700 In: 1312.5 [P.O.:240; I.V.:1.7; IV Piggyback:1070.8] Out: -  Intake/Output this shift: No intake/output data recorded.  Lab Results: Recent Labs     10/08/19 0639 10/09/19 0428  WBC 22.4* 17.8*  HGB 12.4 9.2*  HCT 40.1 29.5*  PLT 423* 339   BMET Recent Labs    10/08/19 0639 10/09/19 0428  NA 142 139  K 3.9 3.4*  CL 105 108  CO2 23 22  GLUCOSE 150* 107*  BUN 12 12  CREATININE 0.82 0.70  CALCIUM 9.3 7.9*   LFT Recent Labs    10/09/19 0428  PROT 5.4*  ALBUMIN 2.2*  AST 14*  ALT 13  ALKPHOS 109  BILITOT 0.4   PT/INR No results for input(s): LABPROT, INR in the last 72 hours. Hepatitis Panel No results for input(s): HEPBSAG, HCVAB, HEPAIGM, HEPBIGM in the last 72 hours.  CT Angio Abd/Pel W and/or Wo Contrast  Result Date: 10/08/2019 CLINICAL DATA:  Abdominal pain, nausea, vomiting, GI bleed and difficulty having bowel movements. EXAM: CT ANGIOGRAPHY ABDOMEN AND PELVIS WITH CONTRAST TECHNIQUE: Multidetector CT imaging of the abdomen and pelvis was performed using the standard protocol during bolus administration of intravenous contrast. Multiplanar reconstructed images and MIPs were obtained and reviewed to evaluate the vascular anatomy. CONTRAST:  142m OMNIPAQUE IOHEXOL 350 MG/ML SOLN COMPARISON:  CT of the abdomen and pelvis on 09/30/2019 FINDINGS: VASCULAR Aorta: Atherosclerosis of the abdominal aorta without evidence of aneurysm or dissection. Celiac: Normally patent. Normally patent branch vessels demonstrating normal branch vessel anatomy. SMA: Normally patent. Renals: Bilateral single renal arteries demonstrate normal patency. IMA: Normally patent. Inflow:  Bilateral common iliac arteries demonstrate calcified plaque without evidence of stenosis or aneurysm. Atherosclerosis of bilateral internal iliac arteries. External iliac arteries demonstrate normal patency. Proximal Outflow: Normally patent bilateral common femoral arteries and femoral bifurcations. Veins: Venous phase imaging demonstrates normal patency of the portal vein, splenic vein, mesenteric veins, IVC, renal veins, iliac veins and common femoral veins. Review  of the MIP images confirms the above findings. NON-VASCULAR Lower chest: No acute abnormality. Hepatobiliary: No focal liver abnormality is seen. No gallstones, gallbladder wall thickening, or biliary dilatation. Pancreas: Unremarkable. No pancreatic ductal dilatation or surrounding inflammatory changes. Spleen: Normal in size without focal abnormality. Adrenals/Urinary Tract: Adrenal glands are unremarkable. Kidneys are normal, without renal calculi, focal lesion, or hydronephrosis. Bladder is unremarkable. Stomach/Bowel: New thickening and inflammation of transverse and descending colon since the prior study 8 days ago with surrounding edema and a small amount of free fluid in the left pericolic gutter and dependent pelvis. Findings are consistent with colitis with the most significant thickening at the level of the distal transverse colon and splenic flexure. No evidence of associated colonic perforation or focal abscess. The colon proximal to segment of colitis demonstrates some dilatation. There is no evidence of small-bowel obstruction. The sigmoid colon and rectum are decompressed. Lymphatic: No enlarged abdominal or pelvic lymph nodes. Reproductive: Again noted is a large cyst associated with the left adnexal region measuring up to 8.8 cm and demonstrating simple fluid density internally. No obvious solid enhancing tissue component by CT. However, further characterization with pelvic ultrasound is recommended as well as referral to gynecology as recommended previously as large postmenopausal cysts will need to be followed and further characterized. Other: No hernias identified. Musculoskeletal: No acute findings. Stable anterolisthesis of L4 on L5. IMPRESSION: 1. No evidence of significant mesenteric arterial occlusive disease. 2. New thickening and inflammation of the transverse and descending colon since the prior study 8 days ago with surrounding edema and a small amount of free fluid in the left  pericolic gutter and dependent pelvis. Findings are consistent with acute colitis. No evidence of associated colonic perforation or focal abscess. The colon proximal to the segment of colitis demonstrates some dilatation. 3. Stable large cyst associated with the left adnexal region measuring up to 8.8 cm and demonstrating simple fluid density internally. However, further characterization with pelvic ultrasound is recommended as well as referral to gynecology as previously recommended, as a large postmenopausal adnexal cyst will need to be followed and further characterized. Electronically Signed   By: Aletta Edouard M.D.   On: 10/08/2019 08:22       Principal Problem:   Acute colitis Active Problems:   Adnexal mass   Bloody stools   Lower abdominal pain   Abnormal CT scan, colon     LOS: 1 day   Tye Savoy ,NP 10/09/2019, 9:29 AM

## 2019-10-09 NOTE — Progress Notes (Addendum)
PROGRESS NOTE    Danielle Fox   IZT:[id number]  DOB: [birth year]  DOA: 10/08/2019 PCP: Mosie Lukes, MD   Brief Narrative:  Danielle Fox is a 69 y.o. female with a past medical history of pyloric stenosis, C. difficile infection in August 2020, hyperlipidemia, history of COPD who presents to the hospital with bloody diarrhea after using laxatives at home.  In addition she recalls eating out the night before this episode occurred. CT scan revealed: inflammation of the transverse and descending colon    Subjective: Patient has had multiple episodes of stool mixed with blood today.  She has not had any lightheadedness.  No nausea or vomiting.  Abdominal pain is crampy.  Assessment & Plan:   Principal Problem:   Acute colitis-lower GI bleed-mild hypokalemia due to frequent stools Leukocytosis -GI suspects ischemic colitis-follow hemoglobin twice daily and transfuse as needed-the patient is agreeable to transfusion if needed -She does have a history of C. difficile colitis studies for C. difficile are negative in the hospital -She does admit to eating out prior to the onset of this episode-question bacterial etiology -Continue IV fluids with potassium - check be met in the morning -Out of bed with assistance only due to GI bleeding-she has a bedside commode -Leukocytosis is either a stress response to her current colitis or a sign of underlying infection -He received cefepime and Flagyl in the ED-at this time she is not on antibiotics -Follow GI panel -Plavix is on hold -Appreciate GI consult  Addendum> Enterotoxigenic e coli on Gi pathogen panel- ID, Dr Megan Salon has recommended Cipro for 3-5 days and 1 wk of oral Vanc to prevent recurrence of C diff. GI recommends Florastor which I have added.    Active Problems:   Adnexal mass -We will need outpatient follow-up of this with an ultrasound  COPD -No signs of any exacerbation  Time spent in minutes: 35 DVT prophylaxis:  SCDs Start: 10/08/19 1652    Code Status: Full code Family Communication: None Disposition Plan:  Status is: Inpatient  Remains inpatient appropriate because:Treatment for colitis   Dispo: The patient is from: Home              Anticipated d/c is to: Home              Anticipated d/c date is: 2 days              Patient currently is not medically stable to d/c.      Consultants:   GI Procedures:    Antimicrobials:  Anti-infectives (From admission, onward)   Start     Dose/Rate Route Frequency Ordered Stop   10/08/19 2100  cefTRIAXone (ROCEPHIN) 2 g in sodium chloride 0.9 % 100 mL IVPB  Status:  Discontinued        2 g 200 mL/hr over 30 Minutes Intravenous Every 24 hours 10/08/19 1652 10/08/19 1856   10/08/19 1700  metroNIDAZOLE (FLAGYL) IVPB 500 mg  Status:  Discontinued        500 mg 100 mL/hr over 60 Minutes Intravenous Every 8 hours 10/08/19 1652 10/08/19 1856   10/08/19 0800  ceFEPIme (MAXIPIME) 2 g in sodium chloride 0.9 % 100 mL IVPB        2 g 200 mL/hr over 30 Minutes Intravenous  Once 10/08/19 0751 10/08/19 0949   10/08/19 0715  metroNIDAZOLE (FLAGYL) IVPB 500 mg        500 mg 100 mL/hr over 60 Minutes Intravenous  Once 10/08/19  3086 10/08/19 0904   10/08/19 0715  ceFEPIme (MAXIPIME) 1 g in sodium chloride 0.9 % 100 mL IVPB  Status:  Discontinued        1 g 200 mL/hr over 30 Minutes Intravenous  Once 10/08/19 0702 10/08/19 0751       Objective: Vitals:   10/08/19 1419 10/08/19 2208 10/09/19 0124 10/09/19 0656  BP: (!) 95/42 (!) 94/51 (!) 104/53 (!) 109/59  Pulse: 90 92 92 92  Resp: '18 14 16 19  ' Temp: 99 F (37.2 C) 98.1 F (36.7 C) 99.1 F (37.3 C) 98.6 F (37 C)  TempSrc: Oral Oral Oral Oral  SpO2: 95% 95% 97% 97%  Weight:      Height:        Intake/Output Summary (Last 24 hours) at 10/09/2019 1126 Last data filed at 10/09/2019 1000 Gross per 24 hour  Intake 852.54 ml  Output --  Net 852.54 ml   Filed Weights   10/08/19 0536  Weight:  50.8 kg    Examination: General exam: Appears comfortable  HEENT: PERRLA, oral mucosa moist, no sclera icterus or thrush Respiratory system: Clear to auscultation. Respiratory effort normal. Cardiovascular system: S1 & S2 heard, RRR.   Gastrointestinal system: Abdomen soft, non-tender, nondistended. Normal bowel sounds. Central nervous system: Alert and oriented. No focal neurological deficits. Extremities: No cyanosis, clubbing or edema Skin: No rashes or ulcers Psychiatry:  Mood & affect appropriate.     Data Reviewed: I have personally reviewed following labs and imaging studies  CBC: Recent Labs  Lab 10/08/19 0639 10/09/19 0428  WBC 22.4* 17.8*  NEUTROABS 20.9*  --   HGB 12.4 9.2*  HCT 40.1 29.5*  MCV 82.2 83.6  PLT 423* 578   Basic Metabolic Panel: Recent Labs  Lab 10/08/19 0639 10/09/19 0428  NA 142 139  K 3.9 3.4*  CL 105 108  CO2 23 22  GLUCOSE 150* 107*  BUN 12 12  CREATININE 0.82 0.70  CALCIUM 9.3 7.9*   GFR: Estimated Creatinine Clearance: 50.1 mL/min (by C-G formula based on SCr of 0.7 mg/dL). Liver Function Tests: Recent Labs  Lab 10/08/19 0639 10/09/19 0428  AST 23 14*  ALT 18 13  ALKPHOS 144* 109  BILITOT 0.2* 0.4  PROT 7.2 5.4*  ALBUMIN 3.2* 2.2*   Recent Labs  Lab 10/08/19 0639  LIPASE 21   No results for input(s): AMMONIA in the last 168 hours. Coagulation Profile: No results for input(s): INR, PROTIME in the last 168 hours. Cardiac Enzymes: No results for input(s): CKTOTAL, CKMB, CKMBINDEX, TROPONINI in the last 168 hours. BNP (last 3 results) No results for input(s): PROBNP in the last 8760 hours. HbA1C: No results for input(s): HGBA1C in the last 72 hours. CBG: No results for input(s): GLUCAP in the last 168 hours. Lipid Profile: No results for input(s): CHOL, HDL, LDLCALC, TRIG, CHOLHDL, LDLDIRECT in the last 72 hours. Thyroid Function Tests: No results for input(s): TSH, T4TOTAL, FREET4, T3FREE, THYROIDAB in the last  72 hours. Anemia Panel: No results for input(s): VITAMINB12, FOLATE, FERRITIN, TIBC, IRON, RETICCTPCT in the last 72 hours. Urine analysis:    Component Value Date/Time   COLORURINE YELLOW 09/27/2019 1800   APPEARANCEUR CLEAR 09/27/2019 1800   LABSPEC >1.046 (H) 09/27/2019 1800   PHURINE 6.0 09/27/2019 1800   GLUCOSEU NEGATIVE 09/27/2019 1800   GLUCOSEU NEGATIVE 09/18/2019 1626   HGBUR NEGATIVE 09/27/2019 1800   BILIRUBINUR NEGATIVE 09/27/2019 1800   BILIRUBINUR negative 05/15/2019 1105   KETONESUR 20 (A) 09/27/2019  [place] 09/27/2019 1800   UROBILINOGEN 0.2 09/18/2019 1626   NITRITE NEGATIVE 09/27/2019 1800   LEUKOCYTESUR TRACE (A) 09/27/2019 1800   Sepsis Labs: '@LABRCNTIP' (procalcitonin:4,lacticidven:4) ) Recent Results (from the past 240 hour(s))  Blood culture (routine x 2)     Status: None (Preliminary result)   Collection Time: 10/08/19  7:29 AM   Specimen: Left Antecubital; Blood  Result Value Ref Range Status   Specimen Description   Final    LEFT ANTECUBITAL Performed at [facility], [address] [place], [place] [address]    Special Requests   Final    BOTTLES DRAWN AEROBIC AND ANAEROBIC Blood Culture results may not be optimal due to an inadequate volume of blood received in culture bottles Performed at [facility], [address] [place], [place] [address]    Culture   Final    NO GROWTH 1 DAY Performed at [facility], [place] [address], [place], [place] [address]    Report Status PENDING  Incomplete  SARS Coronavirus 2 by RT PCR (hospital order, performed in [facility] hospital lab) Nasopharyngeal Nasopharyngeal Swab     Status: None   Collection Time: 10/08/19  7:30 AM   Specimen: Nasopharyngeal Swab  Result Value Ref Range Status   SARS Coronavirus 2 NEGATIVE NEGATIVE Final    Comment: (NOTE) SARS-CoV-2 target nucleic acids are NOT DETECTED.  The SARS-CoV-2 RNA is generally detectable in upper and  lower respiratory specimens during the acute phase of infection. The lowest concentration of SARS-CoV-2 viral copies this assay can detect is 250 copies / mL. A negative result does not preclude SARS-CoV-2 infection and should not be used as the sole basis for treatment or other patient management decisions.  A negative result may occur with improper specimen collection / handling, submission of specimen other than nasopharyngeal swab, presence of viral mutation(s) within the areas targeted by this assay, and inadequate number of viral copies (<250 copies / mL). A negative result must be combined with clinical observations, patient history, and epidemiological information.  Fact Sheet for Patients:   StrictlyIdeas.no  Fact Sheet for Healthcare Providers: BankingDealers.co.za  This test is not yet approved or  cleared by the [place] FDA and has been authorized for detection and/or diagnosis of SARS-CoV-2 by FDA under an Emergency Use Authorization (EUA).  This EUA will remain in effect (meaning this test can be used) for the duration of the COVID-19 declaration under Section 564(b)(1) of the Act, 21 U.S.C. section 360bbb-3(b)(1), unless the authorization is terminated or revoked sooner.  Performed at [facility], [address] [place], [place] [address]   Blood culture (routine x 2)     Status: None (Preliminary result)   Collection Time: 10/08/19  8:04 AM   Specimen: BLOOD LEFT FOREARM  Result Value Ref Range Status   Specimen Description   Final    BLOOD LEFT FOREARM Performed at [facility], [address] [place], [place] [address]    Special Requests   Final    BOTTLES DRAWN AEROBIC AND ANAEROBIC Blood Culture results may not be optimal due to an inadequate volume of blood received in culture bottles Performed at [facility], [address] [place], [place] [address]    Culture    Final    NO GROWTH 1 DAY Performed at [facility], [place] [address] [place], [place] [address]  Report Status PENDING  Incomplete  C Difficile Quick Screen w PCR reflex     Status: None   Collection Time: 10/09/19  9:52 AM  Result Value Ref Range Status   C Diff antigen NEGATIVE NEGATIVE Final   C Diff toxin NEGATIVE NEGATIVE Final   C Diff interpretation No C. difficile detected.  Final    Comment: Performed at [facility], [place] [address], [place], [place] [address]         Radiology Studies: CT Angio Abd/Pel W and/or Wo Contrast  Result Date: 10/08/2019 CLINICAL DATA:  Abdominal pain, nausea, vomiting, GI bleed and difficulty having bowel movements. EXAM: CT ANGIOGRAPHY ABDOMEN AND PELVIS WITH CONTRAST TECHNIQUE: Multidetector CT imaging of the abdomen and pelvis was performed using the standard protocol during bolus administration of intravenous contrast. Multiplanar reconstructed images and MIPs were obtained and reviewed to evaluate the vascular anatomy. CONTRAST:  161m OMNIPAQUE IOHEXOL 350 MG/ML SOLN COMPARISON:  CT of the abdomen and pelvis on 09/30/2019 FINDINGS: VASCULAR Aorta: Atherosclerosis of the abdominal aorta without evidence of aneurysm or dissection. Celiac: Normally patent. Normally patent branch vessels demonstrating normal branch vessel anatomy. SMA: Normally patent. Renals: Bilateral single renal arteries demonstrate normal patency. IMA: Normally patent. Inflow: Bilateral common iliac arteries demonstrate calcified plaque without evidence of stenosis or aneurysm. Atherosclerosis of bilateral internal iliac arteries. External iliac arteries demonstrate normal patency. Proximal Outflow: Normally patent bilateral common femoral arteries and femoral bifurcations. Veins: Venous phase imaging demonstrates normal patency of the portal vein, splenic vein, mesenteric veins, IVC, renal veins, iliac veins and common femoral veins. Review of the MIP images  confirms the above findings. NON-VASCULAR Lower chest: No acute abnormality. Hepatobiliary: No focal liver abnormality is seen. No gallstones, gallbladder wall thickening, or biliary dilatation. Pancreas: Unremarkable. No pancreatic ductal dilatation or surrounding inflammatory changes. Spleen: Normal in size without focal abnormality. Adrenals/Urinary Tract: Adrenal glands are unremarkable. Kidneys are normal, without renal calculi, focal lesion, or hydronephrosis. Bladder is unremarkable. Stomach/Bowel: New thickening and inflammation of transverse and descending colon since the prior study 8 days ago with surrounding edema and a small amount of free fluid in the left pericolic gutter and dependent pelvis. Findings are consistent with colitis with the most significant thickening at the level of the distal transverse colon and splenic flexure. No evidence of associated colonic perforation or focal abscess. The colon proximal to segment of colitis demonstrates some dilatation. There is no evidence of small-bowel obstruction. The sigmoid colon and rectum are decompressed. Lymphatic: No enlarged abdominal or pelvic lymph nodes. Reproductive: Again noted is a large cyst associated with the left adnexal region measuring up to 8.8 cm and demonstrating simple fluid density internally. No obvious solid enhancing tissue component by CT. However, further characterization with pelvic ultrasound is recommended as well as referral to gynecology as recommended previously as large postmenopausal cysts will need to be followed and further characterized. Other: No hernias identified. Musculoskeletal: No acute findings. Stable anterolisthesis of L4 on L5. IMPRESSION: 1. No evidence of significant mesenteric arterial occlusive disease. 2. New thickening and inflammation of the transverse and descending colon since the prior study 8 days ago with surrounding edema and a small amount of free fluid in the left pericolic gutter and  dependent pelvis. Findings are consistent with acute colitis. No evidence of associated colonic perforation or focal abscess. The colon proximal to the segment of colitis demonstrates some dilatation. 3. Stable large cyst associated with the left adnexal region measuring up to 8.8  cm and demonstrating simple fluid density internally. However, further characterization with pelvic ultrasound is recommended as well as referral to gynecology as previously recommended, as a large postmenopausal adnexal cyst will need to be followed and further characterized. Electronically Signed   By: Aletta Edouard M.D.   On: 10/08/2019 08:22      Scheduled Meds: . dicyclomine  10 mg Oral TID AC   Continuous Infusions: . 0.9 % NaCl with KCl 40 mEq / L       LOS: 1 day      Debbe Odea, MD [facility] Pager: [email or web address] 10/09/2019, 11:26 AM

## 2019-10-09 NOTE — Progress Notes (Signed)
Initial Nutrition Assessment  INTERVENTION:   -Boost Breeze po TID, each supplement provides 250 kcal and 9 grams of protein  NUTRITION DIAGNOSIS:   Inadequate oral intake related to decreased appetite, constipation as evidenced by per patient/family report.  GOAL:   Patient will meet greater than or equal to 90% of their needs  MONITOR:   PO intake, Supplement acceptance, Labs, Weight trends, I & O's  REASON FOR ASSESSMENT:   Malnutrition Screening Tool    ASSESSMENT:   69 y.o. female with a past medical history of osteopenia, pre-diabetes, questionable stroke 09/15/2019, COPD, positive TB skin test, pyloric stenosis, C. difficile 09/2018 and hyperplastic colon polyp  Unable to speak with pt at this time.  Per chart review, pt had N/V and abdominal pain PTA. Pt was constipated for ~7 days. Pt now having BMs but they are loose and bloody.  Pt on clear liquids. Will order Boost Breeze supplements.  Per weight records, pt has lost 14 lbs since 3/16 (11% wt loss x 5 months, significant for time frame).   Medications reviewed. Labs reviewed: Low K  NUTRITION - FOCUSED PHYSICAL EXAM:  Unable to complete  Diet Order:   Diet Order            Diet clear liquid Room service appropriate? Yes; Fluid consistency: Thin  Diet effective now                 EDUCATION NEEDS:   No education needs have been identified at this time  Skin:  Skin Assessment: Reviewed RN Assessment  Last BM:  8/10 -bloody  Height:   Ht Readings from Last 1 Encounters:  10/08/19 5\' 1"  (1.549 m)    Weight:   Wt Readings from Last 1 Encounters:  10/08/19 50.8 kg    BMI:  Body mass index is 21.16 kg/m.  Estimated Nutritional Needs:   Kcal:  1550-1750  Protein:  65-75g  Fluid:  1.5L/day  Clayton Bibles, MS, RD, LDN Inpatient Clinical Dietitian Contact information available via Amion

## 2019-10-10 LAB — BASIC METABOLIC PANEL
Anion gap: 8 (ref 5–15)
BUN: 7 mg/dL — ABNORMAL LOW (ref 8–23)
CO2: 22 mmol/L (ref 22–32)
Calcium: 8 mg/dL — ABNORMAL LOW (ref 8.9–10.3)
Chloride: 108 mmol/L (ref 98–111)
Creatinine, Ser: 0.67 mg/dL (ref 0.44–1.00)
GFR calc Af Amer: >60 mL/min (ref >60)
GFR calc non Af Amer: >60 mL/min (ref >60)
Glucose, Bld: 103 mg/dL — ABNORMAL HIGH (ref 70–99)
Potassium: 4.2 mmol/L (ref 3.5–5.1)
Sodium: 138 mmol/L (ref 135–145)

## 2019-10-10 LAB — CBC
HCT: 26.6 % — ABNORMAL LOW (ref 36.0–46.0)
Hemoglobin: 8.4 g/dL — ABNORMAL LOW (ref 12.0–15.0)
MCH: 26.3 pg (ref 26.0–34.0)
MCHC: 31.6 g/dL (ref 30.0–36.0)
MCV: 83.1 fL (ref 80.0–100.0)
Platelets: 300 10*3/uL (ref 150–400)
RBC: 3.2 MIL/uL — ABNORMAL LOW (ref 3.87–5.11)
RDW: 14.9 % (ref 11.5–15.5)
WBC: 16.1 10*3/uL — ABNORMAL HIGH (ref 4.0–10.5)
nRBC: 0 % (ref 0.0–0.2)

## 2019-10-10 NOTE — Progress Notes (Signed)
     Progress Note  CC:    Bloody diarrhea      ASSESSMENT AND PLAN:   69 yo female with pmh significant for but not limited to osteopenia, pre-diabetes,questionable stroke7/17/2021,COPD,positive TB skin test,pyloricstenosis, C. difficile 09/2018, colonic stricture   # Hematochezia  / colitis on CT scan. Presentation suspicious for ischemic colitis though stool studies + for Enteropathogenic E.coli.  --Frequency of bloody diarrhea has improved. Only one episode thus far today and her abdominal pain has improved.  --Stool studies + Enteropathogenic E.Coli. C-diff negative.    --Still concerned about underlying ischemic colitis.  --WBC continues to improve 20.6 to 16. .  --Hgb has declined significantly from 12.4 to 9.2 to 8.4. Monitor for now. Bloody diarrhea is slowly so hopefully hgb will stabilize.  --Continue Cipro. Also oral Vancomycin was started for C-diff prophylaxis ( has history of C-diff).  --Continue Florastor --Continue Bentyl ac + HS --Given improvement will see how she does on full liquids.    # 8.8 cm left ovarian mass/cyst --For outpatient follow up  # Elevated Alk phos, resolved as of yesterday       SUBJECTIVE   Abdominal pain improved over yesterday. Frequency of blood diarrhea has improved, only one thus far today  OBJECTIVE:     Vital signs in last 24 hours: Temp:  [98.4 F (36.9 C)-99 F (37.2 C)] 98.4 F (36.9 C) (08/11 0559) Pulse Rate:  [85-95] 85 (08/11 0559) Resp:  [16-18] 18 (08/11 0559) BP: (97-107)/(50-53) 97/53 (08/11 0559) SpO2:  [94 %-97 %] 94 % (08/11 0559) Last BM Date: 10/09/19 General:   Alert, in NAD. Sister is visiting Heart:  Regular rate and rhythm.  No lower extremity edema   Pulm: Normal respiratory effort   Abdomen:  Soft,  Still with significant left mid and LLQ tenderness. Nondistended.  Normal bowel sounds.          Neurologic:  Alert and  oriented,  grossly normal neurologically. Psych:  Pleasant, cooperative.   Normal mood and affect.   Intake/Output from previous day: 08/10 0701 - 08/11 0700 In: 2170.7 [P.O.:837; I.V.:1133.6; IV Piggyback:200.1] Out: 450 [Urine:450] Intake/Output this shift: Total I/O In: 390 [P.O.:60; I.V.:330] Out: 600 [Urine:600]  Lab Results: Recent Labs    10/09/19 0428 10/09/19 1348 10/10/19 0445  WBC 17.8* 20.6* 16.1*  HGB 9.2* 9.8* 8.4*  HCT 29.5* 31.2* 26.6*  PLT 339 367 300   BMET Recent Labs    10/08/19 0639 10/09/19 0428 10/10/19 0445  NA 142 139 138  K 3.9 3.4* 4.2  CL 105 108 108  CO2 '23 22 22  '$ GLUCOSE 150* 107* 103*  BUN 12 12 7*  CREATININE 0.82 0.70 0.67  CALCIUM 9.3 7.9* 8.0*   LFT Recent Labs    10/09/19 0428  PROT 5.4*  ALBUMIN 2.2*  AST 14*  ALT 13  ALKPHOS 109  BILITOT 0.4   PT/INR No results for input(s): LABPROT, INR in the last 72 hours. Hepatitis Panel No results for input(s): HEPBSAG, HCVAB, HEPAIGM, HEPBIGM in the last 72 hours.  No results found.   Principal Problem:   Acute colitis Active Problems:   Adnexal mass   Bloody stools   Lower abdominal pain   Abnormal CT scan, colon   Ischemic colitis ([place])     LOS: 2 days   Tye Savoy ,NP 10/10/2019, 1:16 PM

## 2019-10-10 NOTE — Progress Notes (Signed)
PROGRESS NOTE    Danielle Fox  GNF:[id number] DOB: 10/27/1950 DOA: 10/08/2019 PCP: Mosie Lukes, MD   Brief Narrative-69 year old female with history of C. difficile in August 2020 hyperlipidemia COPD and pyloric stenosis admitted with bloody diarrhea with CT findings consistent for transverse colon and descending colon inflammation.  She is admitted for treatment of acute colitis secondary to E. coli.  Patient reports eating food from outside. Assessment & Plan:   Principal Problem:   Acute colitis Active Problems:   Adnexal mass   Bloody stools   Lower abdominal pain   Abnormal CT scan, colon   Ischemic colitis ([place])   #1 acute colitis-stool cultures positive for enterotoxigenic E. coli.  Patient has been started on ciprofloxacin for 3 to 5 days along with oral vancomycin to prevent C. difficile recurrence.  Continue probiotics.  Patient had 2 bouts of bloody diarrhea overnight. GI following the patient thinks this is ischemic colitis.  Plavix on hold.  Patient tolerating clear liquid diet without any nausea vomiting but continues with abdominal discomfort and pain.  #2 COPD stable  #3 adnexal mass follow-up as an outpatient with PCP  Nutrition Problem: Inadequate oral intake Etiology: decreased appetite, constipation     Signs/Symptoms: per patient/family report    Interventions: Boost Breeze  Estimated body mass index is 21.16 kg/m as calculated from the following:   Height as of this encounter: 5\' 1"  (1.549 m).   Weight as of this encounter: 50.8 kg.  DVT prophylaxis: SCD due to bloody bowel movements Code Status: Full code Family Communication none at bedside Disposition Plan:  Status is: Inpatient   Dispo: The patient is from: Home              Anticipated d/c is to home              Anticipated d/c date is: Unknown              Patient currently is not medically stable to d/c.  Patient continues with bloody diarrhea GI following not ready for  discharge yet only on clear liquids    Consultants:   gi  Procedures: None Antimicrobials: Ciprofloxacin Subjective: She is resting in bed reports having bloody bowel movements overnight.  Objective: Vitals:   10/09/19 0656 10/09/19 1356 10/09/19 2132 10/10/19 0559  BP: (!) 109/59 (!) 107/51 (!) 106/50 (!) 97/53  Pulse: 92 90 95 85  Resp: 19 16 16 18   Temp: 98.6 F (37 C) 98.6 F (37 C) 99 F (37.2 C) 98.4 F (36.9 C)  TempSrc: Oral Oral Oral Oral  SpO2: 97% 96% 97% 94%  Weight:      Height:        Intake/Output Summary (Last 24 hours) at 10/10/2019 1105 Last data filed at 10/10/2019 0510 Gross per 24 hour  Intake 1630.7 ml  Output 450 ml  Net 1180.7 ml   Filed Weights   10/08/19 0536  Weight: 50.8 kg    Examination:  General exam: Appears calm and comfortable  Respiratory system: Clear to auscultation. Respiratory effort normal. Cardiovascular system: S1 & S2 heard, RRR. No JVD, murmurs, rubs, gallops or clicks. No pedal edema. Gastrointestinal system: Abdomen is nondistended, soft and tender. No organomegaly or masses felt. Normal bowel sounds heard. Central nervous system: Alert and oriented. No focal neurological deficits. Extremities: Symmetric 5 x 5 power. Skin: No rashes, lesions or ulcers Psychiatry: Judgement and insight appear normal. Mood & affect appropriate.     Data Reviewed: I  have personally reviewed following labs and imaging studies  CBC: Recent Labs  Lab 10/08/19 0639 10/09/19 0428 10/09/19 1348 10/10/19 0445  WBC 22.4* 17.8* 20.6* 16.1*  NEUTROABS 20.9*  --   --   --   HGB 12.4 9.2* 9.8* 8.4*  HCT 40.1 29.5* 31.2* 26.6*  MCV 82.2 83.6 82.5 83.1  PLT 423* 339 367 025   Basic Metabolic Panel: Recent Labs  Lab 10/08/19 0639 10/09/19 0428 10/10/19 0445  NA 142 139 138  K 3.9 3.4* 4.2  CL 105 108 108  CO2 23 22 22   GLUCOSE 150* 107* 103*  BUN 12 12 7*  CREATININE 0.82 0.70 0.67  CALCIUM 9.3 7.9* 8.0*   GFR: Estimated  Creatinine Clearance: 50.1 mL/min (by C-G formula based on SCr of 0.67 mg/dL). Liver Function Tests: Recent Labs  Lab 10/08/19 0639 10/09/19 0428  AST 23 14*  ALT 18 13  ALKPHOS 144* 109  BILITOT 0.2* 0.4  PROT 7.2 5.4*  ALBUMIN 3.2* 2.2*   Recent Labs  Lab 10/08/19 0639  LIPASE 21   No results for input(s): AMMONIA in the last 168 hours. Coagulation Profile: No results for input(s): INR, PROTIME in the last 168 hours. Cardiac Enzymes: No results for input(s): CKTOTAL, CKMB, CKMBINDEX, TROPONINI in the last 168 hours. BNP (last 3 results) No results for input(s): PROBNP in the last 8760 hours. HbA1C: No results for input(s): HGBA1C in the last 72 hours. CBG: No results for input(s): GLUCAP in the last 168 hours. Lipid Profile: No results for input(s): CHOL, HDL, LDLCALC, TRIG, CHOLHDL, LDLDIRECT in the last 72 hours. Thyroid Function Tests: No results for input(s): TSH, T4TOTAL, FREET4, T3FREE, THYROIDAB in the last 72 hours. Anemia Panel: No results for input(s): VITAMINB12, FOLATE, FERRITIN, TIBC, IRON, RETICCTPCT in the last 72 hours. Sepsis Labs: Recent Labs  Lab 10/08/19 0730  LATICACIDVEN 1.8    Recent Results (from the past 240 hour(s))  Blood culture (routine x 2)     Status: None (Preliminary result)   Collection Time: 10/08/19  7:29 AM   Specimen: Left Antecubital; Blood  Result Value Ref Range Status   Specimen Description   Final    LEFT ANTECUBITAL Performed at [facility], [address] [place], [place] [address]    Special Requests   Final    BOTTLES DRAWN AEROBIC AND ANAEROBIC Blood Culture results may not be optimal due to an inadequate volume of blood received in culture bottles Performed at [facility], [address] [place], [place] [address]    Culture   Final    NO GROWTH 1 DAY Performed at [facility], [place] [address], [place], [place] [address]    Report Status PENDING  Incomplete  SARS Coronavirus 2  by RT PCR (hospital order, performed in [facility] hospital lab) Nasopharyngeal Nasopharyngeal Swab     Status: None   Collection Time: 10/08/19  7:30 AM   Specimen: Nasopharyngeal Swab  Result Value Ref Range Status   SARS Coronavirus 2 NEGATIVE NEGATIVE Final    Comment: (NOTE) SARS-CoV-2 target nucleic acids are NOT DETECTED.  The SARS-CoV-2 RNA is generally detectable in upper and lower respiratory specimens during the acute phase of infection. The lowest concentration of SARS-CoV-2 viral copies this assay can detect is 250 copies / mL. A negative result does not preclude SARS-CoV-2 infection and should not be used as the sole basis for treatment or other patient management decisions.  A negative  result may occur with improper specimen collection / handling, submission of specimen other than nasopharyngeal swab, presence of viral mutation(s) within the areas targeted by this assay, and inadequate number of viral copies (<250 copies / mL). A negative result must be combined with clinical observations, patient history, and epidemiological information.  Fact Sheet for Patients:   StrictlyIdeas.no  Fact Sheet for Healthcare Providers: BankingDealers.co.za  This test is not yet approved or  cleared by the [place] FDA and has been authorized for detection and/or diagnosis of SARS-CoV-2 by FDA under an Emergency Use Authorization (EUA).  This EUA will remain in effect (meaning this test can be used) for the duration of the COVID-19 declaration under Section 564(b)(1) of the Act, 21 U.S.C. section 360bbb-3(b)(1), unless the authorization is terminated or revoked sooner.  Performed at [facility], [address] [place], [place] [address]   Blood culture (routine x 2)     Status: None (Preliminary result)   Collection Time: 10/08/19  8:04 AM   Specimen: BLOOD LEFT FOREARM  Result Value Ref Range Status   Specimen  Description   Final    BLOOD LEFT FOREARM Performed at [facility], [address] [place], [place] [address]    Special Requests   Final    BOTTLES DRAWN AEROBIC AND ANAEROBIC Blood Culture results may not be optimal due to an inadequate volume of blood received in culture bottles Performed at [facility], [address] [place], [place] [address]    Culture   Final    NO GROWTH 1 DAY Performed at [facility], [place] [address], [place], [place] [address]    Report Status PENDING  Incomplete  C Difficile Quick Screen w PCR reflex     Status: None   Collection Time: 10/09/19  9:52 AM  Result Value Ref Range Status   C Diff antigen NEGATIVE NEGATIVE Final   C Diff toxin NEGATIVE NEGATIVE Final   C Diff interpretation No C. difficile detected.  Final    Comment: Performed at [facility], [place] [address] [place],  [address]  Gastrointestinal Panel by PCR , Stool     Status: Abnormal   Collection Time: 10/09/19  9:52 AM  Result Value Ref Range Status   Campylobacter species NOT DETECTED NOT DETECTED Final   Plesimonas shigelloides NOT DETECTED NOT DETECTED Final   Salmonella species NOT DETECTED NOT DETECTED Final   Yersinia enterocolitica NOT DETECTED NOT DETECTED Final   Vibrio species NOT DETECTED NOT DETECTED Final   Vibrio cholerae NOT DETECTED NOT DETECTED Final   Enteroaggregative E coli (EAEC) NOT DETECTED NOT DETECTED Final   Enteropathogenic E coli (EPEC) DETECTED (A) NOT DETECTED Final    Comment: RESULT CALLED TO, READ BACK BY AND VERIFIED WITH: HANNAH FRAIDY 10/09/19 1414 KLW    Enterotoxigenic E coli (ETEC) NOT DETECTED NOT DETECTED Final   Shiga like toxin producing E coli (STEC) NOT DETECTED NOT DETECTED Final   Shigella/Enteroinvasive E coli (EIEC) NOT DETECTED NOT DETECTED Final   Cryptosporidium NOT DETECTED NOT DETECTED Final   Cyclospora cayetanensis NOT DETECTED NOT DETECTED Final   Entamoeba histolytica  NOT DETECTED NOT DETECTED Final   Giardia lamblia NOT DETECTED NOT DETECTED Final   Adenovirus F40/41 NOT DETECTED NOT DETECTED Final   Astrovirus NOT DETECTED NOT DETECTED Final   Norovirus GI/GII NOT DETECTED NOT DETECTED Final   Rotavirus A NOT DETECTED NOT DETECTED Final  Sapovirus (I, II, IV, and V) NOT DETECTED NOT DETECTED Final    Comment: Performed at [facility], [address], [place], [place] [address]         Radiology Studies: No results found.      Scheduled Meds:  dicyclomine  10 mg Oral TID AC & HS   feeding supplement  1 Container Oral TID BM   saccharomyces boulardii  250 mg Oral BID   vancomycin  125 mg Oral Q6H   Continuous Infusions:  0.9 % NaCl with KCl 40 mEq / L 75 mL/hr at 10/09/19 1155   ciprofloxacin 400 mg (10/10/19 0510)     LOS: 2 days     Georgette Shell, MD 10/10/2019, 11:05 AM

## 2019-10-11 DIAGNOSIS — A044 Other intestinal Escherichia coli infections: Principal | ICD-10-CM

## 2019-10-11 MED ORDER — SODIUM CHLORIDE 0.9 % IV SOLN: INTRAVENOUS | Status: DC

## 2019-10-11 NOTE — Progress Notes (Signed)
Nutrition Follow-up  DOCUMENTATION CODES:   Not applicable  INTERVENTION:  -d/c Boost Breeze -Vanilla Mighty Shake po BID, each supplement provides 330 kcals and 9 grams of protein -Snacks BID  Family to provide additional food from home if pt desires  NUTRITION DIAGNOSIS:   Inadequate oral intake related to decreased appetite, constipation as evidenced by per patient/family report.  Ongoing  GOAL:   Patient will meet greater than or equal to 90% of their needs  Progressing  MONITOR:   PO intake, Supplement acceptance, Labs, Weight trends, I & O's  REASON FOR ASSESSMENT:   Malnutrition Screening Tool    ASSESSMENT:   Pt admitted for treatment of acute colitis 2/2 E. Coli. PMH includes osteopenia, pre-diabetes, questionable stroke 09/15/2019, COPD, positive TB skin test, pyloric stenosis, C. difficile 09/2018 and hyperplastic colon polyp  Per GI, pt's presentation is suspicious for ischemic colitis though stool studies positive for enteropathogenic E. coli.   Pt reports appetite is horrible. Pt does not like hospital food. Pt did not like Boost Breeze and declines use of Ensure Enlive. Pt states she does not like thick supplements or anything with artificial sweeteners. Discussed different protein options with pt and family. Family agreeable to bringing in options from home for the pt to try.   Per weight records, pt has lost 14 lbs since 3/16 (11% wt loss x 5 months, significant for time frame).   PO Intake: 0% x 1 recorded meal  Labs reviewed. Medications: Boost Breeze po TID, Florastor, IV abx  NUTRITION - FOCUSED PHYSICAL EXAM:    Most Recent Value  Orbital Region No depletion  Upper Arm Region Mild depletion  Thoracic and Lumbar Region No depletion  Buccal Region No depletion  Temple Region No depletion  Clavicle Bone Region No depletion  Clavicle and Acromion Bone Region No depletion  Scapular Bone Region No depletion  Dorsal Hand No depletion  Patellar  Region No depletion  Anterior Thigh Region Mild depletion  Posterior Calf Region Mild depletion  Edema (RD Assessment) None  Hair Reviewed  Eyes Reviewed  Mouth Reviewed  Skin Reviewed  Nails Reviewed       Diet Order:   Diet Order            DIET SOFT Room service appropriate? Yes; Fluid consistency: Thin  Diet effective now                 EDUCATION NEEDS:   No education needs have been identified at this time  Skin:  Skin Assessment: Reviewed RN Assessment  Last BM:  8/11 type 7  Height:   Ht Readings from Last 1 Encounters:  10/08/19 5\' 1"  (1.549 m)    Weight:   Wt Readings from Last 10 Encounters:  10/08/19 50.8 kg  10/02/19 50.8 kg  09/27/19 52 kg  09/18/19 53.3 kg  09/17/19 53.1 kg  08/27/19 53.1 kg  05/15/19 57.4 kg  03/16/19 54.9 kg  11/22/18 54.9 kg  10/18/18 54.9 kg    BMI:  Body mass index is 21.16 kg/m.  Estimated Nutritional Needs:   Kcal:  1550-1750  Protein:  65-75g  Fluid:  1.5L/day    Larkin Ina, MS, RD, LDN RD pager number and weekend/on-call pager number located in [place].

## 2019-10-11 NOTE — Care Management Important Message (Signed)
Important Message  Patient Details IM Letter given to the Patient Name: Danielle Fox MRN: [id number] Date of Birth: 07-31-1950   Medicare Important Message Given:  Yes     Kerin Salen 10/11/2019, 9:57 AM

## 2019-10-11 NOTE — Progress Notes (Addendum)
PROGRESS NOTE    Danielle Fox  AST:[id number] DOB: 23-Dec-1950 DOA: 10/08/2019 PCP: Mosie Lukes, MD   Brief Narrative-69 year old female with history of C. difficile in August 2020 hyperlipidemia COPD and pyloric stenosis admitted with bloody diarrhea with CT findings consistent for transverse colon and descending colon inflammation.  She is admitted for treatment of acute colitis secondary to E. coli.  Patient reports eating food from outside. Assessment & Plan:   Principal Problem:   Acute colitis Active Problems:   Adnexal mass   Bloody stools   Lower abdominal pain   Abnormal CT scan, colon   Ischemic colitis ([place])   #1 acute colitis-stool cultures positive for enterotoxigenic E. coli.  Patient has been started on ciprofloxacin for 3 to 5 days along with oral vancomycin to prevent C. difficile recurrence.  Continue probiotics.  Patient continues with multiple loose bowel movements every 2-2 and half hours with some evidence of fresh blood.   GI following the patient thinks this is ischemic colitis.  Plavix on hold.  Patient tolerating clear liquid diet without any nausea vomiting but continues with abdominal discomfort diarrhea and pain. Continue IV fluids.  #2 COPD stable  #3 adnexal mass follow-up as an outpatient with PCP  Nutrition Problem: Inadequate oral intake Etiology: decreased appetite, constipation     Signs/Symptoms: per patient/family report    Interventions: Boost Breeze  Estimated body mass index is 21.16 kg/m as calculated from the following:   Height as of this encounter: 5\' 1"  (1.549 m).   Weight as of this encounter: 50.8 kg.  DVT prophylaxis: SCD due to bloody bowel movements Code Status: Full code Family Communication none at bedside Disposition Plan:  Status is: Inpatient   Dispo: The patient is from: Home              Anticipated d/c is to home              Anticipated d/c date is: Unknown              Patient currently is not  medically stable to d/c.  Patient continues with bloody diarrhea GI following not ready for discharge yet only on clear liquids    Consultants:   gi  Procedures: None Antimicrobials: Ciprofloxacin Subjective: She reports having multiple bowel movements overnight every 2 hours to 2-1/2 hours she had loose BMs with blood tinge Objective: Vitals:   10/10/19 1347 10/10/19 2112 10/11/19 0616 10/11/19 1310  BP: (!) 105/49 (!) 111/58 118/68 112/62  Pulse: 92 85 91 88  Resp: 15 17 18 16   Temp: 98 F (36.7 C) 98.6 F (37 C) 97.6 F (36.4 C) (!) 97.5 F (36.4 C)  TempSrc: Oral Oral Oral Oral  SpO2: 99% 98% 99% 100%  Weight:      Height:        Intake/Output Summary (Last 24 hours) at 10/11/2019 1336 Last data filed at 10/11/2019 0954 Gross per 24 hour  Intake 2042.16 ml  Output 1702 ml  Net 340.16 ml   Filed Weights   10/08/19 0536  Weight: 50.8 kg    Examination:  General exam: Appears calm and comfortable  Respiratory system: Clear to auscultation. Respiratory effort normal. Cardiovascular system: S1 & S2 heard, RRR. No JVD, murmurs, rubs, gallops or clicks. No pedal edema. Gastrointestinal system: Abdomen is nondistended, soft and tender. No organomegaly or masses felt. Normal bowel sounds heard. Central nervous system: Alert and oriented. No focal neurological deficits. Extremities: Symmetric 5 x 5 power.  Skin: No rashes, lesions or ulcers Psychiatry: Judgement and insight appear normal. Mood & affect appropriate.     Data Reviewed: I have personally reviewed following labs and imaging studies  CBC: Recent Labs  Lab 10/08/19 0639 10/09/19 0428 10/09/19 1348 10/10/19 0445  WBC 22.4* 17.8* 20.6* 16.1*  NEUTROABS 20.9*  --   --   --   HGB 12.4 9.2* 9.8* 8.4*  HCT 40.1 29.5* 31.2* 26.6*  MCV 82.2 83.6 82.5 83.1  PLT 423* 339 367 428   Basic Metabolic Panel: Recent Labs  Lab 10/08/19 0639 10/09/19 0428 10/10/19 0445  NA 142 139 138  K 3.9 3.4* 4.2  CL  105 108 108  CO2 23 22 22   GLUCOSE 150* 107* 103*  BUN 12 12 7*  CREATININE 0.82 0.70 0.67  CALCIUM 9.3 7.9* 8.0*   GFR: Estimated Creatinine Clearance: 50.1 mL/min (by C-G formula based on SCr of 0.67 mg/dL). Liver Function Tests: Recent Labs  Lab 10/08/19 0639 10/09/19 0428  AST 23 14*  ALT 18 13  ALKPHOS 144* 109  BILITOT 0.2* 0.4  PROT 7.2 5.4*  ALBUMIN 3.2* 2.2*   Recent Labs  Lab 10/08/19 0639  LIPASE 21   No results for input(s): AMMONIA in the last 168 hours. Coagulation Profile: No results for input(s): INR, PROTIME in the last 168 hours. Cardiac Enzymes: No results for input(s): CKTOTAL, CKMB, CKMBINDEX, TROPONINI in the last 168 hours. BNP (last 3 results) No results for input(s): PROBNP in the last 8760 hours. HbA1C: No results for input(s): HGBA1C in the last 72 hours. CBG: No results for input(s): GLUCAP in the last 168 hours. Lipid Profile: No results for input(s): CHOL, HDL, LDLCALC, TRIG, CHOLHDL, LDLDIRECT in the last 72 hours. Thyroid Function Tests: No results for input(s): TSH, T4TOTAL, FREET4, T3FREE, THYROIDAB in the last 72 hours. Anemia Panel: No results for input(s): VITAMINB12, FOLATE, FERRITIN, TIBC, IRON, RETICCTPCT in the last 72 hours. Sepsis Labs: Recent Labs  Lab 10/08/19 0730  LATICACIDVEN 1.8    Recent Results (from the past 240 hour(s))  Blood culture (routine x 2)     Status: None (Preliminary result)   Collection Time: 10/08/19  7:29 AM   Specimen: Left Antecubital; Blood  Result Value Ref Range Status   Specimen Description   Final    LEFT ANTECUBITAL Performed at [facility], [address] [place], [place] [address]    Special Requests   Final    BOTTLES DRAWN AEROBIC AND ANAEROBIC Blood Culture results may not be optimal due to an inadequate volume of blood received in culture bottles Performed at [facility], [address] [place], [place] [address]    Culture   Final    NO GROWTH 3  DAYS Performed at [facility], [place] [address], [place], [place] [address]    Report Status PENDING  Incomplete  SARS Coronavirus 2 by RT PCR (hospital order, performed in [facility] hospital lab) Nasopharyngeal Nasopharyngeal Swab     Status: None   Collection Time: 10/08/19  7:30 AM   Specimen: Nasopharyngeal Swab  Result Value Ref Range Status   SARS Coronavirus 2 NEGATIVE NEGATIVE Final    Comment: (NOTE) SARS-CoV-2 target nucleic acids are NOT DETECTED.  The SARS-CoV-2 RNA is generally detectable in upper and lower respiratory specimens during the acute phase of infection. The lowest concentration of SARS-CoV-2 viral copies this assay can detect is 250 copies / mL. A negative result does  not preclude SARS-CoV-2 infection and should not be used as the sole basis for treatment or other patient management decisions.  A negative result may occur with improper specimen collection / handling, submission of specimen other than nasopharyngeal swab, presence of viral mutation(s) within the areas targeted by this assay, and inadequate number of viral copies (<250 copies / mL). A negative result must be combined with clinical observations, patient history, and epidemiological information.  Fact Sheet for Patients:   StrictlyIdeas.no  Fact Sheet for Healthcare Providers: BankingDealers.co.za  This test is not yet approved or  cleared by the [place] FDA and has been authorized for detection and/or diagnosis of SARS-CoV-2 by FDA under an Emergency Use Authorization (EUA).  This EUA will remain in effect (meaning this test can be used) for the duration of the COVID-19 declaration under Section 564(b)(1) of the Act, 21 U.S.C. section 360bbb-3(b)(1), unless the authorization is terminated or revoked sooner.  Performed at [facility], [address] [place], [place] [address]   Blood culture (routine x 2)     Status:  None (Preliminary result)   Collection Time: 10/08/19  8:04 AM   Specimen: BLOOD LEFT FOREARM  Result Value Ref Range Status   Specimen Description   Final    BLOOD LEFT FOREARM Performed at [facility], [address] [place], [place] [address]    Special Requests   Final    BOTTLES DRAWN AEROBIC AND ANAEROBIC Blood Culture results may not be optimal due to an inadequate volume of blood received in culture bottles Performed at [facility], [address] [place], [place] [address]    Culture   Final    NO GROWTH 3 DAYS Performed at [facility], [place] [address], [place], [place] [address]    Report Status PENDING  Incomplete  C Difficile Quick Screen w PCR reflex     Status: None   Collection Time: 10/09/19  9:52 AM  Result Value Ref Range Status   C Diff antigen NEGATIVE NEGATIVE Final   C Diff toxin NEGATIVE NEGATIVE Final   C Diff interpretation No C. difficile detected.  Final    Comment: Performed at [facility], [place] [address], [place], [place] [address]  Gastrointestinal Panel by PCR , Stool     Status: Abnormal   Collection Time: 10/09/19  9:52 AM  Result Value Ref Range Status   Campylobacter species NOT DETECTED NOT DETECTED Final   Plesimonas shigelloides NOT DETECTED NOT DETECTED Final   Salmonella species NOT DETECTED NOT DETECTED Final   Yersinia enterocolitica NOT DETECTED NOT DETECTED Final   Vibrio species NOT DETECTED NOT DETECTED Final   Vibrio cholerae NOT DETECTED NOT DETECTED Final   Enteroaggregative E coli (EAEC) NOT DETECTED NOT DETECTED Final   Enteropathogenic E coli (EPEC) DETECTED (A) NOT DETECTED Final    Comment: RESULT CALLED TO, READ BACK BY AND VERIFIED WITH: HANNAH FRAIDY 10/09/19 1414 KLW    Enterotoxigenic E coli (ETEC) NOT DETECTED NOT DETECTED Final   Shiga like toxin producing E coli (STEC) NOT DETECTED NOT DETECTED Final   Shigella/Enteroinvasive E coli (EIEC) NOT DETECTED NOT DETECTED  Final   Cryptosporidium NOT DETECTED NOT DETECTED Final   Cyclospora cayetanensis NOT DETECTED NOT DETECTED Final   Entamoeba histolytica NOT DETECTED NOT DETECTED Final   Giardia lamblia NOT DETECTED NOT DETECTED Final   Adenovirus F40/41 NOT DETECTED NOT DETECTED Final   Astrovirus NOT  DETECTED NOT DETECTED Final   Norovirus GI/GII NOT DETECTED NOT DETECTED Final   Rotavirus A NOT DETECTED NOT DETECTED Final   Sapovirus (I, II, IV, and V) NOT DETECTED NOT DETECTED Final    Comment: Performed at [facility], [address] [place], [place] [address]         Radiology Studies: No results found.      Scheduled Meds: . dicyclomine  10 mg Oral TID AC & HS  . feeding supplement  1 Container Oral TID BM  . saccharomyces boulardii  250 mg Oral BID  . vancomycin  125 mg Oral Q6H   Continuous Infusions: . 0.9 % NaCl with KCl 40 mEq / L 75 mL/hr at 10/11/19 0939  . ciprofloxacin 400 mg (10/11/19 0520)     LOS: 3 days     Georgette Shell, MD 10/11/2019, 1:36 PM

## 2019-10-11 NOTE — Progress Notes (Signed)
° ° ° °  Progress Note        ASSESSMENT AND PLAN:   CC:   GI bleed   69 yo female with pmh significant for but not limited to osteopenia, pre-diabetes,questionable stroke7/17/2021,COPD,positive TB skin test,pyloricstenosis, C. difficile 09/2018, colonic stricture   # Hematochezia on Plavix / colitis of transverse and splenic flexure on CT scan --Improving. Bloody diarrhea small volume. Only two episodes so far today and abdominal pain improved. Not as tender on exam.   --Stool studies + Enteropathogenic E.Coli. C-diff negative.  --Still concerned about underlying ischemic colitis.  --Hgb declined significantly from 12.4 to 9.2 to 8.4 yesterday. Will check CBC tomorrow. Monitor for now.  --Continue Cipro. Also oral Vancomycin was started for C-diff prophylaxis ( has history of C-diff).  --Continue Florastor --Continue Bentyl ac + HS --Tolerating soft diet , just doesn't care for hospital food.  --Hopefully home tomorrow  #  8.8 cm left ovarian mass/cyst -Recommend gyn consult to assess ovarian mass        SUBJECTIVE   Feels even better today than she did yesterday. Only two episodes of small volume bloody diarrhea today thus far. No significant abdominal pain     OBJECTIVE:     Vital signs in last 24 hours: Temp:  [97.5 F (36.4 C)-98.6 F (37 C)] 97.5 F (36.4 C) (08/12 1310) Pulse Rate:  [85-92] 88 (08/12 1310) Resp:  [15-18] 16 (08/12 1310) BP: (105-118)/(49-68) 112/62 (08/12 1310) SpO2:  [98 %-100 %] 100 % (08/12 1310) Last BM Date: 10/10/19 General:   Alert, in NAD Heart:  Regular rate and rhythm.  No lower extremity edema   Pulm: Normal respiratory effort   Abdomen:  Soft,  Mild LLQ tenderness. Nondistended.  Normal bowel sounds.          Neurologic:  Alert and  oriented,  grossly normal neurologically. Psych:  Pleasant, cooperative.  Normal mood and affect.   Intake/Output from previous day: 08/11 0701 - 08/12 0700 In: 2512.2 [P.O.:450;  I.V.:1657.3; IV Piggyback:404.9] Out: 1802 [Urine:1801; Stool:1] Intake/Output this shift: Total I/O In: 120 [P.O.:120] Out: 500 [Urine:500]  Lab Results: Recent Labs    10/09/19 0428 10/09/19 1348 10/10/19 0445  WBC 17.8* 20.6* 16.1*  HGB 9.2* 9.8* 8.4*  HCT 29.5* 31.2* 26.6*  PLT 339 367 300   BMET Recent Labs    10/09/19 0428 10/10/19 0445  NA 139 138  K 3.4* 4.2  CL 108 108  CO2 22 22  GLUCOSE 107* 103*  BUN 12 7*  CREATININE 0.70 0.67  CALCIUM 7.9* 8.0*   LFT Recent Labs    10/09/19 0428  PROT 5.4*  ALBUMIN 2.2*  AST 14*  ALT 13  ALKPHOS 109  BILITOT 0.4       Principal Problem:   Acute colitis Active Problems:   Adnexal mass   Bloody stools   Lower abdominal pain   Abnormal CT scan, colon   Ischemic colitis ([place])     LOS: 3 days   Tye Savoy ,NP 10/11/2019, 1:32 PM

## 2019-10-12 ENCOUNTER — Telehealth: Payer: Self-pay | Admitting: Family Medicine

## 2019-10-12 ENCOUNTER — Encounter: Payer: Self-pay | Admitting: Family Medicine

## 2019-10-12 ENCOUNTER — Telehealth: Payer: Self-pay

## 2019-10-12 LAB — COMPREHENSIVE METABOLIC PANEL
ALT: 10 U/L (ref 0–44)
AST: 19 U/L (ref 15–41)
Albumin: 2.4 g/dL — ABNORMAL LOW (ref 3.5–5.0)
Alkaline Phosphatase: 78 U/L (ref 38–126)
Anion gap: 13 (ref 5–15)
BUN: <5 mg/dL — ABNORMAL LOW (ref 8–23)
CO2: 21 mmol/L — ABNORMAL LOW (ref 22–32)
Calcium: 8.6 mg/dL — ABNORMAL LOW (ref 8.9–10.3)
Chloride: 108 mmol/L (ref 98–111)
Creatinine, Ser: 0.62 mg/dL (ref 0.44–1.00)
GFR calc Af Amer: >60 mL/min (ref >60)
GFR calc non Af Amer: >60 mL/min (ref >60)
Glucose, Bld: 86 mg/dL (ref 70–99)
Potassium: 4.7 mmol/L (ref 3.5–5.1)
Sodium: 142 mmol/L (ref 135–145)
Total Bilirubin: 1.1 mg/dL (ref 0.3–1.2)
Total Protein: 5.5 g/dL — ABNORMAL LOW (ref 6.5–8.1)

## 2019-10-12 LAB — CBC
HCT: 31.3 % — ABNORMAL LOW (ref 36.0–46.0)
Hemoglobin: 9.5 g/dL — ABNORMAL LOW (ref 12.0–15.0)
MCH: 25.7 pg — ABNORMAL LOW (ref 26.0–34.0)
MCHC: 30.4 g/dL (ref 30.0–36.0)
MCV: 84.8 fL (ref 80.0–100.0)
Platelets: 348 10*3/uL (ref 150–400)
RBC: 3.69 MIL/uL — ABNORMAL LOW (ref 3.87–5.11)
RDW: 14.8 % (ref 11.5–15.5)
WBC: 11.1 10*3/uL — ABNORMAL HIGH (ref 4.0–10.5)
nRBC: 0 % (ref 0.0–0.2)

## 2019-10-12 MED ORDER — DICYCLOMINE HCL 10 MG PO CAPS: 10.0000 mg | ORAL_CAPSULE | Freq: Three times a day (TID) | ORAL | 0 refills | Status: DC

## 2019-10-12 MED ORDER — VANCOMYCIN 50 MG/ML ORAL SOLUTION: 125.0000 mg | Freq: Four times a day (QID) | ORAL | 0 refills | Status: DC

## 2019-10-12 MED ORDER — SACCHAROMYCES BOULARDII 250 MG PO CAPS: 250.0000 mg | ORAL_CAPSULE | Freq: Two times a day (BID) | ORAL | 1 refills | Status: DC

## 2019-10-12 NOTE — Progress Notes (Signed)
Pt alert and oriented. D/C instructions given, pt d/cd to home. 

## 2019-10-12 NOTE — Telephone Encounter (Signed)
Patient states she was seen in the ED was prescribed Vancomycin. However the pharmacy doesn't have any. The patient is wondering what can she do?

## 2019-10-12 NOTE — Telephone Encounter (Signed)
She can have the pharmacy transfer the prescription to another pharmacy or we can send it elsewhere if she would like.

## 2019-10-12 NOTE — Progress Notes (Signed)
° ° ° °  Progress Note        ASSESSMENT AND PLAN:   CC:   Bloody diarrhea   69 yo female with pmh significant for but not limited toosteopenia, pre-diabetes,questionable stroke7/17/2021,COPD,positive TB skin test,pyloricstenosis, C. difficile 09/2018, colonic stricture   # Hematochezia  / colitis on CT scan. Presentation suspicious for ischemic colitis though stool studies + for Enteropathogenic E.coli.  --Frequency of bloody diarrhea has significantly improved, none today. Passing what looks like a small amount of blood tinged mucous.   --Stool studies + Enteropathogenic E.Coli.  --C-diff negative.  --Still concerned about underlying ischemic colitis given presentation and radiologic findings. .  --WBC continues to improve 20.6 to 16 and now 11.  --Hgb has stabilized, actually improved to 9.5 ( down from 12.4 on admission).  --Tolerating solids --She is dressed and ready for discharge.  --She will complete 4 days of Cipro this evening.  --Per AVS she will continue oral Vancomycin for C-diff prophylaxis for additional 7 days.   --Continue Florastor --Continue Bentyl ac + HS as needed --Follow up with me on Sept 2 at 230 pm. Call office in interim for questions / recurrent symptoms.   #8.8 cm left ovarian mass/cyst --For outpatient follow up       SUBJECTIVE   Dressed and ready for discharge. No diarrhea / bleeding today. No abdominal pain. Tolerating solids    OBJECTIVE:     Vital signs in last 24 hours: Temp:  [97.5 F (36.4 C)-98.2 F (36.8 C)] 98.1 F (36.7 C) (08/13 0553) Pulse Rate:  [75-88] 79 (08/13 0553) Resp:  [16] 16 (08/13 0553) BP: (112-118)/(62-67) 112/64 (08/13 0553) SpO2:  [97 %-100 %] 97 % (08/13 0553) Last BM Date: 10/12/19 General:   Alert, in NAD Heart:  Regular rate and rhythm.  No lower extremity edema   Pulm: Normal respiratory effort   Abdomen:  Soft,  nontender, nondistended.  Normal bowel sounds.          Neurologic:  Alert and   oriented,  grossly normal neurologically. Psych:  Pleasant, cooperative.  Normal mood and affect.   Intake/Output from previous day: 08/12 0701 - 08/13 0700 In: 2545.2 [P.O.:410; I.V.:1579.9; IV Piggyback:555.3] Out: 2800 [Urine:2800] Intake/Output this shift: Total I/O In: 240 [P.O.:240] Out: 700 [Urine:700]  Lab Results: Recent Labs    10/09/19 1348 10/10/19 0445 10/12/19 0508  WBC 20.6* 16.1* 11.1*  HGB 9.8* 8.4* 9.5*  HCT 31.2* 26.6* 31.3*  PLT 367 300 348   BMET Recent Labs    10/10/19 0445 10/12/19 0508  NA 138 142  K 4.2 4.7  CL 108 108  CO2 22 21*  GLUCOSE 103* 86  BUN 7* <5*  CREATININE 0.67 0.62  CALCIUM 8.0* 8.6*   LFT Recent Labs    10/12/19 0508  PROT 5.5*  ALBUMIN 2.4*  AST 19  ALT 10  ALKPHOS 78  BILITOT 1.1     Principal Problem:   Acute colitis Active Problems:   Adnexal mass   Bloody stools   Lower abdominal pain   Abnormal CT scan, colon   Ischemic colitis ([place])     LOS: 4 days   Tye Savoy ,NP 10/12/2019, 10:47 AM

## 2019-10-12 NOTE — Telephone Encounter (Signed)
Transition Care Management Follow-up Telephone Call  Date of discharge and from where: 10/12/2019, [facility]  How have you been since you were released from the hospital? Patient states that she is doing better since her discharge  Any questions or concerns? Yes, Patient needs someone to call her back from the office concerning a pharmacy that can fill the liquid form of her vancomycin. Her pharmacy ([facility]) does not carry it in liquid form.   Items Reviewed:  Did the pt receive and understand the discharge instructions provided? Yes   Medications obtained and verified? Yes   Any new allergies since your discharge? No   Dietary orders reviewed? Yes  Do you have support at home? Yes   Functional Questionnaire: (I = Independent and D = Dependent) ADLs: I  Bathing/Dressing- I  Meal Prep- I  Eating- I  Maintaining continence- I  Transferring/Ambulation- I  Managing Meds- I  Follow up appointments reviewed:   PCP Hospital f/u appt confirmed? No  Patient states that she will call the office back next week to schedule an appointment. She did not have time right now to do so.   [facility] f/u appt confirmed? N/A   Are transportation arrangements needed? No   If their condition worsens, is the pt aware to call PCP or go to the Emergency Dept.? Yes  Was the patient provided with contact information for the PCP's office or ED? Yes  Was to pt encouraged to call back with questions or concerns? Yes

## 2019-10-13 LAB — CULTURE, BLOOD (ROUTINE X 2)
Culture: NO GROWTH
Culture: NO GROWTH

## 2019-10-13 NOTE — Discharge Summary (Signed)
Physician Discharge Summary  Danielle Fox:[id number] DOB: [birth year] DOA: 10/08/2019  PCP: Mosie Lukes, MD  Admit date: 10/08/2019 Discharge date: 10/13/2019  Admitted From: Home Disposition: Home Recommendations for Outpatient Follow-up:  1. Follow up with PCP in 1-2 weeks 2. Please obtain BMP/CBC in one week 3. Follow-up with GI  Home Health: None Equipment/Devices: None  Discharge Condition stable CODE STATUS: Full code Diet recommendation: Cardiac soft diet Brief/Interim Summary:69 year old female with history of C. difficile in August 2020 hyperlipidemia COPD and pyloric stenosis admitted with bloody diarrhea with CT findings consistent for transverse colon and descending colon inflammation.  She is admitted for treatment of acute colitis secondary to E. coli.  Patient reports eating food from outside.  Discharge Diagnoses:  Principal Problem:   Acute colitis Active Problems:   Adnexal mass   Bloody stools   Lower abdominal pain   Abnormal CT scan, colon   Ischemic colitis ([place])     #1 acute colitis-stool cultures positive for enterotoxigenic E. coli.    Patient was treated with ciprofloxacin for 4 days during the hospital stay along with oral vancomycin to prevent C. difficile recurrence.  She will be discharged home on vancomycin for 7 days along with probiotics and Bentyl.  She will follow up with GI on discharge.    #2 COPD stable  #3 adnexal mass follow-up as an outpatient with PCP    Nutrition Problem: Inadequate oral intake Etiology: decreased appetite, constipation    Signs/Symptoms: per patient/family report     Interventions: Boost Breeze  Estimated body mass index is 21.16 kg/m as calculated from the following:   Height as of this encounter: 5\' 1"  (1.549 m).   Weight as of this encounter: 50.8 kg.  Discharge Instructions  Discharge Instructions    Diet - low sodium heart healthy   Complete by: As directed    Increase activity  slowly   Complete by: As directed      Allergies as of 10/12/2019      Reactions   Doxycycline Nausea And Vomiting   Erythromycin Base    Other reaction(s): GI Upset (intolerance)      Medication List    STOP taking these medications   aspirin 81 MG EC tablet     TAKE these medications   acetaminophen 325 MG tablet Commonly known as: TYLENOL Take 650 mg by mouth every 6 (six) hours as needed for mild pain.   atorvastatin 40 MG tablet Commonly known as: LIPITOR Take 1 tablet by mouth at bedtime.   budesonide-formoterol 80-4.5 MCG/ACT inhaler Commonly known as: Symbicort Inhale 2 puffs into the lungs in the morning and at bedtime. What changed:   when to take this  reasons to take this   clopidogrel 75 MG tablet Commonly known as: PLAVIX Take 1 tablet (75 mg total) by mouth daily.   dicyclomine 10 MG capsule Commonly known as: BENTYL Take 1 capsule (10 mg total) by mouth 4 (four) times daily -  before meals and at bedtime.   esomeprazole 40 MG capsule Commonly known as: NEXIUM Take 1 capsule (40 mg total) by mouth daily before breakfast.   gabapentin 100 MG capsule Commonly known as: Neurontin Take 3 capsules (300 mg total) by mouth 3 (three) times daily.   levETIRAcetam 500 MG tablet Commonly known as: KEPPRA Take 1 tablet (500 mg total) by mouth 2 (two) times daily.   ondansetron 4 MG tablet Commonly known as: Zofran Take 1 tablet (4 mg total) by mouth  every 8 (eight) hours as needed for nausea or vomiting.   saccharomyces boulardii 250 MG capsule Commonly known as: FLORASTOR Take 1 capsule (250 mg total) by mouth 2 (two) times daily.   vancomycin 50 mg/mL  oral solution Commonly known as: VANCOCIN Take 2.5 mLs (125 mg total) by mouth every 6 (six) hours for 7 days.       Follow-up Information    Mosie Lukes, MD Follow up.   Specialty: Family Medicine Contact information: [address] [place]  [address] [phone]              Allergies  Allergen Reactions  . Doxycycline Nausea And Vomiting  . Erythromycin Base     Other reaction(s): GI Upset (intolerance)    Consultations: GI  Procedures/Studies: EEG  Result Date: 09/28/2019 Lora Havens, MD     09/28/2019  1:09 PM Patient Name: Danielle Fox MRN: [id number] Epilepsy Attending: Lora Havens Referring Physician/Provider: Dr Kerney Elbe Date: 09/28/2019 Duration: 23.36 mins Patient history: 69 y.o.femalewitha recentpresentation at an OSH in [place] for what was diagnosed as a stroke (08/2019) in the context of atypical imaging appearance on MRI, who re-presented [facility] on Thursday as a code stroke for left sidedweakness and facial droop,worsened relative to her recent newbaseline. EEG to evaluate for seizure. Level of alertness: Awake, drowsy AEDs during EEG study: None Technical aspects: This EEG study was done with scalp electrodes positioned according to the 10-20 International system of electrode placement. Electrical activity was acquired at a sampling rate of 500Hz  and reviewed with a high frequency filter of 70Hz  and a low frequency filter of 1Hz . EEG data were recorded continuously and digitally stored. Description: The posterior dominant rhythm consists of 9 Hz activity of moderate voltage (25-35 uV) seen predominantly in posterior head regions, symmetric and reactive to eye opening and eye closing. Drowsiness was characterized by attenuation of the posterior background rhythm. EEG showed intermittent 3 to 6 Hz theta-delta slowing in right hemisphere.  Hyperventilation and photic stimulation were not performed.   ABNORMALITY -Intermittent slow, right hemisphere IMPRESSION: This study is suggestive of cortical dysfunction in right hemisphere nonspecific etiology but likely related to underlying cortical abnormality. No seizures or epileptiform discharges were seen throughout the recording. Lora Havens   CT Code Stroke CTA Head W/WO contrast  Result Date: 09/27/2019 CLINICAL DATA:  Left-sided weakness EXAM: CT ANGIOGRAPHY HEAD AND NECK TECHNIQUE: Multidetector CT imaging of the head and neck was performed using the standard protocol during bolus administration of intravenous contrast. Multiplanar CT image reconstructions and MIPs were obtained to evaluate the vascular anatomy. Carotid stenosis measurements (when applicable) are obtained utilizing NASCET criteria, using the distal internal carotid diameter as the denominator. CONTRAST:  56mL OMNIPAQUE IOHEXOL 350 MG/ML SOLN COMPARISON:  None. FINDINGS: CTA NECK Aortic arch: Mild plaque along the aortic arch. Great vessel origins are patent. There is eccentric primarily noncalcified plaque at the left subclavian origin causing approximately 60% stenosis. Ulcerated plaque is also noted. Right carotid system: Patent. Minimal calcified plaque at the ICA origin without measurable stenosis. Left carotid system: Patent. Mild calcified plaque at the ICA origin without measurable stenosis. Vertebral arteries: Patent.  Codominant.  No measurable stenosis. Skeleton: Degenerative changes of the cervical spine. Other neck: No mass or adenopathy. Upper chest: No apical lung mass. Review of the MIP images confirms the above findings CTA HEAD Anterior circulation: Intracranial internal carotid arteries are patent. Anterior and middle cerebral arteries  are patent. Posterior circulation: Intracranial vertebral arteries and PICA origins are patent. Basilar artery is patent. Superior cerebellar artery origins are patent. Patent posterior cerebral arteries. Venous sinuses: Patent as allowed by contrast bolus timing. Review of the MIP images confirms the above findings IMPRESSION: No large vessel occlusion. 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 Plaque ulceration is also noted. Electronically Signed   By: Macy Mis  M.D.   On: 09/27/2019 14:52   CT Head Wo Contrast  Result Date: 09/17/2019 CLINICAL DATA:  Reported numbness EXAM: CT HEAD WITHOUT CONTRAST TECHNIQUE: Contiguous axial images were obtained from the base of the skull through the vertex without intravenous contrast. COMPARISON:  Sinus CT 08/02/2014 FINDINGS: Brain: No evidence of acute infarction, hemorrhage, hydrocephalus, extra-axial collection or mass lesion/mass effect. Symmetric prominence of the ventricles, cisterns and sulci compatible with parenchymal volume loss. Patchy areas of white matter hypoattenuation are most compatible with chronic microvascular angiopathy. Vascular: Atherosclerotic calcification of the carotid siphons. No hyperdense vessel. Skull: No calvarial fracture or suspicious osseous lesion. No scalp swelling or hematoma. Sinuses/Orbits: Minimal thickening the left frontal ethmoid and right maxillary sinuses. No air-fluid levels. Mastoid air cells are well aerated. Included orbital structures are unremarkable. Other: None IMPRESSION: 1. No acute intracranial abnormality. 2. Chronic microvascular angiopathy and parenchymal volume loss. Intracranial atherosclerosis. Electronically Signed   By: Lovena Le M.D.   On: 09/17/2019 19:23   CT Code Stroke CTA Neck W/WO contrast  Result Date: 09/27/2019 CLINICAL DATA:  Left-sided weakness EXAM: CT ANGIOGRAPHY HEAD AND NECK TECHNIQUE: Multidetector CT imaging of the head and neck was performed using the standard protocol during bolus administration of intravenous contrast. Multiplanar CT image reconstructions and MIPs were obtained to evaluate the vascular anatomy. Carotid stenosis measurements (when applicable) are obtained utilizing NASCET criteria, using the distal internal carotid diameter as the denominator. CONTRAST:  86mL OMNIPAQUE IOHEXOL 350 MG/ML SOLN COMPARISON:  None. FINDINGS: CTA NECK Aortic arch: Mild plaque along the aortic arch. Great vessel origins are patent. There is  eccentric primarily noncalcified plaque at the left subclavian origin causing approximately 60% stenosis. Ulcerated plaque is also noted. Right carotid system: Patent. Minimal calcified plaque at the ICA origin without measurable stenosis. Left carotid system: Patent. Mild calcified plaque at the ICA origin without measurable stenosis. Vertebral arteries: Patent.  Codominant.  No measurable stenosis. Skeleton: Degenerative changes of the cervical spine. Other neck: No mass or adenopathy. Upper chest: No apical lung mass. Review of the MIP images confirms the above findings CTA HEAD Anterior circulation: Intracranial internal carotid arteries are patent. Anterior and middle cerebral arteries are patent. Posterior circulation: Intracranial vertebral arteries and PICA origins are patent. Basilar artery is patent. Superior cerebellar artery origins are patent. Patent posterior cerebral arteries. Venous sinuses: Patent as allowed by contrast bolus timing. Review of the MIP images confirms the above findings IMPRESSION: No large vessel occlusion. 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 Plaque ulceration is also noted. Electronically Signed   By: Macy Mis M.D.   On: 09/27/2019 14:52   MR BRAIN WO CONTRAST  Result Date: 09/27/2019 CLINICAL DATA:  69 year old female left side weakness code stroke presentation today. EXAM: MRI HEAD WITHOUT CONTRAST TECHNIQUE: Multiplanar, multiecho pulse sequences of the brain and surrounding structures were obtained without intravenous contrast. COMPARISON:  CTA head and neck, CT head earlier today. Brain MRI [facility] 09/22/2019. Report of an outside brain MRI without and with  contrast 09/15/2019. FINDINGS: Brain: No parenchymal restricted or facilitated diffusion is identified. However, there may be subtle a abnormal diffusion along the surface of the right superior frontal lobe (series 7, image 50). And  there is persistent abnormal appearance of the superior right frontal lobe and also contralateral left cingulate sulci on FLAIR imaging with hyperintensity (series 11, image 20). But no gyral edema or encephalomalacia is evident. SWI imaging is unremarkable, and noncontrast CT today demonstrated only subtle asymmetric effacement of the sulci. Questionable similar but less pronounced FLAIR abnormality along the left inferior parietal/occipital junction on series 11, image 14 today, and stable from 09/22/2019. No similar FLAIR abnormality elsewhere. No intraventricular debris. Basilar cisterns appear normal. No midline shift, mass effect, evidence of mass lesion, ventriculomegaly, extra-axial collection or acute intracranial hemorrhage. Cervicomedullary junction and pituitary are within normal limits. Vascular: Major intracranial vascular flow voids are stable. Skull and upper cervical spine: Negative visible cervical spine. Visualized bone marrow signal is within normal limits. Sinuses/Orbits: Stable, negative. Other: Mastoids remain clear.  Scalp and face appear negative. IMPRESSION: 1. Persistent abnormal FLAIR signal in the sulci over the superior right frontal convexity, first reported on 09/15/2019 and reportedly with abnormal enhancement at that time. But no evidence of developing encephalomalacia, no evidence of blood products on CT or MRI, and no convincing gyral/parenchymal signal or diffusion abnormality (although questionable abnormal leptomeningeal appearance there on DWI). Similar abnormal sulci in the left cingulate and at the left parieto-occipital junction. But other CSF spaces appear normal. 2. The etiology is unclear and CSF analysis is recommended. 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 Electronically Signed   By: Genevie Ann M.D.   On: 09/27/2019 18:49   MR Brain W Wo Contrast  Result Date: 09/24/2019 CLINICAL  DATA:  Left hand numbness. EXAM: MRI HEAD WITHOUT AND WITH CONTRAST TECHNIQUE: Multiplanar, multiecho pulse sequences of the brain and surrounding structures were obtained without and with intravenous contrast. CONTRAST:  11mL GADAVIST GADOBUTROL 1 MMOL/ML IV SOLN COMPARISON:  None. FINDINGS: Brain: No acute infarct, acute hemorrhage or extra-axial collection. There is cortical/sulcal hyperintensity within the anterior right hemisphere on the axial FLAIR sequence. White matter signal is normal. Normal volume of CSF spaces. No chronic microhemorrhage. Normal midline structures. There is no abnormal contrast enhancement. Vascular: Normal flow voids. Skull and upper cervical spine: Normal marrow signal. Sinuses/Orbits: Negative. Other: None. IMPRESSION: 1. No acute intracranial abnormality. 2. Cortical/sulcal hyperintensity within the anterior right hemisphere on the axial FLAIR sequence. According to the report from MRI performed 09/15/2019, this finding was also present at that time. This may be a late subacute sequela of an ischemic event. 3. No abnormal contrast enhancement is visible on the current study, though it was reportedly present on the prior examination. Electronically Signed   By: Ulyses Jarred M.D.   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 CT is requested to evaluate for a possible primary malignancy. Surgical history includes appendectomy. EXAM: CT ABDOMEN AND PELVIS WITH CONTRAST TECHNIQUE: Multidetector CT imaging of the abdomen and pelvis was performed using the standard protocol following bolus administration of intravenous contrast. CONTRAST:  172mL OMNIPAQUE IOHEXOL 300 MG/ML IV. COMPARISON:  None. FINDINGS: Lower chest: Heart size normal.  Visualized lung bases clear. Hepatobiliary: Liver normal in size and appearance.  Gallbladder normal in appearance without calcified gallstones. No biliary ductal dilation. Pancreas: Normal in appearance without evidence of mass, ductal dilation, or inflammation. Spleen: Normal in size and appearance. Adrenals/Urinary Tract: Normal appearing adrenal glands. Kidneys normal in size and appearance without focal parenchymal abnormality. No hydronephrosis. No evidence of urinary tract calculi. Normal appearing decompressed urinary bladder. Stomach/Bowel: Stomach normal in appearance for the degree of distention. Normal-appearing small bowel. Moderate stool burden throughout the normal appearing colon. Surgically absent appendix. Vascular/Lymphatic: Moderate to severe atherosclerosis involving the abdominal aorta with calcified and noncalcified plaque. No evidence of abdominal aortic aneurysm. Moderate iliofemoral atherosclerosis bilaterally. Normal-appearing portal venous and systemic venous systems. No pathologic lymphadenopathy. Reproductive: Large cyst arising from the LEFT adnexum, with focal mild thickening and enhancement of its wall superiorly and inferiorly, associated with a thin septum inferiorly. The mass measures approximately 9.8 x 7.5 x 8.7 cm. Uterus not visualized and presumed surgically absent. Other: None. Musculoskeletal: Facet degenerative changes involving the lower lumbar spine. Degenerative grade 1 spondylolisthesis of L4 on L5 measuring approximately 6 mm. No acute findings. IMPRESSION: 1. Approximate 10 cm complicated cyst arising from the LEFT adnexum, with focal mild thickening and enhancement of its wall superiorly and inferiorly, associated with a thin septum inferiorly. Transabdominal and transvaginal pelvic ultrasound is recommended in further evaluation to better characterize this mass. This recommendation follows ACR consensus guidelines: White Paper of the ACR Incidental Findings Committee II on Adnexal Findings. J Am Coll Radiol 2013:10:675-681. 2. No acute  abnormalities otherwise involving the abdomen or pelvis. 3. Degenerative grade 1 spondylolisthesis of L4 on L5 measuring approximately 6 mm. Aortic Atherosclerosis (ICD10-I70.0). Electronically Signed   By: Evangeline Dakin M.D.   On: 09/30/2019 14:20   CT Chest High Resolution  Result Date: 10/01/2019 CLINICAL DATA:  Concern for sarcoidosis, admitted for stroke, abnormal MRI EXAM: CT CHEST WITHOUT CONTRAST TECHNIQUE: Multidetector CT imaging of the chest was performed following the standard protocol without intravenous contrast. High resolution imaging of the lungs, as well as inspiratory and expiratory imaging, was performed. COMPARISON:  CT chest, 01/22/2019 FINDINGS: Cardiovascular: Aortic atherosclerosis. Normal heart size. No pericardial effusion. Mediastinum/Nodes: Numerous prominent, although not pathologically enlarged mediastinal and hilar lymph nodes, unchanged compared to prior examination. No calcified lymph nodes. Thyroid gland, trachea, and esophagus demonstrate no significant findings. Lungs/Pleura: Minimal nonspecific biapical pleuroparenchymal scarring and centrilobular nodularity, not significantly changed compared to prior examination. No evidence of fibrotic interstitial lung disease. No significant air trapping on expiratory phase imaging. No pleural effusion or pneumothorax. Upper Abdomen: No acute abnormality. Musculoskeletal: No chest wall mass or suspicious bone lesions identified. IMPRESSION: 1. Minimal nonspecific biapical pleuroparenchymal scarring and centrilobular nodularity, not significantly changed compared to prior examination. No evidence of fibrotic interstitial lung disease. No specific features to suggest pulmonary sarcoidosis. Centrilobular nodularity noted is most commonly seen in smoking-related respiratory bronchiolitis. 2. Numerous prominent, although not pathologically enlarged mediastinal and hilar lymph nodes, unchanged compared to prior examination. No calcified  lymph nodes. No specific features to suggest nodal sarcoidosis. 3. Aortic Atherosclerosis (ICD10-I70.0). Electronically Signed   By: Eddie Candle M.D.   On: 10/01/2019 08:22   ECHOCARDIOGRAM COMPLETE  Result Date: 09/28/2019    ECHOCARDIOGRAM REPORT   Patient Name:   RENEZMAE CANLAS Date of Exam: 09/27/2019 Medical Rec #:  [id number]         Height:       61.0 in Accession #:    [id number]        Weight:  117.4 lb Date of Birth:  September 14, 1950          BSA:          1.506 m Patient Age:    65 years          BP:           121/56 mmHg Patient Gender: F                 HR:           91 bpm. Exam Location:  Inpatient Procedure: 2D Echo Indications:    TIA  History:        Patient has prior history of Echocardiogram examinations, most                 recent 11/11/2017. COPD; Risk Factors:Former Smoker.  Sonographer:    Jannett Celestine RDCS (AE) Referring Phys: [id number] Lequita Halt  Sonographer Comments: Suboptimal parasternal window. off axis apical windows IMPRESSIONS  1. Left ventricular ejection fraction, by estimation, is 60 to 65%. The left ventricle has normal function. The left ventricle has no regional wall motion abnormalities. Left ventricular diastolic parameters were normal.  2. Right ventricular systolic function is normal. The right ventricular size is normal. Tricuspid regurgitation signal is inadequate for assessing PA pressure.  3. The mitral valve is normal in structure. No evidence of mitral valve regurgitation.  4. The aortic valve was not well visualized. Aortic valve regurgitation is not visualized. No aortic stenosis is present. FINDINGS  Left Ventricle: Left ventricular ejection fraction, by estimation, is 60 to 65%. The left ventricle has normal function. The left ventricle has no regional wall motion abnormalities. The left ventricular internal cavity size was normal in size. There is  no left ventricular hypertrophy. Left ventricular diastolic parameters were normal. Right Ventricle: The  right ventricular size is normal. No increase in right ventricular wall thickness. Right ventricular systolic function is normal. Tricuspid regurgitation signal is inadequate for assessing PA pressure. Left Atrium: Left atrial size was normal in size. Right Atrium: Right atrial size was not well visualized. Pericardium: There is no evidence of pericardial effusion. Mitral Valve: The mitral valve is normal in structure. No evidence of mitral valve regurgitation. Tricuspid Valve: The tricuspid valve is grossly normal. Tricuspid valve regurgitation is trivial. Aortic Valve: The aortic valve was not well visualized. Aortic valve regurgitation is not visualized. No aortic stenosis is present. Pulmonic Valve: The pulmonic valve was not well visualized. Pulmonic valve regurgitation is not visualized. Aorta: The aortic root was not well visualized. IAS/Shunts: The interatrial septum was not well visualized.  LEFT VENTRICLE PLAX 2D LVIDd:         3.50 cm Diastology LVIDs:         2.30 cm LV e' lateral:   9.68 cm/s LV PW:         1.00 cm LV E/e' lateral: 6.1 LV IVS:        0.80 cm  LEFT ATRIUM           Index LA diam:      2.60 cm 1.73 cm/m LA Vol (A2C): 19.5 ml 12.95 ml/m  AORTIC VALVE LVOT Vmax:   62.90 cm/s LVOT Vmean:  44.100 cm/s LVOT VTI:    0.126 m MITRAL VALVE MV Area (PHT): 3.72 cm    SHUNTS MV Decel Time: 204 msec    Systemic VTI: 0.13 m MV E velocity: 58.90 cm/s MV A velocity: 55.50 cm/s MV E/A ratio:  1.06 Oswaldo Milian  MD Electronically signed by Oswaldo Milian MD Signature Date/Time: 09/28/2019/12:08:03 AM    Final    CT HEAD CODE STROKE WO CONTRAST  Result Date: 09/27/2019 CLINICAL DATA:  Code stroke.  Left-sided weakness EXAM: CT HEAD WITHOUT CONTRAST TECHNIQUE: Contiguous axial images were obtained from the base of the skull through the vertex without intravenous contrast. COMPARISON:  CT head 09/17/2019 FINDINGS: Brain: No acute intracranial hemorrhage, mass effect, or edema. Gray-white  differentiation remains preserved. Ventricles are stable in size. No extra-axial fluid collection. Vascular: No hyperdense vessel. Skull: Unremarkable. Sinuses/Orbits: No acute abnormality. Other: Mastoid air cells are clear. ASPECTS ([place] Stroke Program Early CT Score) - Ganglionic level infarction (caudate, lentiform nuclei, internal capsule, insula, M1-M3 cortex): 7 - Supraganglionic infarction (M4-M6 cortex): 3 Total score (0-10 with 10 being normal): 10 IMPRESSION: No acute intracranial hemorrhage or evidence acute infarction. ASPECT score is 10. These results were communicated to Dr. Cheral Marker at 2:32 pmon 7/29/2021by text page via the [facility] messaging system. Electronically Signed   By: Macy Mis M.D.   On: 09/27/2019 14:36   CT Angio Abd/Pel W and/or Wo Contrast  Result Date: 10/08/2019 CLINICAL DATA:  Abdominal pain, nausea, vomiting, GI bleed and difficulty having bowel movements. EXAM: CT ANGIOGRAPHY ABDOMEN AND PELVIS WITH CONTRAST TECHNIQUE: Multidetector CT imaging of the abdomen and pelvis was performed using the standard protocol during bolus administration of intravenous contrast. Multiplanar reconstructed images and MIPs were obtained and reviewed to evaluate the vascular anatomy. CONTRAST:  127mL OMNIPAQUE IOHEXOL 350 MG/ML SOLN COMPARISON:  CT of the abdomen and pelvis on 09/30/2019 FINDINGS: VASCULAR Aorta: Atherosclerosis of the abdominal aorta without evidence of aneurysm or dissection. Celiac: Normally patent. Normally patent branch vessels demonstrating normal branch vessel anatomy. SMA: Normally patent. Renals: Bilateral single renal arteries demonstrate normal patency. IMA: Normally patent. Inflow: Bilateral common iliac arteries demonstrate calcified plaque without evidence of stenosis or aneurysm. Atherosclerosis of bilateral internal iliac arteries. External iliac arteries demonstrate normal patency. Proximal Outflow: Normally patent bilateral common femoral arteries and femoral  bifurcations. Veins: Venous phase imaging demonstrates normal patency of the portal vein, splenic vein, mesenteric veins, IVC, renal veins, iliac veins and common femoral veins. Review of the MIP images confirms the above findings. NON-VASCULAR Lower chest: No acute abnormality. Hepatobiliary: No focal liver abnormality is seen. No gallstones, gallbladder wall thickening, or biliary dilatation. Pancreas: Unremarkable. No pancreatic ductal dilatation or surrounding inflammatory changes. Spleen: Normal in size without focal abnormality. Adrenals/Urinary Tract: Adrenal glands are unremarkable. Kidneys are normal, without renal calculi, focal lesion, or hydronephrosis. Bladder is unremarkable. Stomach/Bowel: New thickening and inflammation of transverse and descending colon since the prior study 8 days ago with surrounding edema and a small amount of free fluid in the left pericolic gutter and dependent pelvis. Findings are consistent with colitis with the most significant thickening at the level of the distal transverse colon and splenic flexure. No evidence of associated colonic perforation or focal abscess. The colon proximal to segment of colitis demonstrates some dilatation. There is no evidence of small-bowel obstruction. The sigmoid colon and rectum are decompressed. Lymphatic: No enlarged abdominal or pelvic lymph nodes. Reproductive: Again noted is a large cyst associated with the left adnexal region measuring up to 8.8 cm and demonstrating simple fluid density internally. No obvious solid enhancing tissue component by CT. However, further characterization with pelvic ultrasound is recommended as well as referral to gynecology as recommended previously as large postmenopausal cysts will need to be followed and further characterized. Other: No hernias  identified. Musculoskeletal: No acute findings. Stable anterolisthesis of L4 on L5. IMPRESSION: 1. No evidence of significant mesenteric arterial occlusive  disease. 2. New thickening and inflammation of the transverse and descending colon since the prior study 8 days ago with surrounding edema and a small amount of free fluid in the left pericolic gutter and dependent pelvis. Findings are consistent with acute colitis. No evidence of associated colonic perforation or focal abscess. The colon proximal to the segment of colitis demonstrates some dilatation. 3. Stable large cyst associated with the left adnexal region measuring up to 8.8 cm and demonstrating simple fluid density internally. However, further characterization with pelvic ultrasound is recommended as well as referral to gynecology as previously recommended, as a large postmenopausal adnexal cyst will need to be followed and further characterized. Electronically Signed   By: Aletta Edouard M.D.   On: 10/08/2019 08:22   DG FLUORO GUIDE LUMBAR PUNCTURE  Result Date: 09/28/2019 CLINICAL DATA:  Left-sided weakness, abnormal MRI EXAM: DIAGNOSTIC LUMBAR PUNCTURE UNDER FLUOROSCOPIC GUIDANCE FLUOROSCOPY TIME:  Fluoroscopy Time:  18 seconds Radiation Exposure Index (if provided by the fluoroscopic device): 1.4 mGy PROCEDURE: Informed consent was obtained from the patient prior to the procedure, including potential complications of headache, allergy, and pain. With the patient prone, the lower back was prepped with Betadine. 1% Lidocaine was used for local anesthesia. Lumbar puncture was performed at the L3-L4 level using a 22 gauge needle with return of clear CSF. 13 ml of CSF were obtained for laboratory studies. The patient tolerated the procedure well and there were no apparent complications. IMPRESSION: Technically successful fluoroscopic guided lumbar puncture. Electronically Signed   By: Macy Mis M.D.   On: 09/28/2019 17:24    (Echo, Carotid, EGD, Colonoscopy, ERCP)    Subjective:  Patient resting in bed in no acute distress anxious to go home still has loose BM but weight decreased compared  to yesterday. Discharge Exam: Vitals:   10/11/19 2127 10/12/19 0553  BP: 118/67 112/64  Pulse: 75 79  Resp: 16 16  Temp: 98.2 F (36.8 C) 98.1 F (36.7 C)  SpO2: 97% 97%   Vitals:   10/11/19 0616 10/11/19 1310 10/11/19 2127 10/12/19 0553  BP: 118/68 112/62 118/67 112/64  Pulse: 91 88 75 79  Resp: 18 16 16 16   Temp: 97.6 F (36.4 C) (!) 97.5 F (36.4 C) 98.2 F (36.8 C) 98.1 F (36.7 C)  TempSrc: Oral Oral Oral Oral  SpO2: 99% 100% 97% 97%  Weight:      Height:        General: Pt is alert, awake, not in acute distress Cardiovascular: RRR, S1/S2 +, no rubs, no gallops Respiratory: CTA bilaterally, no wheezing, no rhonchi Abdominal: Soft, NT, ND, bowel sounds + Extremities: no edema, no cyanosis    The results of significant diagnostics from this hospitalization (including imaging, microbiology, ancillary and laboratory) are listed below for reference.     Microbiology: Recent Results (from the past 240 hour(s))  Blood culture (routine x 2)     Status: None   Collection Time: 10/08/19  7:29 AM   Specimen: Left Antecubital; Blood  Result Value Ref Range Status   Specimen Description   Final    LEFT ANTECUBITAL Performed at [facility], [address] [place], [place] [address]    Special Requests   Final    BOTTLES DRAWN AEROBIC AND ANAEROBIC Blood Culture results may not be optimal due to an inadequate volume of blood received in  culture bottles Performed at [facility], [address] [place], [place] [address]    Culture   Final    NO GROWTH 5 DAYS Performed at [facility], [place] [address] [place], [place] [address]    Report Status 10/13/2019 FINAL  Final  SARS Coronavirus 2 by RT PCR (hospital order, performed in [facility] hospital lab) Nasopharyngeal Nasopharyngeal Swab     Status: None   Collection Time: 10/08/19  7:30 AM   Specimen: Nasopharyngeal Swab  Result Value Ref Range Status   SARS Coronavirus 2 NEGATIVE  NEGATIVE Final    Comment: (NOTE) SARS-CoV-2 target nucleic acids are NOT DETECTED.  The SARS-CoV-2 RNA is generally detectable in upper and lower respiratory specimens during the acute phase of infection. The lowest concentration of SARS-CoV-2 viral copies this assay can detect is 250 copies / mL. A negative result does not preclude SARS-CoV-2 infection and should not be used as the sole basis for treatment or other patient management decisions.  A negative result may occur with improper specimen collection / handling, submission of specimen other than nasopharyngeal swab, presence of viral mutation(s) within the areas targeted by this assay, and inadequate number of viral copies (<250 copies / mL). A negative result must be combined with clinical observations, patient history, and epidemiological information.  Fact Sheet for Patients:   StrictlyIdeas.no  Fact Sheet for Healthcare Providers: BankingDealers.co.za  This test is not yet approved or  cleared by the [place] FDA and has been authorized for detection and/or diagnosis of SARS-CoV-2 by FDA under an Emergency Use Authorization (EUA).  This EUA will remain in effect (meaning this test can be used) for the duration of the COVID-19 declaration under Section 564(b)(1) of the Act, 21 U.S.C. section 360bbb-3(b)(1), unless the authorization is terminated or revoked sooner.  Performed at [facility], [address] [place], [place] [address]   Blood culture (routine x 2)     Status: None   Collection Time: 10/08/19  8:04 AM   Specimen: BLOOD LEFT FOREARM  Result Value Ref Range Status   Specimen Description   Final    BLOOD LEFT FOREARM Performed at [facility], [address] [place], [place] [address]    Special Requests   Final    BOTTLES DRAWN AEROBIC AND ANAEROBIC Blood Culture results may not be optimal due to an inadequate volume of blood  received in culture bottles Performed at [facility], [address] [place], [place] [address]    Culture   Final    NO GROWTH 5 DAYS Performed at [facility], [place] [address], [place], [place] [address]    Report Status 10/13/2019 FINAL  Final  C Difficile Quick Screen w PCR reflex     Status: None   Collection Time: 10/09/19  9:52 AM  Result Value Ref Range Status   C Diff antigen NEGATIVE NEGATIVE Final   C Diff toxin NEGATIVE NEGATIVE Final   C Diff interpretation No C. difficile detected.  Final    Comment: Performed at [facility], [place] [address] [place], [place] [address]  Gastrointestinal Panel by PCR , Stool     Status: Abnormal   Collection Time: 10/09/19  9:52 AM  Result Value Ref Range Status   Campylobacter species NOT DETECTED NOT DETECTED Final   Plesimonas shigelloides NOT DETECTED NOT DETECTED Final   Salmonella species NOT DETECTED NOT DETECTED  Final   Yersinia enterocolitica NOT DETECTED NOT DETECTED Final   Vibrio species NOT DETECTED NOT DETECTED Final   Vibrio cholerae NOT DETECTED NOT DETECTED Final   Enteroaggregative E coli (EAEC) NOT DETECTED NOT DETECTED Final   Enteropathogenic E coli (EPEC) DETECTED (A) NOT DETECTED Final    Comment: RESULT CALLED TO, READ BACK BY AND VERIFIED WITH: HANNAH FRAIDY 10/09/19 1414 KLW    Enterotoxigenic E coli (ETEC) NOT DETECTED NOT DETECTED Final   Shiga like toxin producing E coli (STEC) NOT DETECTED NOT DETECTED Final   Shigella/Enteroinvasive E coli (EIEC) NOT DETECTED NOT DETECTED Final   Cryptosporidium NOT DETECTED NOT DETECTED Final   Cyclospora cayetanensis NOT DETECTED NOT DETECTED Final   Entamoeba histolytica NOT DETECTED NOT DETECTED Final   Giardia lamblia NOT DETECTED NOT DETECTED Final   Adenovirus F40/41 NOT DETECTED NOT DETECTED Final   Astrovirus NOT DETECTED NOT DETECTED Final   Norovirus GI/GII NOT DETECTED NOT DETECTED Final   Rotavirus A NOT DETECTED NOT  DETECTED Final   Sapovirus (I, II, IV, and V) NOT DETECTED NOT DETECTED Final    Comment: Performed at [facility], [address] [place], [place] [address]     Labs: BNP (last 3 results) No results for input(s): BNP in the last 8760 hours. Basic Metabolic Panel: Recent Labs  Lab 10/08/19 0639 10/09/19 0428 10/10/19 0445 10/12/19 0508  NA 142 139 138 142  K 3.9 3.4* 4.2 4.7  CL 105 108 108 108  CO2 23 22 22  21*  GLUCOSE 150* 107* 103* 86  BUN 12 12 7* <5*  CREATININE 0.82 0.70 0.67 0.62  CALCIUM 9.3 7.9* 8.0* 8.6*   Liver Function Tests: Recent Labs  Lab 10/08/19 0639 10/09/19 0428 10/12/19 0508  AST 23 14* 19  ALT 18 13 10   ALKPHOS 144* 109 78  BILITOT 0.2* 0.4 1.1  PROT 7.2 5.4* 5.5*  ALBUMIN 3.2* 2.2* 2.4*   Recent Labs  Lab 10/08/19 0639  LIPASE 21   No results for input(s): AMMONIA in the last 168 hours. CBC: Recent Labs  Lab 10/08/19 0639 10/09/19 0428 10/09/19 1348 10/10/19 0445 10/12/19 0508  WBC 22.4* 17.8* 20.6* 16.1* 11.1*  NEUTROABS 20.9*  --   --   --   --   HGB 12.4 9.2* 9.8* 8.4* 9.5*  HCT 40.1 29.5* 31.2* 26.6* 31.3*  MCV 82.2 83.6 82.5 83.1 84.8  PLT 423* 339 367 300 348   Cardiac Enzymes: No results for input(s): CKTOTAL, CKMB, CKMBINDEX, TROPONINI in the last 168 hours. BNP: Invalid input(s): POCBNP CBG: No results for input(s): GLUCAP in the last 168 hours. D-Dimer No results for input(s): DDIMER in the last 72 hours. Hgb A1c No results for input(s): HGBA1C in the last 72 hours. Lipid Profile No results for input(s): CHOL, HDL, LDLCALC, TRIG, CHOLHDL, LDLDIRECT in the last 72 hours. Thyroid function studies No results for input(s): TSH, T4TOTAL, T3FREE, THYROIDAB in the last 72 hours.  Invalid input(s): FREET3 Anemia work up No results for input(s): VITAMINB12, FOLATE, FERRITIN, TIBC, IRON, RETICCTPCT in the last 72 hours. Urinalysis    Component Value Date/Time   COLORURINE YELLOW 09/27/2019 1800    APPEARANCEUR CLEAR 09/27/2019 1800   LABSPEC >1.046 (H) 09/27/2019 1800   PHURINE 6.0 09/27/2019 1800   GLUCOSEU NEGATIVE 09/27/2019 1800   GLUCOSEU NEGATIVE 09/18/2019 1626   HGBUR NEGATIVE 09/27/2019 1800   BILIRUBINUR NEGATIVE 09/27/2019 1800   BILIRUBINUR negative 05/15/2019 1105   KETONESUR 20 (A) 09/27/2019 1800  PROTEINUR NEGATIVE 09/27/2019 1800   UROBILINOGEN 0.2 09/18/2019 1626   NITRITE NEGATIVE 09/27/2019 1800   LEUKOCYTESUR TRACE (A) 09/27/2019 1800   Sepsis Labs Invalid input(s): PROCALCITONIN,  WBC,  LACTICIDVEN Microbiology Recent Results (from the past 240 hour(s))  Blood culture (routine x 2)     Status: None   Collection Time: 10/08/19  7:29 AM   Specimen: Left Antecubital; Blood  Result Value Ref Range Status   Specimen Description   Final    LEFT ANTECUBITAL Performed at [facility], [address] [place], [place] [address]    Special Requests   Final    BOTTLES DRAWN AEROBIC AND ANAEROBIC Blood Culture results may not be optimal due to an inadequate volume of blood received in culture bottles Performed at [facility], [address] [place], [place] [address]    Culture   Final    NO GROWTH 5 DAYS Performed at [facility], [place] [address] [place], [place] [address]    Report Status 10/13/2019 FINAL  Final  SARS Coronavirus 2 by RT PCR (hospital order, performed in [facility] hospital lab) Nasopharyngeal Nasopharyngeal Swab     Status: None   Collection Time: 10/08/19  7:30 AM   Specimen: Nasopharyngeal Swab  Result Value Ref Range Status   SARS Coronavirus 2 NEGATIVE NEGATIVE Final    Comment: (NOTE) SARS-CoV-2 target nucleic acids are NOT DETECTED.  The SARS-CoV-2 RNA is generally detectable in upper and lower respiratory specimens during the acute phase of infection. The lowest concentration of SARS-CoV-2 viral copies this assay can detect is 250 copies / mL. A negative result does not preclude SARS-CoV-2  infection and should not be used as the sole basis for treatment or other patient management decisions.  A negative result may occur with improper specimen collection / handling, submission of specimen other than nasopharyngeal swab, presence of viral mutation(s) within the areas targeted by this assay, and inadequate number of viral copies (<250 copies / mL). A negative result must be combined with clinical observations, patient history, and epidemiological information.  Fact Sheet for Patients:   StrictlyIdeas.no  Fact Sheet for Healthcare Providers: BankingDealers.co.za  This test is not yet approved or  cleared by the [place] FDA and has been authorized for detection and/or diagnosis of SARS-CoV-2 by FDA under an Emergency Use Authorization (EUA).  This EUA will remain in effect (meaning this test can be used) for the duration of the COVID-19 declaration under Section 564(b)(1) of the Act, 21 U.S.C. section 360bbb-3(b)(1), unless the authorization is terminated or revoked sooner.  Performed at [facility], [address] [place], [place] [address]   Blood culture (routine x 2)     Status: None   Collection Time: 10/08/19  8:04 AM   Specimen: BLOOD LEFT FOREARM  Result Value Ref Range Status   Specimen Description   Final    BLOOD LEFT FOREARM Performed at [facility], [address] [place], [place] [address]    Special Requests   Final    BOTTLES DRAWN AEROBIC AND ANAEROBIC Blood Culture results may not be optimal due to an inadequate volume of blood received in culture bottles Performed at [facility], [address] [place], [place] [address]    Culture   Final    NO GROWTH 5 DAYS Performed at [facility], [place] [address] [place], [place] [address]  Report Status 10/13/2019 FINAL  Final  C Difficile Quick Screen w PCR reflex     Status: None   Collection Time:  10/09/19  9:52 AM  Result Value Ref Range Status   C Diff antigen NEGATIVE NEGATIVE Final   C Diff toxin NEGATIVE NEGATIVE Final   C Diff interpretation No C. difficile detected.  Final    Comment: Performed at [facility], [place] [address], [place], [place] [address]  Gastrointestinal Panel by PCR , Stool     Status: Abnormal   Collection Time: 10/09/19  9:52 AM  Result Value Ref Range Status   Campylobacter species NOT DETECTED NOT DETECTED Final   Plesimonas shigelloides NOT DETECTED NOT DETECTED Final   Salmonella species NOT DETECTED NOT DETECTED Final   Yersinia enterocolitica NOT DETECTED NOT DETECTED Final   Vibrio species NOT DETECTED NOT DETECTED Final   Vibrio cholerae NOT DETECTED NOT DETECTED Final   Enteroaggregative E coli (EAEC) NOT DETECTED NOT DETECTED Final   Enteropathogenic E coli (EPEC) DETECTED (A) NOT DETECTED Final    Comment: RESULT CALLED TO, READ BACK BY AND VERIFIED WITH: HANNAH FRAIDY 10/09/19 1414 KLW    Enterotoxigenic E coli (ETEC) NOT DETECTED NOT DETECTED Final   Shiga like toxin producing E coli (STEC) NOT DETECTED NOT DETECTED Final   Shigella/Enteroinvasive E coli (EIEC) NOT DETECTED NOT DETECTED Final   Cryptosporidium NOT DETECTED NOT DETECTED Final   Cyclospora cayetanensis NOT DETECTED NOT DETECTED Final   Entamoeba histolytica NOT DETECTED NOT DETECTED Final   Giardia lamblia NOT DETECTED NOT DETECTED Final   Adenovirus F40/41 NOT DETECTED NOT DETECTED Final   Astrovirus NOT DETECTED NOT DETECTED Final   Norovirus GI/GII NOT DETECTED NOT DETECTED Final   Rotavirus A NOT DETECTED NOT DETECTED Final   Sapovirus (I, II, IV, and V) NOT DETECTED NOT DETECTED Final    Comment: Performed at [facility], [address] [place], [place] [address]     Time coordinating discharge: 39 minutes  SIGNED:   Georgette Shell, MD  [facility] 10/13/2019, 12:55 PM Pager   If 7PM-7AM, please contact  night-coverage [email or web address] Password [email or web address]

## 2019-10-15 ENCOUNTER — Other Ambulatory Visit: Payer: Self-pay

## 2019-10-15 ENCOUNTER — Telehealth: Payer: Self-pay | Admitting: Internal Medicine

## 2019-10-15 ENCOUNTER — Other Ambulatory Visit: Payer: Self-pay | Admitting: Family Medicine

## 2019-10-15 MED ORDER — VANCOMYCIN HCL 125 MG PO CAPS: 125.0000 mg | ORAL_CAPSULE | Freq: Four times a day (QID) | ORAL | 0 refills | Status: DC

## 2019-10-15 NOTE — Telephone Encounter (Signed)
Pt is wanting to inform a nurse she has been without her antibiotics.

## 2019-10-15 NOTE — Telephone Encounter (Signed)
Patient was discharged from the hospital 10/12/19. She was to be on Vancomycin 125 mg prophylaxis for 7 days. History of C Diff infection. Patient was unable to get the vancomycin suspension filled. Not available at her local pharmacies. I changed her prescription to Vanco 125 mg capsules to take 4 times daily for 7 days. Confirmed [facility] and notified the patient.

## 2019-10-15 NOTE — Patient Outreach (Signed)
[facility]) Care Management  10/15/2019  Danielle Fox May 19, 1950 [id number]   Red emmi:  Date of red emmi:  10/14/2019 Reason for alert:  Questions about discharge papers- yes                              Scheduled follow up---no                              Unfilled rx- yes  Reviewed medical record.  Review MD transition of care note.  RX called in for pill form Vancomycin.   Placed call to patient with no answer.   PLAN: left a message requesting a call back.  Tomasa Rand, RN, BSN, CEN [facility] [employer] [phone]

## 2019-10-15 NOTE — Telephone Encounter (Signed)
Spoke with patient, she was discharged from the hospital on Friday. Vancomycin was sent in for her by the hospitalitis which her pharmacy is unable to get. She tried to reach out to her Primary care office which is out of the office on Friday. She states that she is trying have this change to capsules so that she can get started on her antibiotics. She was followed by Nevin Bloodgood in the hospital.

## 2019-10-15 NOTE — Telephone Encounter (Signed)
Called the patient and she stated as of today she still does not have it, but is to be coming in today at [facility] at any time now.  She will let us know if there continues to be a problem getting it, but should be today.

## 2019-10-16 ENCOUNTER — Telehealth: Payer: Self-pay | Admitting: Internal Medicine

## 2019-10-16 NOTE — Telephone Encounter (Signed)
Patient is on Vancomycin. She is experiencing extreme nausea. Taking Ondansetron 4 mg every 8 hours. States this is just not enough. She is "miserable." "I can't even get enough relief so I can eat. Please advise Thanks

## 2019-10-16 NOTE — Telephone Encounter (Signed)
I called the patient know in my experience vancomycin does not cause nausea she thinks that is what the issue is and since its a prophylactic treatment I have told her to stop it and we will see how she does.

## 2019-10-18 ENCOUNTER — Other Ambulatory Visit: Payer: Self-pay

## 2019-10-18 ENCOUNTER — Telehealth: Payer: Self-pay | Admitting: Internal Medicine

## 2019-10-18 MED ORDER — PROMETHAZINE HCL 12.5 MG PO TABS
12.5000 mg | ORAL_TABLET | Freq: Four times a day (QID) | ORAL | 0 refills | Status: DC | PRN
Start: 2019-10-18 — End: 2019-10-26

## 2019-10-18 NOTE — Telephone Encounter (Signed)
I am sorry to hear that she is still having issues. Recommend transition to Phenergan or Compazine. Let her know that Phenergan may cause more drowsiness so that she can decide.  Phenergan 12.5 mg Q6H PRN can be used or Compazine 10 mg Q8H PRN can be used.  If not able to tolerate anything by mouth then can consider a rectal suppository.  If still having significant issues then will require ED evaluation.  Thanks. GM

## 2019-10-18 NOTE — Telephone Encounter (Signed)
Spoke with the patient. She is agreeable to Phenergan. [facility]. She will focus on maintaining hydration.  Check back in tomorrow.

## 2019-10-18 NOTE — Patient Outreach (Signed)
[facility]) Care Management  10/18/2019  SUMMERLYNN GLAUSER 09-29-1950 [id number]   Outreach attempt number 2:  Placed call to patient and explained reason for call.  Patient was upset and said "you are too late"  Reports she has been talking to her doctor and did not need my help. Hung up the phone.  PLAN: Close case.  Tomasa Rand, RN, BSN, CEN [facility] [employer] [phone]

## 2019-10-18 NOTE — Telephone Encounter (Signed)
Doc of the day  Danielle Fox of Dr Celesta Aver. She was recently hospitalized for acute colitis-stool cultures positive for enterotoxigenic E. Coli. Treated with Cipro. She was discharged 10/13/19 on Vancomycin to prevent C Diff recurrence.  She called with extreme nausea, no vomiting on 8/17/21and was told to stop the Vancomycin. Danielle Fox calls today with c/o continuing extreme nausea. She is forcing liquids, toast and applesauce and tolerating without vomiting. She feels a "pressure point" or a "knot" in her stomach. History of pyloric stenosis. Last endoscopy 03/16/19 where she did have dilation. She is "miserable."  Last took Zofran 4 mg about an hour agao. She said this does not help.

## 2019-10-19 ENCOUNTER — Inpatient Hospital Stay: Payer: Medicare Other | Admitting: Medical

## 2019-10-19 NOTE — Telephone Encounter (Signed)
Poke with the patient. She is getting relief with the Phenergan. Not nauseated, but actually hungry now. She will advance her diet slowly. She will call us if she fails to improve or she acutely worsens.

## 2019-10-25 NOTE — Telephone Encounter (Signed)
Patient reports severe nausea.  Not able to eat.  Has lost 20 lbs.  She will come in and see Alonza Bogus, PA tomorrow at 8:30

## 2019-10-25 NOTE — Telephone Encounter (Signed)
Calling back to advice that she feels Phenergan is not helping and feels terrible.

## 2019-10-26 ENCOUNTER — Ambulatory Visit (INDEPENDENT_AMBULATORY_CARE_PROVIDER_SITE_OTHER): Payer: Medicare Other | Admitting: Gastroenterology

## 2019-10-26 ENCOUNTER — Telehealth: Payer: Self-pay | Admitting: Gastroenterology

## 2019-10-26 ENCOUNTER — Other Ambulatory Visit (INDEPENDENT_AMBULATORY_CARE_PROVIDER_SITE_OTHER): Payer: Medicare Other

## 2019-10-26 ENCOUNTER — Encounter: Payer: Self-pay | Admitting: Gastroenterology

## 2019-10-26 ENCOUNTER — Other Ambulatory Visit: Payer: Self-pay

## 2019-10-26 VITALS — BP 86/60 | HR 96 | Ht 61.0 in | Wt 103.2 lb

## 2019-10-26 DIAGNOSIS — R197 Diarrhea, unspecified: Secondary | ICD-10-CM | POA: Diagnosis not present

## 2019-10-26 DIAGNOSIS — R112 Nausea with vomiting, unspecified: Secondary | ICD-10-CM

## 2019-10-26 DIAGNOSIS — E876 Hypokalemia: Secondary | ICD-10-CM

## 2019-10-26 LAB — CBC WITH DIFFERENTIAL/PLATELET
Basophils Absolute: 0.1 10*3/uL (ref 0.0–0.1)
Basophils Relative: 0.5 % (ref 0.0–3.0)
Eosinophils Absolute: 0.2 10*3/uL (ref 0.0–0.7)
Eosinophils Relative: 1.4 % (ref 0.0–5.0)
HCT: 36.1 % (ref 36.0–46.0)
Hemoglobin: 11.7 g/dL — ABNORMAL LOW (ref 12.0–15.0)
Lymphocytes Relative: 10.9 % — ABNORMAL LOW (ref 12.0–46.0)
Lymphs Abs: 1.2 10*3/uL (ref 0.7–4.0)
MCHC: 32.5 g/dL (ref 30.0–36.0)
MCV: 77.3 fl — ABNORMAL LOW (ref 78.0–100.0)
Monocytes Absolute: 1.2 10*3/uL — ABNORMAL HIGH (ref 0.1–1.0)
Monocytes Relative: 10.6 % (ref 3.0–12.0)
Neutro Abs: 8.8 10*3/uL — ABNORMAL HIGH (ref 1.4–7.7)
Neutrophils Relative %: 76.6 % (ref 43.0–77.0)
Platelets: 550 10*3/uL — ABNORMAL HIGH (ref 150.0–400.0)
RBC: 4.67 Mil/uL (ref 3.87–5.11)
RDW: 16 % — ABNORMAL HIGH (ref 11.5–15.5)
WBC: 11.4 10*3/uL — ABNORMAL HIGH (ref 4.0–10.5)

## 2019-10-26 LAB — BASIC METABOLIC PANEL
BUN: 6 mg/dL (ref 6–23)
CO2: 29 mEq/L (ref 19–32)
Calcium: 9.6 mg/dL (ref 8.4–10.5)
Chloride: 96 mEq/L (ref 96–112)
Creatinine, Ser: 0.76 mg/dL (ref 0.40–1.20)
GFR: 75.39 mL/min (ref >60.00)
Glucose, Bld: 111 mg/dL — ABNORMAL HIGH (ref 70–99)
Potassium: 3 mEq/L — ABNORMAL LOW (ref 3.5–5.1)
Sodium: 136 mEq/L (ref 135–145)

## 2019-10-26 MED ORDER — PROMETHAZINE HCL 12.5 MG RE SUPP: 12.5000 mg | Freq: Four times a day (QID) | RECTAL | 0 refills | Status: DC | PRN

## 2019-10-26 NOTE — Progress Notes (Signed)
10/26/2019 Danielle Fox [id number] 22-Jun-1950   HISTORY OF PRESENT ILLNESS: This is a 69 year old female is a patient of Dr. Celesta Aver. She was recently hospitalized for colitis that was suspected to be ischemic, but then stool studies returned positive for enteropathogenic E. coli. At the time of her hospital discharge on August 13 she was improving. She comes in today, however, with complaints of feeling poorly. She states that she is "sicker than the dog". She says that she feels like that she could just curl up and croak. She has constant nausea and has been unable to take anything by mouth except for water and ginger ale. She is not taking any of her medicines except for her antiemetics. She is using Phenergan, but says that it is not helping. She continues to lose weight. She continues to have diarrhea as well. She denies any abdominal pain or rectal bleeding. Having about 3-4 loose stools per day despite taking very little by mouth.   Past Medical History:  Diagnosis Date  . Allergy   . Clostridioides difficile infection 10/19/2018   tested 10/2018 negative  . Colon polyps   . Constipation 10/06/2014  . COPD (chronic obstructive pulmonary disease) ([place])   . Osteopenia   . 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 Preventative health care 06/29/2016  . Pruritus 06/29/2016  . Pyloric stenosis in adult 05/2019  . Welcome to Medicare preventive visit 06/29/2016   Past Surgical History:  Procedure Laterality Date  . APPENDECTOMY    . COLONOSCOPY     01-14-2005  . SINUS IRRIGATION  04/02/2015  . UPPER GASTROINTESTINAL ENDOSCOPY  11/22/2018    reports that she quit smoking about 5 years ago. Her smoking use included cigarettes. She has a 48.00 pack-year smoking history. She has never used smokeless tobacco. She reports current alcohol use. She reports that she does not use drugs. family history includes Alcohol abuse in her father; Cirrhosis in her father; Heart disease in her father  and mother. Allergies  Allergen Reactions  . Doxycycline Nausea And Vomiting  . Erythromycin Base     Other reaction(s): GI Upset (intolerance)      Outpatient Encounter Medications as of 10/26/2019  Medication Sig  . promethazine (PHENERGAN) 12.5 MG tablet Take 1 tablet (12.5 mg total) by mouth every 6 (six) hours as needed for nausea or vomiting.  Marland Kitchen acetaminophen (TYLENOL) 325 MG tablet Take 650 mg by mouth every 6 (six) hours as needed for mild pain. (Patient not taking: Reported on 10/26/2019)  . budesonide-formoterol (SYMBICORT) 80-4.5 MCG/ACT inhaler Inhale 2 puffs into the lungs in the morning and at bedtime. (Patient not taking: Reported on 10/26/2019)  . clopidogrel (PLAVIX) 75 MG tablet Take 1 tablet (75 mg total) by mouth daily. (Patient not taking: Reported on 10/26/2019)  . dicyclomine (BENTYL) 10 MG capsule Take 1 capsule (10 mg total) by mouth 4 (four) times daily -  before meals and at bedtime. (Patient not taking: Reported on 10/26/2019)  . esomeprazole (NEXIUM) 40 MG capsule Take 1 capsule (40 mg total) by mouth daily before breakfast. (Patient not taking: Reported on 10/26/2019)  . gabapentin (NEURONTIN) 100 MG capsule Take 3 capsules (300 mg total) by mouth 3 (three) times daily. (Patient not taking: Reported on 10/26/2019)  . levETIRAcetam (KEPPRA) 500 MG tablet Take 1 tablet (500 mg total) by mouth 2 (two) times daily. (Patient not taking: Reported on 10/26/2019)  . saccharomyces boulardii (FLORASTOR) 250 MG capsule Take  1 capsule (250 mg total) by mouth 2 (two) times daily. (Patient not taking: Reported on 10/26/2019)  . [DISCONTINUED] ondansetron (ZOFRAN) 4 MG tablet Take 1 tablet (4 mg total) by mouth every 8 (eight) hours as needed for nausea or vomiting. (Patient not taking: Reported on 10/26/2019)  . [DISCONTINUED] vancomycin (VANCOCIN) 125 MG capsule Take 1 capsule (125 mg total) by mouth 4 (four) times daily. (Patient not taking: Reported on 10/26/2019)   No  facility-administered encounter medications on file as of 10/26/2019.    REVIEW OF SYSTEMS  : All other systems reviewed and negative except where noted in the History of Present Illness.   PHYSICAL EXAM: BP (!) 86/60 (BP Location: Right Arm, Patient Position: Sitting, Cuff Size: Normal)   Pulse 96   Ht 5\' 1"  (1.549 m)   Wt 103 lb 4 oz (46.8 kg)   SpO2 98%   BMI 19.51 kg/m  General: Well developed white female in no acute distress Head: Normocephalic and atraumatic Eyes:  Sclerae anicteric, conjunctiva pink. Ears: Normal auditory acuity Lungs: Clear throughout to auscultation; no W/R/R. Heart: Regular rate and rhythm; no M/R/G. Abdomen: Soft, non-distended.  BS present.  Non-tender. Musculoskeletal: Symmetrical with no gross deformities  Skin: No lesions on visible extremities Extremities: No edema  Neurological: Alert oriented x 4, grossly non-focal Psychological:  Alert and cooperative. Normal mood and affect  ASSESSMENT AND PLAN: 69 yo female with PMH significant for but not limited toosteopenia, pre-diabetes,questionable stroke7/17/2021,COPD,positive TB skin test,pyloricstenosis, C. difficile 09/2018, colonic stricture who was recently hospitalized for jematochezia/colitis on CT scan. Presentation suspicious for ischemic colitis though stool studies + for Enteropathogenic E.coli. Presents here today with complaints of severe nausea, inability to eat or take anything p.o. besides water and ginger ale with continued weight loss, and ongoing diarrhea. This is all despite Phenergan antiemetics. At this point she is failing outpatient management and not sure why she has failed to improve. I have advised her and her sister that I think the best thing would be for her to be evaluated in the emergency department. She really does not want to have to go that route, but she will if necessary. We will check stat CBC and BMP today. If she appears dehydrated or electrolytes are off then advise  that she should present to the ED. We will also repeat stool studies. I will send Phenergan suppositories to her pharmacy in case we are able to or she chooses to try to manage this at home further.  CC:  Mosie Lukes, MD

## 2019-10-26 NOTE — Patient Instructions (Addendum)
If you are age 69 or older, your body mass index should be between 23-30. Your Body mass index is 19.51 kg/m. If this is out of the aforementioned range listed, please consider follow up with your Primary Care Provider.  If you are age 49 or younger, your body mass index should be between 19-25. Your Body mass index is 19.51 kg/m. If this is out of the aformentioned range listed, please consider follow up with your Primary Care Provider.   Your provider has requested that you go to the basement level for lab work before leaving today. Press "B" on the elevator. The lab is located at the first door on the left as you exit the elevator.  We have sent the following medications to your pharmacy for you to pick up at your convenience: Phenergan 12.5 mg suppositories every 6 hours as needed.   Restart Nexium 40 mg daily.

## 2019-10-27 ENCOUNTER — Ambulatory Visit
Admission: RE | Admit: 2019-10-27 | Discharge: 2019-10-27 | Disposition: A | Discharge status: Home / Self Care | Payer: Medicare Other | Source: Ambulatory Visit | Attending: Neurology | Admitting: Neurology

## 2019-10-27 ENCOUNTER — Other Ambulatory Visit: Payer: Self-pay

## 2019-10-27 DIAGNOSIS — M359 Systemic involvement of connective tissue, unspecified: Secondary | ICD-10-CM

## 2019-10-27 DIAGNOSIS — G053 Encephalitis and encephalomyelitis in diseases classified elsewhere: Secondary | ICD-10-CM

## 2019-10-27 DIAGNOSIS — M5124 Other intervertebral disc displacement, thoracic region: Secondary | ICD-10-CM | POA: Diagnosis not present

## 2019-10-27 DIAGNOSIS — M4802 Spinal stenosis, cervical region: Secondary | ICD-10-CM | POA: Diagnosis not present

## 2019-10-27 DIAGNOSIS — M50221 Other cervical disc displacement at C4-C5 level: Secondary | ICD-10-CM | POA: Diagnosis not present

## 2019-10-27 DIAGNOSIS — M47812 Spondylosis without myelopathy or radiculopathy, cervical region: Secondary | ICD-10-CM | POA: Diagnosis not present

## 2019-10-27 MED ORDER — GADOBENATE DIMEGLUMINE 529 MG/ML IV SOLN
10.0000 mL | Freq: Once | INTRAVENOUS | Status: AC | PRN
  Administered 2019-10-27: 10 mL via INTRAVENOUS

## 2019-10-29 ENCOUNTER — Telehealth: Payer: Self-pay | Admitting: Neurology

## 2019-10-29 ENCOUNTER — Ambulatory Visit
Admission: RE | Admit: 2019-10-29 | Discharge: 2019-10-29 | Disposition: A | Discharge status: Home / Self Care | Payer: Medicare Other | Source: Ambulatory Visit | Attending: Neurology | Admitting: Neurology

## 2019-10-29 ENCOUNTER — Other Ambulatory Visit: Payer: Self-pay

## 2019-10-29 ENCOUNTER — Other Ambulatory Visit (INDEPENDENT_AMBULATORY_CARE_PROVIDER_SITE_OTHER): Payer: Medicare Other

## 2019-10-29 ENCOUNTER — Other Ambulatory Visit: Payer: Self-pay | Admitting: Gastroenterology

## 2019-10-29 DIAGNOSIS — J3489 Other specified disorders of nose and nasal sinuses: Secondary | ICD-10-CM | POA: Diagnosis not present

## 2019-10-29 DIAGNOSIS — E876 Hypokalemia: Secondary | ICD-10-CM

## 2019-10-29 DIAGNOSIS — R531 Weakness: Secondary | ICD-10-CM | POA: Diagnosis not present

## 2019-10-29 DIAGNOSIS — M359 Systemic involvement of connective tissue, unspecified: Secondary | ICD-10-CM

## 2019-10-29 DIAGNOSIS — Q283 Other malformations of cerebral vessels: Secondary | ICD-10-CM | POA: Diagnosis not present

## 2019-10-29 DIAGNOSIS — G053 Encephalitis and encephalomyelitis in diseases classified elsewhere: Secondary | ICD-10-CM

## 2019-10-29 DIAGNOSIS — G9389 Other specified disorders of brain: Secondary | ICD-10-CM | POA: Diagnosis not present

## 2019-10-29 LAB — GI PROFILE, STOOL, PCR

## 2019-10-29 LAB — BASIC METABOLIC PANEL
BUN: 6 mg/dL (ref 6–23)
CO2: 26 mEq/L (ref 19–32)
Calcium: 9.2 mg/dL (ref 8.4–10.5)
Chloride: 99 mEq/L (ref 96–112)
Creatinine, Ser: 0.53 mg/dL (ref 0.40–1.20)
GFR: 114.27 mL/min (ref >60.00)
Glucose, Bld: 105 mg/dL — ABNORMAL HIGH (ref 70–99)
Potassium: 3.2 mEq/L — ABNORMAL LOW (ref 3.5–5.1)
Sodium: 136 mEq/L (ref 135–145)

## 2019-10-29 LAB — FUNGUS CULTURE WITH STAIN

## 2019-10-29 LAB — FUNGAL ORGANISM REFLEX

## 2019-10-29 LAB — FUNGUS CULTURE RESULT

## 2019-10-29 MED ORDER — GADOBENATE DIMEGLUMINE 529 MG/ML IV SOLN
9.0000 mL | Freq: Once | INTRAVENOUS | Status: AC | PRN
  Administered 2019-10-29: 9 mL via INTRAVENOUS

## 2019-10-29 NOTE — Telephone Encounter (Signed)
Patient called and said her episodes on the left side are becoming more frequent. She'd like to speak with a nurse about her medications.

## 2019-10-29 NOTE — Telephone Encounter (Signed)
I see no explanation on MRI of spinal cord.  I want to wait and see what the brain MRI shows.

## 2019-10-29 NOTE — Telephone Encounter (Signed)
Do you know anything about this? 

## 2019-10-29 NOTE — Telephone Encounter (Signed)
The pt has been advised that she does not need any imaging at this point.  She feels better and has no complaints at this time.  She will call if symptoms return

## 2019-10-29 NOTE — Telephone Encounter (Signed)
Looks like Claiborne Billings and Vaughan Basta was handling this. Look under lab results for more details.

## 2019-10-30 ENCOUNTER — Telehealth: Payer: Self-pay | Admitting: Neurology

## 2019-10-30 ENCOUNTER — Other Ambulatory Visit: Payer: Self-pay

## 2019-10-30 DIAGNOSIS — G053 Encephalitis and encephalomyelitis in diseases classified elsewhere: Secondary | ICD-10-CM

## 2019-10-30 DIAGNOSIS — M359 Systemic involvement of connective tissue, unspecified: Secondary | ICD-10-CM

## 2019-10-30 NOTE — Telephone Encounter (Signed)
I called Danielle Fox and discussed MRI results.  There is some progression on MRI.  She reports worsening weakness and numbness on the left side.  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    1.  I would like to treat symptoms with Solumedrol 1000mg  IV daily for 5 days. 2.  I would like to check bloodwork:  NMDA antibodies, voltage-gated potassium channel antibodies 3.  I would like to repeat lumbar puncture, checking CSF cell count, protein, glucose, culture, HSV 1&2, VZV PCR, paraneoplastic panel, GFAP antibodies, cytology and flow cytometry.  Also request to safe some fluid in case we wish to add further labs.  Patient in agreement of the plan.  I answered all questions to her satisfaction and best of my ability.

## 2019-10-30 NOTE — Progress Notes (Signed)
I called Danielle Fox and discussed MRI results.  There is some progression on MRI.  She reports worsening weakness and numbness on the left side.  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    1.  I would like to treat symptoms with Solumedrol 1000mg  IV daily for 5 days. 2.  I would like to check bloodwork:  NMDA antibodies, voltage-gated potassium channel antibodies 3.  I would like to repeat lumbar puncture, checking CSF cell count, protein, glucose, culture, HSV 1&2, VZV PCR, paraneoplastic panel, GFAP antibodies, cytology and flow cytometry.  Also request to safe some fluid in case we wish to add further labs.  Patient in agreement of the plan.  I answered all questions to her satisfaction and best of my ability.

## 2019-10-30 NOTE — Telephone Encounter (Signed)
Reviewed MRI of brain.  I called and spoke with patient.  Please refer to telephone encounter for plan.

## 2019-10-31 ENCOUNTER — Other Ambulatory Visit: Payer: Self-pay

## 2019-10-31 NOTE — Progress Notes (Signed)
Spoke to [facility] at [facility]. Will fax over order for Solumedrol 1000mg  IV daily for 5 days. For Dr. Tomi Likens to sign and fax back.   Pt sto start infusion 11/01/19 at 12pm

## 2019-11-01 ENCOUNTER — Other Ambulatory Visit: Payer: Self-pay | Admitting: Gastroenterology

## 2019-11-01 ENCOUNTER — Other Ambulatory Visit: Payer: Self-pay

## 2019-11-01 ENCOUNTER — Ambulatory Visit: Payer: Medicare Other | Admitting: Nurse Practitioner

## 2019-11-01 MED ORDER — PROMETHAZINE HCL 12.5 MG RE SUPP: 12.5000 mg | Freq: Four times a day (QID) | RECTAL | 1 refills | Status: DC | PRN

## 2019-11-01 NOTE — Progress Notes (Signed)
Solumedrol 1000 mg

## 2019-11-02 DIAGNOSIS — G053 Encephalitis and encephalomyelitis in diseases classified elsewhere: Secondary | ICD-10-CM | POA: Diagnosis not present

## 2019-11-02 DIAGNOSIS — M359 Systemic involvement of connective tissue, unspecified: Secondary | ICD-10-CM | POA: Diagnosis not present

## 2019-11-06 ENCOUNTER — Telehealth: Payer: Self-pay | Admitting: Family Medicine

## 2019-11-06 DIAGNOSIS — M359 Systemic involvement of connective tissue, unspecified: Secondary | ICD-10-CM | POA: Diagnosis not present

## 2019-11-06 DIAGNOSIS — G053 Encephalitis and encephalomyelitis in diseases classified elsewhere: Secondary | ICD-10-CM | POA: Diagnosis not present

## 2019-11-06 NOTE — Telephone Encounter (Signed)
Patient states she finished steroid  Treatment with Dr Tomi Likens  And  Her feet and ankle are swollen.   Please Advise

## 2019-11-07 ENCOUNTER — Other Ambulatory Visit: Payer: Self-pay | Admitting: Family Medicine

## 2019-11-07 ENCOUNTER — Ambulatory Visit: Payer: Medicare Other | Admitting: Nurse Practitioner

## 2019-11-07 ENCOUNTER — Telehealth: Payer: Self-pay | Admitting: Neurology

## 2019-11-07 MED ORDER — FUROSEMIDE 20 MG PO TABS: 20.0000 mg | ORAL_TABLET | Freq: Every day | ORAL | 0 refills | Status: DC | PRN

## 2019-11-07 MED ORDER — POTASSIUM CHLORIDE CRYS ER 20 MEQ PO TBCR: 20.0000 meq | EXTENDED_RELEASE_TABLET | Freq: Every day | ORAL | 0 refills | Status: DC | PRN

## 2019-11-07 NOTE — Telephone Encounter (Signed)
It should pass since the steroids are finished.

## 2019-11-07 NOTE — Progress Notes (Unsigned)
lasix °

## 2019-11-07 NOTE — Telephone Encounter (Signed)
[facility] 11/06/19 @ 6:58pm:  "Caller states she just finished her steroid infusions and her ankles are swollen. She is also having a bit of head fogginess."

## 2019-11-07 NOTE — Telephone Encounter (Signed)
Let her know I sent in Lasix and Potassium she can take 1 tab po each for the next 3 days then as needed for edema/swelling but because her potassium was low at last blood check she has to take a potassium with each Lasix, recheck CMP next week. Also minimize sodium in diet and elevate feet above heart for 15 minutes tid.

## 2019-11-08 ENCOUNTER — Other Ambulatory Visit (HOSPITAL_COMMUNITY)
Admission: RE | Admit: 2019-11-08 | Discharge: 2019-11-08 | Disposition: A | Discharge status: Home / Self Care | Payer: Medicare Other | Source: Ambulatory Visit | Attending: Neurology | Admitting: Neurology

## 2019-11-08 ENCOUNTER — Other Ambulatory Visit: Payer: Self-pay

## 2019-11-08 ENCOUNTER — Ambulatory Visit
Admission: RE | Admit: 2019-11-08 | Discharge: 2019-11-08 | Disposition: A | Discharge status: Home / Self Care | Payer: Medicare Other | Source: Ambulatory Visit | Attending: Neurology | Admitting: Neurology

## 2019-11-08 ENCOUNTER — Ambulatory Visit: Payer: Medicare Other | Admitting: Internal Medicine

## 2019-11-08 ENCOUNTER — Telehealth: Payer: Self-pay

## 2019-11-08 VITALS — BP 114/54 | HR 63

## 2019-11-08 DIAGNOSIS — G053 Encephalitis and encephalomyelitis in diseases classified elsewhere: Secondary | ICD-10-CM | POA: Diagnosis not present

## 2019-11-08 DIAGNOSIS — G039 Meningitis, unspecified: Secondary | ICD-10-CM | POA: Insufficient documentation

## 2019-11-08 DIAGNOSIS — M359 Systemic involvement of connective tissue, unspecified: Secondary | ICD-10-CM | POA: Insufficient documentation

## 2019-11-08 NOTE — Telephone Encounter (Signed)
Pt state the gabapentin making her sleepy and not helping with her hip pain. Pt also wanted to know her LP results  Pt advised of DR.Jaffe note on her ankles swelling. Pt wanted to know what could she do until they go down? Advised to give [place] a call if the swelling  doesn't subside. Advised pt we will try and get back to her tomorrow with her LP results. But it could be later due to dr. Tomi Likens having procedures all day.

## 2019-11-08 NOTE — Discharge Instructions (Signed)

## 2019-11-08 NOTE — Telephone Encounter (Signed)
Patient called back about this concern, swollen ankles and hurting hips after steroid injections. She's requesting a call back this afternoon since she has a lumbar puncture scheduled this morning.

## 2019-11-08 NOTE — Telephone Encounter (Signed)
Per Pt the Gabapentin was written by the ED provider.So what should she do? Advised pt of labs may not all come back at the same time.

## 2019-11-08 NOTE — Telephone Encounter (Signed)
Follow up with PCP

## 2019-11-08 NOTE — Telephone Encounter (Signed)
I don't prescribe her gabapentin.  She needs to discuss that with her prescribing physician.  We can get back to her with available CSF results tomorrow, but not all of the labs will be back.  It may take a week or so as there are send-out labs.

## 2019-11-08 NOTE — Telephone Encounter (Signed)
Left message on machine to call back  

## 2019-11-08 NOTE — Telephone Encounter (Signed)
LMOVM

## 2019-11-09 LAB — CYTOLOGY - NON PAP

## 2019-11-09 NOTE — Telephone Encounter (Signed)
Patient  has been notified of prescription sent into pharmacy and instructed as per provider and will call back when done and make a follow-up appt. For repeat cmp.

## 2019-11-09 NOTE — Telephone Encounter (Signed)
Advised pt to f/u with her PCP

## 2019-11-09 NOTE — Telephone Encounter (Signed)
LMOVM

## 2019-11-09 NOTE — Telephone Encounter (Signed)
Patient returned call to [facility] but note was already signed.

## 2019-11-12 ENCOUNTER — Telehealth: Payer: Self-pay | Admitting: Neurology

## 2019-11-12 ENCOUNTER — Telehealth: Payer: Self-pay | Admitting: *Deleted

## 2019-11-12 ENCOUNTER — Telehealth: Payer: Self-pay | Admitting: Family Medicine

## 2019-11-12 LAB — ACID FAST CULTURE WITH REFLEXED SENSITIVITIES (MYCOBACTERIA): Acid Fast Culture: NEGATIVE

## 2019-11-12 NOTE — Telephone Encounter (Signed)
Patient called concerning her medications. She was not sure if she should still be taking her gabapentin and dicyclomine.  Patient has an appt set up for Thursday.

## 2019-11-12 NOTE — Telephone Encounter (Signed)
Error

## 2019-11-12 NOTE — Telephone Encounter (Signed)
Advised pt of labs results we have so far. Advised pt of last note 09/2019 of Keppra.   Advised pt we will call her for her further results.

## 2019-11-12 NOTE — Telephone Encounter (Signed)
So far labs look okay.  But a lot of the labs are not available yet as they are send out tests.  We will contact patient when rest of labs become available.

## 2019-11-12 NOTE — Telephone Encounter (Signed)
Patient left a VM wanting to know her test results please call

## 2019-11-13 LAB — MAYO MISC ORDER 2

## 2019-11-14 LAB — VOLTAGE-GATED POTASSIUM CHANNEL (VGKC) ANTIBODY, CSF: Voltage Gated Potassium Channel (VGKC)AB,CSF: <20 pmol/L (ref <20)

## 2019-11-15 ENCOUNTER — Ambulatory Visit (INDEPENDENT_AMBULATORY_CARE_PROVIDER_SITE_OTHER): Payer: Medicare Other | Admitting: Family Medicine

## 2019-11-15 ENCOUNTER — Other Ambulatory Visit: Payer: Self-pay

## 2019-11-15 VITALS — BP 87/65 | HR 93 | Temp 97.7°F | Resp 12 | Ht 61.0 in | Wt 104.2 lb

## 2019-11-15 DIAGNOSIS — C7949 Secondary malignant neoplasm of other parts of nervous system: Secondary | ICD-10-CM | POA: Diagnosis not present

## 2019-11-15 DIAGNOSIS — C801 Malignant (primary) neoplasm, unspecified: Secondary | ICD-10-CM

## 2019-11-15 DIAGNOSIS — Z8673 Personal history of transient ischemic attack (TIA), and cerebral infarction without residual deficits: Secondary | ICD-10-CM | POA: Diagnosis not present

## 2019-11-15 DIAGNOSIS — G039 Meningitis, unspecified: Secondary | ICD-10-CM

## 2019-11-15 DIAGNOSIS — A498 Other bacterial infections of unspecified site: Secondary | ICD-10-CM

## 2019-11-15 DIAGNOSIS — M359 Systemic involvement of connective tissue, unspecified: Secondary | ICD-10-CM

## 2019-11-15 DIAGNOSIS — E876 Hypokalemia: Secondary | ICD-10-CM | POA: Diagnosis not present

## 2019-11-15 DIAGNOSIS — E559 Vitamin D deficiency, unspecified: Secondary | ICD-10-CM

## 2019-11-15 DIAGNOSIS — M549 Dorsalgia, unspecified: Secondary | ICD-10-CM

## 2019-11-15 DIAGNOSIS — G053 Encephalitis and encephalomyelitis in diseases classified elsewhere: Secondary | ICD-10-CM

## 2019-11-15 NOTE — Patient Instructions (Signed)
MIND diet will help the inflammation  Encouraged increased hydration and fiber in diet. Daily probiotics. If bowels not moving can use MOM 2 tbls po in 4 oz of warm prune juice by mouth every 2-3 days. If no results then repeat in 4 hours with  Dulcolax suppository pr, may repeat again in 4 more hours as needed. Seek care if symptoms worsen. Consider daily Miralax and/or Dulcolax if symptoms persist.   Miralax and Benefiber together once daily and titrate for constipation Constipation, Adult Constipation is when a person has fewer bowel movements in a week than normal, has difficulty having a bowel movement, or has stools that are dry, hard, or larger than normal. Constipation may be caused by an underlying condition. It may become worse with age if a person takes certain medicines and does not take in enough fluids. Follow these instructions at home: Eating and drinking   Eat foods that have a lot of fiber, such as fresh fruits and vegetables, whole grains, and beans.  Limit foods that are high in fat, low in fiber, or overly processed, such as french fries, hamburgers, cookies, candies, and soda.  Drink enough fluid to keep your urine clear or pale yellow. General instructions  Exercise regularly or as told by your health care provider.  Go to the restroom when you have the urge to go. Do not hold it in.  Take over-the-counter and prescription medicines only as told by your health care provider. These include any fiber supplements.  Practice pelvic floor retraining exercises, such as deep breathing while relaxing the lower abdomen and pelvic floor relaxation during bowel movements.  Watch your condition for any changes.  Keep all follow-up visits as told by your health care provider. This is important. Contact a health care provider if:  You have pain that gets worse.  You have a fever.  You do not have a bowel movement after 4 days.  You vomit.  You are not hungry.  You  lose weight.  You are bleeding from the anus.  You have thin, pencil-like stools. Get help right away if:  You have a fever and your symptoms suddenly get worse.  You leak stool or have blood in your stool.  Your abdomen is bloated.  You have severe pain in your abdomen.  You feel dizzy or you faint. This information is not intended to replace advice given to you by your health care provider. Make sure you discuss any questions you have with your health care provider. Document Revised: 01/28/2017 Document Reviewed: 08/06/2015 Elsevier Patient Education  2020 [employer].

## 2019-11-16 ENCOUNTER — Telehealth: Payer: Self-pay | Admitting: Neurology

## 2019-11-16 ENCOUNTER — Other Ambulatory Visit: Payer: Self-pay

## 2019-11-16 DIAGNOSIS — E876 Hypokalemia: Secondary | ICD-10-CM

## 2019-11-16 DIAGNOSIS — M549 Dorsalgia, unspecified: Secondary | ICD-10-CM | POA: Insufficient documentation

## 2019-11-16 LAB — COMPREHENSIVE METABOLIC PANEL
AG Ratio: 1.4 (calc) (ref 1.0–2.5)
ALT: 26 U/L (ref 6–29)
AST: 15 U/L (ref 10–35)
Albumin: 3.9 g/dL (ref 3.6–5.1)
Alkaline phosphatase (APISO): 113 U/L (ref 37–153)
BUN: 17 mg/dL (ref 7–25)
CO2: 26 mmol/L (ref 20–32)
Calcium: 9.6 mg/dL (ref 8.6–10.4)
Chloride: 102 mmol/L (ref 98–110)
Creat: 0.74 mg/dL (ref 0.50–0.99)
Globulin: 2.7 g/dL (calc) (ref 1.9–3.7)
Glucose, Bld: 86 mg/dL (ref 65–99)
Potassium: 5 mmol/L (ref 3.5–5.3)
Sodium: 137 mmol/L (ref 135–146)
Total Bilirubin: 0.9 mg/dL (ref 0.2–1.2)
Total Protein: 6.6 g/dL (ref 6.1–8.1)

## 2019-11-16 NOTE — Telephone Encounter (Signed)
Called patient back and informed her that we have not received all her labs back and once we do we will give her a call. Patient wanted to know approx when it would be in, and I advised patient that since it's an outside lab we are unsure when we will receive all. Patient asked if she were to call on Thursday or Friday, would we have results then? I then advised patient once again that I cannot guarantee her that results would be ready. Patient was reminded that she was previously told on her last call 9/13 that labs weren't ready and it can take approx a week or longer. Patient verbalized understanding that once labs are complete we will call her.

## 2019-11-16 NOTE — Assessment & Plan Note (Addendum)
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 She is feeling well today but she is still having intermittent symptoms of weakness notably on the left side. She has not taken Plavix or the Gabapentin she was given but is taking the Keppra. No seizure activity or side effects with the medication. She is referred to [facility] neurology for second opinion as she is just in need of reassurance that she has done everything she can do.

## 2019-11-16 NOTE — Assessment & Plan Note (Signed)
Her back pain and lower extremity weakness has improved after steroid treatments.

## 2019-11-16 NOTE — Progress Notes (Signed)
Subjective:    Patient ID: Danielle Fox, female    DOB: 12-15-1950, 69 y.o.   MRN: [id number]  Chief Complaint  Patient presents with  . MyChart-Medication Management    HPI Patient is in today for follow up on chronic medical concerns. No recent febrile illness or hospitalization. Her back pain and weakness have improved with steroid treatment. She is very anxious and has a lot of questions about the medications she has been given over the past couple of months as she has struggled with her C Diff infection and her neurologic disorder. She feels well today. No recent febrile illness or other acute concerns. Her back pain and lower extremity weakness have improved with steroid treatments. She notes her appetite has improved and her abdominal is resolved also. No new neurologic concerns. She is interested in a second opinion regarding her neurologic disorder. Denies CP/palp/SOB/HA/congestion/fevers or GU c/o. Taking meds as prescribed  Past Medical History:  Diagnosis Date  . Allergy   . Clostridioides difficile infection 10/19/2018   tested 10/2018 negative  . Colon polyps   . Constipation 10/06/2014  . COPD (chronic obstructive pulmonary disease) ([facility])   . Osteopenia   . 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 Preventative health care 06/29/2016  . Pruritus 06/29/2016  . Pyloric stenosis in adult 05/2019  . Welcome to Medicare preventive visit 06/29/2016    Past Surgical History:  Procedure Laterality Date  . APPENDECTOMY    . COLONOSCOPY     01-14-2005  . SINUS IRRIGATION  04/02/2015  . UPPER GASTROINTESTINAL ENDOSCOPY  11/22/2018    Family History  Problem Relation Age of Onset  . Heart disease Mother        cabg, MI  . Alcohol abuse Father   . Cirrhosis Father   . Heart disease Father        MI  . Colon cancer Neg Hx   . Colon polyps Neg Hx   . Esophageal cancer Neg Hx   . Stomach cancer Neg Hx   . Rectal cancer Neg Hx     Social History   Socioeconomic History   . Marital status: Significant Other    Spouse name: Not on file  . Number of children: 3  . Years of education: Not on file  . Highest education level: Not on file  Occupational History  . 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 Smoking status: Former Smoker    Packs/day: 1.00    Years: 48.00    Pack years: 48.00    Types: Cigarettes    Quit date: 02/19/2014    Years since quitting: 5.7  . Smokeless tobacco: Never Used  . Tobacco comment: Quit and has no desire to smoke again,  Vaping Use  . Vaping Use: Never used  Substance and Sexual Activity  . Alcohol use: Yes    Alcohol/week: 0.0 standard drinks    Comment: socially  . Drug use: No  . Sexual activity: Yes    Partners: Male  Other Topics Concern  . Not on file  Social History Narrative   Exercise-- walks dog on occasion   Right Handed   One Story Home   Does not drink caffeine    Social Determinants of Health   Financial Resource Strain:   . Difficulty of Paying Living Expenses: Not on file  Food Insecurity:   . Worried About Charity fundraiser in the  Last Year: Not on file  . Ran Out of Food in the Last Year: Not on file  Transportation Needs:   . Lack of Transportation (Medical): Not on file  . Lack of Transportation (Non-Medical): Not on file  Physical Activity:   . Days of Exercise per Week: Not on file  . Minutes of Exercise per Session: Not on file  Stress:   . Feeling of Stress : Not on file  Social Connections:   . Frequency of Communication with Friends and Family: Not on file  . Frequency of Social Gatherings with Friends and Family: Not on file  . Attends Religious Services: Not on file  . Active Member of Clubs or Organizations: Not on file  . Attends Archivist Meetings: Not on file  . Marital Status: Not on file  Intimate Partner Violence:   . Fear of Current or Ex-Partner: Not on file  . Emotionally Abused: Not on file  . Physically Abused:  Not on file  . Sexually Abused: Not on file    Outpatient Medications Prior to Visit  Medication Sig Dispense Refill  . acetaminophen (TYLENOL) 325 MG tablet Take 650 mg by mouth every 6 (six) hours as needed for mild pain.     . budesonide-formoterol (SYMBICORT) 80-4.5 MCG/ACT inhaler Inhale 2 puffs into the lungs in the morning and at bedtime. 30.6 g 1  . dicyclomine (BENTYL) 10 MG capsule Take 1 capsule (10 mg total) by mouth 4 (four) times daily -  before meals and at bedtime. 30 capsule 0  . esomeprazole (NEXIUM) 40 MG capsule Take 1 capsule (40 mg total) by mouth daily before breakfast. 90 capsule 3  . furosemide (LASIX) 20 MG tablet Take 1 tablet (20 mg total) by mouth daily as needed for edema. 30 tablet 0  . levETIRAcetam (KEPPRA) 500 MG tablet Take 1 tablet (500 mg total) by mouth 2 (two) times daily. 60 tablet 5  . potassium chloride SA (KLOR-CON) 20 MEQ tablet Take 1 tablet (20 mEq total) by mouth daily as needed (sixlaix). 30 tablet 0  . promethazine (PHENERGAN) 12.5 MG suppository Place 1 suppository (12.5 mg total) rectally every 6 (six) hours as needed for nausea or vomiting. 15 suppository 1  . saccharomyces boulardii (FLORASTOR) 250 MG capsule Take 1 capsule (250 mg total) by mouth 2 (two) times daily. 60 capsule 1  . gabapentin (NEURONTIN) 100 MG capsule Take 3 capsules (300 mg total) by mouth 3 (three) times daily. (Patient not taking: Reported on 10/26/2019) 270 capsule 01   No facility-administered medications prior to visit.    Allergies  Allergen Reactions  . Doxycycline Nausea And Vomiting  . Erythromycin Base Nausea Only    Review of Systems  Constitutional: Negative for fever and malaise/fatigue.  HENT: Negative for congestion.   Eyes: Negative for blurred vision.  Respiratory: Negative for shortness of breath.   Cardiovascular: Negative for chest pain, palpitations and leg swelling.  Gastrointestinal: Negative for abdominal pain, blood in stool and nausea.    Genitourinary: Negative for dysuria and frequency.  Musculoskeletal: Negative for falls.  Skin: Negative for rash.  Neurological: Positive for focal weakness. Negative for dizziness, loss of consciousness and headaches.  Endo/Heme/Allergies: Negative for environmental allergies.  Psychiatric/Behavioral: Negative for depression. The patient is nervous/anxious.        Objective:    Physical Exam Vitals and nursing note reviewed.  Constitutional:      General: She is not in acute distress.    Appearance: She  is well-developed.  HENT:     Head: Normocephalic and atraumatic.     Nose: Nose normal.  Eyes:     General:        Right eye: No discharge.        Left eye: No discharge.  Cardiovascular:     Rate and Rhythm: Normal rate and regular rhythm.     Heart sounds: No murmur heard.   Pulmonary:     Effort: Pulmonary effort is normal.     Breath sounds: Normal breath sounds.  Abdominal:     General: Bowel sounds are normal.     Palpations: Abdomen is soft.     Tenderness: There is no abdominal tenderness.  Musculoskeletal:     Cervical back: Normal range of motion and neck supple.  Skin:    General: Skin is warm and dry.  Neurological:     Mental Status: She is alert and oriented to person, place, and time.     BP (!) 87/65 (BP Location: Left Arm, Patient Position: Sitting, Cuff Size: Large)   Pulse 93   Temp 97.7 F (36.5 C) (Oral)   Resp 12   Ht 5\' 1"  (1.549 m)   Wt 104 lb 3.2 oz (47.3 kg)   SpO2 98%   BMI 19.69 kg/m  Wt Readings from Last 3 Encounters:  11/15/19 104 lb 3.2 oz (47.3 kg)  10/26/19 103 lb 4 oz (46.8 kg)  10/08/19 112 lb (50.8 kg)    Diabetic Foot Exam - Simple   No data filed     Lab Results  Component Value Date   WBC 11.4 (H) 10/26/2019   HGB 11.7 (L) 10/26/2019   HCT 36.1 10/26/2019   PLT 550.0 (H) 10/26/2019   GLUCOSE 86 11/15/2019   CHOL 121 09/28/2019   TRIG 70 09/28/2019   HDL 47 09/28/2019   LDLDIRECT 155.9 12/14/2012    LDLCALC 60 09/28/2019   ALT 26 11/15/2019   AST 15 11/15/2019   NA 137 11/15/2019   K 5.0 11/15/2019   CL 102 11/15/2019   CREATININE 0.74 11/15/2019   BUN 17 11/15/2019   CO2 26 11/15/2019   TSH 1.75 09/27/2018   INR 1.0 09/27/2019   HGBA1C 5.9 (H) 09/28/2019    Lab Results  Component Value Date   TSH 1.75 09/27/2018   Lab Results  Component Value Date   WBC 11.4 (H) 10/26/2019   HGB 11.7 (L) 10/26/2019   HCT 36.1 10/26/2019   MCV 77.3 (L) 10/26/2019   PLT 550.0 (H) 10/26/2019   Lab Results  Component Value Date   NA 137 11/15/2019   K 5.0 11/15/2019   CO2 26 11/15/2019   GLUCOSE 86 11/15/2019   BUN 17 11/15/2019   CREATININE 0.74 11/15/2019   BILITOT 0.9 11/15/2019   ALKPHOS 78 10/12/2019   AST 15 11/15/2019   ALT 26 11/15/2019   PROT 6.6 11/15/2019   ALBUMIN 2.4 (L) 10/12/2019   CALCIUM 9.6 11/15/2019   ANIONGAP 13 10/12/2019   GFR 114.27 10/29/2019   Lab Results  Component Value Date   CHOL 121 09/28/2019   Lab Results  Component Value Date   HDL 47 09/28/2019   Lab Results  Component Value Date   LDLCALC 60 09/28/2019   Lab Results  Component Value Date   TRIG 70 09/28/2019   Lab Results  Component Value Date   CHOLHDL 2.6 09/28/2019   Lab Results  Component Value Date   HGBA1C 5.9 (H) 09/28/2019  Assessment & Plan:   Problem List Items Addressed This Visit    Vitamin D deficiency    Supplement and monitor      Clostridioides difficile infection    Her bowel movements are much better and she is no longer having abdominal pain. She brings in her meds for review and does not understand that he Dicyclomine is for abdominal pain. She is not in need of it presently but can put it aside in case abdominal pain returns.       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 She is feeling well today but she is still having intermittent symptoms of weakness notably on the left side. She has not taken Plavix or the Gabapentin she was given but is taking the Keppra. No seizure activity or side effects with the medication. She is referred to [facility] neurology for second opinion as she is just in need of reassurance that she has done everything she can do.       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       Other Visit Diagnoses    Low blood potassium       Relevant Orders   Comprehensive metabolic panel (Completed)      I am having Kynedi M. Scarpulla "Mickey" maintain her esomeprazole, budesonide-formoterol, gabapentin, levETIRAcetam, acetaminophen, dicyclomine, saccharomyces boulardii, promethazine, furosemide, and potassium chloride SA.  No orders of the defined types were placed in this encounter.    Penni Homans, MD

## 2019-11-16 NOTE — Assessment & Plan Note (Signed)
Her bowel movements are much better and she is no longer having abdominal pain. She brings in her meds for review and does not understand that he Dicyclomine is for abdominal pain. She is not in need of it presently but can put it aside in case abdominal pain returns.

## 2019-11-16 NOTE — Assessment & Plan Note (Signed)
Supplement and monitor 

## 2019-11-16 NOTE — Telephone Encounter (Signed)
[employer] 11/16/19 @ 12:41pm:  "Caller states she had a lumbar puncture last Thursday and has not heard from the office. Caller reports she has called a number of times for the results."

## 2019-11-19 LAB — LEUKEMIA/LYMPHOMA EVALUATION PANEL

## 2019-11-19 LAB — GLUCOSE, CSF: Glucose, CSF: 42 mg/dL (ref 40–80)

## 2019-11-19 LAB — HERPES SIMPLEX VIRUS 1/2 (IGG), CSF
HSV 1 IgG Index:: 0.24
HSV 2 IgG Index:: <0.01

## 2019-11-19 LAB — VARICELLA ZOSTER VIRUS (VZV) DNA, QUANTITATIVE REAL-TIME PCR: VZV QN PCR: <500 copies/mL (ref <500)

## 2019-11-19 LAB — CSF CELL COUNT WITH DIFFERENTIAL
Basophils, %: 0 %
Lymphs, CSF: 82 % — ABNORMAL HIGH (ref 40–80)
Monocyte/Macrophage: 18 % (ref 15–45)
RBC Count, CSF: 1 cells/uL — ABNORMAL HIGH
Segmented Neutrophils-CSF: 0 % (ref 0–6)
WBC, CSF: 8 cells/uL — ABNORMAL HIGH (ref 0–5)

## 2019-11-19 LAB — MAYO MISC ORDER: PRICE:: 1540

## 2019-11-19 LAB — PROTEIN, CSF: Total Protein, CSF: 45 mg/dL (ref 15–60)

## 2019-11-20 ENCOUNTER — Telehealth: Payer: Self-pay | Admitting: Neurology

## 2019-11-20 ENCOUNTER — Other Ambulatory Visit: Payer: Self-pay

## 2019-11-20 DIAGNOSIS — M359 Systemic involvement of connective tissue, unspecified: Secondary | ICD-10-CM

## 2019-11-20 NOTE — Telephone Encounter (Signed)
Patient called after hours and left a message stating she forgot to ask Dr. Tomi Likens a question related to her medications.   See telephone note from yesterday for more details.

## 2019-11-20 NOTE — Telephone Encounter (Signed)
Called patient back for more details and she said wants to be sure Dr. Tomi Likens wants her to continure taking levetiracetam 500 MG twice a day.

## 2019-11-23 NOTE — Telephone Encounter (Signed)
Has this been completed?  Sending to clinical staff for review: Okay to sign/close encounter or is further follow up needed? 

## 2019-12-01 ENCOUNTER — Other Ambulatory Visit: Payer: Self-pay | Admitting: Family Medicine

## 2019-12-04 ENCOUNTER — Telehealth: Payer: Self-pay | Admitting: Family Medicine

## 2019-12-04 DIAGNOSIS — Z8673 Personal history of transient ischemic attack (TIA), and cerebral infarction without residual deficits: Secondary | ICD-10-CM

## 2019-12-04 DIAGNOSIS — G039 Meningitis, unspecified: Secondary | ICD-10-CM

## 2019-12-04 DIAGNOSIS — C801 Malignant (primary) neoplasm, unspecified: Secondary | ICD-10-CM

## 2019-12-04 DIAGNOSIS — C7949 Secondary malignant neoplasm of other parts of nervous system: Secondary | ICD-10-CM

## 2019-12-04 NOTE — Telephone Encounter (Signed)
Patient notified new referral has been placed

## 2019-12-04 NOTE — Telephone Encounter (Signed)
Caller : Shaney  Call Back # [phone]  Patient states referral was place to [facility],per patient they are no longer accepting new patients. She would like a referral to [facility].

## 2019-12-05 ENCOUNTER — Ambulatory Visit (INDEPENDENT_AMBULATORY_CARE_PROVIDER_SITE_OTHER): Payer: Medicare Other | Admitting: Nurse Practitioner

## 2019-12-05 ENCOUNTER — Other Ambulatory Visit (INDEPENDENT_AMBULATORY_CARE_PROVIDER_SITE_OTHER): Payer: Medicare Other

## 2019-12-05 ENCOUNTER — Encounter: Payer: Self-pay | Admitting: Nurse Practitioner

## 2019-12-05 VITALS — BP 110/66 | HR 70 | Ht 61.0 in | Wt 110.0 lb

## 2019-12-05 DIAGNOSIS — K559 Vascular disorder of intestine, unspecified: Secondary | ICD-10-CM | POA: Diagnosis not present

## 2019-12-05 DIAGNOSIS — D649 Anemia, unspecified: Secondary | ICD-10-CM

## 2019-12-05 DIAGNOSIS — N838 Other noninflammatory disorders of ovary, fallopian tube and broad ligament: Secondary | ICD-10-CM

## 2019-12-05 LAB — CBC
HCT: 33.3 % — ABNORMAL LOW (ref 36.0–46.0)
Hemoglobin: 10.6 g/dL — ABNORMAL LOW (ref 12.0–15.0)
MCHC: 31.9 g/dL (ref 30.0–36.0)
MCV: 80.5 fl (ref 78.0–100.0)
Platelets: 401 10*3/uL — ABNORMAL HIGH (ref 150.0–400.0)
RBC: 4.14 Mil/uL (ref 3.87–5.11)
RDW: 20.9 % — ABNORMAL HIGH (ref 11.5–15.5)
WBC: 7.7 10*3/uL (ref 4.0–10.5)

## 2019-12-05 NOTE — Patient Instructions (Addendum)
Your provider has requested that you go to the basement level for lab work before leaving today. Press "B" on the elevator. The lab is located at the first door on the left as you exit the elevator.  Continue Nexium 40mg  once daily.   Follow-up with Dr. Carlean Purl in 3-4 months.   Referral has been made to GYN. If you do not hear from their office in 1 week,please contact their office at [phone].  If you are age 69 or older, your body mass index should be between 23-30. Your Body mass index is 20.78 kg/m. If this is out of the aforementioned range listed, please consider follow up with your Primary Care Provider.  If you are age 46 or younger, your body mass index should be between 19-25. Your Body mass index is 20.78 kg/m. If this is out of the aformentioned range listed, please consider follow up with your Primary Care Provider.    Thank you for choosing me and [facility].  Danielle Fox

## 2019-12-05 NOTE — Progress Notes (Signed)
12/05/2019 Danielle Fox [id number] [birth year]   Chief Complaint: colitis follow up   History of Present Illness: is a 69 y.o. female with a past medical history of osteopenia, pre-diabetes, questionable stroke verses autoimmune cerebritis 09/15/2019, COPD, positive TB skin test, pyloric stenosis, C. difficile 09/2018 and hyperplastic colon polyp.  She was admitted to [facility] 10/08/2019 with lower abdominal pain and hematochezia. An abdominal/pelvic CT angiogram 10/08/2019 showed  new thickening and inflammation of the transverse and descending colon since the prior study 8 days ago with surrounding edema and a small amount of free fluid in the left pericolic gutter and dependent pelvis. A large 8.8cm cyst/mass was also identified. She initially assessed to most likely have ischemic colitis which precipitated by constipation and laxative use. Stool cultures were positive for enterotoxigenic E. Coli and she was treated for infectious colitis. She received Cipro and concomitant vancomycin due to past history of C. Diff. She was discharged home on 10/12/2019. Outpatient follow up regarding her left adnexal mass was recommended but not done.   She was seen in our office for GI follow up by Alonza Bogus PA-C on 10/26/2019. At that time, she reported feeling poorly with persistent nausea and decreased po intake. She continued to have 3 to 4 loose stools daily. She was advised to go to the ED for further evaluation, however, the patient declined to do so. She was prescribed Phenergan suppositories which significantly reduced her nausea and he po intake steadily improved.   She presents to our office today for further follow up. She denies having any further nausea. No upper or lower abdominal pain. She is passing normal formed stools. She is taking a stool softener twice weekly.  No diarrhea. No hematochezia. He appetite has improved. She reported losing 12 lbs during her hospital admission but she is starting to  gain weight. Weight today 110 lbs. She has a history of pyloric stenosis, previously advised by Dr. Carlean Purl to take Nexium indefinitely. However, she stopped taking the Nexium since she was discharged home 10/12/2019. She didn't want to take it unless absolutely necessary.   Labs 10/26/2019: WBC 11.4. Hg 11.7. HCT 36.1. PLT 550.   Abd/pelvic CT angiogram 10/08/2019: 1. No evidence of significant mesenteric arterial occlusive disease. 2. New thickening and inflammation of the transverse and descending colon since the prior study 8 days ago with surrounding edema and a small amount of free fluid in the left pericolic gutter and dependent pelvis. Findings are consistent with acute colitis. No evidence of associated colonic perforation or focal abscess. The colon proximal to the segment of colitis demonstrates some dilatation. 3. Stable large cyst associated with the left adnexal region measuring up to 8.8 cm and demonstrating simple fluid density internally. However, further characterization with pelvic ultrasound is recommended as well as referral to gynecology as previously recommended, as a large postmenopausal adnexal cyst will need to be followed and further characterized.  EGD 03/16/2019: - Gastric stenosis was found at the pylorus. Dilated. Biopsied. - Erosive gastropathy with no stigmata of recent bleeding. Biopsied. - The examination was otherwise normal. - Use Nexium (esomeprazole) 40 mg PO daily indefinitely.  Colonoscopy 11/22/2018:  - Diverticulosis in the entire examined colon. - Stricture in the sigmoid colon. - The examined portion of the ileum was normal. Biopsied. - The examination was otherwise normal on direct and retroflexion views. - Biopsies were taken with a cold forceps from the entire colon for evaluation of microscopic colitis.  Current Outpatient Medications on  File Prior to Visit  Medication Sig Dispense Refill  . acetaminophen (TYLENOL) 325 MG tablet Take 650  mg by mouth every 6 (six) hours as needed for mild pain.     . budesonide-formoterol (SYMBICORT) 80-4.5 MCG/ACT inhaler Inhale 2 puffs into the lungs in the morning and at bedtime. 30.6 g 1  . docusate sodium (COLACE) 100 MG capsule Take 100 mg by mouth 2 (two) times daily.    Marland Kitchen levETIRAcetam (KEPPRA) 500 MG tablet Take 1 tablet (500 mg total) by mouth 2 (two) times daily. 60 tablet 5  . esomeprazole (NEXIUM) 40 MG capsule Take 1 capsule (40 mg total) by mouth daily before breakfast. (Patient not taking: Reported on 12/05/2019) 90 capsule 3   No current facility-administered medications on file prior to visit.     Allergies  Allergen Reactions  . Doxycycline Nausea And Vomiting  . Erythromycin Base Nausea Only      Current Medications, Allergies, Past Medical History, Past Surgical History, Family History and Social History were reviewed in [employer] record.   Review of Systems:   Constitutional: Negative for fever, sweats, chills or weight loss.  Respiratory: Negative for shortness of breath.   Cardiovascular: Negative for chest pain, palpitations and leg swelling.  Gastrointestinal: See HPI.  Musculoskeletal: Negative for back pain or muscle aches.  Neurological: See HPI.    Physical Exam: BP 110/66   Pulse 70   Ht 5\' 1"  (1.549 m)   Wt 110 lb (49.9 kg)   BMI 20.78 kg/m   Wt Readings from Last 3 Encounters:  12/05/19 110 lb (49.9 kg)  11/15/19 104 lb 3.2 oz (47.3 kg)  10/26/19 103 lb 4 oz (46.8 kg)   General: Petite  69 year old female in no acute distress. Head: Normocephalic and atraumatic. Eyes: No scleral icterus. Conjunctiva pink . Ears: Normal auditory acuity. Lungs: Clear throughout to auscultation. Heart: Regular rate and rhythm, no murmur. Abdomen: Soft, nontender and nondistended. No masses or hepatomegaly. Normal bowel sounds x 4 quadrants.  Rectal: Deferred.  Musculoskeletal: Symmetrical with no gross deformities. Extremities: No  edema. Neurological: Alert oriented x 4. No focal deficits.  Psychological: Alert and cooperative. Normal mood and affect  Assessment and Recommendations:  37. 69 year old female with infectious colitis( enterotoxigenic E. Coli), resolved   2. Nausea, resolved   3. History of pyloric stenosis  -Patient to restart Nexium (Esomoperazole) 40mg  once daily to take indefinitely due to history of pyloric stenosis. She agreed to restart Nexium, she has a current supply or Nexium. She will contact our office when she needs a refill, if not affordable will change to alternative PPI.  4. Normocytic anemia. -CBC  5. Large left adnexal mass/cyst seen on CTAP 09/30/2019 and abd/pelvic CT angio 10/08/2019 -I discussed the CTAP and angio results with the patient as she did not recall being informed she had a left ovarian mass/cyst (her hospital discharge summary noted the ovarian mass with recommendations for outpatient follow up). A copy of her abd/pelvic CT angiogram was printed and given to the patient. GYN referral entered.   6. Colon cancer screening, up to date  Patient to follow up in our office in 3 to 4 months and as needed

## 2019-12-06 ENCOUNTER — Other Ambulatory Visit: Payer: Self-pay

## 2019-12-06 ENCOUNTER — Other Ambulatory Visit: Payer: Self-pay | Admitting: Obstetrics and Gynecology

## 2019-12-06 ENCOUNTER — Ambulatory Visit (INDEPENDENT_AMBULATORY_CARE_PROVIDER_SITE_OTHER): Payer: Medicare Other

## 2019-12-06 ENCOUNTER — Encounter: Payer: Self-pay | Admitting: Obstetrics and Gynecology

## 2019-12-06 ENCOUNTER — Ambulatory Visit: Payer: Medicare Other | Admitting: Obstetrics and Gynecology

## 2019-12-06 VITALS — BP 100/62 | HR 77 | Resp 12 | Ht 61.0 in | Wt 109.0 lb

## 2019-12-06 DIAGNOSIS — N83202 Unspecified ovarian cyst, left side: Secondary | ICD-10-CM

## 2019-12-06 NOTE — Patient Instructions (Signed)
Ovarian Cyst     An ovarian cyst is a fluid-filled sac that forms on an ovary. The ovaries are small organs that produce eggs in women. Various types of cysts can form on the ovaries. Some may cause symptoms and require treatment. Most ovarian cysts go away on their own, are not cancerous (are benign), and do not cause problems. Common types of ovarian cysts include:  Functional (follicle) cysts. ? Occur during the menstrual cycle, and usually go away with the next menstrual cycle if you do not get pregnant. ? Usually cause no symptoms.  Endometriomas. ? Are cysts that form from the tissue that lines the uterus (endometrium). ? Are sometimes called "chocolate cysts" because they become filled with blood that turns brown. ? Can cause pain in the lower abdomen during intercourse and during your period.  Cystadenoma cysts. ? Develop from cells on the outside surface of the ovary. ? Can get very large and cause lower abdomen pain and pain with intercourse. ? Can cause severe pain if they twist or break open (rupture).  Dermoid cysts. ? Are sometimes found in both ovaries. ? May contain different kinds of body tissue, such as skin, teeth, hair, or cartilage. ? Usually do not cause symptoms unless they get very big.  Theca lutein cysts. ? Occur when too much of a certain hormone (human chorionic gonadotropin) is produced and overstimulates the ovaries to produce an egg. ? Are most common after having procedures used to assist with the conception of a baby (in vitro fertilization). What are the causes? Ovarian cysts may be caused by:  Ovarian hyperstimulation syndrome. This is a condition that can develop from taking fertility medicines. It causes multiple large ovarian cysts to form.  Polycystic ovarian syndrome (PCOS). This is a common hormonal disorder that can cause ovarian cysts, as well as problems with your period or fertility. What increases the risk? The following factors may  make you more likely to develop ovarian cysts:  Being overweight or obese.  Taking fertility medicines.  Taking certain forms of hormonal birth control.  Smoking. What are the signs or symptoms? Many ovarian cysts do not cause symptoms. If symptoms are present, they may include:  Pelvic pain or pressure.  Pain in the lower abdomen.  Pain during sex.  Abdominal swelling.  Abnormal menstrual periods.  Increasing pain with menstrual periods. How is this diagnosed? These cysts are commonly found during a routine pelvic exam. You may have tests to find out more about the cyst, such as:  Ultrasound.  X-ray of the pelvis.  CT scan.  MRI.  Blood tests. How is this treated? Many ovarian cysts go away on their own without treatment. Your health care provider may want to check your cyst regularly for 2-3 months to see if it changes. If you are in menopause, it is especially important to have your cyst monitored closely because menopausal women have a higher rate of ovarian cancer. When treatment is needed, it may include:  Medicines to help relieve pain.  A procedure to drain the cyst (aspiration).  Surgery to remove the whole cyst.  Hormone treatment or birth control pills. These methods are sometimes used to help dissolve a cyst. Follow these instructions at home:  Take over-the-counter and prescription medicines only as told by your health care provider.  Do not drive or use heavy machinery while taking prescription pain medicine.  Get regular pelvic exams and Pap tests as often as told by your health care provider.    Return to your normal activities as told by your health care provider. Ask your health care provider what activities are safe for you.  Do not use any products that contain nicotine or tobacco, such as cigarettes and e-cigarettes. If you need help quitting, ask your health care provider.  Keep all follow-up visits as told by your health care provider.  This is important. Contact a health care provider if:  Your periods are late, irregular, or painful, or they stop.  You have pelvic pain that does not go away.  You have pressure on your bladder or trouble emptying your bladder completely.  You have pain during sex.  You have any of the following in your abdomen: ? A feeling of fullness. ? Pressure. ? Discomfort. ? Pain that does not go away. ? Swelling.  You feel generally ill.  You become constipated.  You lose your appetite.  You develop severe acne.  You start to have more body hair and facial hair.  You are gaining weight or losing weight without changing your exercise and eating habits.  You think you may be pregnant. Get help right away if:  You have abdominal pain that is severe or gets worse.  You cannot eat or drink without vomiting.  You suddenly develop a fever.  Your menstrual period is much heavier than usual. This information is not intended to replace advice given to you by your health care provider. Make sure you discuss any questions you have with your health care provider. Document Revised: 05/16/2017 Document Reviewed: 07/20/2015 Elsevier Patient Education  2020 Elsevier Inc.  

## 2019-12-06 NOTE — Progress Notes (Signed)
GYNECOLOGY  VISIT   HPI: 69 y.o. G3P3  Significant Other White or Caucasian Not Hispanic or Latino  female   No obstetric history on file. with No LMP recorded. Patient has had a hysterectomy.   here for evaluation of an ovarian cyst noted on CT scan for Colitis. CT from 09/30/19 and 10/08/19 revealed an 8.8 cm left adnexal cyst.  Once her colitis resolved her pain resolved. She is sexually active, no pain.   She had a hysterectomy in 1998, she still has her tubes and ovaries.   No h/o abnormal pap's prior to her hysterectomy  She has 3 sons and 2 grandsons.  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   GYNECOLOGIC HISTORY: No LMP recorded. Patient has had a hysterectomy. Contraception: postmenopausal Menopausal hormone therapy: none         OB History    Gravida  3   Para  3   Term  3   Preterm      AB      Living  3     SAB      TAB      Ectopic      Multiple      Live Births  3              Patient Active Problem List   Diagnosis Date Noted  . Back pain 11/16/2019  . Nausea and vomiting 10/26/2019  . Ischemic colitis ([place])   . Acute colitis 10/08/2019  . Lower abdominal pain   . Abnormal CT scan, colon   . Adnexal mass   . 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 H/O: CVA (cerebrovascular accident) 09/19/2019  . Hypokalemia 09/19/2019  . Clostridioides difficile infection   . Diarrhea 09/24/2018  . Vitamin D deficiency 01/02/2017  . Welcome to Medicare preventive visit 06/29/2016  . Pruritus 06/29/2016  . Abnormal CT scan 08/14/2015  . Constipation 10/06/2014  . 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 Rib pain on left side 09/08/2014  . Chronic cough 06/14/2014  . SOB (shortness of breath) 06/14/2014  . Cough 05/20/2014  . Hyperlipidemia, mild 08/15/2011  . Osteopenia 03/01/2011  . History of smoking 03/01/2011  . Musculoskeletal pain 03/01/2011    Past Medical History:  Diagnosis Date   . Allergy   . Clostridioides difficile infection 10/19/2018   tested 10/2018 negative  . Colon polyps   . Constipation 10/06/2014  . COPD (chronic obstructive pulmonary disease) ([place])   . Osteopenia   . 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 Preventative health care 06/29/2016  . Pruritus 06/29/2016  . Pyloric stenosis in adult 05/2019  . Welcome to Medicare preventive visit 06/29/2016    Past Surgical History:  Procedure Laterality Date  . ABDOMINAL HYSTERECTOMY    . APPENDECTOMY    . COLONOSCOPY     01-14-2005  . SINUS IRRIGATION  04/02/2015  . UPPER GASTROINTESTINAL ENDOSCOPY  11/22/2018    Current Outpatient Medications  Medication Sig Dispense Refill  . acetaminophen (TYLENOL) 325 MG tablet Take 650 mg by mouth every 6 (six) hours as needed for mild pain.     . budesonide-formoterol (SYMBICORT) 80-4.5 MCG/ACT inhaler Inhale 2 puffs into the lungs in the morning and at bedtime. 30.6 g 1  . docusate sodium (COLACE) 100 MG capsule Take 100 mg by mouth 2 (two) times daily.    Marland Kitchen esomeprazole (NEXIUM) 40 MG capsule Take 1  capsule (40 mg total) by mouth daily before breakfast. 90 capsule 3  . levETIRAcetam (KEPPRA) 500 MG tablet Take 1 tablet (500 mg total) by mouth 2 (two) times daily. 60 tablet 5   No current facility-administered medications for this visit.     ALLERGIES: Doxycycline and Erythromycin base  Family History  Problem Relation Age of Onset  . Heart disease Mother        cabg, MI  . Alcohol abuse Father   . Cirrhosis Father   . Heart disease Father        MI  . Colon cancer Neg Hx   . Colon polyps Neg Hx   . Esophageal cancer Neg Hx   . Stomach cancer Neg Hx   . Rectal cancer Neg Hx     Social History   Socioeconomic History  . Marital status: Significant Other    Spouse name: Not on file  . Number of children: 3  . Years of education: Not on file  . Highest education level: Not on file  Occupational History  . 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 Smoking status: Former Smoker    Packs/day: 1.00    Years: 48.00    Pack years: 48.00    Types: Cigarettes    Quit date: 02/19/2014    Years since quitting: 5.7  . Smokeless tobacco: Never Used  . Tobacco comment: Quit and has no desire to smoke again,  Vaping Use  . Vaping Use: Never used  Substance and Sexual Activity  . Alcohol use: Yes    Alcohol/week: 0.0 standard drinks    Comment: socially  . Drug use: No  . Sexual activity: Yes    Partners: Male  Other Topics Concern  . Not on file  Social History Narrative   Exercise-- walks dog on occasion   Right Handed   One Story Home   Does not drink caffeine    Social Determinants of Health   Financial Resource Strain:   . Difficulty of Paying Living Expenses: Not on file  Food Insecurity:   . Worried About Charity fundraiser in the Last Year: Not on file  . Ran Out of Food in the Last Year: Not on file  Transportation Needs:   . Lack of Transportation (Medical): Not on file  . Lack of Transportation (Non-Medical): Not on file  Physical Activity:   . Days of Exercise per Week: Not on file  . Minutes of Exercise per Session: Not on file  Stress:   . Feeling of Stress : Not on file  Social Connections:   . Frequency of Communication with Friends and Family: Not on file  . Frequency of Social Gatherings with Friends and Family: Not on file  . Attends Religious Services: Not on file  . Active Member of Clubs or Organizations: Not on file  . Attends Archivist Meetings: Not on file  . Marital Status: Not on file  Intimate Partner Violence:   . Fear of Current or Ex-Partner: Not on file  . Emotionally Abused: Not on file  . Physically Abused: Not on file  . Sexually Abused: Not on file    Review of Systems  All other systems reviewed and are negative.   PHYSICAL EXAMINATION:    BP 100/62   Pulse 77   Resp 12   Ht 5\' 1"  (1.549 m)   Wt 109 lb (49.4 kg)    BMI 20.60  kg/m     General appearance: alert, cooperative and appears stated age Abdomen: soft, non-tender; non distended, no masses,  no organomegaly  Pelvic: External genitalia:  no lesions              Urethra:  normal appearing urethra with no masses, tenderness or lesions              Bartholins and Skenes: normal                 Vagina: atrophic appearing vagina with normal color and discharge, no lesions              Cervix: absent              Bimanual Exam:  Uterus:  uterus absent              Adnexa: cystic, tender mass felt at the vaginal apex and towards the patients left.               Rectovaginal: Yes.  .  Confirms.              Anus:  normal sphincter tone, no lesions  Chaperone was present for exam.  ASSESSMENT 8.8 cm left ovarian cyst    PLAN Pelvic Ultrasound CA 125   Will do pelvic ultrasound today. Addendum: ultrasound with absent uterus/cervix, right ovary not seen. There is a 9.3 overall simple appearing left adnexal cyst. There are 2 other small cysts within the larger cyst (vs thin septations). The other cysts measure 2.4 and 1.3 cm. No blood flow. No ascites.  The patient doesn't want to have surgery unless absolutely necessary. Overall the cyst appears benign, it is under 10 cm. If CA 125 is normal will ask the opinion of GYN Oncology if they feel it is reasonable to follow with up with ultrasound.    CC: Dr Charlett Blake

## 2019-12-07 LAB — CA 125: Cancer Antigen (CA) 125: 13.4 U/mL (ref 0.0–38.1)

## 2019-12-10 ENCOUNTER — Encounter: Payer: Self-pay | Admitting: Obstetrics and Gynecology

## 2019-12-11 ENCOUNTER — Encounter: Payer: Self-pay | Admitting: Family Medicine

## 2019-12-11 ENCOUNTER — Other Ambulatory Visit: Payer: Self-pay

## 2019-12-11 ENCOUNTER — Telehealth: Payer: Self-pay | Admitting: Obstetrics and Gynecology

## 2019-12-11 DIAGNOSIS — N9489 Other specified conditions associated with female genital organs and menstrual cycle: Secondary | ICD-10-CM

## 2019-12-11 DIAGNOSIS — D649 Anemia, unspecified: Secondary | ICD-10-CM

## 2019-12-11 NOTE — Telephone Encounter (Signed)
Spoke with the patient, advised her that the Oncologist also thought it was reasonable to follow the adnexal cyst. Will order a 3 month f/u ultrasound. The patient understands the small risk of missing abnormal pathology. Desires to avoid surgery.

## 2019-12-11 NOTE — Telephone Encounter (Signed)
Do you need for me to put in a new referral or can you use the one from 10/5?  [facility] is not accepting new patients.

## 2019-12-11 NOTE — Telephone Encounter (Signed)
-----   Message from Lafonda Mosses, MD sent at 12/10/2019  6:31 PM EDT ----- Warden Fillers, I think that's reasonable - you could even wait 12 weeks to repeat. I agree, very benign in appearance. At that size, its unlikely to resolve on its own. But, if she's asymptomatic and really wants to avoid surgery, I think its reasonable to prove no change on imaging and just watch. As long as she understands the small risk that not getting a pathology diagnosis poses (again, I think this is reasonable and a small risk). Happy to see her if you/she would prefer, but I'm also comfortable with not seeing her and you letting me know if something changes with her imaging. Best, Wendelyn Breslow ----- Message ----- From: Salvadore Dom, MD Sent: 12/10/2019   5:05 PM EDT To: Lafonda Mosses, MD  Hi Dr Berline Lopes, I was hoping to get your advice. The attached chart is for a PMP woman with an incidental finding of a 9.3 cm left adnexal cyst. She has a h/o a hysterectomy. The cyst is mostly simple, but has 2 other small cysts within it vs thin septations. There is no abnormal blood flow, no ascites and she has a normal CA 125. She would like to avoid surgery if possible. I wanted to make sure you thought it was reasonable for me to do a f/u ultrasound in 6 weeks to document stability. If you want me to send her to you for a consultation, just let me know. The ultrasound is in her chart.  Thank you for your help! Sumner Boast, MD [facility]

## 2019-12-12 ENCOUNTER — Other Ambulatory Visit (INDEPENDENT_AMBULATORY_CARE_PROVIDER_SITE_OTHER): Payer: Medicare Other

## 2019-12-12 DIAGNOSIS — D649 Anemia, unspecified: Secondary | ICD-10-CM

## 2019-12-12 LAB — IBC PANEL
Iron: 75 ug/dL (ref 42–145)
Saturation Ratios: 19.3 % — ABNORMAL LOW (ref 20.0–50.0)
Transferrin: 278 mg/dL (ref 212.0–360.0)

## 2019-12-12 LAB — VITAMIN B12: Vitamin B-12: 341 pg/mL (ref 211–911)

## 2019-12-13 LAB — IRON,TIBC AND FERRITIN PANEL
%SAT: 22 % (calc) (ref 16–45)
Ferritin: 19 ng/mL (ref 16–288)
Iron: 79 ug/dL (ref 45–160)
TIBC: 361 mcg/dL (calc) (ref 250–450)

## 2019-12-15 ENCOUNTER — Ambulatory Visit: Payer: Medicare Other | Attending: Internal Medicine

## 2019-12-15 DIAGNOSIS — Z23 Encounter for immunization: Secondary | ICD-10-CM

## 2019-12-15 NOTE — Progress Notes (Signed)
   Covid-19 Vaccination Clinic  Name:  Danielle Fox    MRN: [id number] DOB: 1950-08-20  12/15/2019  [name] was observed post Covid-19 immunization for 15 minutes without incident. She was provided with Vaccine Information Sheet and instruction to access the V-Safe system.   [name] was instructed to call 911 with any severe reactions post vaccine: Marland Kitchen Difficulty breathing  . Swelling of face and throat  . A fast heartbeat  . A bad rash all over body  . Dizziness and weakness

## 2019-12-17 ENCOUNTER — Other Ambulatory Visit: Payer: Self-pay

## 2019-12-17 DIAGNOSIS — D649 Anemia, unspecified: Secondary | ICD-10-CM

## 2019-12-17 NOTE — Telephone Encounter (Signed)
[facility] [address] [place], [place] [address] Get Directions Appointments [phone] Office [phone] Fax [phone]   Fax sent today  12/17/19

## 2019-12-17 NOTE — Telephone Encounter (Signed)
Patient is calling in reference to referral to [facility] status.   Please advise

## 2019-12-18 ENCOUNTER — Encounter: Payer: Self-pay | Admitting: Obstetrics and Gynecology

## 2019-12-18 ENCOUNTER — Telehealth: Payer: Self-pay

## 2019-12-18 NOTE — Telephone Encounter (Signed)
Pt sent following mychart message:  Danielle Fox, Danielle Fox "Mickey"  P [facility] Have you heard back from your colleague regarding the ultrasound? Am I to have a follow up?

## 2019-12-18 NOTE — Telephone Encounter (Signed)
Routing to Dr Talbert Nan, please advise   See mychart message from 12/11/19.

## 2019-12-19 NOTE — Telephone Encounter (Signed)
I spoke to the patient on 12/11/19 and reviewed that the Oncologist agreed with f/u imaging in 3 months and the ultrasound was ordered. I suspect this message came in before I spoke to her. Please call her and make sure she doesn't have any questions.

## 2019-12-19 NOTE — Telephone Encounter (Signed)
Spoke with pt. Pt given update per Dr Talbert Nan. Pt agreeable and verbalized understanding to have PUS follow up in 3 months. Pt states has not been called for PUS scheduling.  Pt scheduled for 03/04/2020 at 1230 pm with Dr Talbert Nan. Pt agreeable to date and time of appt.  Cc: Hayley for precert Order placed.  Encounter closed

## 2019-12-28 ENCOUNTER — Ambulatory Visit (INDEPENDENT_AMBULATORY_CARE_PROVIDER_SITE_OTHER): Payer: Medicare Other | Admitting: Family

## 2019-12-28 ENCOUNTER — Other Ambulatory Visit: Payer: Self-pay

## 2019-12-28 VITALS — BP 102/50 | HR 82 | Temp 98.1°F | Resp 16 | Wt 109.0 lb

## 2019-12-28 DIAGNOSIS — M359 Systemic involvement of connective tissue, unspecified: Secondary | ICD-10-CM

## 2019-12-28 DIAGNOSIS — L299 Pruritus, unspecified: Secondary | ICD-10-CM | POA: Diagnosis not present

## 2019-12-28 DIAGNOSIS — Z8619 Personal history of other infectious and parasitic diseases: Secondary | ICD-10-CM

## 2019-12-28 DIAGNOSIS — Z23 Encounter for immunization: Secondary | ICD-10-CM | POA: Diagnosis not present

## 2019-12-28 DIAGNOSIS — G053 Encephalitis and encephalomyelitis in diseases classified elsewhere: Secondary | ICD-10-CM

## 2019-12-28 NOTE — Progress Notes (Signed)
Subjective:    Patient ID: Danielle Fox, female    DOB: Jan 09, 1951, 69 y.o.   MRN: [id number]  HPI  Patient is a 69 yr old female who presents today for follow up.   C diff infection- She is followed by gastroenterology. Her last visit was 12/05/2019.  She was treated for enterotoxigenic E. Coli for infectious colitis.  This was treated wieth cipro and vancomycin (do to pmhx of c diff).  Note was made during that hospitalization of a left adnexal mass (8.8 cm).  A GYN referral was placed on 10/6 by GI.  She saw GYN (Dr. Sumner Boast), and the plan at this time is for a follow up US in several months with GYN.   Referral was placed on 12/04/19 to Dr. Curtis Sites (neurology) at [facility] for a second opinion. 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 She states that she was initially evaluated out of town when she was visiting her son. She was then seen by neurology locally, Dr. Tomi Likens. Initial diagnosis was apparently CVA but later diagnosis was changed to autoimmune cerebritis. Dr. Loretta Plume referred her to Dr. Felecia Shelling- she reports that this visit will not take place for several months. In the meantime, she is trying to get a second opinion from neurology at [facility] an is waiting to hear back from them.  Reports that her only clinical symptom is occasional left hand jitters.   Reports some scalp itching and ear itching.  Denies rash.    Review of Systems    see HPI  Past Medical History:  Diagnosis Date  . Allergy   . Clostridioides difficile infection 10/19/2018   tested 10/2018 negative  . Colon polyps   . Constipation 10/06/2014  . COPD (chronic obstructive pulmonary disease) ([place])   . Osteopenia   . 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 Preventative health care 06/29/2016  . Pruritus 06/29/2016  . Pyloric stenosis in adult 05/2019  . Welcome to Medicare preventive visit 06/29/2016     Social History   Socioeconomic History  . Marital status: Significant  Other    Spouse name: Not on file  . Number of children: 3  . Years of education: Not on file  . Highest education level: Not on file  Occupational History  . 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 Smoking status: Former Smoker    Packs/day: 1.00    Years: 48.00    Pack years: 48.00    Types: Cigarettes    Quit date: 02/19/2014    Years since quitting: 5.8  . Smokeless tobacco: Never Used  . Tobacco comment: Quit and has no desire to smoke again,  Vaping Use  . Vaping Use: Never used  Substance and Sexual Activity  . Alcohol use: Yes    Alcohol/week: 0.0 standard drinks    Comment: socially  . Drug use: No  . Sexual activity: Yes    Partners: Male  Other Topics Concern  . Not on file  Social History Narrative   Exercise-- walks dog on occasion   Right Handed   One Story Home   Does not drink caffeine    Social Determinants of Health   Financial Resource Strain:   . Difficulty of Paying Living Expenses: Not on file  Food Insecurity:   . Worried About Charity fundraiser in the Last Year: Not on file  .  Ran Out of Food in the Last Year: Not on file  Transportation Needs:   . Lack of Transportation (Medical): Not on file  . Lack of Transportation (Non-Medical): Not on file  Physical Activity:   . Days of Exercise per Week: Not on file  . Minutes of Exercise per Session: Not on file  Stress:   . Feeling of Stress : Not on file  Social Connections:   . Frequency of Communication with Friends and Family: Not on file  . Frequency of Social Gatherings with Friends and Family: Not on file  . Attends Religious Services: Not on file  . Active Member of Clubs or Organizations: Not on file  . Attends Archivist Meetings: Not on file  . Marital Status: Not on file  Intimate Partner Violence:   . Fear of Current or Ex-Partner: Not on file  . Emotionally Abused: Not on file  . Physically Abused: Not on file  . Sexually Abused:  Not on file    Past Surgical History:  Procedure Laterality Date  . ABDOMINAL HYSTERECTOMY    . APPENDECTOMY    . COLONOSCOPY     01-14-2005  . SINUS IRRIGATION  04/02/2015  . UPPER GASTROINTESTINAL ENDOSCOPY  11/22/2018    Family History  Problem Relation Age of Onset  . Heart disease Mother        cabg, MI  . Alcohol abuse Father   . Cirrhosis Father   . Heart disease Father        MI  . Colon cancer Neg Hx   . Colon polyps Neg Hx   . Esophageal cancer Neg Hx   . Stomach cancer Neg Hx   . Rectal cancer Neg Hx     Allergies  Allergen Reactions  . Doxycycline Nausea And Vomiting  . Vancomycin     Nausea and abdominal pain  . Erythromycin Base Nausea Only    Current Outpatient Medications on File Prior to Visit  Medication Sig Dispense Refill  . acetaminophen (TYLENOL) 325 MG tablet Take 650 mg by mouth every 6 (six) hours as needed for mild pain.     . budesonide-formoterol (SYMBICORT) 80-4.5 MCG/ACT inhaler Inhale 2 puffs into the lungs in the morning and at bedtime. 30.6 g 1  . docusate sodium (COLACE) 100 MG capsule Take 100 mg by mouth 2 (two) times daily.    Marland Kitchen esomeprazole (NEXIUM) 40 MG capsule Take 1 capsule (40 mg total) by mouth daily before breakfast. 90 capsule 3  . levETIRAcetam (KEPPRA) 500 MG tablet Take 1 tablet (500 mg total) by mouth 2 (two) times daily. 60 tablet 5   No current facility-administered medications on file prior to visit.    BP (!) 102/50 (BP Location: Right Arm, Patient Position: Sitting, Cuff Size: Small)   Pulse 82   Temp 98.1 F (36.7 C) (Oral)   Resp 16   Wt 109 lb (49.4 kg)   SpO2 99%   BMI 20.60 kg/m    Objective:   Physical Exam Constitutional:      Appearance: She is well-developed.  Neck:     Thyroid: No thyromegaly.  Cardiovascular:     Rate and Rhythm: Normal rate and regular rhythm.     Heart sounds: Normal heart sounds. No murmur heard.   Pulmonary:     Effort: Pulmonary effort is normal. No respiratory  distress.     Breath sounds: Normal breath sounds. No wheezing.  Musculoskeletal:        General:  No swelling.     Cervical back: Neck supple.  Skin:    General: Skin is warm and dry.     Comments: No rash noted  Neurological:     Mental Status: She is alert and oriented to person, place, and time.     Cranial Nerves: No cranial nerve deficit.     Comments: Bilateral UE/LE strength 5/5 PERRLA EOM intact  Psychiatric:        Behavior: Behavior normal.        Thought Content: Thought content normal.        Judgment: Judgment normal.           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 Clinically a stable.  Pruritis- trial of claritin 10mg  once daily.   Hx of c diff- clinically stable. Monitor.  This visit occurred during the SARS-CoV-2 public health emergency.  Safety protocols were in place, including screening questions prior to the visit, additional usage of staff PPE, and extensive cleaning of exam room while observing appropriate contact time as indicated for disinfecting solutions.

## 2019-12-28 NOTE — Patient Instructions (Signed)
Try adding claritin (loratidine) 10mg  once daily as needed for itching.

## 2019-12-31 ENCOUNTER — Telehealth: Payer: Self-pay | Admitting: Neurology

## 2019-12-31 MED ORDER — LEVETIRACETAM 500 MG PO TABS
500.0000 mg | ORAL_TABLET | Freq: Two times a day (BID) | ORAL | 1 refills | Status: DC
Start: 2019-12-31 — End: 2020-05-13

## 2019-12-31 NOTE — Telephone Encounter (Signed)
Rx(s) sent to pharmacy electronically.  Patient notified and voiced understanding.  

## 2019-12-31 NOTE — Telephone Encounter (Signed)
Yes, please refill the Keppra with one refill.  I had referred her to Dr. Felecia Shelling.  It looks like she has an appointment next month.

## 2019-12-31 NOTE — Telephone Encounter (Signed)
Patient wants to know if she is supposed to continue taking Levetiracetam? If so, she is needing a refill sent to [facility] [address]. She states she inquired about this "months ago and never heard back". Please call.

## 2020-01-14 DIAGNOSIS — D3132 Benign neoplasm of left choroid: Secondary | ICD-10-CM | POA: Diagnosis not present

## 2020-01-14 DIAGNOSIS — H40013 Open angle with borderline findings, low risk, bilateral: Secondary | ICD-10-CM | POA: Diagnosis not present

## 2020-01-14 DIAGNOSIS — H02831 Dermatochalasis of right upper eyelid: Secondary | ICD-10-CM | POA: Diagnosis not present

## 2020-01-14 DIAGNOSIS — H2513 Age-related nuclear cataract, bilateral: Secondary | ICD-10-CM | POA: Diagnosis not present

## 2020-01-14 DIAGNOSIS — H0100A Unspecified blepharitis right eye, upper and lower eyelids: Secondary | ICD-10-CM | POA: Diagnosis not present

## 2020-02-04 ENCOUNTER — Ambulatory Visit: Payer: Medicare Other | Admitting: Neurology

## 2020-02-04 ENCOUNTER — Encounter: Payer: Self-pay | Admitting: Neurology

## 2020-02-04 ENCOUNTER — Telehealth: Payer: Self-pay | Admitting: Neurology

## 2020-02-04 VITALS — BP 137/67 | HR 67 | Ht 61.0 in | Wt 117.5 lb

## 2020-02-04 DIAGNOSIS — I771 Stricture of artery: Secondary | ICD-10-CM

## 2020-02-04 DIAGNOSIS — G249 Dystonia, unspecified: Secondary | ICD-10-CM | POA: Insufficient documentation

## 2020-02-04 DIAGNOSIS — K529 Noninfective gastroenteritis and colitis, unspecified: Secondary | ICD-10-CM

## 2020-02-04 DIAGNOSIS — G0490 Encephalitis and encephalomyelitis, unspecified: Secondary | ICD-10-CM

## 2020-02-04 NOTE — Progress Notes (Signed)
[facility]  PATIENT: Danielle Fox DOB: 1950-09-06  REFERRING DOCTOR OR PCP: Metta Clines (neurology); Vivien Rossetti (PCP) SOURCE: Patient, notes from hospitalization, notes from Dr. Tomi Likens, extensive lab work and imaging results reviewed.  MRI images personally reviewed.  _________________________________   HISTORICAL  CHIEF COMPLAINT:  Chief Complaint  Patient presents with  . New Patient (Initial Visit)    Room 13. Referred by Metta Clines, DO.    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  She is a 69 year old woman who was fine until July 2021.  She began to have difficulty controlling her left arm 1 day that month.    She was in [place] at the time.  She still taking the and the was 1 week according to notes,she had an MRI showing subtle enhancement involving the right posterior frontal lobe and anterior right parietal lobe, which was thought to be subacute cortical infarcts.  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  Echocardiogram was reportedly unremarkable.  LDL was 128, Hgb A1c 6 and TSH 0.976.  She was discharged on ASA 81mg  and atorvastatin 40mg  daily.    She returned to [place] a few days later.  MRI of the brain with and without contrast 09/22/2019 showed flair hyperintense signal in the sulci in the superior right frontal convexity and the left cingulate sulcus.  At that time, there is no definite abnormal enhancement.  On 09/27/2019, she woke up and noted left-sided weakness and facial droop.  Symptoms were fluctuating as the day went on and she presented to [facility] where she was admitted for further evaluation.    CT head showed no acute intracranial abnormality.  CTA of head and neck again demonstrated approximately 60% stenosis of proximal  left subclavian artery MRI of brain with and without contrast showed FLAIR signal in the sulci over the superior right frontal convexity and left cingulate sulci, similar to her MRI from several days earlier.  There is also more subtle FLAIR changes in the left occipital lobe sulci.  She was admitted to the hospital.  Lumbar puncture was performed showing elevated cell count of 43, mildly elevated protein 50,  Glucose 42, mildly elevated IgG index 0.9, negative culture, negative cryptococcal antigen, negative Acid Fast culture, cytology with benign reactive changes of increased lymphocytes and monocytes.    She had 3 oligoclonal bands.  Serum labs include negative ANA, elevated sed rate 55, non-reactive HIV, low vitamin D 18.27, negative Ethanol, elevated sed rate 55, negative SARS coronavirus 2 PCR, negative Lyme, ACE 23, negative SSA/SSB antibodies, Hgb A1c 5.9, LDL 60.  Pending serum labs include RF, ANCA,  EBV PCR, RMSF antibodies.  UA and urine drug screen was negative.  EEG showed intermittent right hemispheric slowing.   As etiology was unknown and symptoms spontaneous improved she was not treated with steroids.  Infectious disease etiology was felt unlikely.  Since she was experiencing some dystonia (versus seizure) she was initially placed on gabapentin and then switched to Keppra 500 mg p.o. twice daily.  She continues on that.    She saw Dr. Tomi Likens as an outpatient.  A follow-up MRI 10/29/2019 showed some progression of the FLAIR sulcal abnormality, though she did not have any symptoms. She received IV Solumedrol in August x 5 days, she thinks she improved  further. She underwent a second lumbar puncture 11/08/2019.  The [facility] autoimmune myelopathy panel was negative.  HSV 1/2 were negative  Voltage gated potassium channel.    Cytology was negative.  She improved further and by September was back to baseline.  She had acute colitis in July/August 2021 felt to be due to E. Coli.  She was found to have an  ovarian cyst.   The CA125 was negative.    Abdominal/pelvic CT angiogram 10/08/2019 showed no evidence of significant mesenteric arterial occlusive disease.    There was new thickening and inflammation of the transverse and descending colon since the prior study 8 days ago with surrounding edema and a small amount of free fluid in the left pericolic gutter and dependent pelvis. Findings are consistent with acute colitis. No evidence of associated colonic perforation or focal abscess. The colon proximal to the segment of colitis demonstrates some dilatation.  Stable large cyst associated with the left adnexal region measuring up to 8.8 cm and demonstrating simple fluid density internally.     REVIEW OF SYSTEMS: Constitutional: No fevers, chills, sweats, or change in appetite Eyes: No visual changes, double vision, eye pain Ear, nose and throat: No hearing loss, ear pain, nasal congestion, sore throat Cardiovascular: No chest pain, palpitations Respiratory: No shortness of breath at rest or with exertion.   No wheezes GastrointestinaI: No nausea, vomiting, diarrhea, abdominal pain, fecal incontinence Genitourinary: No dysuria, urinary retention or frequency.  No nocturia. Musculoskeletal: No neck pain, back pain Integumentary: No rash, pruritus, skin lesions Neurological: as above Psychiatric: No depression at this time.  No anxiety Endocrine: No palpitations, diaphoresis, change in appetite, change in weigh or increased thirst Hematologic/Lymphatic: No anemia, purpura, petechiae. Allergic/Immunologic: No itchy/runny eyes, nasal congestion, recent allergic reactions, rashes  ALLERGIES: Allergies  Allergen Reactions  . Doxycycline Nausea And Vomiting  . Vancomycin     Nausea and abdominal pain  . Erythromycin Base Nausea Only    HOME MEDICATIONS:  Current Outpatient Medications:  .  acetaminophen (TYLENOL) 325 MG tablet, Take 650 mg by mouth every 6 (six) hours as needed for mild  pain. , Disp: , Rfl:  .  budesonide-formoterol (SYMBICORT) 80-4.5 MCG/ACT inhaler, Inhale 2 puffs into the lungs in the morning and at bedtime., Disp: 30.6 g, Rfl: 1 .  docusate sodium (COLACE) 100 MG capsule, Take 100 mg by mouth 2 (two) times daily., Disp: , Rfl:  .  esomeprazole (NEXIUM) 40 MG capsule, Take 1 capsule (40 mg total) by mouth daily before breakfast., Disp: 90 capsule, Rfl: 3 .  levETIRAcetam (KEPPRA) 500 MG tablet, Take 1 tablet (500 mg total) by mouth 2 (two) times daily., Disp: 60 tablet, Rfl: 1  PAST MEDICAL HISTORY: Past Medical History:  Diagnosis Date  . Allergy   . Clostridioides difficile infection 10/19/2018   tested 10/2018 negative  . Colon polyps   . Constipation 10/06/2014  . COPD (chronic obstructive pulmonary disease) ([place])   . Osteopenia   . 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 Preventative health care 06/29/2016  . Pruritus 06/29/2016  . Pyloric stenosis in adult 05/2019  . Welcome to Medicare preventive visit 06/29/2016    PAST SURGICAL HISTORY: Past Surgical History:  Procedure Laterality Date  . ABDOMINAL HYSTERECTOMY    . APPENDECTOMY    . COLONOSCOPY     01-14-2005  . SINUS IRRIGATION  04/02/2015  . UPPER GASTROINTESTINAL ENDOSCOPY  11/22/2018    FAMILY HISTORY: Family History  Problem  Relation Age of Onset  . Heart disease Mother        cabg, MI  . Alcohol abuse Father   . Cirrhosis Father   . Heart disease Father        MI  . Colon cancer Neg Hx   . Colon polyps Neg Hx   . Esophageal cancer Neg Hx   . Stomach cancer Neg Hx   . Rectal cancer Neg Hx     SOCIAL HISTORY:  Social History   Socioeconomic History  . Marital status: Significant Other    Spouse name: Not on file  . Number of children: 3  . Years of education: Not on file  . Highest education level: Not on file  Occupational History  . 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 Smoking status: Former Smoker    Packs/day: 1.00     Years: 48.00    Pack years: 48.00    Types: Cigarettes    Quit date: 02/19/2014    Years since quitting: 5.9  . Smokeless tobacco: Never Used  . Tobacco comment: Quit and has no desire to smoke again,  Vaping Use  . Vaping Use: Never used  Substance and Sexual Activity  . Alcohol use: Yes    Alcohol/week: 0.0 standard drinks    Comment: socially  . Drug use: No  . Sexual activity: Yes    Partners: Male  Other Topics Concern  . Not on file  Social History Narrative   Exercise-- walks dog on occasion   Right Handed   One Story Home   Does not drink caffeine    Social Determinants of Health   Financial Resource Strain:   . Difficulty of Paying Living Expenses: Not on file  Food Insecurity:   . Worried About Charity fundraiser in the Last Year: Not on file  . Ran Out of Food in the Last Year: Not on file  Transportation Needs:   . Lack of Transportation (Medical): Not on file  . Lack of Transportation (Non-Medical): Not on file  Physical Activity:   . Days of Exercise per Week: Not on file  . Minutes of Exercise per Session: Not on file  Stress:   . Feeling of Stress : Not on file  Social Connections:   . Frequency of Communication with Friends and Family: Not on file  . Frequency of Social Gatherings with Friends and Family: Not on file  . Attends Religious Services: Not on file  . Active Member of Clubs or Organizations: Not on file  . Attends Archivist Meetings: Not on file  . Marital Status: Not on file  Intimate Partner Violence:   . Fear of Current or Ex-Partner: Not on file  . Emotionally Abused: Not on file  . Physically Abused: Not on file  . Sexually Abused: Not on file     PHYSICAL EXAM  Vitals:   02/04/20 1032  BP: 137/67  Pulse: 67  Weight: 117 lb 8 oz (53.3 kg)  Height: 5\' 1"  (1.549 m)    Body mass index is 22.2 kg/m.   General: The patient is well-developed and well-nourished and in no acute distress  HEENT:  Head is [place]/AT.   Sclera are anicteric.  Funduscopic exam shows normal optic discs and retinal vessels.  Neck: Left carotid bruit/upper chest bruit noted.  The neck is nontender.  Cardiovascular: The heart has a regular rate and rhythm with a normal S1 and  S2. There were no murmurs, gallops or rubs.    Skin: Extremities are without rash or  edema.  Musculoskeletal:  Back is nontender  Neurologic Exam  Mental status: The patient is alert and oriented x 3 at the time of the examination. The patient has apparent normal recent and remote memory, with an apparently normal attention span and concentration ability.   Speech is normal.  Cranial nerves: Extraocular movements are full. Pupils are equal, round, and reactive to light and accomodation.  Visual fields are full.  Facial symmetry is present. There is good facial sensation to soft touch bilaterally.Facial strength is normal.  Trapezius and sternocleidomastoid strength is normal. No dysarthria is noted.  The tongue is midline, and the patient has symmetric elevation of the soft palate. No obvious hearing deficits are noted.  Motor:  Muscle bulk is normal.   Tone is normal. Strength is  5 / 5 in all 4 extremities.   Sensory: Sensory testing is intact to pinprick, soft touch and vibration sensation in all 4 extremities.  Coordination: Cerebellar testing reveals good finger-nose-finger and heel-to-shin bilaterally.  Gait and station: Station is normal.   Gait is normal. Tandem gait is normal. Romberg is negative.   Reflexes: Deep tendon reflexes are symmetric and normal bilaterally.   Plantar responses are flexor.    DIAGNOSTIC DATA (LABS, IMAGING, TESTING) - I reviewed patient records, labs, notes, testing and imaging myself where available.  Lab Results  Component Value Date   WBC 7.7 12/05/2019   HGB 10.6 (L) 12/05/2019   HCT 33.3 (L) 12/05/2019   MCV 80.5 12/05/2019   PLT 401.0 (H) 12/05/2019      Component Value Date/Time   NA 137 11/15/2019  1522   K 5.0 11/15/2019 1522   CL 102 11/15/2019 1522   CO2 26 11/15/2019 1522   GLUCOSE 86 11/15/2019 1522   BUN 17 11/15/2019 1522   CREATININE 0.74 11/15/2019 1522   CALCIUM 9.6 11/15/2019 1522   PROT 6.6 11/15/2019 1522   ALBUMIN 2.4 (L) 10/12/2019 0508   ALBUMIN 3.1 (L) 09/28/2019 1706   AST 15 11/15/2019 1522   ALT 26 11/15/2019 1522   ALKPHOS 78 10/12/2019 0508   BILITOT 0.9 11/15/2019 1522   GFRNONAA >60 10/12/2019 0508   GFRAA >60 10/12/2019 0508   Lab Results  Component Value Date   CHOL 121 09/28/2019   HDL 47 09/28/2019   LDLCALC 60 09/28/2019   LDLDIRECT 155.9 12/14/2012   TRIG 70 09/28/2019   CHOLHDL 2.6 09/28/2019   Lab Results  Component Value Date   HGBA1C 5.9 (H) 09/28/2019   Lab Results  Component Value Date   VITAMINB12 341 12/12/2019   Lab Results  Component Value Date   TSH 1.75 09/27/2018       ASSESSMENT AND PLAN  Cerebritis  Dystonia - Plan: MR BRAIN W WO CONTRAST  Encephalitis - Plan: Pan-ANCA, QuantiFERON-TB Gold Plus, Angiotensin converting enzyme  Subclavian arterial stenosis (HCC)  Colitis   In summary, Ms. Chandran is a 69 year old woman who had neurologic symptoms including left-sided weakness and dystonia associated with an abnormal brain MRI.  Specifically she had abnormal FLAIR signal in the sulci on the right superior frontal lobe, left cingulate gyrus and left occipital lobe.  There appeared to be some meningeal enhancement in this region as well.  I would be most concerned with vasculitis or sarcoid.  An autoimmune process cannot be ruled out.  Interestingly, around the same time she also had a new  onset colitis.  Rarely, vasculitic type changes in the brain can be seen with ulcerative colitis or Crohn's disease and that is also in the differential diagnosis.  Her evaluation has been very thorough.  Her neuro examination findings were generally mild compared to the extensive changes on brain MRI and she is now back to  baseline.  She has had no neurologic symptoms for the last 6 - 8 weeks.  I will check an MRI of the brain to determine if the abnormal findings are resolving.  If they are worsening we would need to consider a meningeal biopsy to determine if she has vasculitis.  I will also recheck some blood work including angiotensin-converting enzyme, ANCA, QuantiFERON-TB gold.  Concurrent with the CNS abnormalities she developed a colitis.  Rarely, ulcerative colitis or Crohn's disease can be associated with vasculitic type changes in the brain.  I will send a copy of this note to Dr. Carlean Purl as well as I am uncertain what type of colitis she has.  She will return to see me in 2 months or sooner if there are new or worsening neurologic symptoms.  We will let her know the results of the blood work and the MRI after they are performed.  She should call sooner if new or worsening neurologic symptoms.  Thank you for asking me to see Ms. Hunger.  Please let me know if I can be of further assistance with her or the patients in the future.  90-minute office visit with the majority of the time spent face-to-face for history and physical, discussion/counseling and decision-making.  Additional time with record review and documentation.   Richard A. Felecia Shelling, MD, [facility] 39/0/3009, 2:33 PM Certified in Neurology, Clinical Neurophysiology, Sleep Medicine and Neuroimaging  [facility] [address], [place], [place] [address] [phone]

## 2020-02-04 NOTE — Telephone Encounter (Signed)
[employer] medicare order sent to [facility]. No auth they will reach out to the patient to schedule.  

## 2020-02-06 LAB — PAN-ANCA
ANCA Proteinase 3: <3.5 U/mL (ref 0.0–3.5)
Atypical pANCA: <1:20 {titer}
C-ANCA: <1:20 {titer}
Myeloperoxidase Ab: <9 U/mL (ref 0.0–9.0)
P-ANCA: <1:20 {titer}

## 2020-02-06 LAB — ANGIOTENSIN CONVERTING ENZYME: Angio Convert Enzyme: 40 U/L (ref 14–82)

## 2020-02-06 LAB — QUANTIFERON-TB GOLD PLUS
QuantiFERON Mitogen Value: >10 IU/mL
QuantiFERON Nil Value: 0.01 IU/mL
QuantiFERON TB1 Ag Value: 0.03 IU/mL
QuantiFERON TB2 Ag Value: 0.02 IU/mL
QuantiFERON-TB Gold Plus: NEGATIVE

## 2020-02-13 ENCOUNTER — Ambulatory Visit
Admission: RE | Admit: 2020-02-13 | Discharge: 2020-02-13 | Disposition: A | Discharge status: Home / Self Care | Payer: Medicare Other | Source: Ambulatory Visit | Attending: Neurology | Admitting: Neurology

## 2020-02-13 ENCOUNTER — Other Ambulatory Visit: Payer: Self-pay

## 2020-02-13 DIAGNOSIS — G249 Dystonia, unspecified: Secondary | ICD-10-CM

## 2020-02-13 MED ORDER — GADOBENATE DIMEGLUMINE 529 MG/ML IV SOLN
10.0000 mL | Freq: Once | INTRAVENOUS | Status: AC | PRN
  Administered 2020-02-13: 10 mL via INTRAVENOUS

## 2020-02-18 DIAGNOSIS — H04123 Dry eye syndrome of bilateral lacrimal glands: Secondary | ICD-10-CM | POA: Diagnosis not present

## 2020-02-18 DIAGNOSIS — H0100A Unspecified blepharitis right eye, upper and lower eyelids: Secondary | ICD-10-CM | POA: Diagnosis not present

## 2020-02-18 DIAGNOSIS — H40013 Open angle with borderline findings, low risk, bilateral: Secondary | ICD-10-CM | POA: Diagnosis not present

## 2020-03-04 ENCOUNTER — Other Ambulatory Visit: Payer: Self-pay

## 2020-03-04 ENCOUNTER — Other Ambulatory Visit: Payer: Self-pay | Admitting: Obstetrics and Gynecology

## 2020-03-04 ENCOUNTER — Encounter: Payer: Self-pay | Admitting: Obstetrics and Gynecology

## 2020-03-04 ENCOUNTER — Ambulatory Visit (INDEPENDENT_AMBULATORY_CARE_PROVIDER_SITE_OTHER): Payer: Medicare Other

## 2020-03-04 ENCOUNTER — Ambulatory Visit (INDEPENDENT_AMBULATORY_CARE_PROVIDER_SITE_OTHER): Payer: Medicare Other | Admitting: Obstetrics and Gynecology

## 2020-03-04 VITALS — BP 92/50 | HR 70 | Resp 14 | Ht 61.0 in | Wt 117.0 lb

## 2020-03-04 DIAGNOSIS — N9489 Other specified conditions associated with female genital organs and menstrual cycle: Secondary | ICD-10-CM | POA: Diagnosis not present

## 2020-03-04 NOTE — Progress Notes (Signed)
GYNECOLOGY  VISIT   HPI: 70 y.o.   Significant Other White or Caucasian Not Hispanic or Latino  female   [phone] with No LMP recorded. Patient has had a hysterectomy.   here for follow up ultrasound of an incidental finding of a large left adnexal cyst. H/O hysterectomy in 1998, still has her tubes and ovaries.   She was originally seen in 10/21 after a CT scan for colitis revealed an 8.8 cm left adnexal cyst. Ultrasound on 12/06/19 revealed a 9.3 overall simple appearing left adnexal cyst. There were 2 other small cysts within the larger cyst (vs thin septations). The other cysts measured 2.4 and 1.3 cm. No blood flow.  CA 125 was normal at 13.4 U/ml.  The patient wants to avoid surgery. I reviewed the patient with Dr Berline Lopes in Oncology who felt it was reasonable to do f/u imaging as long as the patient understood the small risk of missing significant pathology.   GYNECOLOGIC HISTORY: No LMP recorded. Patient has had a hysterectomy. Contraception: PMP Menopausal hormone therapy: none        OB History    Gravida  3   Para  3   Term  3   Preterm      AB      Living  3     SAB      IAB      Ectopic      Multiple      Live Births  3              Patient Active Problem List   Diagnosis Date Noted  . Dystonia 02/04/2020  . Subclavian arterial stenosis ([place]) 02/04/2020  . Back pain 11/16/2019  . Nausea and vomiting 10/26/2019  . Ischemic colitis ([place])   . Colitis 10/08/2019  . Lower abdominal pain   . Abnormal CT scan, colon   . Adnexal mass   . 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 Encephalitis 09/27/2019  . H/O: CVA (cerebrovascular accident) 09/19/2019  . Hypokalemia 09/19/2019  . Clostridioides difficile infection   . Diarrhea 09/24/2018  . Vitamin D deficiency 01/02/2017  . Welcome to Medicare preventive visit 06/29/2016  . Pruritus 06/29/2016  . Abnormal CT scan 08/14/2015  . Chronic pansinusitis 04/11/2015  . Constipation 10/06/2014  . Chronic UTI  09/08/2014  . Rib pain on left side 09/08/2014  . Chronic cough 06/14/2014  . SOB (shortness of breath) 06/14/2014  . Cough 05/20/2014  . Hyperlipidemia, mild 08/15/2011  . Osteopenia 03/01/2011  . History of smoking 03/01/2011  . Musculoskeletal pain 03/01/2011    Past Medical History:  Diagnosis Date  . Allergy   . Clostridioides difficile infection 10/19/2018   tested 10/2018 negative  . Colon polyps   . Constipation 10/06/2014  . COPD (chronic obstructive pulmonary disease) ([place])   . Osteopenia   . 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 Preventative health care 06/29/2016  . Pruritus 06/29/2016  . Pyloric stenosis in adult 05/2019  . Welcome to Medicare preventive visit 06/29/2016    Past Surgical History:  Procedure Laterality Date  . ABDOMINAL HYSTERECTOMY    . APPENDECTOMY    . COLONOSCOPY     01-14-2005  . SINUS IRRIGATION  04/02/2015  . UPPER GASTROINTESTINAL ENDOSCOPY  11/22/2018    Current Outpatient Medications  Medication Sig Dispense Refill  . acetaminophen (TYLENOL) 325 MG tablet Take 650 mg by mouth every 6 (six) hours as needed for mild pain.     Marland Kitchen  budesonide-formoterol (SYMBICORT) 80-4.5 MCG/ACT inhaler Inhale 2 puffs into the lungs in the morning and at bedtime. 30.6 g 1  . docusate sodium (COLACE) 100 MG capsule Take 100 mg by mouth 2 (two) times daily.    Marland Kitchen esomeprazole (NEXIUM) 40 MG capsule Take 1 capsule (40 mg total) by mouth daily before breakfast. 90 capsule 3  . levETIRAcetam (KEPPRA) 500 MG tablet Take 1 tablet (500 mg total) by mouth 2 (two) times daily. 60 tablet 1   No current facility-administered medications for this visit.     ALLERGIES: Doxycycline, Vancomycin, and Erythromycin base  Family History  Problem Relation Age of Onset  . Heart disease Mother        cabg, MI  . Alcohol abuse Father   . Cirrhosis Father   . Heart disease Father        MI  . Colon cancer Neg Hx   . Colon polyps Neg Hx   . Esophageal cancer Neg Hx   .  Stomach cancer Neg Hx   . Rectal cancer Neg Hx     Social History   Socioeconomic History  . Marital status: Significant Other    Spouse name: Not on file  . Number of children: 3  . Years of education: Not on file  . Highest education level: Not on file  Occupational History  . 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 Smoking status: Former Smoker    Packs/day: 1.00    Years: 48.00    Pack years: 48.00    Types: Cigarettes    Quit date: 02/19/2014    Years since quitting: 6.0  . Smokeless tobacco: Never Used  . Tobacco comment: Quit and has no desire to smoke again,  Vaping Use  . Vaping Use: Never used  Substance and Sexual Activity  . Alcohol use: Yes    Alcohol/week: 0.0 standard drinks    Comment: socially  . Drug use: No  . Sexual activity: Yes    Partners: Male  Other Topics Concern  . Not on file  Social History Narrative   Exercise-- walks dog on occasion   Right Handed   One Story Home   Does not drink caffeine    Social Determinants of Health   Financial Resource Strain: Not on file  Food Insecurity: Not on file  Transportation Needs: Not on file  Physical Activity: Not on file  Stress: Not on file  Social Connections: Not on file  Intimate Partner Violence: Not on file    Review of Systems  All other systems reviewed and are negative.   PHYSICAL EXAMINATION:    BP (!) 92/50 (BP Location: Right Arm, Patient Position: Sitting, Cuff Size: Normal)   Pulse 70   Resp 14   Ht 5\' 1"  (1.549 m)   Wt 117 lb (53.1 kg)   BMI 22.11 kg/m     General appearance: alert, cooperative and appears stated age  Ultrasound images reviewed with the patient. There is a stable cystic mass in the left adnexa, measures 9.4 x 8.8 x 7.7 cm, 2 smaller cysts within the larger cyst (2.2 cm and 1.79 cm). No vascular flow. No free fluid.    1. Adnexal mass 9.4 cm cystic left adnexal mass, stable from ultrasound 3 months ago, normal CA  125. I have previously reviewed this patient with Dr in GYN Oncology whom felt ultrasound f/u was reasonable (the patient declines surgery) - Pricilla Holm PELVIS TRANSVAGINAL NON-OB (  TV ONLY); Future. Ordered for 3 months. If that is stable will check again in 6 months and then again yearly. Patient is aware of the small chance of missing significant pathology.

## 2020-03-24 ENCOUNTER — Other Ambulatory Visit: Payer: Self-pay | Admitting: Neurology

## 2020-04-01 ENCOUNTER — Ambulatory Visit (INDEPENDENT_AMBULATORY_CARE_PROVIDER_SITE_OTHER): Payer: Medicare Other | Admitting: Neurology

## 2020-04-01 ENCOUNTER — Telehealth: Payer: Self-pay | Admitting: Neurology

## 2020-04-01 ENCOUNTER — Other Ambulatory Visit: Payer: Self-pay

## 2020-04-01 ENCOUNTER — Encounter: Payer: Self-pay | Admitting: Neurology

## 2020-04-01 VITALS — BP 95/60 | HR 71 | Ht 61.0 in | Wt 122.0 lb

## 2020-04-01 DIAGNOSIS — I682 Cerebral arteritis in other diseases classified elsewhere: Secondary | ICD-10-CM

## 2020-04-01 DIAGNOSIS — K529 Noninfective gastroenteritis and colitis, unspecified: Secondary | ICD-10-CM

## 2020-04-01 DIAGNOSIS — G249 Dystonia, unspecified: Secondary | ICD-10-CM

## 2020-04-01 DIAGNOSIS — G0490 Encephalitis and encephalomyelitis, unspecified: Secondary | ICD-10-CM

## 2020-04-01 NOTE — Telephone Encounter (Signed)
[employer] medicare order sent to [facility]. No auth they will reach out to the patient to schedule.  

## 2020-04-01 NOTE — Progress Notes (Signed)
[facility]  PATIENT: Danielle Fox DOB: 09/24/1950  REFERRING DOCTOR OR PCP: Metta Clines (neurology); Vivien Rossetti (PCP) SOURCE: Patient, notes from hospitalization, notes from Dr. Tomi Likens, extensive lab work and imaging results reviewed.  MRI images personally reviewed.  _________________________________   HISTORICAL  CHIEF COMPLAINT:  Chief Complaint  Patient presents with  . Follow-up    RM 12. Last seen 02/04/20. Doing well, no new sx   Update 04/01/2020: She is doing very well and feels she is completely back to her baseline..  She has had no further decline or new symptoms.  She is still on Keppra for left-sided phasic spasms vs. Seizure.   She has had no further episodes since September.     She has an appointment with Dr. Curtis Sites at [facility] (movement disorders) but is thinking about cancelling since she is back to baseline.     I showed her the MRI images and we discussed them in detail.  MRI 02/13/2020 showed resolution of the abnormal signal within the right frontal and left parieto-occipital lobes within the sulci and the associated abnormal pachymeningeal enhancement.  However, there continues to be some asymmetry of the sulci, being more prominent on the left than the right.  This could be incidental or a sequela of the previous abnormalities.    No acute findings.  Normal enhancement pattern.  LAbs:  Quant-TB, PAN_ANCA, ACE were normal/negative   History of neurologic symptoms: She was fine until July 2021.  She began to have difficulty controlling her left arm 1 day that month.    She was in [place] at the time.  She still taking the and the was 1 week according to notes,she had an MRI showing subtle enhancement involving the right posterior frontal lobe and anterior right parietal lobe, which was thought to be subacute cortical infarcts.  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  Echocardiogram  was reportedly unremarkable.  LDL was 128, Hgb A1c 6 and TSH 0.976.  She was discharged on ASA 81mg  and atorvastatin 40mg  daily.     She returned to [place] a few days later.  MRI of the brain with and without contrast 09/22/2019 showed flair hyperintense signal in the sulci in the superior right frontal convexity and the left cingulate sulcus.  At that time, there is no definite abnormal enhancement.  On 09/27/2019, she woke up and noted left-sided weakness and facial droop.  Symptoms were fluctuating as the day went on and she presented to [facility] where she was admitted for further evaluation.    CT head showed no acute intracranial abnormality.  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  There is also more subtle FLAIR changes in the left occipital lobe sulci.  She was admitted to the hospital.  Lumbar puncture was performed showing elevated cell count of 43, mildly elevated protein 50,  Glucose 42, mildly elevated IgG index 0.9, negative culture, negative cryptococcal antigen, negative Acid Fast culture, cytology with benign reactive changes of increased lymphocytes and monocytes.    She had 3 oligoclonal bands.  Serum labs include negative ANA, elevated sed rate 55, non-reactive HIV, low vitamin D 18.27, negative Ethanol, elevated sed rate 55, negative  SARS coronavirus 2 PCR, negative Lyme, ACE 23, negative SSA/SSB antibodies, Hgb A1c 5.9, LDL 60.  Pending serum labs include RF, ANCA,  EBV PCR, RMSF antibodies.  UA and urine drug screen was negative.  EEG showed intermittent right hemispheric slowing.   As etiology was unknown and symptoms spontaneous improved she was not treated with steroids.  Infectious disease etiology was felt unlikely.  Since she was  experiencing some dystonia (versus seizure) she was initially placed on gabapentin and then switched to Keppra 500 mg p.o. twice daily.  She continues on that.    Follow-up MRI 10/29/2019 showed some progression of the FLAIR sulcal abnormality, though she did not have any symptoms. She received IV Solumedrol in August x 5 days, she thinks she improved further. She underwent a second lumbar puncture 11/08/2019.  The [facility] autoimmune myelopathy panel was negative.  HSV 1/2 were negative  Voltage gated potassium channel.    Cytology was negative.  She improved further and by September was back to baseline.  She had acute colitis in July/August 2021 felt to be due to E. Coli.  She was found to have an ovarian cyst.   The CA125 was negative.    Abdominal/pelvic CT angiogram 10/08/2019  Findings are consistent with acute colitis. No evidence of associated colonic perforation or focal abscess.    REVIEW OF SYSTEMS: Constitutional: No fevers, chills, sweats, or change in appetite Eyes: No visual changes, double vision, eye pain Ear, nose and throat: No hearing loss, ear pain, nasal congestion, sore throat Cardiovascular: No chest pain, palpitations Respiratory: No shortness of breath at rest or with exertion.   No wheezes GastrointestinaI: No nausea, vomiting, diarrhea, abdominal pain, fecal incontinence Genitourinary: No dysuria, urinary retention or frequency.  No nocturia. Musculoskeletal: No neck pain, back pain Integumentary: No rash, pruritus, skin lesions Neurological: as above Psychiatric: No depression at this time.  No anxiety Endocrine: No palpitations, diaphoresis, change in appetite, change in weigh or increased thirst Hematologic/Lymphatic: No anemia, purpura, petechiae. Allergic/Immunologic: No itchy/runny eyes, nasal congestion, recent allergic reactions, rashes  ALLERGIES: Allergies  Allergen Reactions  . Doxycycline Nausea And Vomiting  . Vancomycin     Nausea and abdominal pain   . Erythromycin Base Nausea Only    HOME MEDICATIONS:  Current Outpatient Medications:  .  acetaminophen (TYLENOL) 325 MG tablet, Take 650 mg by mouth every 6 (six) hours as needed for mild pain. , Disp: , Rfl:  .  budesonide-formoterol (SYMBICORT) 80-4.5 MCG/ACT inhaler, Inhale 2 puffs into the lungs in the morning and at bedtime., Disp: 30.6 g, Rfl: 1 .  docusate sodium (COLACE) 100 MG capsule, Take 100 mg by mouth 2 (two) times daily., Disp: , Rfl:  .  esomeprazole (NEXIUM) 40 MG capsule, Take 1 capsule (40 mg total) by mouth daily before breakfast., Disp: 90 capsule, Rfl: 3 .  levETIRAcetam (KEPPRA) 500 MG tablet, Take 1 tablet (500 mg total) by mouth 2 (two) times daily., Disp: 60 tablet, Rfl: 1  PAST MEDICAL HISTORY: Past Medical History:  Diagnosis Date  . Allergy   . Clostridioides difficile infection 10/19/2018   tested 10/2018 negative  . Colon polyps   . Constipation 10/06/2014  . COPD (chronic obstructive pulmonary disease) ([place])   . Osteopenia   . 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 Preventative health care 06/29/2016  . Pruritus 06/29/2016  . Pyloric stenosis in adult 05/2019  . Welcome to Medicare preventive visit 06/29/2016    PAST SURGICAL  HISTORY: Past Surgical History:  Procedure Laterality Date  . ABDOMINAL HYSTERECTOMY    . APPENDECTOMY    . COLONOSCOPY     01-14-2005  . SINUS IRRIGATION  04/02/2015  . UPPER GASTROINTESTINAL ENDOSCOPY  11/22/2018    FAMILY HISTORY: Family History  Problem Relation Age of Onset  . Heart disease Mother        cabg, MI  . Alcohol abuse Father   . Cirrhosis Father   . Heart disease Father        MI  . Colon cancer Neg Hx   . Colon polyps Neg Hx   . Esophageal cancer Neg Hx   . Stomach cancer Neg Hx   . Rectal cancer Neg Hx     SOCIAL HISTORY:  Social History   Socioeconomic History  . Marital status: Significant Other    Spouse name: Not on file  . Number of children: 3  . Years of education: Not on file   . Highest education level: Not on file  Occupational History  . 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 Smoking status: Former Smoker    Packs/day: 1.00    Years: 48.00    Pack years: 48.00    Types: Cigarettes    Quit date: 02/19/2014    Years since quitting: 6.1  . Smokeless tobacco: Never Used  . Tobacco comment: Quit and has no desire to smoke again,  Vaping Use  . Vaping Use: Never used  Substance and Sexual Activity  . Alcohol use: Yes    Alcohol/week: 0.0 standard drinks    Comment: socially  . Drug use: No  . Sexual activity: Yes    Partners: Male  Other Topics Concern  . Not on file  Social History Narrative   Exercise-- walks dog on occasion   Right Handed   One Story Home   Does not drink caffeine    Social Determinants of Health   Financial Resource Strain: Not on file  Food Insecurity: Not on file  Transportation Needs: Not on file  Physical Activity: Not on file  Stress: Not on file  Social Connections: Not on file  Intimate Partner Violence: Not on file     PHYSICAL EXAM  Vitals:   04/01/20 1039  BP: 95/60  Pulse: 71  SpO2: 96%  Weight: 122 lb (55.3 kg)  Height: 5\' 1"  (1.549 m)    Body mass index is 23.05 kg/m.   General: The patient is well-developed and well-nourished and in no acute distress  HEENT:  Head is [place]/AT.  Sclera are anicteric.    Skin: Extremities are without rash or  edema.  Musculoskeletal:  Back is nontender  Neurologic Exam  Mental status: The patient is alert and oriented x 3 at the time of the examination. The patient has apparent normal recent and remote memory, with an apparently normal attention span and concentration ability.   Speech is normal.  Cranial nerves: Extraocular movements are full.  Facial strength and sensation was normal.  Hearing was symmetric and normal. Motor:  Muscle bulk is normal.   Tone is normal. Strength is  5 / 5 in all 4 extremities.   Sensory:  Sensory testing is intact to pinprick, soft touch and vibration sensation in all 4 extremities.  Coordination: Cerebellar testing reveals good finger-nose-finger and heel-to-shin bilaterally.  Gait and station: Station is normal.   Gait is normal. Tandem gait is minimally wide. Romberg is negative.  Reflexes: Deep tendon reflexes are symmetric and normal bilaterally.       DIAGNOSTIC DATA (LABS, IMAGING, TESTING) - I reviewed patient records, labs, notes, testing and imaging myself where available.  Lab Results  Component Value Date   WBC 7.7 12/05/2019   HGB 10.6 (L) 12/05/2019   HCT 33.3 (L) 12/05/2019   MCV 80.5 12/05/2019   PLT 401.0 (H) 12/05/2019      Component Value Date/Time   NA 137 11/15/2019 1522   K 5.0 11/15/2019 1522   CL 102 11/15/2019 1522   CO2 26 11/15/2019 1522   GLUCOSE 86 11/15/2019 1522   BUN 17 11/15/2019 1522   CREATININE 0.74 11/15/2019 1522   CALCIUM 9.6 11/15/2019 1522   PROT 6.6 11/15/2019 1522   ALBUMIN 2.4 (L) 10/12/2019 0508   ALBUMIN 3.1 (L) 09/28/2019 1706   AST 15 11/15/2019 1522   ALT 26 11/15/2019 1522   ALKPHOS 78 10/12/2019 0508   BILITOT 0.9 11/15/2019 1522   GFRNONAA >60 10/12/2019 0508   GFRAA >60 10/12/2019 0508   Lab Results  Component Value Date   CHOL 121 09/28/2019   HDL 47 09/28/2019   LDLCALC 60 09/28/2019   LDLDIRECT 155.9 12/14/2012   TRIG 70 09/28/2019   CHOLHDL 2.6 09/28/2019   Lab Results  Component Value Date   HGBA1C 5.9 (H) 09/28/2019   Lab Results  Component Value Date   VITAMINB12 341 12/12/2019   Lab Results  Component Value Date   TSH 1.75 09/27/2018       ASSESSMENT AND PLAN  Cerebritis  Colitis  Dystonia  Encephalitis  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   She is doin well - back to baseline 2.    Etiology uncertain - vasculitis?? Perhaps associated with inflammatory bowel disease. 3.   Taper Keppra off over a couple  weeks 4.   F/u in 5 months -- MRI 1-2 weeks before next visit.  She should call sooner if new or worsening neurologic symptoms.   Kaleb Sek A. Felecia Shelling, MD, [facility] 03/04/4313, 40:08 AM Certified in Neurology, Clinical Neurophysiology, Sleep Medicine and Neuroimaging  [facility] [address] [place], [place] [address] [phone]

## 2020-04-10 NOTE — Progress Notes (Signed)
Thank you.  She's a very unusual neuro case.

## 2020-04-21 ENCOUNTER — Encounter: Payer: Self-pay | Admitting: Family Medicine

## 2020-04-21 ENCOUNTER — Telehealth (INDEPENDENT_AMBULATORY_CARE_PROVIDER_SITE_OTHER): Payer: Medicare Other | Admitting: Family Medicine

## 2020-04-21 DIAGNOSIS — J441 Chronic obstructive pulmonary disease with (acute) exacerbation: Secondary | ICD-10-CM | POA: Diagnosis not present

## 2020-04-21 MED ORDER — FLUCONAZOLE 150 MG PO TABS: ORAL_TABLET | ORAL | 0 refills | Status: DC

## 2020-04-21 MED ORDER — AMOXICILLIN-POT CLAVULANATE 875-125 MG PO TABS: 1.0000 | ORAL_TABLET | Freq: Two times a day (BID) | ORAL | 0 refills | Status: AC

## 2020-04-21 MED ORDER — PREDNISONE 20 MG PO TABS: 40.0000 mg | ORAL_TABLET | Freq: Every day | ORAL | 0 refills | Status: AC

## 2020-04-21 NOTE — Progress Notes (Signed)
Chief Complaint  Patient presents with  . Cough    Nasal congestion   . Fever    Danielle Fox here for URI complaints. Due to COVID-19 pandemic, we are interacting via web portal for an electronic face-to-face visit. I verified patient's ID using 2 identifiers. Patient agreed to proceed with visit via this method. Patient is at home, I am at office. Patient and I are present for visit.   Duration: 8 days  Associated symptoms: sinus congestion, rhinorrhea, wheezing, shortness of breath and low grade fevers, cough Denies: sinus pain, itchy watery eyes, ear pain, ear drainage, sore throat, myalgia and N/V/D Treatment to date: OTC meds Sick contacts: No  She has not been tested for covid.  She is triple vaccinated.   Past Medical History:  Diagnosis Date  . Allergy   . Clostridioides difficile infection 10/19/2018   tested 10/2018 negative  . Colon polyps   . Constipation 10/06/2014  . COPD (chronic obstructive pulmonary disease) ([place])   . Osteopenia   . 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 Preventative health care 06/29/2016  . Pruritus 06/29/2016  . Pyloric stenosis in adult 05/2019  . Welcome to Medicare preventive visit 06/29/2016   Objective No conversational dyspnea Age appropriate judgment and insight Nml affect and mood  COPD exacerbation (HCC) - Plan: predniSONE (DELTASONE) 20 MG tablet, amoxicillin-clavulanate (AUGMENTIN) 875-125 MG tablet, fluconazole (DIFLUCAN) 150 MG tablet  Tx for COPD exacerbation. Allergy to macrolides and doxy.  Continue to push fluids, practice good hand hygiene, cover mouth when coughing. F/u prn. If starting to experience fevers, shaking, or shortness of breath, seek immediate care. Pt voiced understanding and agreement to the plan.  [place], DO 04/21/20 10:13 AM

## 2020-05-13 ENCOUNTER — Ambulatory Visit (INDEPENDENT_AMBULATORY_CARE_PROVIDER_SITE_OTHER): Payer: Medicare Other | Admitting: Family Medicine

## 2020-05-13 ENCOUNTER — Encounter: Payer: Self-pay | Admitting: Family Medicine

## 2020-05-13 ENCOUNTER — Other Ambulatory Visit: Payer: Self-pay

## 2020-05-13 VITALS — BP 96/60 | HR 91 | Temp 97.7°F | Resp 16 | Wt 124.2 lb

## 2020-05-13 DIAGNOSIS — D649 Anemia, unspecified: Secondary | ICD-10-CM | POA: Diagnosis not present

## 2020-05-13 DIAGNOSIS — E876 Hypokalemia: Secondary | ICD-10-CM | POA: Diagnosis not present

## 2020-05-13 DIAGNOSIS — M8580 Other specified disorders of bone density and structure, unspecified site: Secondary | ICD-10-CM

## 2020-05-13 DIAGNOSIS — E559 Vitamin D deficiency, unspecified: Secondary | ICD-10-CM | POA: Diagnosis not present

## 2020-05-13 DIAGNOSIS — G0490 Encephalitis and encephalomyelitis, unspecified: Secondary | ICD-10-CM

## 2020-05-13 LAB — COMPREHENSIVE METABOLIC PANEL
ALT: 25 U/L (ref 0–35)
AST: 19 U/L (ref 0–37)
Albumin: 3.9 g/dL (ref 3.5–5.2)
Alkaline Phosphatase: 140 U/L — ABNORMAL HIGH (ref 39–117)
BUN: 19 mg/dL (ref 6–23)
CO2: 30 mEq/L (ref 19–32)
Calcium: 9.5 mg/dL (ref 8.4–10.5)
Chloride: 105 mEq/L (ref 96–112)
Creatinine, Ser: 0.8 mg/dL (ref 0.40–1.20)
GFR: 74.95 mL/min (ref >60.00)
Glucose, Bld: 89 mg/dL (ref 70–99)
Potassium: 4.5 mEq/L (ref 3.5–5.1)
Sodium: 140 mEq/L (ref 135–145)
Total Bilirubin: 0.6 mg/dL (ref 0.2–1.2)
Total Protein: 6.6 g/dL (ref 6.0–8.3)

## 2020-05-13 LAB — VITAMIN D 25 HYDROXY (VIT D DEFICIENCY, FRACTURES): VITD: 29.83 ng/mL — ABNORMAL LOW (ref 30.00–100.00)

## 2020-05-13 LAB — CBC
HCT: 38.6 % (ref 36.0–46.0)
Hemoglobin: 12.7 g/dL (ref 12.0–15.0)
MCHC: 33 g/dL (ref 30.0–36.0)
MCV: 83.6 fl (ref 78.0–100.0)
Platelets: 251 10*3/uL (ref 150.0–400.0)
RBC: 4.61 Mil/uL (ref 3.87–5.11)
RDW: 17.9 % — ABNORMAL HIGH (ref 11.5–15.5)
WBC: 7.3 10*3/uL (ref 4.0–10.5)

## 2020-05-13 LAB — TSH: TSH: 1.86 u[IU]/mL (ref 0.35–4.50)

## 2020-05-13 NOTE — Patient Instructions (Signed)
[employer] medicine (25th ed., pp. [phone]). [place], [place]: Elsevier.">  Anemia  Anemia is a condition in which there is not enough red blood cells or hemoglobin in the blood. Hemoglobin is a substance in red blood cells that carries oxygen. When you do not have enough red blood cells or hemoglobin (are anemic), your body cannot get enough oxygen and your organs may not work properly. As a result, you may feel very tired or have other problems. What are the causes? Common causes of anemia include:  Excessive bleeding. Anemia can be caused by excessive bleeding inside or outside the body, including bleeding from the intestines or from heavy menstrual periods in females.  Poor nutrition.  Long-lasting (chronic) kidney, thyroid, and liver disease.  Bone marrow disorders, spleen problems, and blood disorders.  Cancer and treatments for cancer.  HIV (human immunodeficiency virus) and AIDS (acquired immunodeficiency syndrome).  Infections, medicines, and autoimmune disorders that destroy red blood cells. What are the signs or symptoms? Symptoms of this condition include:  Minor weakness.  Dizziness.  Headache, or difficulties concentrating and sleeping.  Heartbeats that feel irregular or faster than normal (palpitations).  Shortness of breath, especially with exercise.  Pale skin, lips, and nails, or cold hands and feet.  Indigestion and nausea. Symptoms may occur suddenly or develop slowly. If your anemia is mild, you may not have symptoms. How is this diagnosed? This condition is diagnosed based on blood tests, your medical history, and a physical exam. In some cases, a test may be needed in which cells are removed from the soft tissue inside of a bone and looked at under a microscope (bone marrow biopsy). Your health care provider may also check your stool (feces) for blood and may do additional testing to look for the cause of your bleeding. Other tests may  include:  Imaging tests, such as a CT scan or MRI.  A procedure to see inside your esophagus and stomach (endoscopy).  A procedure to see inside your colon and rectum (colonoscopy). How is this treated? Treatment for this condition depends on the cause. If you continue to lose a lot of blood, you may need to be treated at a hospital. Treatment may include:  Taking supplements of iron, vitamin Q68, or folic acid.  Taking a hormone medicine (erythropoietin) that can help to stimulate red blood cell growth.  Having a blood transfusion. This may be needed if you lose a lot of blood.  Making changes to your diet.  Having surgery to remove your spleen. Follow these instructions at home:  Take over-the-counter and prescription medicines only as told by your health care provider.  Take supplements only as told by your health care provider.  Follow any diet instructions that you were given by your health care provider.  Keep all follow-up visits as told by your health care provider. This is important. Contact a health care provider if:  You develop new bleeding anywhere in the body. Get help right away if:  You are very weak.  You are short of breath.  You have pain in your abdomen or chest.  You are dizzy or feel faint.  You have trouble concentrating.  You have bloody stools, black stools, or tarry stools.  You vomit repeatedly or you vomit up blood. These symptoms may represent a serious problem that is an emergency. Do not wait to see if the symptoms will go away. Get medical help right away. Call your local emergency services (911 in the [place] Do not  drive yourself to the hospital. Summary  Anemia is a condition in which you do not have enough red blood cells or enough of a substance in your red blood cells that carries oxygen (hemoglobin).  Symptoms may occur suddenly or develop slowly.  If your anemia is mild, you may not have symptoms.  This condition is  diagnosed with blood tests, a medical history, and a physical exam. Other tests may be needed.  Treatment for this condition depends on the cause of the anemia. This information is not intended to replace advice given to you by your health care provider. Make sure you discuss any questions you have with your health care provider. Document Revised: 01/23/2019 Document Reviewed: 01/23/2019 Elsevier Patient Education  2021 [employer] Inc.  

## 2020-05-13 NOTE — Progress Notes (Signed)
Patient ID: DEZTINEE LOHMEYER, female    DOB: February 12, 1951  Age: 70 y.o. MRN: [id number]    Subjective:  Subjective  HPI Miciah KALYANI MAEDA presents for office visit today. She states that she is feeling well and feels like she is "finally getting back of her old self". She notes that she recently stopped taking Keppra last month, and she is feeling great. Her Neurologist Dr. Felecia Shelling at [facility] states that her MRI readings have improved on 04/01/2020. The patient has a PMHx of Cerebritis. She denies any new complaints. She denies any chest pain, SOB, fever, abdominal pain, cough, chills, sore throat, dysuria, urinary incontinence, back pain, HA visual changes, ear pain, fatigue, or N/VD.    She states that she has been active and is currently preparing for her upcoming birthday. She is hopeful.    Review of Systems  Constitutional: Negative for chills, fatigue and fever.  HENT: Negative for congestion, ear pain, rhinorrhea, sinus pain and sore throat.   Eyes: Negative for pain.  Respiratory: Negative for cough and shortness of breath.   Cardiovascular: Negative for chest pain and palpitations.  Gastrointestinal: Negative for abdominal pain, blood in stool, diarrhea, nausea and vomiting.  Genitourinary: Negative for flank pain, hematuria, vaginal bleeding, vaginal discharge and vaginal pain.  Musculoskeletal: Negative for back pain and myalgias.  Skin: Negative for rash.  Neurological: Negative for dizziness and headaches.    History Past Medical History:  Diagnosis Date  . Allergy   . Clostridioides difficile infection 10/19/2018   tested 10/2018 negative  . Colon polyps   . Constipation 10/06/2014  . COPD (chronic obstructive pulmonary disease) ([place])   . Osteopenia   . 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 Preventative health care 06/29/2016  . Pruritus 06/29/2016  . Pyloric stenosis in adult 05/2019  . Welcome to Medicare preventive visit 06/29/2016    She has a  past surgical history that includes Appendectomy; Colonoscopy; Sinus Irrigation (04/02/2015); Upper gastrointestinal endoscopy (11/22/2018); and Abdominal hysterectomy.   Her family history includes Alcohol abuse in her father; Cirrhosis in her father; Heart disease in her father and mother.She reports that she quit smoking about 6 years ago. Her smoking use included cigarettes. She has a 48.00 pack-year smoking history. She has never used smokeless tobacco. She reports current alcohol use. She reports that she does not use drugs.  Current Outpatient Medications on File Prior to Visit  Medication Sig Dispense Refill  . acetaminophen (TYLENOL) 325 MG tablet Take 650 mg by mouth every 6 (six) hours as needed for mild pain.     . budesonide-formoterol (SYMBICORT) 80-4.5 MCG/ACT inhaler Inhale 2 puffs into the lungs in the morning and at bedtime. 30.6 g 1  . docusate sodium (COLACE) 100 MG capsule Take 100 mg by mouth 2 (two) times daily.    Marland Kitchen esomeprazole (NEXIUM) 40 MG capsule Take 1 capsule (40 mg total) by mouth daily before breakfast. 90 capsule 3   No current facility-administered medications on file prior to visit.     Objective:  Objective  Physical Exam Constitutional:      General: She is not in acute distress.    Appearance: Normal appearance. She is well-developed. She is not ill-appearing.  HENT:     Head: Normocephalic and atraumatic.     Right Ear: External ear normal.     Left Ear: External ear normal.     Nose: Nose normal.  Eyes:     Extraocular  Movements: Extraocular movements intact.     Pupils: Pupils are equal, round, and reactive to light.  Neck:     Thyroid: No thyromegaly.  Cardiovascular:     Rate and Rhythm: Normal rate and regular rhythm.     Heart sounds: Normal heart sounds. No murmur heard.   Pulmonary:     Effort: Pulmonary effort is normal. No respiratory distress.     Breath sounds: Normal breath sounds. No wheezing or rales.  Abdominal:      General: Abdomen is flat. Bowel sounds are normal.     Palpations: Abdomen is soft.     Tenderness: There is no abdominal tenderness.  Musculoskeletal:        General: Normal range of motion.     Cervical back: Normal range of motion and neck supple.  Skin:    General: Skin is warm and dry.  Neurological:     Mental Status: She is alert and oriented to person, place, and time.  Psychiatric:        Behavior: Behavior normal.    BP 96/60   Pulse 91   Temp 97.7 F (36.5 C)   Resp 16   Wt 124 lb 3.2 oz (56.3 kg)   SpO2 95%   BMI 23.47 kg/m  Wt Readings from Last 3 Encounters:  05/13/20 124 lb 3.2 oz (56.3 kg)  04/01/20 122 lb (55.3 kg)  03/04/20 117 lb (53.1 kg)     Lab Results  Component Value Date   WBC 7.3 05/13/2020   HGB 12.7 05/13/2020   HCT 38.6 05/13/2020   PLT 251.0 05/13/2020   GLUCOSE 89 05/13/2020   CHOL 121 09/28/2019   TRIG 70 09/28/2019   HDL 47 09/28/2019   LDLDIRECT 155.9 12/14/2012   LDLCALC 60 09/28/2019   ALT 25 05/13/2020   AST 19 05/13/2020   NA 140 05/13/2020   K 4.5 05/13/2020   CL 105 05/13/2020   CREATININE 0.80 05/13/2020   BUN 19 05/13/2020   CO2 30 05/13/2020   TSH 1.86 05/13/2020   INR 1.0 09/27/2019   HGBA1C 5.9 (H) 09/28/2019    MR BRAIN W WO CONTRAST  Result Date: 02/15/2020  [facility] [address] [place], [place] [address] [phone] NEUROIMAGING REPORT STUDY DATE: 02/13/2020 PATIENT NAME: Aubri Gathright Krone DOB: 04-10-1950 MRN: [id number] EXAM: MRI Brain with and without contrast ORDERING CLINICIAN: Richard A. Sater, MD. PhD CLINICAL HISTORY: 70 year old woman with history of dystonia and cerebritis COMPARISON FILMS: MRI 10/29/2019 TECHNIQUE:MRI of the brain with and without contrast was obtained utilizing 5 mm axial slices with T1, T2, T2 flair, SWI and diffusion weighted views.  T1 sagittal, T2 coronal and postcontrast views in the axial and coronal plane were obtained. CONTRAST: 10 ml Multihance  IMAGING SITE: [facility], [place]. FINDINGS: On sagittal images, the spinal cord is imaged caudally to C3 and is normal in caliber.   The contents of the posterior fossa are of normal size and position.   The pituitary gland and optic chiasm appear normal.    Brain volume appears normal.   The ventricles are normal in size and without distortion.  There are no abnormal extra-axial collections of fluid.  The cerebellum and brainstem appears normal.   The deep gray matter appears normal.  The cerebral hemispheres have normal signal for age with just a couple punctate T2/FLAIR hyperintense foci.  The sulcal pattern near the vertex is asymmetric with larger sulci on the left than  the right.  However, unlike the October 29, 2019 MRI there is no abnormal signal within the sulci..   Diffusion weighted images are normal.  Susceptibility weighted images are normal.   The orbits appear normal.   The VIIth/VIIIth nerve complex appears normal.  The mastoid air cells appear normal.  The paranasal sinuses appear normal.  Flow voids are identified within the major intracerebral arteries.  After the infusion of contrast material, a normal enhancement pattern is noted.  The abnormal pachymeningeal enhancement noted on the 10/29/2019 MRI has resolved.   This MRI of the brain with and without contrast shows the following: 1.   There is resolution of the abnormal signal within the right frontal and left parieto-occipital lobes within the sulci and the associated abnormal pachymeningeal enhancement.  However, there continues to be some asymmetry of the sulci, being more prominent on the left than the right.  This could be incidental or a sequela of the previous abnormalities. 2.    No acute findings.  Normal enhancement pattern. INTERPRETING PHYSICIAN: Richard A. Felecia Shelling, MD, PhD, FAAN Certified in  Neuroimaging by [employer]     Assessment & Plan:  Plan    No orders of the defined types  were placed in this encounter.   Problem List Items Addressed This Visit    Osteopenia   Relevant Orders   TSH (Completed)   Vitamin D deficiency    Supplement and monitor      Relevant Orders   Comprehensive metabolic panel (Completed)   TSH (Completed)   VITAMIN D 25 Hydroxy (Vit-D Deficiency, Fractures) (Completed)   Hypokalemia   Relevant Orders   TSH (Completed)   Cerebritis    She is following with [facility], Dr Felecia Shelling and clinically and on imaging much improved. They have taken her off the [place] and she has done well      Anemia - Primary    Increase leafy greens, consider increased lean red meat and using cast iron cookware. Continue to monitor, report any concerns      Relevant Orders   CBC (Completed)   Iron, TIBC and Ferritin Panel (Completed)   TSH (Completed)   Fecal occult blood, imunochemical      Follow-up: Return in about 6 months (around 11/13/2020), or 6 mn cpe with MD and AWV with nursing any time this year, for annual exam.   I,Alexis Bryant,acting as a scribe for Penni Homans, MD.,have documented all relevant documentation on the behalf of Penni Homans, MD,as directed by  Penni Homans, MD while in the presence of Penni Homans, MD.  Medical screening examination/treatment was performed by qualified clinical staff member and as supervising physician I was immediately available for consultation/collaboration. I have reviewed documentation and agree with assessment and plan.  Penni Homans, MD

## 2020-05-14 ENCOUNTER — Other Ambulatory Visit: Payer: Self-pay

## 2020-05-14 DIAGNOSIS — D649 Anemia, unspecified: Secondary | ICD-10-CM | POA: Insufficient documentation

## 2020-05-14 DIAGNOSIS — R748 Abnormal levels of other serum enzymes: Secondary | ICD-10-CM

## 2020-05-14 LAB — IRON,TIBC AND FERRITIN PANEL
%SAT: 25 % (calc) (ref 16–45)
Ferritin: 24 ng/mL (ref 16–288)
Iron: 81 ug/dL (ref 45–160)
TIBC: 328 mcg/dL (calc) (ref 250–450)

## 2020-05-14 NOTE — Assessment & Plan Note (Signed)
She is following with [facility], Dr Felecia Shelling and clinically and on imaging much improved. They have taken her off the [place] and she has done well

## 2020-05-14 NOTE — Assessment & Plan Note (Signed)
Increase leafy greens, consider increased lean red meat and using cast iron cookware. Continue to monitor, report any concerns 

## 2020-05-14 NOTE — Assessment & Plan Note (Signed)
Supplement and monitor 

## 2020-05-16 ENCOUNTER — Other Ambulatory Visit: Payer: Self-pay

## 2020-05-16 ENCOUNTER — Other Ambulatory Visit (INDEPENDENT_AMBULATORY_CARE_PROVIDER_SITE_OTHER): Payer: Medicare Other

## 2020-05-16 DIAGNOSIS — R748 Abnormal levels of other serum enzymes: Secondary | ICD-10-CM

## 2020-05-16 LAB — COMPREHENSIVE METABOLIC PANEL
ALT: 26 U/L (ref 0–35)
AST: 23 U/L (ref 0–37)
Albumin: 4.1 g/dL (ref 3.5–5.2)
Alkaline Phosphatase: 146 U/L — ABNORMAL HIGH (ref 39–117)
BUN: 20 mg/dL (ref 6–23)
CO2: 28 mEq/L (ref 19–32)
Calcium: 9.9 mg/dL (ref 8.4–10.5)
Chloride: 104 mEq/L (ref 96–112)
Creatinine, Ser: 0.89 mg/dL (ref 0.40–1.20)
GFR: 65.95 mL/min (ref >60.00)
Glucose, Bld: 88 mg/dL (ref 70–99)
Potassium: 4.6 mEq/L (ref 3.5–5.1)
Sodium: 140 mEq/L (ref 135–145)
Total Bilirubin: 0.5 mg/dL (ref 0.2–1.2)
Total Protein: 6.8 g/dL (ref 6.0–8.3)

## 2020-05-16 NOTE — Addendum Note (Signed)
Addended by: Kelle Darting A on: 05/16/2020 09:52 AM   Modules accepted: Orders

## 2020-05-17 LAB — LACTATE DEHYDROGENASE: LDH: 141 U/L (ref 120–250)

## 2020-05-20 ENCOUNTER — Telehealth: Payer: Self-pay

## 2020-05-20 ENCOUNTER — Other Ambulatory Visit: Payer: Self-pay | Admitting: *Deleted

## 2020-05-20 ENCOUNTER — Other Ambulatory Visit (INDEPENDENT_AMBULATORY_CARE_PROVIDER_SITE_OTHER): Payer: Medicare Other

## 2020-05-20 DIAGNOSIS — D649 Anemia, unspecified: Secondary | ICD-10-CM

## 2020-05-20 DIAGNOSIS — R748 Abnormal levels of other serum enzymes: Secondary | ICD-10-CM

## 2020-05-20 LAB — FECAL OCCULT BLOOD, IMMUNOCHEMICAL: Fecal Occult Bld: POSITIVE — AB

## 2020-05-20 NOTE — Telephone Encounter (Signed)
See result note.  

## 2020-05-20 NOTE — Telephone Encounter (Signed)
Positive Ifob  Caller: Margarita Grizzle Receiver: Tawanda Schall Date and time: 05/20/2020 at 10:16 am

## 2020-06-16 ENCOUNTER — Other Ambulatory Visit: Payer: Self-pay

## 2020-06-16 ENCOUNTER — Other Ambulatory Visit (INDEPENDENT_AMBULATORY_CARE_PROVIDER_SITE_OTHER): Payer: Medicare Other

## 2020-06-16 DIAGNOSIS — R748 Abnormal levels of other serum enzymes: Secondary | ICD-10-CM

## 2020-06-16 LAB — COMPREHENSIVE METABOLIC PANEL
ALT: 22 U/L (ref 0–35)
AST: 22 U/L (ref 0–37)
Albumin: 4 g/dL (ref 3.5–5.2)
Alkaline Phosphatase: 103 U/L (ref 39–117)
BUN: 17 mg/dL (ref 6–23)
CO2: 26 mEq/L (ref 19–32)
Calcium: 9.7 mg/dL (ref 8.4–10.5)
Chloride: 107 mEq/L (ref 96–112)
Creatinine, Ser: 1.01 mg/dL (ref 0.40–1.20)
GFR: 56.63 mL/min — ABNORMAL LOW (ref >60.00)
Glucose, Bld: 83 mg/dL (ref 70–99)
Potassium: 4.1 mEq/L (ref 3.5–5.1)
Sodium: 142 mEq/L (ref 135–145)
Total Bilirubin: 1 mg/dL (ref 0.2–1.2)
Total Protein: 6.7 g/dL (ref 6.0–8.3)

## 2020-06-23 ENCOUNTER — Other Ambulatory Visit: Payer: Self-pay

## 2020-06-23 ENCOUNTER — Telehealth: Payer: Self-pay | Admitting: Family Medicine

## 2020-06-23 MED ORDER — BUDESONIDE-FORMOTEROL FUMARATE 80-4.5 MCG/ACT IN AERO: 2.0000 | INHALATION_SPRAY | Freq: Two times a day (BID) | RESPIRATORY_TRACT | 1 refills | Status: DC

## 2020-06-23 NOTE — Telephone Encounter (Signed)
New pharmacy Medication: budesonide-formoterol (SYMBICORT) 80-4.5 MCG/ACT inhaler [[id number]]       Has the patient contacted their pharmacy? No - changing pharmacy (If no, request that the patient contact the pharmacy for the refill.) (If yes, when and what did the pharmacy advise?)     Preferred Pharmacy (with phone number or street name):  [facility], [place] Phone:  [phone]  Fax:  [phone]          Agent: Please be advised that RX refills may take up to 3 business days. We ask that you follow-up with your pharmacy.

## 2020-06-23 NOTE — Telephone Encounter (Signed)
[facility]

## 2020-07-07 ENCOUNTER — Telehealth: Payer: Self-pay | Admitting: Family Medicine

## 2020-07-07 NOTE — Telephone Encounter (Signed)
Medication: budesonide-formoterol (SYMBICORT) 80-4.5 MCG/ACT inhaler [[id number]]     Has the patient contacted their pharmacy? no (If no, request that the patient contact the pharmacy for the refill.) (If yes, when and what did the pharmacy advise?)    Preferred Pharmacy (with phone number or street name):  [id number] - [place], [place] - [address] AT [place] Phone:  [phone]  Fax:  [phone]         Agent: Please be advised that RX refills may take up to 3 business days. We ask that you follow-up with your pharmacy.

## 2020-07-08 ENCOUNTER — Other Ambulatory Visit: Payer: Self-pay

## 2020-07-08 MED ORDER — BUDESONIDE-FORMOTEROL FUMARATE 160-4.5 MCG/ACT IN AERO: 2.0000 | INHALATION_SPRAY | Freq: Two times a day (BID) | RESPIRATORY_TRACT | 2 refills | Status: DC

## 2020-07-08 NOTE — Telephone Encounter (Signed)
Medication sent.

## 2020-07-09 NOTE — Progress Notes (Addendum)
Subjective:   Danielle Fox is a 70 y.o. female who presents for Medicare Annual (Subsequent) preventive examination.  Review of Systems     Cardiac Risk Factors include: advanced age (>75men, >40 women);dyslipidemia     Objective:    Today's Vitals   07/10/20 1054  BP: 104/72  Pulse: 78  Resp: 16  Temp: 97.6 F (36.4 C)  TempSrc: Temporal  SpO2: 96%  Weight: 121 lb 12.8 oz (55.2 kg)  Height: 5\' 1"  (1.549 m)   Body mass index is 23.01 kg/m.  Advanced Directives 07/10/2020 10/08/2019 10/08/2019 10/02/2019 09/27/2019 09/17/2019 03/08/2019  Does Patient Have a Medical Advance Directive? Yes Yes Yes Yes No No Yes  Type of Paramedic of [place];Living will [place];Living will [place];Living will [place];Living will;Out of facility DNR (pink MOST or yellow form) - - Press photographer;Living will  Does patient want to make changes to medical advance directive? - No - Patient declined No - Patient declined - - - No - Patient declined  Copy of Healthcare Power of Attorney in Chart? - No - copy requested No - copy requested - - - No - copy requested  Would patient like information on creating a medical advance directive? - No - Patient declined No - Patient declined - No - Guardian declined No - Patient declined -    Current Medications (verified) Outpatient Encounter Medications as of 07/10/2020  Medication Sig  . acetaminophen (TYLENOL) 325 MG tablet Take 650 mg by mouth every 6 (six) hours as needed for mild pain.   . budesonide-formoterol (SYMBICORT) 160-4.5 MCG/ACT inhaler Inhale 2 puffs into the lungs in the morning and at bedtime.  . docusate sodium (COLACE) 100 MG capsule Take 100 mg by mouth 2 (two) times daily.  Marland Kitchen esomeprazole (NEXIUM) 40 MG capsule Take 1 capsule (40 mg total) by mouth daily before breakfast.   No facility-administered encounter medications on file as of 07/10/2020.     Allergies (verified) Doxycycline, Vancomycin, and Erythromycin base   History: Past Medical History:  Diagnosis Date  . Allergy   . Clostridioides difficile infection 10/19/2018   tested 10/2018 negative  . Colon polyps   . Constipation 10/06/2014  . COPD (chronic obstructive pulmonary disease) ([place])   . Osteopenia   . 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 Preventative health care 06/29/2016  . Pruritus 06/29/2016  . Pyloric stenosis in adult 05/2019  . Welcome to Medicare preventive visit 06/29/2016   Past Surgical History:  Procedure Laterality Date  . ABDOMINAL HYSTERECTOMY    . APPENDECTOMY    . COLONOSCOPY     01-14-2005  . SINUS IRRIGATION  04/02/2015  . UPPER GASTROINTESTINAL ENDOSCOPY  11/22/2018   Family History  Problem Relation Age of Onset  . Heart disease Mother        cabg, MI  . Alcohol abuse Father   . Cirrhosis Father   . Heart disease Father        MI  . Colon cancer Neg Hx   . Colon polyps Neg Hx   . Esophageal cancer Neg Hx   . Stomach cancer Neg Hx   . Rectal cancer Neg Hx    Social History   Socioeconomic History  . Marital status: Significant Other    Spouse name: Not on file  . Number of children: 3  . Years of education: Not on file  . Highest  education level: Not on file  Occupational History  . 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 Smoking status: Former Smoker    Packs/day: 1.00    Years: 48.00    Pack years: 48.00    Types: Cigarettes    Quit date: 02/19/2014    Years since quitting: 6.3  . Smokeless tobacco: Never Used  . Tobacco comment: Quit and has no desire to smoke again,  Vaping Use  . Vaping Use: Never used  Substance and Sexual Activity  . Alcohol use: Yes    Alcohol/week: 0.0 standard drinks    Comment: socially  . Drug use: No  . Sexual activity: Yes    Partners: Male  Other Topics Concern  . Not on file  Social History Narrative   Exercise-- walks dog on occasion    Right Handed   One Story Home   Does not drink caffeine    Social Determinants of Health   Financial Resource Strain: Low Risk   . Difficulty of Paying Living Expenses: Not hard at all  Food Insecurity: No Food Insecurity  . Worried About Charity fundraiser in the Last Year: Never true  . Ran Out of Food in the Last Year: Never true  Transportation Needs: No Transportation Needs  . Lack of Transportation (Medical): No  . Lack of Transportation (Non-Medical): No  Physical Activity: Inactive  . Days of Exercise per Week: 0 days  . Minutes of Exercise per Session: 0 min  Stress: No Stress Concern Present  . Feeling of Stress : Not at all  Social Connections: Not on file    Tobacco Counseling Counseling given: Not Answered Comment: Quit and has no desire to smoke again,   Clinical Intake:  Pre-visit preparation completed: Yes  Pain : No/denies pain     Nutritional Status: BMI of 19-24  Normal Nutritional Risks: None Diabetes: No  How often do you need to have someone help you when you read instructions, pamphlets, or other written materials from your doctor or pharmacy?: 1 - Never  Diabetic?No  Interpreter Needed?: No  Information entered by :: Caroleen Hamman LPN   Activities of Daily Living In your present state of health, do you have any difficulty performing the following activities: 07/10/2020 10/08/2019  Hearing? N N  Vision? N N  Difficulty concentrating or making decisions? N N  Walking or climbing stairs? N N  Dressing or bathing? N N  Doing errands, shopping? N N  Preparing Food and eating ? N -  Using the Toilet? N -  In the past six months, have you accidently leaked urine? N -  Do you have problems with loss of bowel control? N -  Managing your Medications? N -  Managing your Finances? N -  Housekeeping or managing your Housekeeping? N -  Some recent data might be hidden    Patient Care Team: Mosie Lukes, MD as PCP - General (Family  Medicine)  Indicate any recent Medical Services you may have received from other than [facility] providers in the past year (date may be approximate).     Assessment:   This is a routine wellness examination for Danielle Fox.  Hearing/Vision screen  Hearing Screening   125Hz  250Hz  500Hz  1000Hz  2000Hz  3000Hz  4000Hz  6000Hz  8000Hz   Right ear:           Left ear:           Comments: Mild hearing loss  Vision  Screening Comments: Reading glasses Last eye exam 04/2020-Dr. Posey Pronto  Dietary issues and exercise activities discussed: Current Exercise Habits: The patient does not participate in regular exercise at present, Exercise limited by: None identified  Goals Addressed            This Visit's Progress   . Patient Stated   On track    Maintain healthy active lifestyle.       Depression Screen PHQ 2/9 Scores 07/10/2020 05/13/2020 03/08/2019 12/28/2016 06/29/2016 12/23/2015 12/14/2012  PHQ - 2 Score 0 0 0 0 0 0 0    Fall Risk Fall Risk  07/10/2020 10/02/2019 03/08/2019 12/28/2016 06/29/2016  Falls in the past year? 0 0 0 No No  Number falls in past yr: 0 0 - - -  Injury with Fall? 0 0 - - -  Follow up Falls prevention discussed - Education provided;Falls prevention discussed - -    FALL RISK PREVENTION PERTAINING TO THE HOME:  Any stairs in or around the home? No  Home free of loose throw rugs in walkways, pet beds, electrical cords, etc? No  Adequate lighting in your home to reduce risk of falls? Yes   ASSISTIVE DEVICES UTILIZED TO PREVENT FALLS:  Life alert? No  Use of a cane, walker or w/c? No  Grab bars in the bathroom? No  Shower chair or bench in shower? No  Elevated toilet seat or a handicapped toilet? No   TIMED UP AND GO:  Was the test performed? Yes .  Length of time to ambulate 10 feet: 10 sec.   Gait steady and fast without use of assistive device  Cognitive Function:Normal cognitive status assessed by direct observation by this Nurse Health Advisor. No abnormalities  found.          Immunizations Immunization History  Administered Date(s) Administered  . Fluad Quad(high Dose 65+) 01/29/2019, 12/28/2019  . Influenza Split 01/07/2011  . Influenza Whole 11/29/2009  . Influenza, High Dose Seasonal PF 11/07/2017  . Influenza,inj,Quad PF,6+ Mos 12/14/2012  . Influenza-Unspecified 12/28/2013, 12/29/2014, 12/01/2015, 12/25/2016  . PFIZER(Purple Top)SARS-COV-2 Vaccination 05/06/2019, 06/06/2019, 12/15/2019  . Pneumococcal Conjugate-13 06/29/2016  . Tdap 03/01/2001, 06/29/2016  . Zoster Recombinat (Shingrix) 03/07/2018    TDAP status: Up to date  Flu Vaccine status: Up to date  Pneumococcal vaccine status: Declined,  Education has been provided regarding the importance of this vaccine but patient still declined. Advised may receive this vaccine at local pharmacy or Health Dept. Aware to provide a copy of the vaccination record if obtained from local pharmacy or Health Dept. Verbalized acceptance and understanding.   Covid-19 vaccine status: Completed vaccines  Qualifies for Shingles Vaccine? No   Zostavax completed No   Shingrix Completed?: Yes  Screening Tests Health Maintenance  Topic Date Due  . COVID-19 Vaccine (4 - Booster for Pfizer series) 03/16/2020  . MAMMOGRAM  12/27/2020 (Originally 01/05/2018)  . Hepatitis C Screening  05/13/2021 (Originally 07/06/1968)  . PNA vac Low Risk Adult (2 of 2 - PPSV23) 05/13/2021 (Originally 06/29/2017)  . INFLUENZA VACCINE  09/29/2020  . TETANUS/TDAP  06/30/2026  . COLONOSCOPY (Pts 45-61yrs Insurance coverage will need to be confirmed)  11/21/2028  . DEXA SCAN  Completed  . HPV VACCINES  Aged Out    Health Maintenance  Health Maintenance Due  Topic Date Due  . COVID-19 Vaccine (4 - Booster for Pfizer series) 03/16/2020    Colorectal cancer screening: Type of screening: Colonoscopy. Completed 11/22/2018. Repeat every 10 years  Mammogram status: Due-Declined  Bone Density status:Due-Declined  Lung  Cancer Screening: (Low Dose CT Chest recommended if Age 67-80 years, 30 pack-year currently smoking OR have quit w/in 15years.) does not qualify. Chest CT done 08/2019    Additional Screening:  Hepatitis C Screening: does qualify; Patient declined  Vision Screening: Recommended annual ophthalmology exams for early detection of glaucoma and other disorders of the eye. Is the patient up to date with their annual eye exam?  Yes  Who is the provider or what is the name of the office in which the patient attends annual eye exams? Dr. Posey Pronto   Dental Screening: Recommended annual dental exams for proper oral hygiene  Community Resource Referral / Chronic Care Management: CRR required this visit?  No   CCM required this visit?  No      Plan:     I have personally reviewed and noted the following in the patient's chart:   . Medical and social history . Use of alcohol, tobacco or illicit drugs  . Current medications and supplements including opioid prescriptions.  . Functional ability and status . Nutritional status . Physical activity . Advanced directives . List of other physicians . Hospitalizations, surgeries, and ER visits in previous 12 months . Vitals . Screenings to include cognitive, depression, and falls . Referrals and appointments  In addition, I have reviewed and discussed with patient certain preventive protocols, quality metrics, and best practice recommendations. A written personalized care plan for preventive services as well as general preventive health recommendations were provided to patient.   Patient would liek to access avs on mychart  Marta Antu, LPN   [id number]  Nurse Health Advisor  Nurse Notes: None   Medical screening examination/treatment/procedure(s) were performed by non-physician practitioner and as supervising provider I was immediately available for consultation/collaboration.  I agree with above. Marrian Salvage, FNP

## 2020-07-10 ENCOUNTER — Other Ambulatory Visit: Payer: Self-pay

## 2020-07-10 ENCOUNTER — Ambulatory Visit (INDEPENDENT_AMBULATORY_CARE_PROVIDER_SITE_OTHER): Payer: Medicare Other

## 2020-07-10 VITALS — BP 104/72 | HR 78 | Temp 97.6°F | Resp 16 | Ht 61.0 in | Wt 121.8 lb

## 2020-07-10 DIAGNOSIS — Z0000 Encounter for general adult medical examination without abnormal findings: Secondary | ICD-10-CM

## 2020-07-10 NOTE — Patient Instructions (Signed)
Danielle Fox , Thank you for taking time to come for your Medicare Wellness Visit. I appreciate your ongoing commitment to your health goals. Please review the following plan we discussed and let me know if I can assist you in the future.   Screening recommendations/referrals: Colonoscopy: Completed 11/29/2018-Follow GI recommendations for repeat. Mammogram: Due-Declined today. Please call the office to schedule if you change your mind. Bone Density: Due-Declined today. Please call the office to schedule if you change your mind. Recommended yearly ophthalmology/optometry visit for glaucoma screening and checkup Recommended yearly dental visit for hygiene and checkup  Vaccinations: Influenza vaccine: Up to date Pneumococcal vaccine: Declined Tdap vaccine: Up to date-Due 06/30/2026 Shingles vaccine: Completed vaccines   Covid-19:Up to date  Advanced directives: Documents completed  Conditions/risks identified: See problem list  Next appointment: Follow up in one year for your annual wellness visit 07/16/2021 @ 11:00   Preventive Care 6 Years and Older, Female Preventive care refers to lifestyle choices and visits with your health care provider that can promote health and wellness. What does preventive care include?  A yearly physical exam. This is also called an annual well check.  Dental exams once or twice a year.  Routine eye exams. Ask your health care provider how often you should have your eyes checked.  Personal lifestyle choices, including:  Daily care of your teeth and gums.  Regular physical activity.  Eating a healthy diet.  Avoiding tobacco and drug use.  Limiting alcohol use.  Practicing safe sex.  Taking low-dose aspirin every day.  Taking vitamin and mineral supplements as recommended by your health care provider. What happens during an annual well check? The services and screenings done by your health care provider during your annual well check will depend  on your age, overall health, lifestyle risk factors, and family history of disease. Counseling  Your health care provider may ask you questions about your:  Alcohol use.  Tobacco use.  Drug use.  Emotional well-being.  Home and relationship well-being.  Sexual activity.  Eating habits.  History of falls.  Memory and ability to understand (cognition).  Work and work Statistician.  Reproductive health. Screening  You may have the following tests or measurements:  Height, weight, and BMI.  Blood pressure.  Lipid and cholesterol levels. These may be checked every 5 years, or more frequently if you are over 16 years old.  Skin check.  Lung cancer screening. You may have this screening every year starting at age 45 if you have a 30-pack-year history of smoking and currently smoke or have quit within the past 15 years.  Fecal occult blood test (FOBT) of the stool. You may have this test every year starting at age 30.  Flexible sigmoidoscopy or colonoscopy. You may have a sigmoidoscopy every 5 years or a colonoscopy every 10 years starting at age 6.  Hepatitis C blood test.  Hepatitis B blood test.  Sexually transmitted disease (STD) testing.  Diabetes screening. This is done by checking your blood sugar (glucose) after you have not eaten for a while (fasting). You may have this done every 1-3 years.  Bone density scan. This is done to screen for osteoporosis. You may have this done starting at age 60.  Mammogram. This may be done every 1-2 years. Talk to your health care provider about how often you should have regular mammograms. Talk with your health care provider about your test results, treatment options, and if necessary, the need for more tests. Vaccines  Your health care provider may recommend certain vaccines, such as:  Influenza vaccine. This is recommended every year.  Tetanus, diphtheria, and acellular pertussis (Tdap, Td) vaccine. You may need a Td  booster every 10 years.  Zoster vaccine. You may need this after age 61.  Pneumococcal 13-valent conjugate (PCV13) vaccine. One dose is recommended after age 65.  Pneumococcal polysaccharide (PPSV23) vaccine. One dose is recommended after age 71. Talk to your health care provider about which screenings and vaccines you need and how often you need them. This information is not intended to replace advice given to you by your health care provider. Make sure you discuss any questions you have with your health care provider. Document Released: 03/14/2015 Document Revised: 11/05/2015 Document Reviewed: 12/17/2014 Elsevier Interactive Patient Education  2017 [place] Prevention in the Home Falls can cause injuries. They can happen to people of all ages. There are many things you can do to make your home safe and to help prevent falls. What can I do on the outside of my home?  Regularly fix the edges of walkways and driveways and fix any cracks.  Remove anything that might make you trip as you walk through a door, such as a raised step or threshold.  Trim any bushes or trees on the path to your home.  Use bright outdoor lighting.  Clear any walking paths of anything that might make someone trip, such as rocks or tools.  Regularly check to see if handrails are loose or broken. Make sure that both sides of any steps have handrails.  Any raised decks and porches should have guardrails on the edges.  Have any leaves, snow, or ice cleared regularly.  Use sand or salt on walking paths during winter.  Clean up any spills in your garage right away. This includes oil or grease spills. What can I do in the bathroom?  Use night lights.  Install grab bars by the toilet and in the tub and shower. Do not use towel bars as grab bars.  Use non-skid mats or decals in the tub or shower.  If you need to sit down in the shower, use a plastic, non-slip stool.  Keep the floor dry. Clean up  any water that spills on the floor as soon as it happens.  Remove soap buildup in the tub or shower regularly.  Attach bath mats securely with double-sided non-slip rug tape.  Do not have throw rugs and other things on the floor that can make you trip. What can I do in the bedroom?  Use night lights.  Make sure that you have a light by your bed that is easy to reach.  Do not use any sheets or blankets that are too big for your bed. They should not hang down onto the floor.  Have a firm chair that has side arms. You can use this for support while you get dressed.  Do not have throw rugs and other things on the floor that can make you trip. What can I do in the kitchen?  Clean up any spills right away.  Avoid walking on wet floors.  Keep items that you use a lot in easy-to-reach places.  If you need to reach something above you, use a strong step stool that has a grab bar.  Keep electrical cords out of the way.  Do not use floor polish or wax that makes floors slippery. If you must use wax, use non-skid floor wax.  Do  not have throw rugs and other things on the floor that can make you trip. What can I do with my stairs?  Do not leave any items on the stairs.  Make sure that there are handrails on both sides of the stairs and use them. Fix handrails that are broken or loose. Make sure that handrails are as long as the stairways.  Check any carpeting to make sure that it is firmly attached to the stairs. Fix any carpet that is loose or worn.  Avoid having throw rugs at the top or bottom of the stairs. If you do have throw rugs, attach them to the floor with carpet tape.  Make sure that you have a light switch at the top of the stairs and the bottom of the stairs. If you do not have them, ask someone to add them for you. What else can I do to help prevent falls?  Wear shoes that:  Do not have high heels.  Have rubber bottoms.  Are comfortable and fit you well.  Are  closed at the toe. Do not wear sandals.  If you use a stepladder:  Make sure that it is fully opened. Do not climb a closed stepladder.  Make sure that both sides of the stepladder are locked into place.  Ask someone to hold it for you, if possible.  Clearly mark and make sure that you can see:  Any grab bars or handrails.  First and last steps.  Where the edge of each step is.  Use tools that help you move around (mobility aids) if they are needed. These include:  Canes.  Walkers.  Scooters.  Crutches.  Turn on the lights when you go into a dark area. Replace any light bulbs as soon as they burn out.  Set up your furniture so you have a clear path. Avoid moving your furniture around.  If any of your floors are uneven, fix them.  If there are any pets around you, be aware of where they are.  Review your medicines with your doctor. Some medicines can make you feel dizzy. This can increase your chance of falling. Ask your doctor what other things that you can do to help prevent falls. This information is not intended to replace advice given to you by your health care provider. Make sure you discuss any questions you have with your health care provider. Document Released: 12/12/2008 Document Revised: 07/24/2015 Document Reviewed: 03/22/2014 Elsevier Interactive Patient Education  2017 [employer].

## 2020-09-03 ENCOUNTER — Other Ambulatory Visit: Payer: Medicare Other

## 2020-09-08 ENCOUNTER — Ambulatory Visit: Payer: Medicare Other | Admitting: Neurology

## 2020-09-17 ENCOUNTER — Ambulatory Visit: Payer: Medicare Other | Admitting: Family Medicine

## 2020-09-22 ENCOUNTER — Other Ambulatory Visit: Payer: Self-pay | Admitting: *Deleted

## 2020-09-22 DIAGNOSIS — Z87891 Personal history of nicotine dependence: Secondary | ICD-10-CM

## 2020-09-29 ENCOUNTER — Other Ambulatory Visit: Payer: Self-pay

## 2020-09-29 ENCOUNTER — Ambulatory Visit
Admission: RE | Admit: 2020-09-29 | Discharge: 2020-09-29 | Disposition: A | Discharge status: Home / Self Care | Payer: Medicare Other | Source: Ambulatory Visit | Attending: Neurology | Admitting: Neurology

## 2020-09-29 DIAGNOSIS — I682 Cerebral arteritis in other diseases classified elsewhere: Secondary | ICD-10-CM | POA: Diagnosis not present

## 2020-09-29 DIAGNOSIS — G0490 Encephalitis and encephalomyelitis, unspecified: Secondary | ICD-10-CM | POA: Diagnosis not present

## 2020-09-29 DIAGNOSIS — I677 Cerebral arteritis, not elsewhere classified: Secondary | ICD-10-CM | POA: Diagnosis not present

## 2020-09-29 MED ORDER — GADOBENATE DIMEGLUMINE 529 MG/ML IV SOLN
11.0000 mL | Freq: Once | INTRAVENOUS | Status: AC | PRN
  Administered 2020-09-29: 11 mL via INTRAVENOUS

## 2020-10-02 ENCOUNTER — Encounter: Payer: Self-pay | Admitting: Neurology

## 2020-10-02 ENCOUNTER — Ambulatory Visit: Payer: Medicare Other | Admitting: Neurology

## 2020-10-02 ENCOUNTER — Other Ambulatory Visit: Payer: Self-pay

## 2020-10-02 VITALS — BP 103/52 | HR 78 | Ht 61.0 in | Wt 123.5 lb

## 2020-10-02 DIAGNOSIS — I682 Cerebral arteritis in other diseases classified elsewhere: Secondary | ICD-10-CM | POA: Diagnosis not present

## 2020-10-02 DIAGNOSIS — K529 Noninfective gastroenteritis and colitis, unspecified: Secondary | ICD-10-CM

## 2020-10-02 DIAGNOSIS — G0490 Encephalitis and encephalomyelitis, unspecified: Secondary | ICD-10-CM | POA: Diagnosis not present

## 2020-10-02 DIAGNOSIS — G249 Dystonia, unspecified: Secondary | ICD-10-CM | POA: Diagnosis not present

## 2020-10-02 NOTE — Progress Notes (Signed)
[facility]  PATIENT: Danielle Fox DOB: 1950-10-31  REFERRING DOCTOR OR PCP: Metta Clines (neurology); Vivien Rossetti (PCP) SOURCE: Patient, notes from hospitalization, notes from Dr. Tomi Likens, extensive lab work and imaging results reviewed.  MRI images personally reviewed.  _________________________________   HISTORICAL  CHIEF COMPLAINT:  Chief Complaint  Patient presents with   Follow-up    Rm 2, alone. Here for 6 month cerebritis f/u, pt reports doing well, no new or worsening in sx.      Update 04/01/2020: She is completely back to her baseline..  She has had no new symptoms.  She is w off Keppra and the  left-sided phasic spasms vs. Seizure have .   She has had no further episodes since September.     I showed her the MRI images and we discussed them in detail.   The cerebritis has completely resolved.    Around the same time as her CNS abnormalities she had acute colitis and had abdominal pain, bloody diarrhea.   MRI 02/13/2020 showed resolution of the abnormal signal within the right frontal and left parieto-occipital lobes within the sulci and the associated abnormal pachymeningeal enhancement.  However, there continues to be some asymmetry of the sulci, being more prominent on the left than the right.  This could be incidental or a sequela of the previous abnormalities.    No acute findings.  Normal enhancement pattern.  MRI brain 09/29/2020 showed no new finding and continued asymmetry of the sulci, being more prominent on the left than the right.  This could be incidental or a sequela of the previous abnormalities.     LAbs:  Quant-TB, PAN_ANCA, ACE were normal/negative   History of neurologic symptoms: She was fine until July 2021.  She began to have difficulty controlling her left arm 1 day that month.    She was in [place] at the time.  She still taking the and the was 1 week according to notes,she had an MRI showing subtle enhancement involving the  right posterior frontal lobe and anterior right parietal lobe, which was thought to be subacute cortical infarcts.  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  Echocardiogram was reportedly unremarkable.  LDL was 128, Hgb A1c 6 and TSH 0.976.  She was discharged on ASA '81mg'$  and atorvastatin '40mg'$  daily.     She returned to [place] a few days later.  MRI of the brain with and without contrast 09/22/2019 showed flair hyperintense signal in the sulci in the superior right frontal convexity and the left cingulate sulcus.  At that time, there is no definite abnormal enhancement.  On 09/27/2019, she woke up and noted left-sided weakness and facial droop.  Symptoms were fluctuating as the day went on and she presented to [facility] where she was admitted for further evaluation.    CT head showed no acute intracranial abnormality.  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  There is also more subtle FLAIR changes in the left occipital lobe sulci.  She was admitted to the hospital.  Lumbar puncture was performed showing elevated cell count of 43, mildly elevated protein 50,  Glucose 42, mildly elevated IgG index 0.9, negative culture, negative cryptococcal  antigen, negative Acid Fast culture, cytology with benign reactive changes of increased lymphocytes and monocytes.    She had 3 oligoclonal bands.  Serum labs include negative ANA, elevated sed rate 55, non-reactive HIV, low vitamin D 18.27, negative Ethanol, elevated sed rate 55, negative SARS coronavirus 2 PCR, negative Lyme, ACE 23, negative SSA/SSB antibodies, Hgb A1c 5.9, LDL 60.  Pending serum labs include RF, ANCA,  EBV PCR, RMSF antibodies.  UA and urine drug screen was  negative.  EEG showed intermittent right hemispheric slowing.   As etiology was unknown and symptoms spontaneous improved she was not treated with steroids.  Infectious disease etiology was felt unlikely.  Since she was experiencing some dystonia (versus seizure) she was initially placed on gabapentin and then switched to Keppra 500 mg p.o. twice daily.  She continues on that.    Follow-up MRI 10/29/2019 showed some progression of the FLAIR sulcal abnormality, though she did not have any symptoms. She received IV Solumedrol in August x 5 days, she thinks she improved further. She underwent a second lumbar puncture 11/08/2019.  The [facility] autoimmune myelopathy panel was negative.  HSV 1/2 were negative  Voltage gated potassium channel.    Cytology was negative.  She improved further and by September was back to baseline.  She had acute colitis in July/August 2021 felt to be due to E. Coli.  She was found to have an ovarian cyst.   The CA125 was negative.    Abdominal/pelvic CT angiogram 10/08/2019  Findings are consistent with acute colitis. No evidence of associated colonic perforation or focal abscess.    REVIEW OF SYSTEMS: Constitutional: No fevers, chills, sweats, or change in appetite Eyes: No visual changes, double vision, eye pain Ear, nose and throat: No hearing loss, ear pain, nasal congestion, sore throat Cardiovascular: No chest pain, palpitations Respiratory:  No shortness of breath at rest or with exertion.   No wheezes GastrointestinaI: No nausea, vomiting, diarrhea, abdominal pain, fecal incontinence Genitourinary:  No dysuria, urinary retention or frequency.  No nocturia. Musculoskeletal:  No neck pain, back pain Integumentary: No rash, pruritus, skin lesions Neurological: as above Psychiatric: No depression at this time.  No anxiety Endocrine: No palpitations, diaphoresis, change in appetite, change in weigh or increased thirst Hematologic/Lymphatic:  No anemia, purpura,  petechiae. Allergic/Immunologic: No itchy/runny eyes, nasal congestion, recent allergic reactions, rashes  ALLERGIES: Allergies  Allergen Reactions   Doxycycline Nausea And Vomiting   Vancomycin     Nausea and abdominal pain   Erythromycin Base Nausea Only    HOME MEDICATIONS:  Current Outpatient Medications:    acetaminophen (TYLENOL) 325 MG tablet, Take 650 mg by mouth every 6 (six) hours as needed for mild pain. , Disp: , Rfl:    budesonide-formoterol (SYMBICORT) 160-4.5 MCG/ACT inhaler, Inhale 2 puffs into the lungs in the morning and at bedtime., Disp: 1 each, Rfl: 2   docusate sodium (COLACE) 100 MG capsule, Take 100 mg by mouth 2 (two) times daily., Disp: , Rfl:    esomeprazole (NEXIUM) 40 MG capsule, Take 1 capsule (40 mg total) by mouth daily before breakfast., Disp: 90 capsule, Rfl: 3  PAST MEDICAL HISTORY: 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   Preventative health  care 06/29/2016   Pruritus 06/29/2016   Pyloric stenosis in adult 05/2019   Welcome to Medicare preventive visit 06/29/2016    PAST SURGICAL HISTORY: Past Surgical History:  Procedure Laterality Date   ABDOMINAL HYSTERECTOMY     APPENDECTOMY     COLONOSCOPY     01-14-2005   SINUS IRRIGATION  04/02/2015   UPPER GASTROINTESTINAL ENDOSCOPY  11/22/2018    FAMILY HISTORY: Family History  Problem Relation Age of Onset   Heart disease Mother        cabg, MI   Alcohol abuse Father    Cirrhosis Father    Heart disease Father        MI   Colon cancer Neg Hx    Colon polyps Neg Hx    Esophageal cancer Neg Hx    Stomach cancer Neg Hx    Rectal cancer Neg Hx     SOCIAL HISTORY:  Social History   Socioeconomic History   Marital status: Married    Spouse name: Not on  file   Number of children: 3   Years of education: Not on file   Highest education level: Not on file  Occupational History   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[employer], retired    Fish farm manager: [employer]  Tobacco Use   Smoking status: Former    Packs/day: 1.00    Years: 48.00    Pack years: 48.00    Types: Cigarettes    Quit date: 02/19/2014    Years since quitting: 6.6   Smokeless tobacco: Never   Tobacco comments:    Quit and has no desire to smoke again,  Vaping Use   Vaping Use: Never used  Substance and Sexual Activity   Alcohol use: Yes    Alcohol/week: 0.0 standard drinks    Comment: socially   Drug use: No   Sexual activity: Yes    Partners: Male  Other Topics Concern   Not on file  Social History Narrative   Exercise-- walks dog on occasion   Right Handed   One Story Home   Does not drink caffeine    Social Determinants of Health   Financial Resource Strain: Low Risk    Difficulty of Paying Living Expenses: Not hard at all  Food Insecurity: No Food Insecurity   Worried About Charity fundraiser in the Last Year: Never true   [place] in the Last Year: Never true  Transportation Needs: No Transportation Needs   Lack of Transportation (Medical): No   Lack of Transportation (Non-Medical): No  Physical Activity: Inactive   Days of Exercise per Week: 0 days   Minutes of Exercise per Session: 0 min  Stress: No Stress Concern Present   Feeling of Stress : Not at all  Social Connections: Not on file  Intimate Partner Violence: Not At Risk   Fear of Current or Ex-Partner: No   Emotionally Abused: No   Physically Abused: No   Sexually Abused: No     PHYSICAL EXAM  Vitals:   10/02/20 1141  BP: (!) 103/52  Pulse: 78  Weight: 123 lb 8 oz (56 kg)  Height: '5\' 1"'$  (1.549 m)    Body mass index is 23.34 kg/m.   General: The patient is well-developed and well-nourished and in no acute distress  HEENT:  Head is [place]/AT.  Sclera are anicteric.    Skin: Extremities  are without rash or  edema.  Musculoskeletal:  Back is nontender  Neurologic Exam  Mental status: The patient  is alert and oriented x 3 at the time of the examination. The patient has apparent normal recent and remote memory, with an apparently normal attention span and concentration ability.   Speech is normal.  Cranial nerves: Extraocular movements are full.  Facial strength and sensation was normal.  Hearing was symmetric and normal. Motor:  Muscle bulk is normal.   Tone is normal. Strength is  5 / 5 in all 4 extremities.   Sensory: Sensory testing is intact to pinprick, soft touch and vibration sensation in all 4 extremities.  Coordination: Cerebellar testing reveals good finger-nose-finger and heel-to-shin bilaterally.  Gait and station: Station is normal.   Gait is normal. Tandem gait is minimally wide. Romberg is negative.   Reflexes: Deep tendon reflexes are symmetric and normal bilaterally.       DIAGNOSTIC DATA (LABS, IMAGING, TESTING) - I reviewed patient records, labs, notes, testing and imaging myself where available.  Lab Results  Component Value Date   WBC 7.3 05/13/2020   HGB 12.7 05/13/2020   HCT 38.6 05/13/2020   MCV 83.6 05/13/2020   PLT 251.0 05/13/2020      Component Value Date/Time   NA 142 06/16/2020 0943   K 4.1 06/16/2020 0943   CL 107 06/16/2020 0943   CO2 26 06/16/2020 0943   GLUCOSE 83 06/16/2020 0943   BUN 17 06/16/2020 0943   CREATININE 1.01 06/16/2020 0943   CREATININE 0.74 11/15/2019 1522   CALCIUM 9.7 06/16/2020 0943   PROT 6.7 06/16/2020 0943   ALBUMIN 4.0 06/16/2020 0943   ALBUMIN 3.1 (L) 09/28/2019 1706   AST 22 06/16/2020 0943   ALT 22 06/16/2020 0943   ALKPHOS 103 06/16/2020 0943   BILITOT 1.0 06/16/2020 0943   GFRNONAA >60 10/12/2019 0508   GFRAA >60 10/12/2019 0508   Lab Results  Component Value Date   CHOL 121 09/28/2019   HDL 47 09/28/2019   LDLCALC 60 09/28/2019   LDLDIRECT 155.9 12/14/2012   TRIG 70 09/28/2019    CHOLHDL 2.6 09/28/2019   Lab Results  Component Value Date   HGBA1C 5.9 (H) 09/28/2019   Lab Results  Component Value Date   VITAMINB12 341 12/12/2019   Lab Results  Component Value Date   TSH 1.86 05/13/2020       ASSESSMENT AND PLAN  Cerebritis  Colitis  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   She is back to baseline and the current MRI did not show any evidence of cerebritis.  She asked about extended travel.  There is no contraindication for her to travel.   2.    The etiology is still uncertain- vasculitis?? Perhaps associated with inflammatory bowel disease. 3.     F/u as needed-   She should call sooner if new or worsening neurologic symptoms.   Cleopha Indelicato A. Felecia Shelling, MD, [facility] [id number], [id number] PM Certified in Neurology, Clinical Neurophysiology, Sleep Medicine and Neuroimaging  [facility] [address] [place], [place] [address] [phone]

## 2020-12-01 ENCOUNTER — Other Ambulatory Visit: Payer: Self-pay

## 2020-12-02 ENCOUNTER — Encounter: Payer: Self-pay | Admitting: Family Medicine

## 2020-12-02 ENCOUNTER — Ambulatory Visit (INDEPENDENT_AMBULATORY_CARE_PROVIDER_SITE_OTHER): Payer: Medicare Other | Admitting: Family Medicine

## 2020-12-02 VITALS — BP 100/64 | HR 85 | Temp 97.8°F | Resp 16 | Ht 61.0 in | Wt 123.8 lb

## 2020-12-02 DIAGNOSIS — M8580 Other specified disorders of bone density and structure, unspecified site: Secondary | ICD-10-CM

## 2020-12-02 DIAGNOSIS — D649 Anemia, unspecified: Secondary | ICD-10-CM

## 2020-12-02 DIAGNOSIS — E559 Vitamin D deficiency, unspecified: Secondary | ICD-10-CM | POA: Diagnosis not present

## 2020-12-02 DIAGNOSIS — Z23 Encounter for immunization: Secondary | ICD-10-CM

## 2020-12-02 DIAGNOSIS — Z780 Asymptomatic menopausal state: Secondary | ICD-10-CM

## 2020-12-02 DIAGNOSIS — E785 Hyperlipidemia, unspecified: Secondary | ICD-10-CM | POA: Diagnosis not present

## 2020-12-02 DIAGNOSIS — R195 Other fecal abnormalities: Secondary | ICD-10-CM

## 2020-12-02 DIAGNOSIS — E2839 Other primary ovarian failure: Secondary | ICD-10-CM

## 2020-12-02 DIAGNOSIS — N9489 Other specified conditions associated with female genital organs and menstrual cycle: Secondary | ICD-10-CM

## 2020-12-02 DIAGNOSIS — R053 Chronic cough: Secondary | ICD-10-CM

## 2020-12-02 DIAGNOSIS — Z0000 Encounter for general adult medical examination without abnormal findings: Secondary | ICD-10-CM | POA: Diagnosis not present

## 2020-12-02 LAB — LIPID PANEL
Cholesterol: 245 mg/dL — ABNORMAL HIGH (ref 0–200)
HDL: 69.1 mg/dL (ref >39.00)
LDL Cholesterol: 154 mg/dL — ABNORMAL HIGH (ref 0–99)
NonHDL: 175.92
Total CHOL/HDL Ratio: 4
Triglycerides: 110 mg/dL (ref 0.0–149.0)
VLDL: 22 mg/dL (ref 0.0–40.0)

## 2020-12-02 LAB — CBC WITH DIFFERENTIAL/PLATELET
Basophils Absolute: 0.1 10*3/uL (ref 0.0–0.1)
Basophils Relative: 1.5 % (ref 0.0–3.0)
Eosinophils Absolute: 1.9 10*3/uL — ABNORMAL HIGH (ref 0.0–0.7)
Eosinophils Relative: 23.6 % — ABNORMAL HIGH (ref 0.0–5.0)
HCT: 43.5 % (ref 36.0–46.0)
Hemoglobin: 14 g/dL (ref 12.0–15.0)
Lymphocytes Relative: 20.6 % (ref 12.0–46.0)
Lymphs Abs: 1.6 10*3/uL (ref 0.7–4.0)
MCHC: 32.3 g/dL (ref 30.0–36.0)
MCV: 89.7 fl (ref 78.0–100.0)
Monocytes Absolute: 0.7 10*3/uL (ref 0.1–1.0)
Monocytes Relative: 8.3 % (ref 3.0–12.0)
Neutro Abs: 3.6 10*3/uL (ref 1.4–7.7)
Neutrophils Relative %: 46 % (ref 43.0–77.0)
Platelets: 243 10*3/uL (ref 150.0–400.0)
RBC: 4.84 Mil/uL (ref 3.87–5.11)
RDW: 14.3 % (ref 11.5–15.5)
WBC: 7.9 10*3/uL (ref 4.0–10.5)

## 2020-12-02 LAB — COMPREHENSIVE METABOLIC PANEL
ALT: 18 U/L (ref 0–35)
AST: 21 U/L (ref 0–37)
Albumin: 4.4 g/dL (ref 3.5–5.2)
Alkaline Phosphatase: 156 U/L — ABNORMAL HIGH (ref 39–117)
BUN: 19 mg/dL (ref 6–23)
CO2: 30 mEq/L (ref 19–32)
Calcium: 10.1 mg/dL (ref 8.4–10.5)
Chloride: 102 mEq/L (ref 96–112)
Creatinine, Ser: 0.97 mg/dL (ref 0.40–1.20)
GFR: 59.25 mL/min — ABNORMAL LOW (ref >60.00)
Glucose, Bld: 82 mg/dL (ref 70–99)
Potassium: 4.4 mEq/L (ref 3.5–5.1)
Sodium: 139 mEq/L (ref 135–145)
Total Bilirubin: 0.8 mg/dL (ref 0.2–1.2)
Total Protein: 7.2 g/dL (ref 6.0–8.3)

## 2020-12-02 LAB — VITAMIN D 25 HYDROXY (VIT D DEFICIENCY, FRACTURES): VITD: 32.83 ng/mL (ref 30.00–100.00)

## 2020-12-02 LAB — TSH: TSH: 2.3 u[IU]/mL (ref 0.35–5.50)

## 2020-12-02 LAB — HEMOGLOBIN A1C: Hgb A1c MFr Bld: 5.8 % (ref 4.6–6.5)

## 2020-12-02 MED ORDER — ALPRAZOLAM 0.25 MG PO TABS: 0.1250 mg | ORAL_TABLET | Freq: Two times a day (BID) | ORAL | 0 refills | Status: DC | PRN

## 2020-12-02 NOTE — Progress Notes (Signed)
Patient ID: Danielle Fox, female    DOB: 01/20/1951  Age: 70 y.o. MRN: [id number]    Subjective:   Chief Complaint  Patient presents with   Annual Exam   Subjective  HPI [name] presents for office visit today for comprehensive physical exam today and follow up on management of chronic concerns. She is flying to [place] very soon and will stay there for roughly 3 weeks. She was wondering if she should take covid bivalent shot before going.  Denies CP/palp/SOB/HA/congestion/fevers/GI or GU c/o. Taking meds as prescribed. She is doing yard to stay active. cough comes and goes. No blood in stool recently and still taking omeprazole, however she needs an iFOB test to confirm.   Review of Systems  Constitutional:  Negative for chills, fatigue and fever.  HENT:  Negative for congestion, rhinorrhea, sinus pressure, sinus pain, sore throat and trouble swallowing.   Eyes:  Negative for pain.  Respiratory:  Positive for cough (comes and goes). Negative for shortness of breath.   Cardiovascular:  Negative for chest pain, palpitations and leg swelling.  Gastrointestinal:  Negative for abdominal pain, blood in stool, diarrhea, nausea and vomiting.  Genitourinary:  Negative for decreased urine volume, flank pain, frequency, vaginal bleeding and vaginal discharge.  Musculoskeletal:  Negative for back pain.  Neurological:  Negative for headaches.   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   Her family history includes Alcohol abuse in her father; Cirrhosis in her father; Heart disease in her father and mother.She reports that she quit smoking about 6 years ago. Her smoking use included cigarettes. She has a 48.00 pack-year smoking history. She has never used smokeless tobacco. She reports current alcohol use. She reports that she does not use drugs.  Current Outpatient Medications on File Prior to Visit  Medication Sig Dispense Refill   acetaminophen (TYLENOL) 325 MG tablet Take 650 mg by mouth every 6 (six) hours as needed for mild pain.      budesonide-formoterol (SYMBICORT) 160-4.5 MCG/ACT inhaler Inhale 2 puffs into the lungs in the morning and at bedtime. 1 each 2   esomeprazole (NEXIUM) 40 MG capsule Take 1 capsule (40 mg total) by mouth daily before breakfast. 90 capsule 3   No current facility-administered medications on file prior to visit.     Objective:  Objective  Physical Exam Constitutional:      General: She is not in acute distress.    Appearance: Normal appearance. She is not ill-appearing or toxic-appearing.  HENT:     Head: Normocephalic and atraumatic.     Right Ear: Tympanic membrane, ear canal and external ear normal.     Left Ear: Tympanic membrane, ear canal and external ear normal.     Nose: No congestion or rhinorrhea.  Eyes:     Extraocular Movements: Extraocular movements intact.     Right eye: No nystagmus.     Left eye: No nystagmus.  Pupils: Pupils are equal, round, and reactive to light.  Cardiovascular:     Rate and Rhythm: Normal rate and regular rhythm.     Pulses: Normal pulses.     Heart sounds: Normal heart sounds. No murmur heard. Pulmonary:     Effort: Pulmonary effort is normal. No respiratory distress.     Breath sounds: Normal breath sounds. No wheezing, rhonchi or rales.  Abdominal:     General: Bowel sounds are normal.     Palpations: Abdomen is  soft. There is no mass.     Tenderness: There is no abdominal tenderness. There is no guarding.     Hernia: No hernia is present.  Musculoskeletal:        General: Normal range of motion.     Cervical back: Normal range of motion and neck supple.  Skin:    General: Skin is warm and dry.  Neurological:     Mental Status: She is alert and oriented to person, place, and time.     Cranial Nerves: No facial asymmetry.     Motor: Motor function is intact. No weakness.     Deep Tendon Reflexes:     Reflex Scores:      Patellar reflexes are 2+ on the right side and 2+ on the left side. Psychiatric:        Behavior: Behavior normal.   BP 100/64   Pulse 85   Temp 97.8 F (36.6 C)   Resp 16   Ht 5\' 1"  (1.549 m)   Wt 123 lb 12.8 oz (56.2 kg)   SpO2 94%   BMI 23.39 kg/m  Wt Readings from Last 3 Encounters:  12/02/20 123 lb 12.8 oz (56.2 kg)  10/02/20 123 lb 8 oz (56 kg)  07/10/20 121 lb 12.8 oz (55.2 kg)     Lab Results  Component Value Date   WBC 7.3 05/13/2020   HGB 12.7 05/13/2020   HCT 38.6 05/13/2020   PLT 251.0 05/13/2020   GLUCOSE 83 06/16/2020   CHOL 121 09/28/2019   TRIG 70 09/28/2019   HDL 47 09/28/2019   LDLDIRECT 155.9 12/14/2012   LDLCALC 60 09/28/2019   ALT 22 06/16/2020   AST 22 06/16/2020   NA 142 06/16/2020   K 4.1 06/16/2020   CL 107 06/16/2020   CREATININE 1.01 06/16/2020   BUN 17 06/16/2020   CO2 26 06/16/2020   TSH 1.86 05/13/2020   INR 1.0 09/27/2019   HGBA1C 5.9 (H) 09/28/2019    MR BRAIN W WO CONTRAST  Result Date: 09/29/2020 Formatting of this result is different from the original.  [facility] [address], [place], [place] [address] [phone] NEUROIMAGING REPORT STUDY DATE: 09/29/2020 PATIENT NAME: Danielle Fox DOB: March 02, 1950 MRN: [id number] EXAM: MRI Brain with and without contrast ORDERING CLINICIAN: Richard A. Sater, MD. PhD CLINICAL HISTORY: 70 year old woman with history of cerebral arteritis COMPARISON  FILMS: MRI 02/13/2020 and MRI 10/29/2019 TECHNIQUE:MRI of the brain with and without contrast was obtained utilizing 5 mm axial slices with T1, T2, T2 flair, SWI and diffusion weighted views.  T1 sagittal, T2 coronal and postcontrast views in the axial and coronal plane were obtained. CONTRAST: 11 ml [facility] IMAGING SITE: [facility], [place]. FINDINGS: On sagittal images, the spinal cord is imaged caudally to C4 and is normal in caliber.   The contents of the posterior fossa are of normal size and position.   The pituitary gland and optic chiasm appear normal.  Brain volume appears normal for age.   However, near the vertex sulci are larger on the left than the right, unchanged compared to the 02/13/2020 MRI.  The ventricles are normal in size and without distortion.  There are no abnormal extra-axial collections of fluid.  In the hemispheres, there are just a couple punctate T2/FLAIR hyperintense foci in the white matter, normal for age.  This is unchanged compared to the 02/13/2020 MRI.  The cerebellum, brainstem and deep gray matter appears normal.   Diffusion weighted images are normal.  Susceptibility weighted images are normal.     The orbits appear normal.   The VIIth/VIIIth nerve complex appears normal.  The mastoid air cells appear normal.  The paranasal sinuses appear normal.  Flow voids are identified within the major intracerebral arteries.    After the infusion of contrast material, a normal enhancement pattern is noted.    This is unchanged compared to the 02/13/2020 MRI.  The abnormal pachymeningeal enhancement noted on the 10/29/2019 MRI has resolved. Impression:   This MRI of the brain with and without contrast shows the following: 1.   Brain parenchyma is normal for age. 2.   No change compared to the 02/13/2020 MRI.  The abnormal pachymeningeal enhancement noted on the 10/29/2019 MRI has resolved. 3.   Asymmetry of the sulcal pattern near the vertex, larger on the left.   This has been noted on previous MRI as well and appears unchanged.  This is unlikely to be clinically significant. 4.   Normal enhancement pattern.  No acute findings. INTERPRETING PHYSICIAN: Richard A. Sater, MD, PhD, FAAN Certified in  Neuroimaging by [employer]     Assessment & Plan:  Plan    Meds ordered this encounter  Medications   ALPRAZolam (XANAX) 0.25 MG tablet    Sig: Take 0.5-2 tablets (0.125-0.5 mg total) by mouth 2 (two) times daily as needed for anxiety or sleep.    Dispense:  20 tablet    Refill:  0     Problem List Items Addressed This Visit     Osteopenia    Encouraged to get adequate exercise, calcium and vitamin d intake      Hyperlipidemia, mild    Tolerating statin, encouraged heart healthy diet, avoid trans fats, minimize simple carbs and saturated fats. Increase exercise as tolerated      Chronic cough    Stable and tolerable, responds to Albuterol      Preventative health care - Primary    Patient encouraged to maintain heart healthy diet, regular exercise, adequate sleep. Consider daily probiotics. Take medications as prescribed. Encourage heart healthy diet such as MIND or DASH diet, increase exercise, avoid trans fats, simple carbohydrates and processed foods, consider a krill or fish or flaxseed oil cap daily. Can use Alprazolam prn for travel anxiety/insomnia      Relevant Orders   Hemoglobin A1c   CBC with Differential/Platelet   Comprehensive metabolic panel   Lipid panel   TSH   Vitamin D deficiency    Supplement and monitor      Relevant Orders   VITAMIN D 25 Hydroxy (Vit-D Deficiency, Fractures)   Adnexal mass    She is following the GYN Dr Otho Najjar for a left ovarian cyst. Will request records      Anemia    With heme positive stool recheck ifob today. Patient has not seen any blood. Consider referral to GI if worsens      Relevant Orders  Hemoglobin A1c   CBC with Differential/Platelet   Comprehensive  metabolic panel   Lipid panel   TSH   Other Visit Diagnoses     Need for influenza vaccination       Relevant Orders   Flu Vaccine QUAD High Dose(Fluad) (Completed)   Post-menopausal       Relevant Orders   DG Bone Density   Estrogen deficiency       Relevant Orders   DG Bone Density   Heme positive stool       Relevant Orders   Fecal occult blood, imunochemical       Follow-up: Return in about 6 months (around 06/02/2021) for f/u v.  I, Suezanne Jacquet, acting as a scribe for Penni Homans, MD, have documented all relevent documentation on behalf of Penni Homans, MD, as directed by Penni Homans, MD while in the presence of Penni Homans, MD. DO:12/02/20.  I, Mosie Lukes, MD personally performed the services described in this documentation. All medical record entries made by the scribe were at my direction and in my presence. I have reviewed the chart and agree that the record reflects my personal performance and is accurate and complete

## 2020-12-02 NOTE — Assessment & Plan Note (Signed)
Tolerating statin, encouraged heart healthy diet, avoid trans fats, minimize simple carbs and saturated fats. Increase exercise as tolerated 

## 2020-12-02 NOTE — Assessment & Plan Note (Signed)
Supplement and monitor 

## 2020-12-02 NOTE — Assessment & Plan Note (Signed)
Encouraged to get adequate exercise, calcium and vitamin d intake 

## 2020-12-02 NOTE — Assessment & Plan Note (Signed)
With heme positive stool recheck ifob today. Patient has not seen any blood. Consider referral to GI if worsens

## 2020-12-02 NOTE — Assessment & Plan Note (Addendum)
Patient encouraged to maintain heart healthy diet, regular exercise, adequate sleep. Consider daily probiotics. Take medications as prescribed. Encourage heart healthy diet such as MIND or DASH diet, increase exercise, avoid trans fats, simple carbohydrates and processed foods, consider a krill or fish or flaxseed oil cap daily. Can use Alprazolam prn for travel anxiety/insomnia

## 2020-12-02 NOTE — Patient Instructions (Addendum)
- [facility] downstairs offers bivalent covid shot from 9 am to 3 pm.   -Paxlovid and/or molnupiravir is the new COVID medication we can give you if you get COVID so make sure you test if you have symptoms because we have to treat by day 5 of symptoms for it to be effective. If you are positive let us know so we can treat. If a home test is negative and your symptoms are persistent get a PCR test. Can check testing locations at [email or web address] If you are positive we will make an appointment with [place] and we will send in Paxlovid/molnupiravir if you would like it. Check with your pharmacy before we meet to confirm they have it in stock, if they do not then we can get the prescription at the [facility].   Preventive Care 16 Years and Older, Female Preventive care refers to lifestyle choices and visits with your health care provider that can promote health and wellness. This includes: A yearly physical exam. This is also called an annual wellness visit. Regular dental and eye exams. Immunizations. Screening for certain conditions. Healthy lifestyle choices, such as: Eating a healthy diet. Getting regular exercise. Not using drugs or products that contain nicotine and tobacco. Limiting alcohol use. What can I expect for my preventive care visit? Physical exam Your health care provider will check your: Height and weight. These may be used to calculate your BMI (body mass index). BMI is a measurement that tells if you are at a healthy weight. Heart rate and blood pressure. Body temperature. Skin for abnormal spots. Counseling Your health care provider may ask you questions about your: Past medical problems. Family's medical history. Alcohol, tobacco, and drug use. Emotional well-being. Home life and relationship well-being. Sexual activity. Diet, exercise, and sleep habits. History of falls. Memory and ability to understand (cognition). Work and work  Statistician. Pregnancy and menstrual history. Access to firearms. What immunizations do I need? Vaccines are usually given at various ages, according to a schedule. Your health care provider will recommend vaccines for you based on your age, medical history, and lifestyle or other factors, such as travel or where you work. What tests do I need? Blood tests Lipid and cholesterol levels. These may be checked every 5 years, or more often depending on your overall health. Hepatitis C test. Hepatitis B test. Screening Lung cancer screening. You may have this screening every year starting at age 56 if you have a 30-pack-year history of smoking and currently smoke or have quit within the past 15 years. Colorectal cancer screening. All adults should have this screening starting at age 29 and continuing until age 72. Your health care provider may recommend screening at age 11 if you are at increased risk. You will have tests every 1-10 years, depending on your results and the type of screening test. Diabetes screening. This is done by checking your blood sugar (glucose) after you have not eaten for a while (fasting). You may have this done every 1-3 years. Mammogram. This may be done every 1-2 years. Talk with your health care provider about how often you should have regular mammograms. Abdominal aortic aneurysm (AAA) screening. You may need this if you are a current or former smoker. BRCA-related cancer screening. This may be done if you have a family history of breast, ovarian, tubal, or peritoneal cancers. Other tests STD (sexually transmitted disease) testing, if you are at risk. Bone density scan. This is done to screen for  osteoporosis. You may have this done starting at age 32. Talk with your health care provider about your test results, treatment options, and if necessary, the need for more tests. Follow these instructions at home: Eating and drinking  Eat a diet that includes fresh  fruits and vegetables, whole grains, lean protein, and low-fat dairy products. Limit your intake of foods with high amounts of sugar, saturated fats, and salt. Take vitamin and mineral supplements as recommended by your health care provider. Do not drink alcohol if your health care provider tells you not to drink. If you drink alcohol: Limit how much you have to 0-1 drink a day. Be aware of how much alcohol is in your drink. In the [place] one drink equals one 12 oz bottle of beer (355 mL), one 5 oz glass of wine (148 mL), or one 1 oz glass of hard liquor (44 mL). Lifestyle Take daily care of your teeth and gums. Brush your teeth every morning and night with fluoride toothpaste. Floss one time each day. Stay active. Exercise for at least 30 minutes 5 or more days each week. Do not use any products that contain nicotine or tobacco, such as cigarettes, e-cigarettes, and chewing tobacco. If you need help quitting, ask your health care provider. Do not use drugs. If you are sexually active, practice safe sex. Use a condom or other form of protection in order to prevent STIs (sexually transmitted infections). Talk with your health care provider about taking a low-dose aspirin or statin. Find healthy ways to cope with stress, such as: Meditation, yoga, or listening to music. Journaling. Talking to a trusted person. Spending time with friends and family. Safety Always wear your seat belt while driving or riding in a vehicle. Do not drive: If you have been drinking alcohol. Do not ride with someone who has been drinking. When you are tired or distracted. While texting. Wear a helmet and other protective equipment during sports activities. If you have firearms in your house, make sure you follow all gun safety procedures. What's next? Visit your health care provider once a year for an annual wellness visit. Ask your health care provider how often you should have your eyes and teeth checked. Stay  up to date on all vaccines. This information is not intended to replace advice given to you by your health care provider. Make sure you discuss any questions you have with your health care provider. Document Revised: 04/25/2020 Document Reviewed: 02/09/2018 Elsevier Patient Education  2022 [employer].

## 2020-12-02 NOTE — Assessment & Plan Note (Signed)
Stable and tolerable, responds to Albuterol

## 2020-12-02 NOTE — Assessment & Plan Note (Signed)
She is following the GYN Dr Otho Najjar for a left ovarian cyst. Will request records

## 2020-12-03 ENCOUNTER — Encounter: Payer: Self-pay | Admitting: *Deleted

## 2020-12-03 NOTE — Addendum Note (Signed)
Addended by: Kelle Darting A on: 12/03/2020 11:21 AM   Modules accepted: Orders

## 2020-12-04 ENCOUNTER — Telehealth: Payer: Self-pay

## 2020-12-04 ENCOUNTER — Other Ambulatory Visit (INDEPENDENT_AMBULATORY_CARE_PROVIDER_SITE_OTHER): Payer: Medicare Other

## 2020-12-04 DIAGNOSIS — R195 Other fecal abnormalities: Secondary | ICD-10-CM

## 2020-12-04 LAB — FECAL OCCULT BLOOD, IMMUNOCHEMICAL: Fecal Occult Bld: POSITIVE — AB

## 2020-12-04 NOTE — Telephone Encounter (Signed)
Positive Ifob   Caller: Cristal Ford   Receiver: Manuela Schwartz, RMA   Date and time:  12/04/2020 at 8:56 am

## 2020-12-04 NOTE — Telephone Encounter (Signed)
See result notes. 

## 2020-12-08 ENCOUNTER — Encounter: Payer: Self-pay | Admitting: *Deleted

## 2020-12-22 ENCOUNTER — Other Ambulatory Visit: Payer: Self-pay | Admitting: Family Medicine

## 2020-12-23 ENCOUNTER — Encounter: Payer: Self-pay | Admitting: Family Medicine

## 2020-12-25 ENCOUNTER — Telehealth: Payer: Self-pay | Admitting: Family Medicine

## 2020-12-25 NOTE — Telephone Encounter (Signed)
Pt called to go over test results please advise.

## 2020-12-25 NOTE — Telephone Encounter (Signed)
Lab letter sent 10/5 , will address labs at visit on 10/28

## 2020-12-26 ENCOUNTER — Ambulatory Visit (HOSPITAL_BASED_OUTPATIENT_CLINIC_OR_DEPARTMENT_OTHER)
Admission: RE | Admit: 2020-12-26 | Discharge: 2020-12-26 | Disposition: A | Discharge status: Home / Self Care | Payer: Medicare Other | Source: Ambulatory Visit | Attending: Medical | Admitting: Medical

## 2020-12-26 ENCOUNTER — Telehealth (INDEPENDENT_AMBULATORY_CARE_PROVIDER_SITE_OTHER): Payer: Medicare Other | Admitting: Medical

## 2020-12-26 ENCOUNTER — Encounter: Payer: Self-pay | Admitting: Medical

## 2020-12-26 ENCOUNTER — Other Ambulatory Visit: Payer: Self-pay

## 2020-12-26 VITALS — BP 98/60 | HR 97 | Temp 99.0°F | Ht 62.0 in | Wt 120.0 lb

## 2020-12-26 DIAGNOSIS — J40 Bronchitis, not specified as acute or chronic: Secondary | ICD-10-CM | POA: Insufficient documentation

## 2020-12-26 DIAGNOSIS — R058 Other specified cough: Secondary | ICD-10-CM

## 2020-12-26 DIAGNOSIS — R062 Wheezing: Secondary | ICD-10-CM | POA: Diagnosis not present

## 2020-12-26 DIAGNOSIS — R059 Cough, unspecified: Secondary | ICD-10-CM | POA: Diagnosis not present

## 2020-12-26 MED ORDER — AMOXICILLIN-POT CLAVULANATE 875-125 MG PO TABS: 1.0000 | ORAL_TABLET | Freq: Two times a day (BID) | ORAL | 0 refills | Status: DC

## 2020-12-26 MED ORDER — PREDNISONE 10 MG (21) PO TBPK: ORAL_TABLET | ORAL | 0 refills | Status: DC

## 2020-12-26 MED ORDER — BENZONATATE 100 MG PO CAPS: 100.0000 mg | ORAL_CAPSULE | Freq: Three times a day (TID) | ORAL | 0 refills | Status: DC | PRN

## 2020-12-26 NOTE — Progress Notes (Signed)
Subjective:    Patient ID: Danielle Fox, female    DOB: March 29, 1950, 70 y.o.   MRN: [id number]  HPI  History of Present Illness:  Covid test negative yesterday. Asked for pt to come in to see in person. Since wanted cxr and she did not have 02 sat monitor at home.  Pt states she got seen on vacation about 10 days ago. Pt states some nasal congestin and chest congestion. She is coughing up mucus and when blows nose get mucus. No fever, no chills or sweat. Pt at times feels wheezy and sob.   Pt does have history of smoking and uses symbicort on occasion. Pt not using this consistently.   Hx of smoking for 50 yrs. Pack a day when she did smoke.  Ct chest 10-01-2019  IMPRESSION: 1. Minimal nonspecific biapical pleuroparenchymal scarring and centrilobular nodularity, not significantly changed compared to prior examination. No evidence of fibrotic interstitial lung disease. No specific features to suggest pulmonary sarcoidosis. Centrilobular nodularity noted is most commonly seen in smoking-related respiratory bronchiolitis. 2. Numerous prominent, although not pathologically enlarged mediastinal and hilar lymph nodes, unchanged compared to prior examination. No calcified lymph nodes. No specific features to suggest nodal sarcoidosis. 3. Aortic Atherosclerosis (ICD10-I70.0).   Pt is not diabetic. Pt states get bronchitis easily in the past.    Observations/Objective:   General- No acute distress. Pleasant patient. Neck- Full range of motion, no jvd Lungs- even and unlabored. But rough breath sounds bilaterally. Heart- regular rate and rhythm. Neurologic- CNII- XII grossly intact.  Lower ext- no pedal edema.   Assessment and Plan:  Patient Instructions  2 weeks of nasal congestion, chest congestion and a productive cough.  History of smoking for 15 years and wheezing recently as well.  Will follow chest x-ray results.  If no pneumonia seen then will prescribe Augmentin.   If pneumonia seen then will prescribe levofloxacin.  Note prefer to right for azithromycin or doxycycline but you had side effects to both.   For wheezing prescribing 6-day tapered dose of prednisone and want you to use Symbicort daily.  Also I want you to talk with radiology department about getting scheduled for follow-up CT chest.  This is done for routine screening purposes but in addition your last CT did show prominent lymph nodes.  That order was already placed.    Mackie Pai, PA-C     Follow Up Instructions:    I discussed the assessment and treatment plan with the patient. The patient was provided an opportunity to ask questions and all were answered. The patient agreed with the plan and demonstrated an understanding of the instructions.   The patient was advised to call back or seek an in-person evaluation if the symptoms worsen or if the condition fails to improve as anticipated.     Mackie Pai, PA-C    Visit done in person as stated beginning of note. Needed to see in person as needed chest xray as well  Review of Systems  Constitutional:  Negative for chills, fatigue and fever.  Respiratory:  Positive for cough and wheezing. Negative for chest tightness and shortness of breath.   Cardiovascular:  Negative for chest pain and palpitations.  Gastrointestinal:  Negative for abdominal pain.  Musculoskeletal:  Negative for back pain.  Skin:  Negative for rash.  Neurological:  Negative for dizziness, speech difficulty, weakness and numbness.  Hematological:  Negative for adenopathy. Does not bruise/bleed easily.  Psychiatric/Behavioral:  Negative for behavioral problems and confusion.  Objective:   Physical Exam        Assessment & Plan:

## 2020-12-26 NOTE — Patient Instructions (Addendum)
2 weeks of nasal congestion, chest congestion and a productive cough.  History of smoking for 15 years and wheezing recently as well.  Will follow chest x-ray results.  If no pneumonia seen then will prescribe Augmentin.  If pneumonia seen then will prescribe levofloxacin.  Note prefer to right for azithromycin or doxycycline but you had side effects to both.  Benzonatate for cough   For wheezing prescribing 6-day tapered dose of prednisone and want you to use Symbicort daily.  Also I want you to talk with radiology department about getting scheduled for follow-up CT chest.  This is done for routine screening purposes but in addition your last CT did show prominent lymph nodes.  That order was already placed.   Follow up 7-10 days or sooner if needed.

## 2020-12-26 NOTE — Addendum Note (Signed)
Addended by: Anabel Halon on: 12/26/2020 01:23 PM   Modules accepted: Orders

## 2020-12-31 ENCOUNTER — Ambulatory Visit: Payer: Medicare Other | Admitting: Medical

## 2021-01-06 ENCOUNTER — Other Ambulatory Visit (HOSPITAL_BASED_OUTPATIENT_CLINIC_OR_DEPARTMENT_OTHER): Payer: Medicare Other

## 2021-01-06 ENCOUNTER — Ambulatory Visit (HOSPITAL_BASED_OUTPATIENT_CLINIC_OR_DEPARTMENT_OTHER): Payer: Medicare Other

## 2021-01-12 ENCOUNTER — Ambulatory Visit (HOSPITAL_BASED_OUTPATIENT_CLINIC_OR_DEPARTMENT_OTHER)
Admission: RE | Admit: 2021-01-12 | Discharge: 2021-01-12 | Disposition: A | Discharge status: Home / Self Care | Payer: Medicare Other | Source: Ambulatory Visit | Attending: Acute Care | Admitting: Acute Care

## 2021-01-12 ENCOUNTER — Ambulatory Visit (HOSPITAL_BASED_OUTPATIENT_CLINIC_OR_DEPARTMENT_OTHER)
Admission: RE | Admit: 2021-01-12 | Discharge: 2021-01-12 | Disposition: A | Discharge status: Home / Self Care | Payer: Medicare Other | Source: Ambulatory Visit | Attending: Family Medicine | Admitting: Family Medicine

## 2021-01-12 ENCOUNTER — Other Ambulatory Visit: Payer: Self-pay

## 2021-01-12 DIAGNOSIS — E2839 Other primary ovarian failure: Secondary | ICD-10-CM | POA: Diagnosis not present

## 2021-01-12 DIAGNOSIS — Z780 Asymptomatic menopausal state: Secondary | ICD-10-CM | POA: Insufficient documentation

## 2021-01-12 DIAGNOSIS — M8589 Other specified disorders of bone density and structure, multiple sites: Secondary | ICD-10-CM | POA: Diagnosis not present

## 2021-01-12 DIAGNOSIS — Z87891 Personal history of nicotine dependence: Secondary | ICD-10-CM | POA: Diagnosis not present

## 2021-01-13 ENCOUNTER — Telehealth: Payer: Self-pay | Admitting: Acute Care

## 2021-01-13 NOTE — Telephone Encounter (Signed)
Call report from [facility] with [facility]:  IMPRESSION: 1. Lung-RADS 2, benign appearance or behavior. Continue annual screening with low-dose chest CT without contrast in 12 months. 2. Mild mediastinal lymphadenopathy, mildly progressive since 09/29/2019. Follow-up standard CT chest with contrast recommended in 3 months to ensure stability. 3. Aortic Atherosclerosis (ICD10-I70.0) and Emphysema (ICD10-J43.9).   These results will be called to the ordering clinician or representative by the Radiologist Assistant, and communication documented in the PACS or [employer].

## 2021-01-14 ENCOUNTER — Other Ambulatory Visit: Payer: Self-pay

## 2021-01-14 ENCOUNTER — Other Ambulatory Visit: Payer: Self-pay | Admitting: Acute Care

## 2021-01-14 DIAGNOSIS — R590 Localized enlarged lymph nodes: Secondary | ICD-10-CM

## 2021-01-14 DIAGNOSIS — Z87891 Personal history of nicotine dependence: Secondary | ICD-10-CM

## 2021-01-14 NOTE — Telephone Encounter (Signed)
I have called and spoken with the patient about the results of her low-dose CT.  I explained that her scan was read as a lung RADS 2, annual lung cancer screening recommended. I also explained that there was notation of mild mediastinal lymphadenopathy that have progressed slightly since 09/29/2019.  I have spoken with Dr. Valeta Harms about the scan and he recommended PET CT to evaluate this progressive lymphadenopathy for sarcoid.  Patient was in agreement with this plan.  I have placed the order for the PET scan, and Langley Gauss if you will make sure patient has follow-up with either Dr. Valeta Harms or Dr. Lamonte Sakai after the PET.  In one of their nodule slots.  Please fax results to PCP and placed 63-month annual screening follow-up. Thank so much

## 2021-01-14 NOTE — Progress Notes (Signed)
Please call patient and let them  know their  low dose Ct was read as a Lung RADS 2: nodules that are benign in appearance and behavior with a very low likelihood of becoming a clinically active cancer due to size or lack of growth. Recommendation per radiology is for a repeat LDCT in 12 months. .Please let them  know we will order and schedule their  annual screening scan for 12/2021. Please let them  know there was notation of CAD on their  scan.  Please remind the patient  that this is a non-gated exam therefore degree or severity of disease  cannot be determined. Please have them  follow up with their PCP regarding potential risk factor modification, dietary therapy or pharmacologic therapy if clinically indicated. Pt.  is not  currently on statin therapy.   Danielle Fox,I called this patient and reviewed the results of her scan. She has progressive lymphadenopathy that is suspicious for sarcoid. We are going to order a PET scan and have her follow up with a pulmonologist after the PET scan,. Can you get her on   A docs schedule after the PET? Thank You!

## 2021-01-16 NOTE — Telephone Encounter (Signed)
Contacted patient patient - scheduled/confirmed appointment for 02/02/21 to review PET scan results with Dr. Valeta Harms.

## 2021-01-27 ENCOUNTER — Ambulatory Visit (HOSPITAL_COMMUNITY)
Admission: RE | Admit: 2021-01-27 | Discharge: 2021-01-27 | Disposition: A | Discharge status: Home / Self Care | Payer: Medicare Other | Source: Ambulatory Visit | Attending: Acute Care | Admitting: Acute Care

## 2021-01-27 ENCOUNTER — Other Ambulatory Visit: Payer: Self-pay

## 2021-01-27 DIAGNOSIS — R590 Localized enlarged lymph nodes: Secondary | ICD-10-CM | POA: Diagnosis not present

## 2021-01-27 DIAGNOSIS — R918 Other nonspecific abnormal finding of lung field: Secondary | ICD-10-CM | POA: Diagnosis not present

## 2021-01-27 LAB — GLUCOSE, CAPILLARY: Glucose-Capillary: 89 mg/dL (ref 70–99)

## 2021-01-27 MED ORDER — FLUDEOXYGLUCOSE F - 18 (FDG) INJECTION
6.0000 | Freq: Once | INTRAVENOUS | Status: AC | PRN
  Administered 2021-01-27: 0.74 via INTRAVENOUS

## 2021-02-02 ENCOUNTER — Telehealth: Payer: Self-pay | Admitting: Pulmonary Disease

## 2021-02-02 ENCOUNTER — Encounter: Payer: Self-pay | Admitting: Pulmonary Disease

## 2021-02-02 ENCOUNTER — Other Ambulatory Visit: Payer: Self-pay

## 2021-02-02 ENCOUNTER — Ambulatory Visit: Payer: Medicare Other | Admitting: Pulmonary Disease

## 2021-02-02 VITALS — BP 118/68 | HR 81 | Temp 97.7°F | Ht 61.0 in | Wt 120.8 lb

## 2021-02-02 DIAGNOSIS — R590 Localized enlarged lymph nodes: Secondary | ICD-10-CM | POA: Diagnosis not present

## 2021-02-02 DIAGNOSIS — R948 Abnormal results of function studies of other organs and systems: Secondary | ICD-10-CM | POA: Diagnosis not present

## 2021-02-02 NOTE — Progress Notes (Signed)
Synopsis: Referred in December 2022 for lung nodule by Mosie Lukes, MD  Subjective:   PATIENT ID: Danielle Fox GENDER: female DOB: 08/21/1950, MRN: [id number]  Chief Complaint  Patient presents with   Consult    Patient says she had a CT and they saw some nodules.     This is a 70 year old female, past medical history of COPD, prior positive PPD.Present for evaluation of lung nodule.  Patient was enrolled in lung cancer screening program had an abnormal lung cancer screening CT on 01/12/2021.  Had mild associated mediastinal adenopathy she had multiple small but hypermetabolic mediastinal and hilar nodes.  Additionally a small right supraclavicular cervical and left axillary node.  Other small bilateral multiple pulmonary nodules.  Concerning for possible underlying granulomatous disease.  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 Felecia Shelling a few years prior.  She has subsequently recovered from this.  She also had a recent URI and was treated with antibiotics and steroids.  This is getting better.   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   Preventative health care 06/29/2016   Pruritus 06/29/2016   Pyloric stenosis in adult 05/2019   Welcome to Medicare preventive visit 06/29/2016     Family History  Problem Relation Age of Onset   Heart disease Mother        cabg, MI   Alcohol abuse Father    Cirrhosis Father    Heart disease Father        MI   Colon cancer Neg Hx    Colon polyps Neg Hx    Esophageal cancer Neg Hx    Stomach cancer Neg Hx    Rectal cancer Neg Hx      Past Surgical History:  Procedure Laterality Date   ABDOMINAL HYSTERECTOMY     APPENDECTOMY     COLONOSCOPY     01-14-2005   SINUS  IRRIGATION  04/02/2015   UPPER GASTROINTESTINAL ENDOSCOPY  11/22/2018    Social History   Socioeconomic History   Marital status: Married    Spouse name: Not on file   Number of children: 3   Years of education: Not on file   Highest education level: Not on file  Occupational History   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[employer], retired    Fish farm manager: [employer]  Tobacco Use   Smoking status: Former    Packs/day: 1.00    Years: 48.00    Pack years: 48.00    Types: Cigarettes    Quit date: 02/19/2014    Years since quitting: 6.9   Smokeless tobacco: Never   Tobacco comments:    Quit and has no desire to smoke again,  Vaping Use   Vaping Use: Never used  Substance and Sexual Activity   Alcohol use: Yes    Alcohol/week: 0.0 standard drinks    Comment: socially   Drug use: No   Sexual activity: Yes    Partners: Male  Other Topics Concern   Not on file  Social History Narrative   Exercise-- walks dog on occasion   Right Handed   One Story Home   Does not drink caffeine    Social Determinants of Radio broadcast assistant  Strain: Low Risk    Difficulty of Paying Living Expenses: Not hard at all  Food Insecurity: No Food Insecurity   Worried About Charity fundraiser in the Last Year: Never true   Ran Out of Food in the Last Year: Never true  Transportation Needs: No Transportation Needs   Lack of Transportation (Medical): No   Lack of Transportation (Non-Medical): No  Physical Activity: Inactive   Days of Exercise per Week: 0 days   Minutes of Exercise per Session: 0 min  Stress: No Stress Concern Present   Feeling of Stress : Not at all  Social Connections: Not on file  Intimate Partner Violence: Not At Risk   Fear of Current or Ex-Partner: No   Emotionally Abused: No   Physically Abused: No   Sexually Abused: No     Allergies  Allergen Reactions   Doxycycline Nausea And Vomiting   Vancomycin     Nausea and abdominal pain   Erythromycin Base Nausea Only      Outpatient Medications Prior to Visit  Medication Sig Dispense Refill   acetaminophen (TYLENOL) 325 MG tablet Take 650 mg by mouth every 6 (six) hours as needed for mild pain.      ALPRAZolam (XANAX) 0.25 MG tablet Take 0.5-2 tablets (0.125-0.5 mg total) by mouth 2 (two) times daily as needed for anxiety or sleep. 20 tablet 0   amoxicillin-clavulanate (AUGMENTIN) 875-125 MG tablet Take 1 tablet by mouth 2 (two) times daily. 20 tablet 0   benzonatate (TESSALON) 100 MG capsule Take 1 capsule (100 mg total) by mouth 3 (three) times daily as needed for cough. 30 capsule 0   budesonide-formoterol (SYMBICORT) 160-4.5 MCG/ACT inhaler INHALE 2 PUFFS INTO THE LUNGS IN THE MORNING AND AT BEDTIME 10.2 g 5   esomeprazole (NEXIUM) 40 MG capsule Take 1 capsule (40 mg total) by mouth daily before breakfast. 90 capsule 3   predniSONE (STERAPRED UNI-PAK 21 TAB) 10 MG (21) TBPK tablet Standard taper over 6 days. 21 tablet 0   No facility-administered medications prior to visit.    Review of Systems  Constitutional:  Negative for chills, fever, malaise/fatigue and weight loss.  HENT:  Negative for hearing loss, sore throat and tinnitus.   Eyes:  Negative for blurred vision and double vision.  Respiratory:  Negative for cough, hemoptysis, sputum production, shortness of breath, wheezing and stridor.   Cardiovascular:  Negative for chest pain, palpitations, orthopnea, leg swelling and PND.  Gastrointestinal:  Negative for abdominal pain, constipation, diarrhea, heartburn, nausea and vomiting.  Genitourinary:  Negative for dysuria, hematuria and urgency.  Musculoskeletal:  Negative for joint pain and myalgias.  Skin:  Negative for itching and rash.  Neurological:  Negative for dizziness, tingling, weakness and headaches.  Endo/Heme/Allergies:  Negative for environmental allergies. Does not bruise/bleed easily.  Psychiatric/Behavioral:  Negative for depression. The patient is not nervous/anxious and does not  have insomnia.   All other systems reviewed and are negative.   Objective:  Physical Exam Vitals reviewed.  Constitutional:      General: She is not in acute distress.    Appearance: She is well-developed.  HENT:     Head: Normocephalic and atraumatic.  Eyes:     General: No scleral icterus.    Conjunctiva/sclera: Conjunctivae normal.     Pupils: Pupils are equal, round, and reactive to light.  Neck:     Vascular: No JVD.     Trachea: No tracheal deviation.  Cardiovascular:     Rate  and Rhythm: Normal rate and regular rhythm.     Heart sounds: Normal heart sounds. No murmur heard. Pulmonary:     Effort: Pulmonary effort is normal. No tachypnea, accessory muscle usage or respiratory distress.     Breath sounds: No stridor. No wheezing, rhonchi or rales.  Abdominal:     General: There is no distension.     Palpations: Abdomen is soft.     Tenderness: There is no abdominal tenderness.  Musculoskeletal:        General: No tenderness.     Cervical back: Neck supple.  Lymphadenopathy:     Cervical: No cervical adenopathy.  Skin:    General: Skin is warm and dry.     Capillary Refill: Capillary refill takes less than 2 seconds.     Findings: No rash.  Neurological:     Mental Status: She is alert and oriented to person, place, and time.  Psychiatric:        Behavior: Behavior normal.     Vitals:   02/02/21 0936  BP: 118/68  Pulse: 81  Temp: 97.7 F (36.5 C)  TempSrc: Oral  SpO2: 98%  Weight: 120 lb 12.8 oz (54.8 kg)  Height: 5\' 1"  (1.549 m)   98% on RA BMI Readings from Last 3 Encounters:  02/02/21 22.82 kg/m  12/26/20 21.95 kg/m  12/02/20 23.39 kg/m   Wt Readings from Last 3 Encounters:  02/02/21 120 lb 12.8 oz (54.8 kg)  12/26/20 120 lb (54.4 kg)  12/02/20 123 lb 12.8 oz (56.2 kg)     CBC    Component Value Date/Time   WBC 7.9 12/02/2020 1123   RBC 4.84 12/02/2020 1123   HGB 14.0 12/02/2020 1123   HCT 43.5 12/02/2020 1123   PLT 243.0  12/02/2020 1123   MCV 89.7 12/02/2020 1123   MCH 25.7 (L) 10/12/2019 0508   MCHC 32.3 12/02/2020 1123   RDW 14.3 12/02/2020 1123   LYMPHSABS 1.6 12/02/2020 1123   MONOABS 0.7 12/02/2020 1123   EOSABS 1.9 (H) 12/02/2020 1123   BASOSABS 0.1 12/02/2020 1123     Chest Imaging: Nuclear medicine pet imaging11/29/2022: Patient has significant hypermetabolic adenopathy within the chest all within the mediastinum and bilateral hilum concerning for granulomatous disease versus sarcoidosis. The patient's images have been independently reviewed by me.    Pulmonary Functions Testing Results: No flowsheet data found.  FeNO:   Pathology:   Echocardiogram:   Heart Catheterization:     Assessment & Plan:     ICD-10-CM   1. Mediastinal adenopathy  R59.0     2. Hilar adenopathy  R59.0     3. 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  Now presents with abnormal mediastinal and hilar adenopathy with hypermetabolic uptake within the mediastinum.  This presentation and imaging would potentially be consistent with sarcoidosis.  Plan: Today in the office we discussed the risk benefits and alternatives of proceeding with video bronchoscopy with endobronchial ultrasound and transbronchial needle aspirations. Patient is agreeable to proceed. We look into the future for possible dates for bronchoscopy especially regarding the holiday season coming up.  She would like to have this done after Christmas and after her grandchildren visit if possible. Therefore we will plan for bronchoscopy on 02/27/2021. Hopefully we will be able to obtain diagnosis minimally invasive to avoid need for mediastinoscopy. Return to clinic approximately 1 week  after bronchoscopy to review results.    Current Outpatient Medications:    acetaminophen (TYLENOL) 325 MG tablet, Take 650 mg by mouth  every 6 (six) hours as needed for mild pain. , Disp: , Rfl:    ALPRAZolam (XANAX) 0.25 MG tablet, Take 0.5-2 tablets (0.125-0.5 mg total) by mouth 2 (two) times daily as needed for anxiety or sleep., Disp: 20 tablet, Rfl: 0   amoxicillin-clavulanate (AUGMENTIN) 875-125 MG tablet, Take 1 tablet by mouth 2 (two) times daily., Disp: 20 tablet, Rfl: 0   benzonatate (TESSALON) 100 MG capsule, Take 1 capsule (100 mg total) by mouth 3 (three) times daily as needed for cough., Disp: 30 capsule, Rfl: 0   budesonide-formoterol (SYMBICORT) 160-4.5 MCG/ACT inhaler, INHALE 2 PUFFS INTO THE LUNGS IN THE MORNING AND AT BEDTIME, Disp: 10.2 g, Rfl: 5   esomeprazole (NEXIUM) 40 MG capsule, Take 1 capsule (40 mg total) by mouth daily before breakfast., Disp: 90 capsule, Rfl: 3   predniSONE (STERAPRED UNI-PAK 21 TAB) 10 MG (21) TBPK tablet, Standard taper over 6 days., Disp: 21 tablet, Rfl: 0  I spent 62 minutes dedicated to the care of this patient on the date of this encounter to include pre-visit review of records, face-to-face time with the patient discussing conditions above, post visit ordering of testing, clinical documentation with the electronic health record, making appropriate referrals as documented, and communicating necessary findings to members of the patients care team.   Garner Nash, DO [facility] 02/02/2021 9:56 AM

## 2021-02-02 NOTE — Patient Instructions (Addendum)
Thank you for visiting Dr. Valeta Harms at [facility]. Today we recommend the following:  Orders Placed This Encounter  Procedures   Procedural/ Surgical Case Request: [place]   Ambulatory referral to Pulmonology   Bronchoscopy on 02/27/2021  Return in about 1 month (around 03/06/2021) for W/ Dr. Valeta Harms.    Please do your part to reduce the spread of COVID-19.

## 2021-02-02 NOTE — H&P (View-Only) (Signed)
Synopsis: Referred in December 2022 for lung nodule by Mosie Lukes, MD  Subjective:   PATIENT ID: Danielle Fox GENDER: female DOB: 10/20/1950, MRN: [id number]  Chief Complaint  Patient presents with   Consult    Patient says she had a CT and they saw some nodules.     This is a 70 year old female, past medical history of COPD, prior positive PPD.Present for evaluation of lung nodule.  Patient was enrolled in lung cancer screening program had an abnormal lung cancer screening CT on 01/12/2021.  Had mild associated mediastinal adenopathy she had multiple small but hypermetabolic mediastinal and hilar nodes.  Additionally a small right supraclavicular cervical and left axillary node.  Other small bilateral multiple pulmonary nodules.  Concerning for possible underlying granulomatous disease.  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 Felecia Shelling a few years prior.  She has subsequently recovered from this.  She also had a recent URI and was treated with antibiotics and steroids.  This is getting better.   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   Preventative health care 06/29/2016   Pruritus 06/29/2016   Pyloric stenosis in adult 05/2019   Welcome to Medicare preventive visit 06/29/2016     Family History  Problem Relation Age of Onset   Heart disease Mother        cabg, MI   Alcohol abuse Father    Cirrhosis Father    Heart disease Father        MI   Colon cancer Neg Hx    Colon polyps Neg Hx    Esophageal cancer Neg Hx    Stomach cancer Neg Hx    Rectal cancer Neg Hx      Past Surgical History:  Procedure Laterality Date   ABDOMINAL HYSTERECTOMY     APPENDECTOMY     COLONOSCOPY     01-14-2005   SINUS  IRRIGATION  04/02/2015   UPPER GASTROINTESTINAL ENDOSCOPY  11/22/2018    Social History   Socioeconomic History   Marital status: Married    Spouse name: Not on file   Number of children: 3   Years of education: Not on file   Highest education level: Not on file  Occupational History   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[employer], retired    Fish farm manager: [employer]  Tobacco Use   Smoking status: Former    Packs/day: 1.00    Years: 48.00    Pack years: 48.00    Types: Cigarettes    Quit date: 02/19/2014    Years since quitting: 6.9   Smokeless tobacco: Never   Tobacco comments:    Quit and has no desire to smoke again,  Vaping Use   Vaping Use: Never used  Substance and Sexual Activity   Alcohol use: Yes    Alcohol/week: 0.0 standard drinks    Comment: socially   Drug use: No   Sexual activity: Yes    Partners: Male  Other Topics Concern   Not on file  Social History Narrative   Exercise-- walks dog on occasion   Right Handed   One Story Home   Does not drink caffeine    Social Determinants of Radio broadcast assistant  Strain: Low Risk    Difficulty of Paying Living Expenses: Not hard at all  Food Insecurity: No Food Insecurity   Worried About Charity fundraiser in the Last Year: Never true   Ran Out of Food in the Last Year: Never true  Transportation Needs: No Transportation Needs   Lack of Transportation (Medical): No   Lack of Transportation (Non-Medical): No  Physical Activity: Inactive   Days of Exercise per Week: 0 days   Minutes of Exercise per Session: 0 min  Stress: No Stress Concern Present   Feeling of Stress : Not at all  Social Connections: Not on file  Intimate Partner Violence: Not At Risk   Fear of Current or Ex-Partner: No   Emotionally Abused: No   Physically Abused: No   Sexually Abused: No     Allergies  Allergen Reactions   Doxycycline Nausea And Vomiting   Vancomycin     Nausea and abdominal pain   Erythromycin Base Nausea Only      Outpatient Medications Prior to Visit  Medication Sig Dispense Refill   acetaminophen (TYLENOL) 325 MG tablet Take 650 mg by mouth every 6 (six) hours as needed for mild pain.      ALPRAZolam (XANAX) 0.25 MG tablet Take 0.5-2 tablets (0.125-0.5 mg total) by mouth 2 (two) times daily as needed for anxiety or sleep. 20 tablet 0   amoxicillin-clavulanate (AUGMENTIN) 875-125 MG tablet Take 1 tablet by mouth 2 (two) times daily. 20 tablet 0   benzonatate (TESSALON) 100 MG capsule Take 1 capsule (100 mg total) by mouth 3 (three) times daily as needed for cough. 30 capsule 0   budesonide-formoterol (SYMBICORT) 160-4.5 MCG/ACT inhaler INHALE 2 PUFFS INTO THE LUNGS IN THE MORNING AND AT BEDTIME 10.2 g 5   esomeprazole (NEXIUM) 40 MG capsule Take 1 capsule (40 mg total) by mouth daily before breakfast. 90 capsule 3   predniSONE (STERAPRED UNI-PAK 21 TAB) 10 MG (21) TBPK tablet Standard taper over 6 days. 21 tablet 0   No facility-administered medications prior to visit.    Review of Systems  Constitutional:  Negative for chills, fever, malaise/fatigue and weight loss.  HENT:  Negative for hearing loss, sore throat and tinnitus.   Eyes:  Negative for blurred vision and double vision.  Respiratory:  Negative for cough, hemoptysis, sputum production, shortness of breath, wheezing and stridor.   Cardiovascular:  Negative for chest pain, palpitations, orthopnea, leg swelling and PND.  Gastrointestinal:  Negative for abdominal pain, constipation, diarrhea, heartburn, nausea and vomiting.  Genitourinary:  Negative for dysuria, hematuria and urgency.  Musculoskeletal:  Negative for joint pain and myalgias.  Skin:  Negative for itching and rash.  Neurological:  Negative for dizziness, tingling, weakness and headaches.  Endo/Heme/Allergies:  Negative for environmental allergies. Does not bruise/bleed easily.  Psychiatric/Behavioral:  Negative for depression. The patient is not nervous/anxious and does not  have insomnia.   All other systems reviewed and are negative.   Objective:  Physical Exam Vitals reviewed.  Constitutional:      General: She is not in acute distress.    Appearance: She is well-developed.  HENT:     Head: Normocephalic and atraumatic.  Eyes:     General: No scleral icterus.    Conjunctiva/sclera: Conjunctivae normal.     Pupils: Pupils are equal, round, and reactive to light.  Neck:     Vascular: No JVD.     Trachea: No tracheal deviation.  Cardiovascular:     Rate  and Rhythm: Normal rate and regular rhythm.     Heart sounds: Normal heart sounds. No murmur heard. Pulmonary:     Effort: Pulmonary effort is normal. No tachypnea, accessory muscle usage or respiratory distress.     Breath sounds: No stridor. No wheezing, rhonchi or rales.  Abdominal:     General: There is no distension.     Palpations: Abdomen is soft.     Tenderness: There is no abdominal tenderness.  Musculoskeletal:        General: No tenderness.     Cervical back: Neck supple.  Lymphadenopathy:     Cervical: No cervical adenopathy.  Skin:    General: Skin is warm and dry.     Capillary Refill: Capillary refill takes less than 2 seconds.     Findings: No rash.  Neurological:     Mental Status: She is alert and oriented to person, place, and time.  Psychiatric:        Behavior: Behavior normal.     Vitals:   02/02/21 0936  BP: 118/68  Pulse: 81  Temp: 97.7 F (36.5 C)  TempSrc: Oral  SpO2: 98%  Weight: 120 lb 12.8 oz (54.8 kg)  Height: 5\' 1"  (1.549 m)   98% on RA BMI Readings from Last 3 Encounters:  02/02/21 22.82 kg/m  12/26/20 21.95 kg/m  12/02/20 23.39 kg/m   Wt Readings from Last 3 Encounters:  02/02/21 120 lb 12.8 oz (54.8 kg)  12/26/20 120 lb (54.4 kg)  12/02/20 123 lb 12.8 oz (56.2 kg)     CBC    Component Value Date/Time   WBC 7.9 12/02/2020 1123   RBC 4.84 12/02/2020 1123   HGB 14.0 12/02/2020 1123   HCT 43.5 12/02/2020 1123   PLT 243.0  12/02/2020 1123   MCV 89.7 12/02/2020 1123   MCH 25.7 (L) 10/12/2019 0508   MCHC 32.3 12/02/2020 1123   RDW 14.3 12/02/2020 1123   LYMPHSABS 1.6 12/02/2020 1123   MONOABS 0.7 12/02/2020 1123   EOSABS 1.9 (H) 12/02/2020 1123   BASOSABS 0.1 12/02/2020 1123     Chest Imaging: Nuclear medicine pet imaging11/29/2022: Patient has significant hypermetabolic adenopathy within the chest all within the mediastinum and bilateral hilum concerning for granulomatous disease versus sarcoidosis. The patient's images have been independently reviewed by me.    Pulmonary Functions Testing Results: No flowsheet data found.  FeNO:   Pathology:   Echocardiogram:   Heart Catheterization:     Assessment & Plan:     ICD-10-CM   1. Mediastinal adenopathy  R59.0     2. Hilar adenopathy  R59.0     3. 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  Now presents with abnormal mediastinal and hilar adenopathy with hypermetabolic uptake within the mediastinum.  This presentation and imaging would potentially be consistent with sarcoidosis.  Plan: Today in the office we discussed the risk benefits and alternatives of proceeding with video bronchoscopy with endobronchial ultrasound and transbronchial needle aspirations. Patient is agreeable to proceed. We look into the future for possible dates for bronchoscopy especially regarding the holiday season coming up.  She would like to have this done after Christmas and after her grandchildren visit if possible. Therefore we will plan for bronchoscopy on 02/27/2021. Hopefully we will be able to obtain diagnosis minimally invasive to avoid need for mediastinoscopy. Return to clinic approximately 1 week  after bronchoscopy to review results.    Current Outpatient Medications:    acetaminophen (TYLENOL) 325 MG tablet, Take 650 mg by mouth  every 6 (six) hours as needed for mild pain. , Disp: , Rfl:    ALPRAZolam (XANAX) 0.25 MG tablet, Take 0.5-2 tablets (0.125-0.5 mg total) by mouth 2 (two) times daily as needed for anxiety or sleep., Disp: 20 tablet, Rfl: 0   amoxicillin-clavulanate (AUGMENTIN) 875-125 MG tablet, Take 1 tablet by mouth 2 (two) times daily., Disp: 20 tablet, Rfl: 0   benzonatate (TESSALON) 100 MG capsule, Take 1 capsule (100 mg total) by mouth 3 (three) times daily as needed for cough., Disp: 30 capsule, Rfl: 0   budesonide-formoterol (SYMBICORT) 160-4.5 MCG/ACT inhaler, INHALE 2 PUFFS INTO THE LUNGS IN THE MORNING AND AT BEDTIME, Disp: 10.2 g, Rfl: 5   esomeprazole (NEXIUM) 40 MG capsule, Take 1 capsule (40 mg total) by mouth daily before breakfast., Disp: 90 capsule, Rfl: 3   predniSONE (STERAPRED UNI-PAK 21 TAB) 10 MG (21) TBPK tablet, Standard taper over 6 days., Disp: 21 tablet, Rfl: 0  I spent 62 minutes dedicated to the care of this patient on the date of this encounter to include pre-visit review of records, face-to-face time with the patient discussing conditions above, post visit ordering of testing, clinical documentation with the electronic health record, making appropriate referrals as documented, and communicating necessary findings to members of the patients care team.   Garner Nash, DO [facility] 02/02/2021 9:56 AM

## 2021-02-02 NOTE — Telephone Encounter (Signed)
I scheduled EBUS for 12/30 at 7:30.  Pt will go for covid test on 12/27.  I spoke to pt and gave her appt info.

## 2021-02-24 ENCOUNTER — Other Ambulatory Visit: Payer: Self-pay | Admitting: Pulmonary Disease

## 2021-02-25 ENCOUNTER — Other Ambulatory Visit: Payer: Self-pay

## 2021-02-25 ENCOUNTER — Encounter (HOSPITAL_COMMUNITY): Payer: Self-pay | Admitting: Pulmonary Disease

## 2021-02-25 LAB — SARS CORONAVIRUS 2 (TAT 6-24 HRS): SARS Coronavirus 2: NEGATIVE

## 2021-02-25 NOTE — Progress Notes (Signed)
PCP - Dr. Azalee Course  Cardiologist - Denies  EP- Denies  Endocrine- Denies  Pulm- Denies  Chest x-ray - 12/26/20 (E)  EKG - Denies  Stress Test - Denies  ECHO - 09/27/19 (E)  Cardiac Cath - Denies  AICD-na PM- na LOOP- na  Dialysis- Denies  Sleep Study - Denies CPAP - Denies  LABS- 02/24/21: COVID- Neg 02/27/21: CBC  ASA- Denies  ERAS-No  HA1C- Denies  Anesthesia- No  Pt denies having chest pain, sob, or fever during the pre-op phone call. All instructions explained to the pt, with a verbal understanding of the material including: As of today, stop taking all Aspirin (unless instructed by your doctor) and Other Aspirin containing products, Vitamins, Fish oils, and Herbal medications. Also stop all NSAIDS i.e. Advil, Ibuprofen, Motrin, Aleve, Anaprox, Naproxen, BC, Goody Powders, and all Supplements. Pt also instructed to wear a mask and social distance after being tested for COVID-19. The opportunity to ask questions was provided.    Coronavirus Screening  Have you experienced the following symptoms:  Cough yes/no: No Fever (>100.84F)  yes/no: No Runny nose yes/no: No Sore throat yes/no: No Difficulty breathing/shortness of breath  yes/no: No  Have you or a family member traveled in the last 14 days and where? yes/no: No   If the patient indicates "YES" to the above questions, their PAT will be rescheduled to limit the exposure to others and, the surgeon will be notified. THE PATIENT WILL NEED TO BE ASYMPTOMATIC FOR 14 DAYS.   If the patient is not experiencing any of these symptoms, the PAT nurse will instruct them to NOT bring anyone with them to their appointment since they may have these symptoms or traveled as well.   Please remind your patients and families that hospital visitation restrictions are in effect and the importance of the restrictions.

## 2021-02-27 ENCOUNTER — Other Ambulatory Visit: Payer: Self-pay

## 2021-02-27 ENCOUNTER — Ambulatory Visit (HOSPITAL_COMMUNITY)
Admission: RE | Admit: 2021-02-27 | Discharge: 2021-02-27 | Disposition: A | Discharge status: Home / Self Care | Payer: Medicare Other | Attending: Pulmonary Disease | Admitting: Pulmonary Disease

## 2021-02-27 ENCOUNTER — Ambulatory Visit (HOSPITAL_COMMUNITY): Payer: Medicare Other | Admitting: Certified Registered Nurse Anesthetist

## 2021-02-27 ENCOUNTER — Encounter (HOSPITAL_COMMUNITY): Payer: Self-pay | Admitting: Pulmonary Disease

## 2021-02-27 ENCOUNTER — Encounter (HOSPITAL_COMMUNITY)
Admission: RE | Disposition: A | Discharge status: Home / Self Care | Payer: Self-pay | Source: Home / Self Care | Attending: Pulmonary Disease

## 2021-02-27 DIAGNOSIS — K219 Gastro-esophageal reflux disease without esophagitis: Secondary | ICD-10-CM | POA: Diagnosis not present

## 2021-02-27 DIAGNOSIS — Z87891 Personal history of nicotine dependence: Secondary | ICD-10-CM | POA: Diagnosis not present

## 2021-02-27 DIAGNOSIS — I739 Peripheral vascular disease, unspecified: Secondary | ICD-10-CM | POA: Diagnosis not present

## 2021-02-27 DIAGNOSIS — R918 Other nonspecific abnormal finding of lung field: Secondary | ICD-10-CM | POA: Diagnosis not present

## 2021-02-27 DIAGNOSIS — R590 Localized enlarged lymph nodes: Secondary | ICD-10-CM | POA: Diagnosis not present

## 2021-02-27 DIAGNOSIS — J449 Chronic obstructive pulmonary disease, unspecified: Secondary | ICD-10-CM | POA: Insufficient documentation

## 2021-02-27 DIAGNOSIS — E876 Hypokalemia: Secondary | ICD-10-CM | POA: Diagnosis not present

## 2021-02-27 DIAGNOSIS — R599 Enlarged lymph nodes, unspecified: Secondary | ICD-10-CM | POA: Diagnosis not present

## 2021-02-27 DIAGNOSIS — E785 Hyperlipidemia, unspecified: Secondary | ICD-10-CM | POA: Diagnosis not present

## 2021-02-27 HISTORY — PX: VIDEO BRONCHOSCOPY WITH ENDOBRONCHIAL ULTRASOUND: SHX6177

## 2021-02-27 HISTORY — PX: FINE NEEDLE ASPIRATION: SHX5430

## 2021-02-27 LAB — CBC
HCT: 43.4 % (ref 36.0–46.0)
Hemoglobin: 13.9 g/dL (ref 12.0–15.0)
MCH: 29.1 pg (ref 26.0–34.0)
MCHC: 32 g/dL (ref 30.0–36.0)
MCV: 91 fL (ref 80.0–100.0)
Platelets: 201 10*3/uL (ref 150–400)
RBC: 4.77 MIL/uL (ref 3.87–5.11)
RDW: 14.5 % (ref 11.5–15.5)
WBC: 12.9 10*3/uL — ABNORMAL HIGH (ref 4.0–10.5)
nRBC: 0 % (ref 0.0–0.2)

## 2021-02-27 SURGERY — BRONCHOSCOPY, WITH EBUS
Anesthesia: General | Laterality: Bilateral

## 2021-02-27 MED ORDER — DEXAMETHASONE SODIUM PHOSPHATE 10 MG/ML IJ SOLN
INTRAMUSCULAR | Status: DC | PRN
  Administered 2021-02-27: 10 mg via INTRAVENOUS

## 2021-02-27 MED ORDER — AMISULPRIDE (ANTIEMETIC) 5 MG/2ML IV SOLN
10.0000 mg | Freq: Once | INTRAVENOUS | Status: DC | PRN
  Filled 2021-02-27: qty 4

## 2021-02-27 MED ORDER — CHLORHEXIDINE GLUCONATE 0.12 % MT SOLN: 15.0000 mL | Freq: Once | OROMUCOSAL | Status: AC

## 2021-02-27 MED ORDER — PHENYLEPHRINE HCL-NACL 20-0.9 MG/250ML-% IV SOLN
INTRAVENOUS | Status: DC | PRN
  Administered 2021-02-27: 25 ug/min via INTRAVENOUS

## 2021-02-27 MED ORDER — PROPOFOL 10 MG/ML IV BOLUS
INTRAVENOUS | Status: DC | PRN
  Administered 2021-02-27: 100 mg via INTRAVENOUS

## 2021-02-27 MED ORDER — FENTANYL CITRATE (PF) 250 MCG/5ML IJ SOLN
INTRAMUSCULAR | Status: DC | PRN
  Administered 2021-02-27 (×2): 50 ug via INTRAVENOUS

## 2021-02-27 MED ORDER — PHENYLEPHRINE 40 MCG/ML (10ML) SYRINGE FOR IV PUSH (FOR BLOOD PRESSURE SUPPORT)
PREFILLED_SYRINGE | INTRAVENOUS | Status: DC | PRN
  Administered 2021-02-27 (×2): 80 ug via INTRAVENOUS
  Administered 2021-02-27: 160 ug via INTRAVENOUS

## 2021-02-27 MED ORDER — ONDANSETRON HCL 4 MG/2ML IJ SOLN
INTRAMUSCULAR | Status: DC | PRN
  Administered 2021-02-27: 4 mg via INTRAVENOUS

## 2021-02-27 MED ORDER — ROCURONIUM BROMIDE 10 MG/ML (PF) SYRINGE
PREFILLED_SYRINGE | INTRAVENOUS | Status: DC | PRN
  Administered 2021-02-27: 50 mg via INTRAVENOUS

## 2021-02-27 MED ORDER — FENTANYL CITRATE (PF) 100 MCG/2ML IJ SOLN
INTRAMUSCULAR | Status: AC
  Filled 2021-02-27: qty 2

## 2021-02-27 MED ORDER — SUGAMMADEX SODIUM 200 MG/2ML IV SOLN
INTRAVENOUS | Status: DC | PRN
  Administered 2021-02-27: 200 mg via INTRAVENOUS

## 2021-02-27 MED ORDER — CHLORHEXIDINE GLUCONATE 0.12 % MT SOLN
OROMUCOSAL | Status: AC
  Administered 2021-02-27: 06:00:00 15 mL via OROMUCOSAL
  Filled 2021-02-27: qty 15

## 2021-02-27 MED ORDER — LIDOCAINE 2% (20 MG/ML) 5 ML SYRINGE
INTRAMUSCULAR | Status: DC | PRN
  Administered 2021-02-27: 30 mg via INTRAVENOUS

## 2021-02-27 MED ORDER — EPHEDRINE SULFATE 50 MG/ML IJ SOLN
INTRAMUSCULAR | Status: DC | PRN
  Administered 2021-02-27 (×2): 10 mg via INTRAVENOUS

## 2021-02-27 MED ORDER — LACTATED RINGERS IV SOLN: INTRAVENOUS | Status: DC

## 2021-02-27 MED ORDER — ONDANSETRON HCL 4 MG/2ML IJ SOLN: 4.0000 mg | Freq: Once | INTRAMUSCULAR | Status: DC | PRN

## 2021-02-27 SURGICAL SUPPLY — 30 items

## 2021-02-27 NOTE — Anesthesia Procedure Notes (Signed)
Procedure Name: Intubation Date/Time: 02/27/2021 7:31 AM Performed by: Lowella Dell, CRNA Pre-anesthesia Checklist: Patient identified, Emergency Drugs available, Suction available and Patient being monitored Patient Re-evaluated:Patient Re-evaluated prior to induction Oxygen Delivery Method: Circle System Utilized Preoxygenation: Pre-oxygenation with 100% oxygen Induction Type: IV induction Ventilation: Mask ventilation without difficulty Laryngoscope Size: Mac and 3 Tube type: Oral Tube size: 8.5 mm Number of attempts: 1 Airway Equipment and Method: Stylet Placement Confirmation: ETT inserted through vocal cords under direct vision, positive ETCO2 and breath sounds checked- equal and bilateral Secured at: 22 cm Tube secured with: Tape Dental Injury: Teeth and Oropharynx as per pre-operative assessment

## 2021-02-27 NOTE — Anesthesia Postprocedure Evaluation (Signed)
Anesthesia Post Note  Patient: Danielle Fox  Procedure(s) Performed: VIDEO BRONCHOSCOPY WITH ENDOBRONCHIAL ULTRASOUND (Bilateral) FINE NEEDLE ASPIRATION (FNA) LINEAR     Patient location during evaluation: PACU Anesthesia Type: General Level of consciousness: awake and alert Pain management: pain level controlled Vital Signs Assessment: post-procedure vital signs reviewed and stable Respiratory status: spontaneous breathing, nonlabored ventilation, respiratory function stable and patient connected to nasal cannula oxygen Cardiovascular status: blood pressure returned to baseline and stable Postop Assessment: no apparent nausea or vomiting Anesthetic complications: no   No notable events documented.  Last Vitals:  Vitals:   02/27/21 0845 02/27/21 0900  BP:  (!) 102/56  Pulse:  92  Resp:  (!) 25  Temp: 36.4 C   SpO2:  95%    Last Pain:  Vitals:   02/27/21 0900  TempSrc:   PainSc: 0-No pain                 Effie Berkshire

## 2021-02-27 NOTE — Transfer of Care (Signed)
Immediate Anesthesia Transfer of Care Note  Patient: Danielle Fox  Procedure(s) Performed: VIDEO BRONCHOSCOPY WITH ENDOBRONCHIAL ULTRASOUND (Bilateral) FINE NEEDLE ASPIRATION (FNA) LINEAR  Patient Location: PACU  Anesthesia Type:General  Level of Consciousness: awake and patient cooperative  Airway & Oxygen Therapy: Patient Spontanous Breathing and Patient connected to face mask oxygen  Post-op Assessment: Report given to RN and Post -op Vital signs reviewed and stable  Post vital signs: Reviewed and stable  Last Vitals:  Vitals Value Taken Time  BP 113/48 02/27/21 0844  Temp    Pulse 94 02/27/21 0844  Resp 17 02/27/21 0844  SpO2 100 % 02/27/21 0844  Vitals shown include unvalidated device data.  Last Pain:  Vitals:   02/27/21 0601  TempSrc:   PainSc: 0-No pain      Patients Stated Pain Goal: 2 (73/40/37 0964)  Complications: No notable events documented.

## 2021-02-27 NOTE — Discharge Instructions (Signed)
Flexible Bronchoscopy, Care After This sheet gives you information about how to care for yourself after your test. Your doctor may also give you more specific instructions. If you have problems or questions, contact your doctor. Follow these instructions at home: Eating and drinking Do not eat or drink anything (not even water) for 2 hours after your test, or until your numbing medicine (local anesthetic) wears off. When your numbness is gone and your cough and gag reflexes have come back, you may: Eat only soft foods. Slowly drink liquids. The day after the test, go back to your normal diet. Driving Do not drive for 24 hours if you were given a medicine to help you relax (sedative). Do not drive or use heavy machinery while taking prescription pain medicine. General instructions  Take over-the-counter and prescription medicines only as told by your doctor. Return to your normal activities as told. Ask what activities are safe for you. Do not use any products that have nicotine or tobacco in them. This includes cigarettes and e-cigarettes. If you need help quitting, ask your doctor. Keep all follow-up visits as told by your doctor. This is important. It is very important if you had a tissue sample (biopsy) taken. Get help right away if: You have shortness of breath that gets worse. You get light-headed. You feel like you are going to pass out (faint). You have chest pain. You cough up: More than a little blood. More blood than before. Summary Do not eat or drink anything (not even water) for 2 hours after your test, or until your numbing medicine wears off. Do not use cigarettes. Do not use e-cigarettes. Get help right away if you have chest pain.  This information is not intended to replace advice given to you by your health care provider. Make sure you discuss any questions you have with your health care provider. Document Released: 12/13/2008 Document Revised: 01/28/2017 Document  Reviewed: 03/05/2016 Elsevier Patient Education  2020 [employer].

## 2021-02-27 NOTE — Interval H&P Note (Signed)
History and Physical Interval Note:  02/27/2021 7:07 AM  Danielle Fox  has presented today for surgery, with the diagnosis of Adenopathy.  The various methods of treatment have been discussed with the patient and family. After consideration of risks, benefits and other options for treatment, the patient has consented to  Procedure(s): [place] (Bilateral) as a surgical intervention.  The patient's history has been reviewed, patient examined, no change in status, stable for surgery.  I have reviewed the patient's chart and labs.  Questions were answered to the patient's satisfaction.     [place]

## 2021-02-27 NOTE — Anesthesia Preprocedure Evaluation (Addendum)
Anesthesia Evaluation  Patient identified by MRN, date of birth, ID band Patient awake    Reviewed: Allergy & Precautions, NPO status , Patient's Chart, lab work & pertinent test results  Airway Mallampati: I  TM Distance: >3 FB Neck ROM: Full    Dental  (+) Edentulous Upper, Dental Advisory Given   Pulmonary COPD, former smoker,     + decreased breath sounds      Cardiovascular + Peripheral Vascular Disease   Rhythm:Regular Rate:Normal     Neuro/Psych negative neurological ROS  negative psych ROS   GI/Hepatic Neg liver ROS, GERD  Medicated,  Endo/Other  negative endocrine ROS  Renal/GU negative Renal ROS     Musculoskeletal negative musculoskeletal ROS (+)   Abdominal   Peds  Hematology   Anesthesia Other Findings   Reproductive/Obstetrics                            Anesthesia Physical Anesthesia Plan  ASA: 3  Anesthesia Plan: General   Post-op Pain Management:    Induction: Intravenous  PONV Risk Score and Plan: 3 and Ondansetron, Dexamethasone, Midazolam and Treatment may vary due to age or medical condition  Airway Management Planned: Oral ETT  Additional Equipment: None  Intra-op Plan:   Post-operative Plan: Extubation in OR  Informed Consent: I have reviewed the patients History and Physical, chart, labs and discussed the procedure including the risks, benefits and alternatives for the proposed anesthesia with the patient or authorized representative who has indicated his/her understanding and acceptance.       Plan Discussed with: CRNA  Anesthesia Plan Comments:       Anesthesia Quick Evaluation

## 2021-02-27 NOTE — Op Note (Signed)
Video Bronchoscopy with Endobronchial Ultrasound Procedure Note  Date of Operation: 02/27/2021  Pre-op Diagnosis: Mediastinal adenopathy   Post-op Diagnosis: Mediastinal adenopathy   Surgeon: Garner Nash, DO  Assistants: None   Anesthesia: General endotracheal anesthesia  Operation: Flexible video fiberoptic bronchoscopy with endobronchial ultrasound and biopsies.  Estimated Blood Loss: Minimal  Complications: None   Indications and History: Danielle Fox is a 70 y.o. female with mediastinal adenopathy.  The risks, benefits, complications, treatment options and expected outcomes were discussed with the patient.  The possibilities of pneumothorax, pneumonia, reaction to medication, pulmonary aspiration, perforation of a viscus, bleeding, failure to diagnose a condition and creating a complication requiring transfusion or operation were discussed with the patient who freely signed the consent.    Description of Procedure: The patient was examined in the preoperative area and history and data from the preprocedure consultation were reviewed. It was deemed appropriate to proceed.  The patient was taken to [facility] endoscopy room 3, identified as [place] and the procedure verified as Flexible Video Fiberoptic Bronchoscopy.  A Time Out was held and the above information confirmed. After being taken to the operating room general anesthesia was initiated and the patient  was orally intubated. The video fiberoptic bronchoscope was introduced via the endotracheal tube and a general inspection was performed which showed normal right and left lung anatomy no evidence of endobronchial lesion. The standard scope was then withdrawn and the endobronchial ultrasound was used to identify and characterize the peritracheal, hilar and bronchial lymph nodes. Inspection showed enlarged subcarinal and paratracheal adenopathy. Using real-time ultrasound guidance transbronchial needle biopsies were take  from Station 7 nodes and were sent for cytology. The patient tolerated the procedure well without apparent complications. There was no significant blood loss. The bronchoscope was withdrawn. Anesthesia was reversed and the patient was taken to the PACU for recovery.   Samples: 1. Wang needle biopsies from Station 7 node  Plans:  The patient will be discharged from the PACU to home when recovered from anesthesia. We will review the cytology, pathology results with the patient when they become available. Outpatient followup will be with Garner Nash, DO.   Garner Nash, DO [facility] 02/27/2021 8:40 AM

## 2021-03-02 ENCOUNTER — Encounter (HOSPITAL_COMMUNITY): Payer: Self-pay | Admitting: Pulmonary Disease

## 2021-03-07 DIAGNOSIS — C3490 Malignant neoplasm of unspecified part of unspecified bronchus or lung: Secondary | ICD-10-CM | POA: Diagnosis not present

## 2021-03-10 LAB — CYTOLOGY - NON PAP

## 2021-03-13 ENCOUNTER — Encounter: Payer: Self-pay | Admitting: Pulmonary Disease

## 2021-03-13 ENCOUNTER — Other Ambulatory Visit: Payer: Self-pay

## 2021-03-13 ENCOUNTER — Ambulatory Visit: Payer: Medicare Other | Admitting: Pulmonary Disease

## 2021-03-13 VITALS — BP 100/56 | HR 93 | Temp 97.8°F | Ht 61.0 in | Wt 121.4 lb

## 2021-03-13 DIAGNOSIS — R948 Abnormal results of function studies of other organs and systems: Secondary | ICD-10-CM | POA: Diagnosis not present

## 2021-03-13 DIAGNOSIS — Z87891 Personal history of nicotine dependence: Secondary | ICD-10-CM

## 2021-03-13 DIAGNOSIS — R590 Localized enlarged lymph nodes: Secondary | ICD-10-CM

## 2021-03-13 DIAGNOSIS — R599 Enlarged lymph nodes, unspecified: Secondary | ICD-10-CM

## 2021-03-13 NOTE — Patient Instructions (Signed)
Thank you for visiting Dr. Valeta Harms at [facility]. Today we recommend the following:  Orders Placed This Encounter  Procedures   CT Super D Chest Wo Contrast   Return in about 2 months (around 05/11/2021) for w/ Dr. Valeta Harms .    Please do your part to reduce the spread of COVID-19.

## 2021-03-13 NOTE — Progress Notes (Signed)
Synopsis: Referred in December 2022 for lung nodule by Mosie Lukes, MD  Subjective:   PATIENT ID: Danielle Fox GENDER: female DOB: 07-26-1950, MRN: [id number]  Chief Complaint  Patient presents with   Follow-up    Follow up. Patient says everything is going well.      This is a 71 year old female, past medical history of COPD, prior positive PPD.Present for evaluation of lung nodule.  Patient was enrolled in lung cancer screening program had an abnormal lung cancer screening CT on 01/12/2021.  Had mild associated mediastinal adenopathy she had multiple small but hypermetabolic mediastinal and hilar nodes.  Additionally a small right supraclavicular cervical and left axillary node.  Other small bilateral multiple pulmonary nodules.  Concerning for possible underlying granulomatous disease.  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 Felecia Shelling a few years prior.  She has subsequently recovered from this.  She also had a recent URI and was treated with antibiotics and steroids.  This is getting better.  OV 03/13/2021: Here today for follow-up after bronchoscopy.  Patient was taken for bronchoscopy following nuclear medicine pet imaging which revealed hypermetabolic adenopathy within the chest.  Tissue sampling of the lymph nodes were all negative.  Flow cytometry also revealed no polyclonal or monoclonal proliferations of lymphocytes.  No evidence of lymphoma.  There was also no granulomatous disease noted.  We reviewed her pathology results today in the office which all suggest a reactive adenopathy.   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   Preventative health care 06/29/2016   Pruritus 06/29/2016   Pyloric stenosis in adult  05/2019   Welcome to Medicare preventive visit 06/29/2016     Family History  Problem Relation Age of Onset   Heart disease Mother        cabg, MI   Alcohol abuse Father    Cirrhosis Father    Heart disease Father        MI   Colon cancer Neg Hx    Colon polyps Neg Hx    Esophageal cancer Neg Hx    Stomach cancer Neg Hx    Rectal cancer Neg Hx      Past Surgical History:  Procedure Laterality Date   ABDOMINAL HYSTERECTOMY     APPENDECTOMY     COLONOSCOPY     01-14-2005   DILATION AND CURETTAGE OF UTERUS     FINE NEEDLE ASPIRATION  02/27/2021   Procedure: FINE NEEDLE ASPIRATION (FNA) LINEAR;  Surgeon: Garner Nash, DO;  Location: [facility];  Service: Pulmonary;;   SINUS IRRIGATION  04/02/2015   TUBAL LIGATION     UPPER GASTROINTESTINAL ENDOSCOPY  11/22/2018   VIDEO BRONCHOSCOPY WITH ENDOBRONCHIAL ULTRASOUND Bilateral 02/27/2021   Procedure: VIDEO BRONCHOSCOPY WITH ENDOBRONCHIAL ULTRASOUND;  Surgeon: Garner Nash, DO;  Location: [facility];  Service: Pulmonary;  Laterality: Bilateral;    Social History   Socioeconomic History   Marital status: Married    Spouse name: Not on file   Number of children: 3   Years of education: Not on file   Highest education level: Not on file  Occupational History   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[employer], retired    Fish farm manager: [employer]  Use   Smoking status: Former    Packs/day: 1.00    Years: 48.00    Pack years: 48.00    Types: Cigarettes    Quit date: 02/19/2014    Years since quitting: 7.0   Smokeless tobacco: Never   Tobacco comments:    Quit and has no desire to smoke again,  Vaping Use   Vaping Use: Never used  Substance and Sexual Activity   Alcohol use: Yes    Alcohol/week: 0.0 standard drinks    Comment: socially   Drug use: No   Sexual activity: Yes    Partners: Male  Other Topics Concern   Not on file  Social History Narrative   Exercise-- walks dog on occasion   Right Handed   One Story Home    Does not drink caffeine    Social Determinants of Health   Financial Resource Strain: Low Risk    Difficulty of Paying Living Expenses: Not hard at all  Food Insecurity: No Food Insecurity   Worried About Charity fundraiser in the Last Year: Never true   [place] in the Last Year: Never true  Transportation Needs: No Transportation Needs   Lack of Transportation (Medical): No   Lack of Transportation (Non-Medical): No  Physical Activity: Inactive   Days of Exercise per Week: 0 days   Minutes of Exercise per Session: 0 min  Stress: No Stress Concern Present   Feeling of Stress : Not at all  Social Connections: Not on file  Intimate Partner Violence: Not At Risk   Fear of Current or Ex-Partner: No   Emotionally Abused: No   Physically Abused: No   Sexually Abused: No     Allergies  Allergen Reactions   Doxycycline Nausea And Vomiting   Vancomycin     Nausea and abdominal pain   Erythromycin Base Nausea Only     Outpatient Medications Prior to Visit  Medication Sig Dispense Refill   acetaminophen (TYLENOL) 325 MG tablet Take 650 mg by mouth every 6 (six) hours as needed for mild pain.      esomeprazole (NEXIUM) 40 MG capsule Take 1 capsule (40 mg total) by mouth daily before breakfast. 90 capsule 3   Multiple Vitamins-Minerals (MULTIVITAMIN WITH MINERALS) tablet Take 1 tablet by mouth daily.     ALPRAZolam (XANAX) 0.25 MG tablet Take 0.5-2 tablets (0.125-0.5 mg total) by mouth 2 (two) times daily as needed for anxiety or sleep. (Patient not taking: Reported on 02/18/2021) 20 tablet 0   amoxicillin-clavulanate (AUGMENTIN) 875-125 MG tablet Take 1 tablet by mouth 2 (two) times daily. (Patient not taking: Reported on 02/18/2021) 20 tablet 0   benzonatate (TESSALON) 100 MG capsule Take 1 capsule (100 mg total) by mouth 3 (three) times daily as needed for cough. (Patient not taking: Reported on 02/18/2021) 30 capsule 0   budesonide-formoterol (SYMBICORT) 160-4.5 MCG/ACT  inhaler INHALE 2 PUFFS INTO THE LUNGS IN THE MORNING AND AT BEDTIME (Patient taking differently: Inhale 2 puffs into the lungs 2 (two) times daily as needed (asthma).) 10.2 g 5   No facility-administered medications prior to visit.    Review of Systems  Constitutional:  Negative for chills, fever, malaise/fatigue and weight loss.  HENT:  Negative for hearing loss, sore throat and tinnitus.   Eyes:  Negative for blurred vision and double vision.  Respiratory:  Negative for cough, hemoptysis, sputum production, shortness of breath, wheezing and stridor.   Cardiovascular:  Negative for chest pain, palpitations,  orthopnea, leg swelling and PND.  Gastrointestinal:  Negative for abdominal pain, constipation, diarrhea, heartburn, nausea and vomiting.  Genitourinary:  Negative for dysuria, hematuria and urgency.  Musculoskeletal:  Negative for joint pain and myalgias.  Skin:  Negative for itching and rash.  Neurological:  Negative for dizziness, tingling, weakness and headaches.  Endo/Heme/Allergies:  Negative for environmental allergies. Does not bruise/bleed easily.  Psychiatric/Behavioral:  Negative for depression. The patient is not nervous/anxious and does not have insomnia.   All other systems reviewed and are negative.   Objective:  Physical Exam Vitals reviewed.  Constitutional:      General: She is not in acute distress.    Appearance: She is well-developed.  HENT:     Head: Normocephalic and atraumatic.  Eyes:     General: No scleral icterus.    Conjunctiva/sclera: Conjunctivae normal.     Pupils: Pupils are equal, round, and reactive to light.  Neck:     Vascular: No JVD.     Trachea: No tracheal deviation.  Cardiovascular:     Rate and Rhythm: Normal rate and regular rhythm.     Heart sounds: No murmur heard. Pulmonary:     Effort: Pulmonary effort is normal. No tachypnea, accessory muscle usage or respiratory distress.     Breath sounds: No stridor. No wheezing, rhonchi  or rales.  Abdominal:     General: Bowel sounds are normal. There is no distension.     Palpations: Abdomen is soft.     Tenderness: There is no abdominal tenderness.  Musculoskeletal:        General: No tenderness.     Cervical back: Neck supple.  Lymphadenopathy:     Cervical: No cervical adenopathy.  Skin:    General: Skin is warm and dry.     Capillary Refill: Capillary refill takes less than 2 seconds.     Findings: No rash.  Neurological:     Mental Status: She is alert and oriented to person, place, and time.  Psychiatric:        Behavior: Behavior normal.     Vitals:   03/13/21 1544  BP: (!) 100/56  Pulse: 93  Temp: 97.8 F (36.6 C)  TempSrc: Oral  SpO2: 97%  Weight: 121 lb 6.4 oz (55.1 kg)  Height: '5\' 1"'  (1.549 m)   97% on RA BMI Readings from Last 3 Encounters:  03/13/21 22.94 kg/m  02/27/21 22.30 kg/m  02/02/21 22.82 kg/m   Wt Readings from Last 3 Encounters:  03/13/21 121 lb 6.4 oz (55.1 kg)  02/27/21 118 lb (53.5 kg)  02/02/21 120 lb 12.8 oz (54.8 kg)     CBC    Component Value Date/Time   WBC 12.9 (H) 02/27/2021 0553   RBC 4.77 02/27/2021 0553   HGB 13.9 02/27/2021 0553   HCT 43.4 02/27/2021 0553   PLT 201 02/27/2021 0553   MCV 91.0 02/27/2021 0553   MCH 29.1 02/27/2021 0553   MCHC 32.0 02/27/2021 0553   RDW 14.5 02/27/2021 0553   LYMPHSABS 1.6 12/02/2020 1123   MONOABS 0.7 12/02/2020 1123   EOSABS 1.9 (H) 12/02/2020 1123   BASOSABS 0.1 12/02/2020 1123     Chest Imaging: Nuclear medicine pet imaging11/29/2022: Patient has significant hypermetabolic adenopathy within the chest all within the mediastinum and bilateral hilum concerning for granulomatous disease versus sarcoidosis. The patient's images have been independently reviewed by me.    Pulmonary Functions Testing Results: No flowsheet data found.  FeNO:   Pathology:   FINAL MICROSCOPIC  DIAGNOSIS:   A. LYMPH NODE, STATION 7, FINE NEEDLE ASPIRATION:  -  Limited  specimen with floridly reactive lymphoid tissue  -  No granulomas identified  -  No carcinoma identified   COMMENT:   Flow cytometry performed on the sample ([id number]) did not identify a  monoclonal B-cell or phenotypically aberrant T-cell population.  Immunohistochemistry was performed on the cellblock.  By  immunohistochemistry, CD20 and CD3 highlight an admixture of B and T  cells.  CD10 and BCL-6 highlight germinal centers which are negative for  Bcl-2.  Ki-67 shows increased proliferation within the germinal centers.  B cells do not coexpress CD5 or cyclin D1.  CD56 and Cytokeratin AE1/3  are negative.   Overall, there is no morphologic or immunophenotypic evidence of a  lymphoproliferative process or carcinoma in the examined material.  If  clinical concern persists for a lymphoproliferative process, excisional  biopsy is recommended.   Echocardiogram:   Heart Catheterization:     Assessment & Plan:     ICD-10-CM   1. Adenopathy  R59.9 CT Super D Chest Wo Contrast    2. Hilar adenopathy  R59.0     3. Mediastinal adenopathy  R59.0     4. Abnormal PET scan of mediastinum  R94.8     5. Personal history of tobacco use, presenting hazards to health  Z87.891     6. 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  Now patient presents with abnormal mediastinal and hilar adenopathy which is hypermetabolic.  Patient was taken for bronchoscopy with endobronchial ultrasound and transbronchial needle aspiration.  Tissue was sent for cytology evaluation as well as flow cytometry.  Both of which were unrevealing no evidence of granulomas or lymphoproliferative disease such as lymphoma.  Plan: We discussed the results today in the office with the patient. She has no other systemic symptoms at this time.  With negative pathology results we made a decision to consider short-term  image follow-up.  We will have repeat images in approximately 8 to 10 weeks. If there is any increase in the adenopathy of the chest I explained to her that I think she probably would benefit from mediastinoscopy and removal of one of the lymph nodes to exclude malignancy and hopefully establish a diagnosis. Patient is agreeable to this plan.   Current Outpatient Medications:    acetaminophen (TYLENOL) 325 MG tablet, Take 650 mg by mouth every 6 (six) hours as needed for mild pain. , Disp: , Rfl:    esomeprazole (NEXIUM) 40 MG capsule, Take 1 capsule (40 mg total) by mouth daily before breakfast., Disp: 90 capsule, Rfl: 3   Multiple Vitamins-Minerals (MULTIVITAMIN WITH MINERALS) tablet, Take 1 tablet by mouth daily., Disp: , Rfl:    ALPRAZolam (XANAX) 0.25 MG tablet, Take 0.5-2 tablets (0.125-0.5 mg total) by mouth 2 (two) times daily as needed for anxiety or sleep. (Patient not taking: Reported on 02/18/2021), Disp: 20 tablet, Rfl: 0   amoxicillin-clavulanate (AUGMENTIN) 875-125 MG tablet, Take 1 tablet by mouth 2 (two) times daily. (Patient not taking: Reported on 02/18/2021), Disp: 20 tablet, Rfl: 0   benzonatate (TESSALON) 100 MG capsule, Take 1 capsule (100 mg total) by mouth 3 (three) times daily as needed for cough. (Patient not taking: Reported on 02/18/2021), Disp: 30 capsule, Rfl: 0   budesonide-formoterol (SYMBICORT) 160-4.5 MCG/ACT inhaler, INHALE 2 PUFFS INTO THE LUNGS IN THE  MORNING AND AT BEDTIME (Patient taking differently: Inhale 2 puffs into the lungs 2 (two) times daily as needed (asthma).), Disp: 10.2 g, Rfl: 5    Garner Nash, DO [facility] 03/13/2021 4:20 PM

## 2021-03-27 LAB — SURGICAL PATHOLOGY

## 2021-04-20 ENCOUNTER — Other Ambulatory Visit: Payer: Self-pay

## 2021-04-20 ENCOUNTER — Ambulatory Visit
Admission: RE | Admit: 2021-04-20 | Discharge: 2021-04-20 | Disposition: A | Discharge status: Home / Self Care | Payer: Medicare Other | Source: Ambulatory Visit | Attending: Pulmonary Disease | Admitting: Pulmonary Disease

## 2021-04-20 DIAGNOSIS — R590 Localized enlarged lymph nodes: Secondary | ICD-10-CM | POA: Diagnosis not present

## 2021-04-20 DIAGNOSIS — R599 Enlarged lymph nodes, unspecified: Secondary | ICD-10-CM

## 2021-05-05 ENCOUNTER — Other Ambulatory Visit: Payer: Self-pay

## 2021-05-05 DIAGNOSIS — R9389 Abnormal findings on diagnostic imaging of other specified body structures: Secondary | ICD-10-CM

## 2021-05-05 NOTE — Progress Notes (Signed)
Danielle Fox,   Please let patient know that I have reviewed her CT. Nodes are still present. Other findings still in the chest concerning for sarcoid. She needs a repeat HRCT Chest in 3 months.   Please place CT order and tell patient.   Thanks,  BLI  Garner Nash, DO [facility] 05/05/2021 4:13 PM

## 2021-06-01 ENCOUNTER — Encounter: Payer: Self-pay | Admitting: Pulmonary Disease

## 2021-06-01 ENCOUNTER — Ambulatory Visit (INDEPENDENT_AMBULATORY_CARE_PROVIDER_SITE_OTHER): Payer: Medicare Other | Admitting: Pulmonary Disease

## 2021-06-01 VITALS — BP 110/64 | HR 72 | Temp 97.5°F | Ht 61.0 in | Wt 121.4 lb

## 2021-06-01 DIAGNOSIS — R599 Enlarged lymph nodes, unspecified: Secondary | ICD-10-CM

## 2021-06-01 DIAGNOSIS — R590 Localized enlarged lymph nodes: Secondary | ICD-10-CM

## 2021-06-01 NOTE — Progress Notes (Signed)
? ?Synopsis: Referred in December 2022 for lung nodule by Mosie Lukes, MD ? ?Subjective:  ? ?PATIENT ID: Danielle Fox GENDER: female DOB: 22-Sep-1950, MRN: [id number] ? ?Chief Complaint  ?Patient presents with  ? Follow-up  ?  Follow up. Patient has no complaints.   ? ? ? ?This is a 71 year old female, past medical history of COPD, prior positive PPD.Present for evaluation of lung nodule.  Patient was enrolled in lung cancer screening program had an abnormal lung cancer screening CT on 01/12/2021.  Had mild associated mediastinal adenopathy she had multiple small but hypermetabolic mediastinal and hilar nodes.  Additionally a small right supraclavicular cervical and left axillary node.  Other small bilateral multiple pulmonary nodules.  Concerning for possible underlying granulomatous disease.  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 Felecia Shelling a few years prior.  She has subsequently recovered from this.  She also had a recent URI and was treated with antibiotics and steroids.  This is getting better. ? ?OV 03/13/2021: Here today for follow-up after bronchoscopy.  Patient was taken for bronchoscopy following nuclear medicine pet imaging which revealed hypermetabolic adenopathy within the chest.  Tissue sampling of the lymph nodes were all negative.  Flow cytometry also revealed no polyclonal or monoclonal proliferations of lymphocytes.  No evidence of lymphoma.  There was also no granulomatous disease noted.  We reviewed her pathology results today in the office which all suggest a reactive adenopathy. ? ?OV 06/01/2021: Here today for follow-up after recent CT scan of the chest.  Thankfully lymphadenopathy is stable.  She otherwise feels great.  Leaving soon to go spend a couple of weeks and [place] with family.  From respiratory standpoint she is able to get around do everything that she wants to do.  She was happy to receive the news.  Also discussed importance of repeat  follow-up imaging with prominent adenopathy.  I do suspect she is probably dealing with sarcoidosis and we just did not make a diagnosis on last bronchoscopy.  As long as her lymph nodes are stable I think we can avoid repeat procedure versus even consideration for mediastinoscopy.  We talked about this today in the office. ? ? ?Past Medical History:  ?Diagnosis Date  ? Allergy   ? Clostridioides difficile infection 10/19/2018  ? tested 10/2018 negative  ? Colon polyps   ? Constipation 10/06/2014  ? COPD (chronic obstructive pulmonary disease) ([place])   ? Osteopenia   ? 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 Preventative health care 06/29/2016  ? Pruritus 06/29/2016  ? Pyloric stenosis in adult 05/2019  ? Welcome to Medicare preventive visit 06/29/2016  ?  ? ?Family History  ?Problem Relation Age of Onset  ? Heart disease Mother   ?     cabg, MI  ? Alcohol abuse Father   ? Cirrhosis Father   ? Heart disease Father   ?     MI  ? Colon cancer Neg Hx   ? Colon polyps Neg Hx   ? Esophageal cancer Neg Hx   ? Stomach cancer Neg Hx   ? Rectal cancer Neg Hx   ?  ? ?Past Surgical History:  ?Procedure Laterality Date  ? ABDOMINAL HYSTERECTOMY    ? APPENDECTOMY    ? COLONOSCOPY    ? 01-14-2005  ? DILATION AND CURETTAGE OF UTERUS    ? FINE NEEDLE ASPIRATION  02/27/2021  ? Procedure: FINE NEEDLE ASPIRATION (FNA) LINEAR;  Surgeon:  Garner Nash, DO;  Location: [facility];  Service: Pulmonary;;  ? SINUS IRRIGATION  04/02/2015  ? TUBAL LIGATION    ? UPPER GASTROINTESTINAL ENDOSCOPY  11/22/2018  ? VIDEO BRONCHOSCOPY WITH ENDOBRONCHIAL ULTRASOUND Bilateral 02/27/2021  ? Procedure: VIDEO BRONCHOSCOPY WITH ENDOBRONCHIAL ULTRASOUND;  Surgeon: Garner Nash, DO;  Location: [facility];  Service: Pulmonary;  Laterality: Bilateral;  ? ? ?Social History  ? ?Socioeconomic History  ? Marital status: Married  ?  Spouse name: Not on file  ? Number of children: 3  ? Years of education: Not on file  ? Highest education level: Not on file   ?Occupational History  ? 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  Employer: [employer]  ?Tobacco Use  ? Smoking status: Former  ?  Packs/day: 1.00  ?  Years: 48.00  ?  Pack years: 48.00  ?  Types: Cigarettes  ?  Quit date: 02/19/2014  ?  Years since quitting: 7.2  ? Smokeless tobacco: Never  ? Tobacco comments:  ?  Quit and has no desire to smoke again,  ?Vaping Use  ? Vaping Use: Never used  ?Substance and Sexual Activity  ? Alcohol use: Yes  ?  Alcohol/week: 0.0 standard drinks  ?  Comment: socially  ? Drug use: No  ? Sexual activity: Yes  ?  Partners: Male  ?Other Topics Concern  ? Not on file  ?Social History Narrative  ? Exercise-- walks dog on occasion  ? Right Handed  ? One Story Home  ? Does not drink caffeine   ? ?Social Determinants of Health  ? ?Financial Resource Strain: Low Risk   ? Difficulty of Paying Living Expenses: Not hard at all  ?Food Insecurity: No Food Insecurity  ? Worried About Charity fundraiser in the Last Year: Never true  ? Ran Out of Food in the Last Year: Never true  ?Transportation Needs: No Transportation Needs  ? Lack of Transportation (Medical): No  ? Lack of Transportation (Non-Medical): No  ?Physical Activity: Inactive  ? Days of Exercise per Week: 0 days  ? Minutes of Exercise per Session: 0 min  ?Stress: No Stress Concern Present  ? Feeling of Stress : Not at all  ?Social Connections: Not on file  ?Intimate Partner Violence: Not At Risk  ? Fear of Current or Ex-Partner: No  ? Emotionally Abused: No  ? Physically Abused: No  ? Sexually Abused: No  ?  ? ?Allergies  ?Allergen Reactions  ? Doxycycline Nausea And Vomiting  ? Vancomycin   ?  Nausea and abdominal pain  ? Erythromycin Base Nausea Only  ?  ? ?Outpatient Medications Prior to Visit  ?Medication Sig Dispense Refill  ? budesonide-formoterol (SYMBICORT) 160-4.5 MCG/ACT inhaler INHALE 2 PUFFS INTO THE LUNGS IN THE MORNING AND AT BEDTIME (Patient taking differently: Inhale 2 puffs into the lungs 2 (two) times daily as needed  (asthma).) 10.2 g 5  ? esomeprazole (NEXIUM) 40 MG capsule Take 1 capsule (40 mg total) by mouth daily before breakfast. 90 capsule 3  ? Multiple Vitamins-Minerals (MULTIVITAMIN WITH MINERALS) tablet Take 1 tablet by mouth daily.    ? acetaminophen (TYLENOL) 325 MG tablet Take 650 mg by mouth every 6 (six) hours as needed for mild pain.     ? ALPRAZolam (XANAX) 0.25 MG tablet Take 0.5-2 tablets (0.125-0.5 mg total) by mouth 2 (two) times daily as needed for anxiety or sleep. (Patient not taking: Reported on 02/18/2021) 20 tablet 0  ? amoxicillin-clavulanate (AUGMENTIN) 875-125  MG tablet Take 1 tablet by mouth 2 (two) times daily. (Patient not taking: Reported on 02/18/2021) 20 tablet 0  ? benzonatate (TESSALON) 100 MG capsule Take 1 capsule (100 mg total) by mouth 3 (three) times daily as needed for cough. (Patient not taking: Reported on 02/18/2021) 30 capsule 0  ? ?No facility-administered medications prior to visit.  ? ? ?Review of Systems  ?Constitutional:  Negative for chills, fever, malaise/fatigue and weight loss.  ?HENT:  Negative for hearing loss, sore throat and tinnitus.   ?Eyes:  Negative for blurred vision and double vision.  ?Respiratory:  Negative for cough, hemoptysis, sputum production, shortness of breath, wheezing and stridor.   ?Cardiovascular:  Negative for chest pain, palpitations, orthopnea, leg swelling and PND.  ?Gastrointestinal:  Negative for abdominal pain, constipation, diarrhea, heartburn, nausea and vomiting.  ?Genitourinary:  Negative for dysuria, hematuria and urgency.  ?Musculoskeletal:  Negative for joint pain and myalgias.  ?Skin:  Negative for itching and rash.  ?Neurological:  Negative for dizziness, tingling, weakness and headaches.  ?Endo/Heme/Allergies:  Negative for environmental allergies. Does not bruise/bleed easily.  ?Psychiatric/Behavioral:  Negative for depression. The patient is not nervous/anxious and does not have insomnia.   ?All other systems reviewed and are  negative. ? ? ?Objective:  ?Physical Exam ?Vitals reviewed.  ?Constitutional:   ?   General: She is not in acute distress. ?   Appearance: She is well-developed.  ?HENT:  ?   Head: Normocephalic and atraumatic.  ?Eyes:  ?

## 2021-06-01 NOTE — Patient Instructions (Signed)
Thank you for visiting Dr. Valeta Harms at [facility]. ?Today we recommend the following: ? ?Orders Placed This Encounter  ?Procedures  ? CT Chest Wo Contrast  ? ?Follow up with Dr. Valeta Harms or Eric Form, NP after CT chest complete.  ? ?Return in about 12 weeks (around 08/24/2021). ? ? ? ?Please do your part to reduce the spread of COVID-19.  ? ?

## 2021-06-08 NOTE — Progress Notes (Signed)
? ?Subjective:  ? ? Patient ID: Danielle Fox, female    DOB: 1951-02-05, 71 y.o.   MRN: [id number] ? ?Chief Complaint  ?Patient presents with  ? Follow-up  ? ? ?HPI  ?Patient is in today for a follow up. She denies any recent febrile illness or hospitalizations. She is doing well. She is established with pulmonology, Dr. I card now and doing well.  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  She has repeat CT scan in 4 months to continue to monitor. Denies CP/palp/HA/congestion/fevers/GI or GU c/o. Taking meds as prescribed  ? ?Past Medical History:  ?Diagnosis Date  ? Allergy   ? Clostridioides difficile infection 10/19/2018  ? tested 10/2018 negative  ? Colon polyps   ? Constipation 10/06/2014  ? COPD (chronic obstructive pulmonary disease) ([place])   ? Osteopenia   ? 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 Preventative health care 06/29/2016  ? Pruritus 06/29/2016  ? Pyloric stenosis in adult 05/2019  ? Welcome to Medicare preventive visit 06/29/2016  ? ? ?Past Surgical History:  ?Procedure Laterality Date  ? ABDOMINAL HYSTERECTOMY    ? APPENDECTOMY    ? COLONOSCOPY    ? 01-14-2005  ? DILATION AND CURETTAGE OF UTERUS    ? FINE NEEDLE ASPIRATION  02/27/2021  ? Procedure: FINE NEEDLE ASPIRATION (FNA) LINEAR;  Surgeon: Garner Nash, DO;  Location: [facility];  Service: Pulmonary;;  ? SINUS IRRIGATION  04/02/2015  ? TUBAL LIGATION    ? UPPER GASTROINTESTINAL ENDOSCOPY  11/22/2018  ? VIDEO BRONCHOSCOPY WITH ENDOBRONCHIAL ULTRASOUND Bilateral 02/27/2021  ? Procedure: VIDEO BRONCHOSCOPY WITH ENDOBRONCHIAL ULTRASOUND;  Surgeon: Garner Nash, DO;  Location: [facility];  Service: Pulmonary;  Laterality: Bilateral;  ? ? ?Family History  ?Problem Relation Age of Onset  ? Heart disease Mother   ?     cabg, MI  ? Alcohol abuse Father   ? Cirrhosis Father   ? Heart disease Father   ?     MI  ? Colon cancer Neg Hx   ? Colon polyps Neg Hx   ? Esophageal cancer Neg Hx   ? Stomach cancer Neg Hx   ? Rectal  cancer Neg Hx   ? ? ?Social History  ? ?Socioeconomic History  ? Marital status: Married  ?  Spouse name: Not on file  ? Number of children: 3  ? Years of education: Not on file  ? Highest education level: Not on file  ?Occupational History  ? 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  Employer: [employer]  ?Tobacco Use  ? Smoking status: Former  ?  Packs/day: 1.00  ?  Years: 48.00  ?  Pack years: 48.00  ?  Types: Cigarettes  ?  Quit date: 02/19/2014  ?  Years since quitting: 7.3  ? Smokeless tobacco: Never  ? Tobacco comments:  ?  Quit and has no desire to smoke again,  ?Vaping Use  ? Vaping Use: Never used  ?Substance and Sexual Activity  ? Alcohol use: Yes  ?  Alcohol/week: 0.0 standard drinks  ?  Comment: socially  ? Drug use: No  ? Sexual activity: Yes  ?  Partners: Male  ?Other Topics Concern  ? Not on file  ?Social History Narrative  ? Exercise-- walks dog on occasion  ? Right Handed  ? One Story Home  ? Does not drink caffeine   ? ?Social Determinants of Health  ? ?Financial Resource Strain: Low  Risk   ? Difficulty of Paying Living Expenses: Not hard at all  ?Food Insecurity: No Food Insecurity  ? Worried About Charity fundraiser in the Last Year: Never true  ? Ran Out of Food in the Last Year: Never true  ?Transportation Needs: No Transportation Needs  ? Lack of Transportation (Medical): No  ? Lack of Transportation (Non-Medical): No  ?Physical Activity: Inactive  ? Days of Exercise per Week: 0 days  ? Minutes of Exercise per Session: 0 min  ?Stress: No Stress Concern Present  ? Feeling of Stress : Not at all  ?Social Connections: Not on file  ?Intimate Partner Violence: Not At Risk  ? Fear of Current or Ex-Partner: No  ? Emotionally Abused: No  ? Physically Abused: No  ? Sexually Abused: No  ? ? ?Outpatient Medications Prior to Visit  ?Medication Sig Dispense Refill  ? budesonide-formoterol (SYMBICORT) 160-4.5 MCG/ACT inhaler INHALE 2 PUFFS INTO THE LUNGS IN THE MORNING AND AT BEDTIME (Patient taking  differently: Inhale 2 puffs into the lungs 2 (two) times daily as needed (asthma).) 10.2 g 5  ? esomeprazole (NEXIUM) 40 MG capsule Take 1 capsule (40 mg total) by mouth daily before breakfast. 90 capsule 3  ? Multiple Vitamins-Minerals (MULTIVITAMIN WITH MINERALS) tablet Take 1 tablet by mouth daily.    ? ?No facility-administered medications prior to visit.  ? ? ?Allergies  ?Allergen Reactions  ? Doxycycline Nausea And Vomiting  ? Vancomycin   ?  Nausea and abdominal pain  ? Erythromycin Base Nausea Only  ? ? ?Review of Systems  ?Constitutional:  Negative for fever and malaise/fatigue.  ?HENT:  Positive for congestion.   ?Eyes:  Negative for blurred vision.  ?Respiratory:  Positive for cough and shortness of breath.   ?Cardiovascular:  Negative for chest pain, palpitations and leg swelling.  ?Gastrointestinal:  Negative for abdominal pain, blood in stool and nausea.  ?Genitourinary:  Negative for dysuria and frequency.  ?Musculoskeletal:  Negative for falls.  ?Skin:  Negative for rash.  ?Neurological:  Negative for dizziness, loss of consciousness and headaches.  ?Endo/Heme/Allergies:  Negative for environmental allergies.  ?Psychiatric/Behavioral:  Negative for depression. The patient is not nervous/anxious.   ? ?   ?Objective:  ?  ?Physical Exam ?Constitutional:   ?   General: She is not in acute distress. ?   Appearance: She is well-developed.  ?HENT:  ?   Head: Normocephalic and atraumatic.  ?Eyes:  ?   Conjunctiva/sclera: Conjunctivae normal.  ?Neck:  ?   Thyroid: No thyromegaly.  ?Cardiovascular:  ?   Rate and Rhythm: Normal rate and regular rhythm.  ?   Heart sounds: Normal heart sounds. No murmur heard. ?Pulmonary:  ?   Effort: Pulmonary effort is normal. No respiratory distress.  ?   Breath sounds: Normal breath sounds.  ?Abdominal:  ?   General: Bowel sounds are normal. There is no distension.  ?   Palpations: Abdomen is soft. There is no mass.  ?   Tenderness: There is no abdominal tenderness.   ?Musculoskeletal:  ?   Cervical back: Neck supple.  ?Lymphadenopathy:  ?   Cervical: No cervical adenopathy.  ?Skin: ?   General: Skin is warm and dry.  ?Neurological:  ?   Mental Status: She is alert and oriented to person, place, and time.  ?Psychiatric:     ?   Behavior: Behavior normal.  ? ? ?BP 122/80 (BP Location: Left Arm, Patient Position: Sitting, Cuff Size: Normal)  Pulse 77   Resp 20   Ht '5\' 1"'$  (1.549 m)   Wt 122 lb 6.4 oz (55.5 kg)   SpO2 98%   BMI 23.13 kg/m?  ?Wt Readings from Last 3 Encounters:  ?06/09/21 122 lb 6.4 oz (55.5 kg)  ?06/01/21 121 lb 6.4 oz (55.1 kg)  ?03/13/21 121 lb 6.4 oz (55.1 kg)  ? ? ?Diabetic Foot Exam - Simple   ?No data filed ?  ? ?Lab Results  ?Component Value Date  ? WBC 12.9 (H) 02/27/2021  ? HGB 13.9 02/27/2021  ? HCT 43.4 02/27/2021  ? PLT 201 02/27/2021  ? GLUCOSE 82 12/02/2020  ? CHOL 245 (H) 12/02/2020  ? TRIG 110.0 12/02/2020  ? HDL 69.10 12/02/2020  ? LDLDIRECT 155.9 12/14/2012  ? LDLCALC 154 (H) 12/02/2020  ? ALT 18 12/02/2020  ? AST 21 12/02/2020  ? NA 139 12/02/2020  ? K 4.4 12/02/2020  ? CL 102 12/02/2020  ? CREATININE 0.97 12/02/2020  ? BUN 19 12/02/2020  ? CO2 30 12/02/2020  ? TSH 2.30 12/02/2020  ? INR 1.0 09/27/2019  ? HGBA1C 5.8 12/02/2020  ? ? ?Lab Results  ?Component Value Date  ? TSH 2.30 12/02/2020  ? ?Lab Results  ?Component Value Date  ? WBC 12.9 (H) 02/27/2021  ? HGB 13.9 02/27/2021  ? HCT 43.4 02/27/2021  ? MCV 91.0 02/27/2021  ? PLT 201 02/27/2021  ? ?Lab Results  ?Component Value Date  ? NA 139 12/02/2020  ? K 4.4 12/02/2020  ? CO2 30 12/02/2020  ? GLUCOSE 82 12/02/2020  ? BUN 19 12/02/2020  ? CREATININE 0.97 12/02/2020  ? BILITOT 0.8 12/02/2020  ? ALKPHOS 156 (H) 12/02/2020  ? AST 21 12/02/2020  ? ALT 18 12/02/2020  ? PROT 7.2 12/02/2020  ? ALBUMIN 4.4 12/02/2020  ? CALCIUM 10.1 12/02/2020  ? ANIONGAP 13 10/12/2019  ? GFR 59.25 (L) 12/02/2020  ? ?Lab Results  ?Component Value Date  ? CHOL 245 (H) 12/02/2020  ? ?Lab Results  ?Component Value  Date  ? HDL 69.10 12/02/2020  ? ?Lab Results  ?Component Value Date  ? LDLCALC 154 (H) 12/02/2020  ? ?Lab Results  ?Component Value Date  ? TRIG 110.0 12/02/2020  ? ?Lab Results  ?Component Value Date  ? CHOLHDL 4 12/02/2020

## 2021-06-09 ENCOUNTER — Ambulatory Visit (INDEPENDENT_AMBULATORY_CARE_PROVIDER_SITE_OTHER): Payer: Medicare Other | Admitting: Family Medicine

## 2021-06-09 ENCOUNTER — Encounter: Payer: Self-pay | Admitting: Family Medicine

## 2021-06-09 VITALS — BP 122/80 | HR 77 | Resp 20 | Ht 61.0 in | Wt 122.4 lb

## 2021-06-09 DIAGNOSIS — M8580 Other specified disorders of bone density and structure, unspecified site: Secondary | ICD-10-CM | POA: Diagnosis not present

## 2021-06-09 DIAGNOSIS — R053 Chronic cough: Secondary | ICD-10-CM | POA: Diagnosis not present

## 2021-06-09 DIAGNOSIS — N9489 Other specified conditions associated with female genital organs and menstrual cycle: Secondary | ICD-10-CM

## 2021-06-09 DIAGNOSIS — E785 Hyperlipidemia, unspecified: Secondary | ICD-10-CM | POA: Diagnosis not present

## 2021-06-09 DIAGNOSIS — D649 Anemia, unspecified: Secondary | ICD-10-CM

## 2021-06-09 DIAGNOSIS — E559 Vitamin D deficiency, unspecified: Secondary | ICD-10-CM

## 2021-06-09 NOTE — Assessment & Plan Note (Signed)
Supplement and monitor 

## 2021-06-09 NOTE — Patient Instructions (Signed)
Multivitamin with minerals ?

## 2021-06-09 NOTE — Assessment & Plan Note (Signed)
Encourage heart healthy diet such as MIND or DASH diet, increase exercise, avoid trans fats, simple carbohydrates and processed foods, consider a krill or fish or flaxseed oil cap daily. Declines labs today ?

## 2021-06-09 NOTE — Assessment & Plan Note (Addendum)
Needs adequate exercise, calcium and vitamin d intake ?

## 2021-06-09 NOTE — Assessment & Plan Note (Signed)
Following with pulmonology and doing some better. No changes to therapy, concerns noted regarding possibility of sarcoidosis ?

## 2021-07-16 ENCOUNTER — Ambulatory Visit (INDEPENDENT_AMBULATORY_CARE_PROVIDER_SITE_OTHER): Payer: Medicare Other

## 2021-07-16 VITALS — BP 101/63 | HR 63 | Temp 97.9°F | Resp 16 | Ht 61.0 in | Wt 120.4 lb

## 2021-07-16 DIAGNOSIS — Z0000 Encounter for general adult medical examination without abnormal findings: Secondary | ICD-10-CM | POA: Diagnosis not present

## 2021-07-16 NOTE — Patient Instructions (Signed)
Danielle Fox , Thank you for taking time to come for your Medicare Wellness Visit. I appreciate your ongoing commitment to your health goals. Please review the following plan we discussed and let me know if I can assist you in the future.   Screening recommendations/referrals: Colonoscopy: 11/22/18 due 11/21/28 Mammogram: N/A Bone Density: 01/12/21 due 01/13/23 Recommended yearly ophthalmology/optometry visit for glaucoma screening and checkup Recommended yearly dental visit for hygiene and checkup  Vaccinations: Influenza vaccine: up to date Pneumococcal vaccine: Due-May obtain vaccine at our office or your local pharmacy.  Tdap vaccine: up to date Shingles vaccine: Due-May obtain vaccine at our office or your local pharmacy.    Covid-19:Due-May obtain vaccine at our office or your local pharmacy.   Advanced directives: yes, not on file  Conditions/risks identified: see problem list  Next appointment: Follow up in one year for your annual wellness visit    Preventive Care 65 Years and Older, Female Preventive care refers to lifestyle choices and visits with your health care provider that can promote health and wellness. What does preventive care include? A yearly physical exam. This is also called an annual well check. Dental exams once or twice a year. Routine eye exams. Ask your health care provider how often you should have your eyes checked. Personal lifestyle choices, including: Daily care of your teeth and gums. Regular physical activity. Eating a healthy diet. Avoiding tobacco and drug use. Limiting alcohol use. Practicing safe sex. Taking low-dose aspirin every day. Taking vitamin and mineral supplements as recommended by your health care provider. What happens during an annual well check? The services and screenings done by your health care provider during your annual well check will depend on your age, overall health, lifestyle risk factors, and family history of  disease. Counseling  Your health care provider may ask you questions about your: Alcohol use. Tobacco use. Drug use. Emotional well-being. Home and relationship well-being. Sexual activity. Eating habits. History of falls. Memory and ability to understand (cognition). Work and work Statistician. Reproductive health. Screening  You may have the following tests or measurements: Height, weight, and BMI. Blood pressure. Lipid and cholesterol levels. These may be checked every 5 years, or more frequently if you are over 40 years old. Skin check. Lung cancer screening. You may have this screening every year starting at age 1 if you have a 30-pack-year history of smoking and currently smoke or have quit within the past 15 years. Fecal occult blood test (FOBT) of the stool. You may have this test every year starting at age 79. Flexible sigmoidoscopy or colonoscopy. You may have a sigmoidoscopy every 5 years or a colonoscopy every 10 years starting at age 75. Hepatitis C blood test. Hepatitis B blood test. Sexually transmitted disease (STD) testing. Diabetes screening. This is done by checking your blood sugar (glucose) after you have not eaten for a while (fasting). You may have this done every 1-3 years. Bone density scan. This is done to screen for osteoporosis. You may have this done starting at age 1. Mammogram. This may be done every 1-2 years. Talk to your health care provider about how often you should have regular mammograms. Talk with your health care provider about your test results, treatment options, and if necessary, the need for more tests. Vaccines  Your health care provider may recommend certain vaccines, such as: Influenza vaccine. This is recommended every year. Tetanus, diphtheria, and acellular pertussis (Tdap, Td) vaccine. You may need a Td booster every 10 years. Zoster  vaccine. You may need this after age 76. Pneumococcal 13-valent conjugate (PCV13) vaccine. One  dose is recommended after age 66. Pneumococcal polysaccharide (PPSV23) vaccine. One dose is recommended after age 51. Talk to your health care provider about which screenings and vaccines you need and how often you need them. This information is not intended to replace advice given to you by your health care provider. Make sure you discuss any questions you have with your health care provider. Document Released: 03/14/2015 Document Revised: 11/05/2015 Document Reviewed: 12/17/2014 Elsevier Interactive Patient Education  2017 [place] Prevention in the Home Falls can cause injuries. They can happen to people of all ages. There are many things you can do to make your home safe and to help prevent falls. What can I do on the outside of my home? Regularly fix the edges of walkways and driveways and fix any cracks. Remove anything that might make you trip as you walk through a door, such as a raised step or threshold. Trim any bushes or trees on the path to your home. Use bright outdoor lighting. Clear any walking paths of anything that might make someone trip, such as rocks or tools. Regularly check to see if handrails are loose or broken. Make sure that both sides of any steps have handrails. Any raised decks and porches should have guardrails on the edges. Have any leaves, snow, or ice cleared regularly. Use sand or salt on walking paths during winter. Clean up any spills in your garage right away. This includes oil or grease spills. What can I do in the bathroom? Use night lights. Install grab bars by the toilet and in the tub and shower. Do not use towel bars as grab bars. Use non-skid mats or decals in the tub or shower. If you need to sit down in the shower, use a plastic, non-slip stool. Keep the floor dry. Clean up any water that spills on the floor as soon as it happens. Remove soap buildup in the tub or shower regularly. Attach bath mats securely with double-sided  non-slip rug tape. Do not have throw rugs and other things on the floor that can make you trip. What can I do in the bedroom? Use night lights. Make sure that you have a light by your bed that is easy to reach. Do not use any sheets or blankets that are too big for your bed. They should not hang down onto the floor. Have a firm chair that has side arms. You can use this for support while you get dressed. Do not have throw rugs and other things on the floor that can make you trip. What can I do in the kitchen? Clean up any spills right away. Avoid walking on wet floors. Keep items that you use a lot in easy-to-reach places. If you need to reach something above you, use a strong step stool that has a grab bar. Keep electrical cords out of the way. Do not use floor polish or wax that makes floors slippery. If you must use wax, use non-skid floor wax. Do not have throw rugs and other things on the floor that can make you trip. What can I do with my stairs? Do not leave any items on the stairs. Make sure that there are handrails on both sides of the stairs and use them. Fix handrails that are broken or loose. Make sure that handrails are as long as the stairways. Check any carpeting to make sure that it  is firmly attached to the stairs. Fix any carpet that is loose or worn. Avoid having throw rugs at the top or bottom of the stairs. If you do have throw rugs, attach them to the floor with carpet tape. Make sure that you have a light switch at the top of the stairs and the bottom of the stairs. If you do not have them, ask someone to add them for you. What else can I do to help prevent falls? Wear shoes that: Do not have high heels. Have rubber bottoms. Are comfortable and fit you well. Are closed at the toe. Do not wear sandals. If you use a stepladder: Make sure that it is fully opened. Do not climb a closed stepladder. Make sure that both sides of the stepladder are locked into place. Ask  someone to hold it for you, if possible. Clearly mark and make sure that you can see: Any grab bars or handrails. First and last steps. Where the edge of each step is. Use tools that help you move around (mobility aids) if they are needed. These include: Canes. Walkers. Scooters. Crutches. Turn on the lights when you go into a dark area. Replace any light bulbs as soon as they burn out. Set up your furniture so you have a clear path. Avoid moving your furniture around. If any of your floors are uneven, fix them. If there are any pets around you, be aware of where they are. Review your medicines with your doctor. Some medicines can make you feel dizzy. This can increase your chance of falling. Ask your doctor what other things that you can do to help prevent falls. This information is not intended to replace advice given to you by your health care provider. Make sure you discuss any questions you have with your health care provider. Document Released: 12/12/2008 Document Revised: 07/24/2015 Document Reviewed: 03/22/2014 Elsevier Interactive Patient Education  2017 [employer].

## 2021-07-16 NOTE — Progress Notes (Addendum)
Subjective:   Danielle Fox is a 71 y.o. female who presents for Medicare Annual (Subsequent) preventive examination.  Review of Systems     Cardiac Risk Factors include: advanced age (>81mn, >>39women);dyslipidemia     Objective:    Today's Vitals   07/16/21 1115  BP: 101/63  Pulse: 63  Resp: 16  Temp: 97.9 F (36.6 C)  SpO2: 97%  Weight: 120 lb 6.4 oz (54.6 kg)  Height: '5\' 1"'$  (1.549 m)   Body mass index is 22.75 kg/m.     07/16/2021   11:22 AM 02/25/2021    1:28 PM 07/10/2020   10:59 AM 10/08/2019    2:20 PM 10/08/2019    5:42 AM 10/02/2019   12:52 PM 09/27/2019    3:23 PM  Advanced Directives  Does Patient Have a Medical Advance Directive? Yes No Yes Yes Yes Yes No  Type of AParamed[place]of APort [place]on[place] of facility DNR (pink MOST or yellow form);Living will Living will HSpokane CreekLiving will HLost Lake WoodsLiving will HSaylorvilleLiving will HGrays PrairieLiving will;Out of facility DNR (pink MOST or yellow form)   Does patient want to make changes to medical advance directive?  No - Patient declined  No - Patient declined No - Patient declined    Copy of [place] Chart? No - copy requested   No - copy requested No - copy requested    Would patient like information on creating a medical advance directive?  No - Patient declined  No - Patient declined No - Patient declined  No - Guardian declined    Current Medications (verified) Outpatient Encounter Medications as of 07/16/2021  Medication Sig   budesonide-formoterol (SYMBICORT) 160-4.5 MCG/ACT inhaler INHALE 2 PUFFS INTO THE LUNGS IN THE MORNING AND AT BEDTIME (Patient taking differently: Inhale 2 puffs into the lungs 2 (two) times daily as needed (asthma).)   esomeprazole (NEXIUM) 40 MG capsule Take 1 capsule (40 mg total) by mouth daily before breakfast.   Multiple Vitamins-Minerals (MULTIVITAMIN WITH  MINERALS) tablet Take 1 tablet by mouth daily.   No facility-administered encounter medications on file as of 07/16/2021.    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   Preventative health care 06/29/2016   Pruritus 06/29/2016   Pyloric stenosis in adult 05/2019   Welcome to Medicare preventive visit 06/29/2016   Past Surgical History:  Procedure Laterality Date   ABDOMINAL HYSTERECTOMY     APPENDECTOMY     COLONOSCOPY     01-14-2005   DILATION AND CURETTAGE OF UTERUS     FINE NEEDLE ASPIRATION  02/27/2021   Procedure: FINE NEEDLE ASPIRATION (FNA) LINEAR;  Surgeon: IGarner Nash DO;  [facility];  Service: Pulmonary;;   SINUS IRRIGATION  04/02/2015   TUBAL LIGATION     UPPER GASTROINTESTINAL ENDOSCOPY  11/22/2018   VIDEO BRONCHOSCOPY WITH ENDOBRONCHIAL ULTRASOUND Bilateral 02/27/2021   Procedure: VIDEO BRONCHOSCOPY WITH ENDOBRONCHIAL ULTRASOUND;  Surgeon: IGarner Nash DO;  [place] [facility]  Service: Pulmonary;  Laterality: Bilateral;   Family History  Problem Relation Age of Onset   Heart  disease Mother        cabg, MI   Alcohol abuse Father    Cirrhosis Father    Heart disease Father        MI   Colon cancer Neg Hx    Colon polyps Neg Hx    Esophageal cancer Neg Hx    Stomach cancer Neg Hx    Rectal cancer Neg Hx    Social History   Socioeconomic History   Marital status: Married    Spouse name: Not on file   Number of children: 3   Years of education: Not on file   Highest education level: Not on file  Occupational History   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[employer], retired    Fish farm manager: [employer]  Tobacco Use   Smoking status: Former     Packs/day: 1.00    Years: 48.00    Pack years: 48.00    Types: Cigarettes    Quit date: 02/19/2014    Years since quitting: 7.4   Smokeless tobacco: Never   Tobacco comments:    Quit and has no desire to smoke again,  Vaping Use   Vaping Use: Never used  Substance and Sexual Activity   Alcohol use: Yes    Alcohol/week: 0.0 standard drinks    Comment: socially   Drug use: No   Sexual activity: Yes    Partners: Male  Other Topics Concern   Not on file  Social History Narrative   Exercise-- walks dog on occasion   Right Handed   One Story Home   Does not drink caffeine    Social Determinants of Health   Financial Resource Strain: Low Risk    Difficulty of Paying Living Expenses: Not hard at all  Food Insecurity: No Food Insecurity   Worried About Charity fundraiser in the Last Year: Never true   [place] in the Last Year: Never true  Transportation Needs: No Transportation Needs   Lack of Transportation (Medical): No   Lack of Transportation (Non-Medical): No  Physical Activity: Sufficiently Active   Days of Exercise per Week: 7 days   Minutes of Exercise per Session: 60 min  Stress: No Stress Concern Present   Feeling of Stress : Not at all  Social Connections: Socially Integrated   Frequency of Communication with Friends and Family: More than three times a week   Frequency of Social Gatherings with Friends and Family: More than three times a week   Attends Religious Services: More than 4 times per year   Active Member of [employer] or Organizations: Yes   Attends Music therapist: More than 4 times per year   Marital Status: Married    Tobacco Counseling Counseling given: Not Answered Tobacco comments: Quit and has no desire to smoke again,   Clinical Intake:  Pre-visit preparation completed: Yes  Pain : No/denies pain     Nutritional Risks: None Diabetes: No  How often do you need to have someone help you when you read instructions,  pamphlets, or other written materials from your doctor or pharmacy?: 1 - Never  Diabetic?No  Interpreter Needed?: No  Information entered by :: [place] of Daily Living    07/16/2021   11:27 AM  In your present state of health, do you have any difficulty performing the following activities:  Hearing? 0  Vision? 0  Difficulty concentrating or making decisions? 0  Walking or climbing stairs? 0  Dressing or bathing? 0  Doing errands, shopping? 0  Preparing Food and eating ? N  Using the Toilet? N  In the past six months, have you accidently leaked urine? Y  Do you have problems with loss of bowel control? N  Managing your Medications? N  Managing your Finances? N  Housekeeping or managing your Housekeeping? N    Patient Care Team: Mosie Lukes, MD as PCP - General (Family Medicine)  Indicate any recent Medical Services you may have received from other than [facility] providers in the past year (date may be approximate).     Assessment:   This is a routine wellness examination for Stefany.  Hearing/Vision screen No results found.  Dietary issues and exercise activities discussed: Current Exercise Habits: Home exercise routine, Type of exercise: walking;stretching, Time (Minutes): 60, Frequency (Times/Week): 7, Weekly Exercise (Minutes/Week): 420, Intensity: Mild   Goals Addressed             This Visit's Progress    Patient Stated   On track    Maintain healthy active lifestyle.        Depression Screen    06/09/2021    9:56 AM 07/10/2020   11:00 AM 05/13/2020   11:02 AM 03/08/2019   11:13 AM 12/28/2016    6:21 PM 06/29/2016    8:43 AM 12/23/2015    6:10 PM  PHQ 2/9 Scores  PHQ - 2 Score 0 0 0 0 0 0 0    Fall Risk    07/16/2021   11:22 AM 06/09/2021    9:53 AM 12/26/2020   12:12 PM 07/10/2020   11:00 AM 10/02/2019   12:50 PM  Fall Risk   Falls in the past year? 0 0 0 0 0  Number falls in past yr: 0 0 0 0 0  Injury with Fall? 0 0 0 0 0   Risk for fall due to : No Fall Risks No Fall Risks No Fall Risks    Follow up Falls evaluation completed Falls evaluation completed Falls evaluation completed Falls prevention discussed     FALL RISK PREVENTION PERTAINING TO THE HOME:  Any stairs in or around the home? No  If so, are there any without handrails?  N/A Home free of loose throw rugs in walkways, pet beds, electrical cords, etc? Yes  Adequate lighting in your home to reduce risk of falls? Yes   ASSISTIVE DEVICES UTILIZED TO PREVENT FALLS:  Life alert? No  Use of a cane, walker or w/c? No  Grab bars in the bathroom? No  Shower chair or bench in shower? Yes  Elevated toilet seat or a handicapped toilet? No   TIMED UP AND GO:  Was the test performed? Yes .  Length of time to ambulate 10 feet: 8 sec.   Gait steady and fast without use of assistive device  Cognitive Function:        Immunizations Immunization History  Administered Date(s) Administered   Fluad Quad(high Dose 65+) 01/29/2019, 12/28/2019, 12/02/2020   Influenza Split 01/07/2011   Influenza Whole 11/29/2009   Influenza, High Dose Seasonal PF 11/07/2017   Influenza,inj,Quad PF,6+ Mos 12/14/2012   Influenza-Unspecified 12/28/2013, 12/29/2014, 12/01/2015, 12/25/2016   PFIZER(Purple Top)SARS-COV-2 Vaccination 05/06/2019, 06/06/2019, 12/15/2019   Pneumococcal Conjugate-13 06/29/2016   Tdap 03/01/2001, 06/29/2016   Zoster Recombinat (Shingrix) 03/07/2018    TDAP status: Up to date  Flu Vaccine status: Up to date  Pneumococcal vaccine status: Due, Education has been provided regarding the importance of this vaccine. Advised may receive this  vaccine at local pharmacy or Health Dept. Aware to provide a copy of the vaccination record if obtained from local pharmacy or Health Dept. Verbalized acceptance and understanding.  Covid-19 vaccine status: Declined, Education has been provided regarding the importance of this vaccine but patient still declined.  Advised may receive this vaccine at local pharmacy or Health Dept.or vaccine clinic. Aware to provide a copy of the vaccination record if obtained from local pharmacy or Health Dept. Verbalized acceptance and understanding.  Qualifies for Shingles Vaccine? Yes   Zostavax completed No   Shingrix Completed?: No.    Education has been provided regarding the importance of this vaccine. Patient has been advised to call insurance company to determine out of pocket expense if they have not yet received this vaccine. Advised may also receive vaccine at local pharmacy or Health Dept. Verbalized acceptance and understanding.  Screening Tests Health Maintenance  Topic Date Due   Hepatitis C Screening  Never done   Pneumonia Vaccine 72+ Years old (2 - PPSV23 if available, else PCV20) 06/29/2017   MAMMOGRAM  01/05/2018   Zoster Vaccines- Shingrix (2 of 2) 05/02/2018   COVID-19 Vaccine (4 - Booster for Pfizer series) 02/09/2020   INFLUENZA VACCINE  09/29/2021   TETANUS/TDAP  06/30/2026   COLONOSCOPY (Pts 45-68yr Insurance coverage will need to be confirmed)  11/21/2028   DEXA SCAN  Completed   HPV VACCINES  Aged Out    Health Maintenance  Health Maintenance Due  Topic Date Due   Hepatitis C Screening  Never done   Pneumonia Vaccine 71 Years old (2 - PPSV23 if available, else PCV20) 06/29/2017   MAMMOGRAM  01/05/2018   Zoster Vaccines- Shingrix (2 of 2) 05/02/2018   COVID-19 Vaccine (4 - Booster for Pfizer series) 02/09/2020    Colorectal cancer screening: Type of screening: Colonoscopy. Completed 11/22/18. Repeat every 10 years  Mammogram status: No longer required due to N/A.  Bone Density status: Completed 01/12/21. Results reflect: Bone density results: OSTEOPOROSIS. Repeat every 2 years.  Lung Cancer Screening: (Low Dose CT Chest recommended if Age 71-80years, 30 pack-year currently smoking OR have quit w/in 15years.) does qualify.   Lung Cancer Screening Referral:  01/12/21  Additional Screening:  Hepatitis C Screening: does qualify; Completed not yet  Vision Screening: Recommended annual ophthalmology exams for early detection of glaucoma and other disorders of the eye. Is the patient up to date with their annual eye exam?  Yes  Who is the provider or what is the name of the office in which the patient attends annual eye exams? [facility] pt is not established with a provider, would they like to be referred to a provider to establish care? No .   Dental Screening: Recommended annual dental exams for proper oral hygiene  Community Resource Referral / Chronic Care Management: CRR required this visit?  No   CCM required this visit?  No      Plan:     I have personally reviewed and noted the following in the patient's chart:   Medical and social history Use of alcohol, tobacco or illicit drugs  Current medications and supplements including opioid prescriptions.  Functional ability and status Nutritional status Physical activity Advanced directives List of other physicians Hospitalizations, surgeries, and ER visits in previous 12 months Vitals Screenings to include cognitive, depression, and falls Referrals and appointments  In addition, I have reviewed and discussed with patient certain preventive protocols, quality metrics, and best practice recommendations. A written personalized care plan  for preventive services as well as general preventive health recommendations were provided to patient.     Duard Brady Maekayla Giorgio, [place]   07/20/2021   Nurse Notes: none

## 2021-08-17 ENCOUNTER — Other Ambulatory Visit: Payer: Medicare Other

## 2021-08-24 ENCOUNTER — Ambulatory Visit: Payer: Medicare Other | Admitting: Pulmonary Disease

## 2021-09-07 ENCOUNTER — Ambulatory Visit
Admission: RE | Admit: 2021-09-07 | Discharge: 2021-09-07 | Disposition: A | Discharge status: Home / Self Care | Payer: Medicare Other | Source: Ambulatory Visit | Attending: Pulmonary Disease | Admitting: Pulmonary Disease

## 2021-09-07 DIAGNOSIS — S2232XA Fracture of one rib, left side, initial encounter for closed fracture: Secondary | ICD-10-CM | POA: Diagnosis not present

## 2021-09-07 DIAGNOSIS — R9389 Abnormal findings on diagnostic imaging of other specified body structures: Secondary | ICD-10-CM

## 2021-09-07 DIAGNOSIS — J479 Bronchiectasis, uncomplicated: Secondary | ICD-10-CM | POA: Diagnosis not present

## 2021-09-07 DIAGNOSIS — J84112 Idiopathic pulmonary fibrosis: Secondary | ICD-10-CM | POA: Diagnosis not present

## 2021-09-07 DIAGNOSIS — J432 Centrilobular emphysema: Secondary | ICD-10-CM | POA: Diagnosis not present

## 2021-09-09 IMAGING — MR MR HEAD WO/W CM
10 series · 48 of 48 positions shown · IV contrast (gadavist)
Comparison: None.

CLINICAL DATA: Left hand numbness.

EXAM:
MRI HEAD WITHOUT AND WITH CONTRAST
TECHNIQUE: Multiplanar, multiecho pulse sequences of the brain and surrounding
structures were obtained without and with intravenous contrast.
CONTRAST:  5mL GADAVIST GADOBUTROL 1 MMOL/ML IV SOLN

[Series 2: T1 · sagittal · 5.0mm · 0.86mm/px · 3 of 25 slices shown (1 of 2)]
[im 1/25]
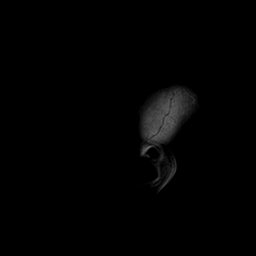
[im 13/25]
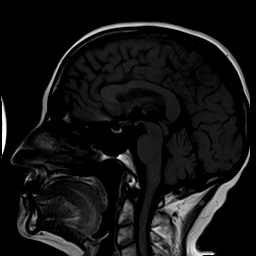
[im 25/25]
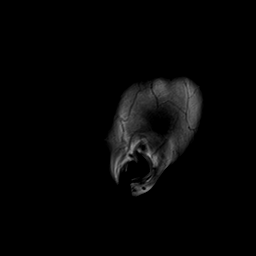

[Series 3: DWI · axial · 3.0mm · 1.88mm/px · z∈[-31,+110]mm · 9 of 90 slices shown (1 of 2)]
[im 1/90]
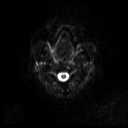
[im 12/90]
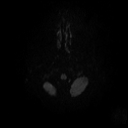
[im 23/90]
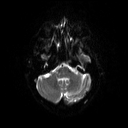
[im 34/90]
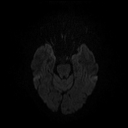
[im 45/90]
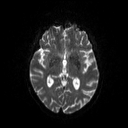
[im 56/90]
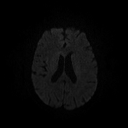
[im 67/90]
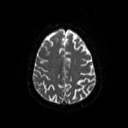
[im 78/90]
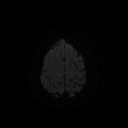
[im 90/90]
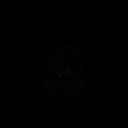

[Series 4: DWI · axial · 3.0mm · 1.88mm/px · z∈[-31,+110]mm · 4 of 45 slices shown (2 of 2)]
[im 1/45]
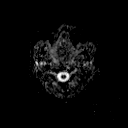
[im 15/45]
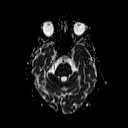
[im 30/45]
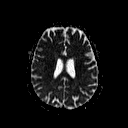
[im 45/45]
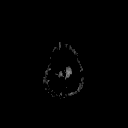

[Series 5: T2 · axial · 5.0mm · 0.66mm/px · z∈[-35,+104]mm · 2 of 25 slices shown (1 of 3)]
[im 1/25]
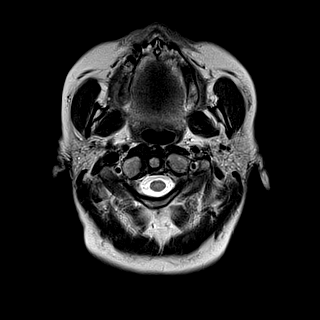
[im 25/25]
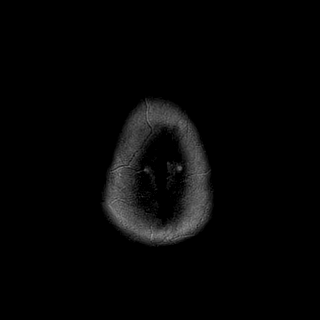

[Series 6: T2 · axial · 5.0mm · 0.41mm/px · z∈[-35,+104]mm · 2 of 25 slices shown (2 of 3)]
[im 1/25]
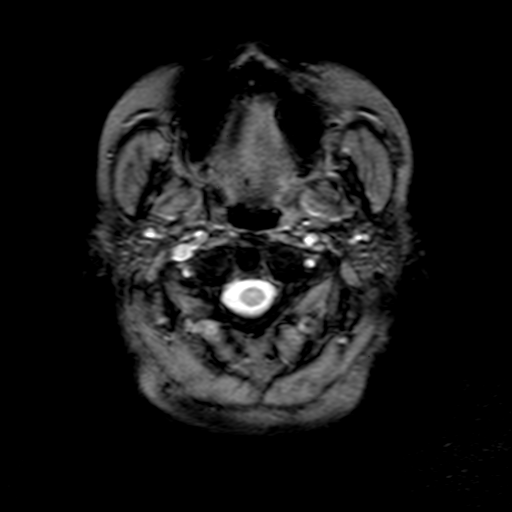
[im 25/25]
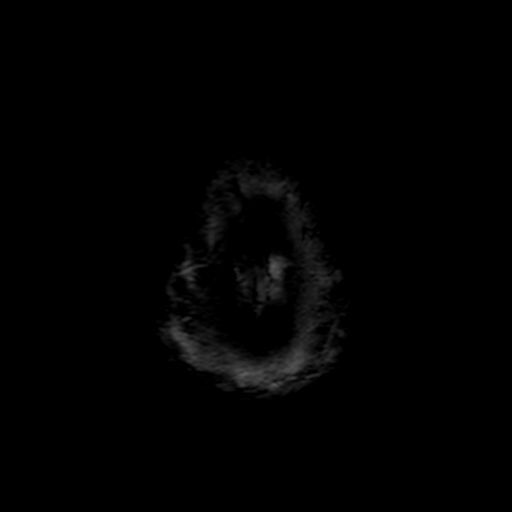

[Series 7: FLAIR · axial · 3.0mm · 0.41mm/px · z∈[-37,+106]mm · 4 of 38 slices shown]
[im 1/38]
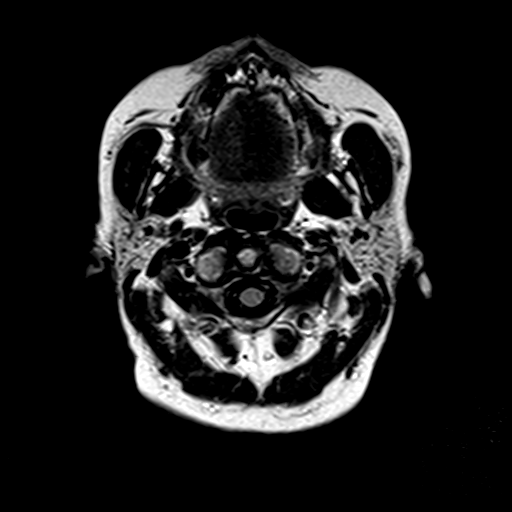
[im 13/38]
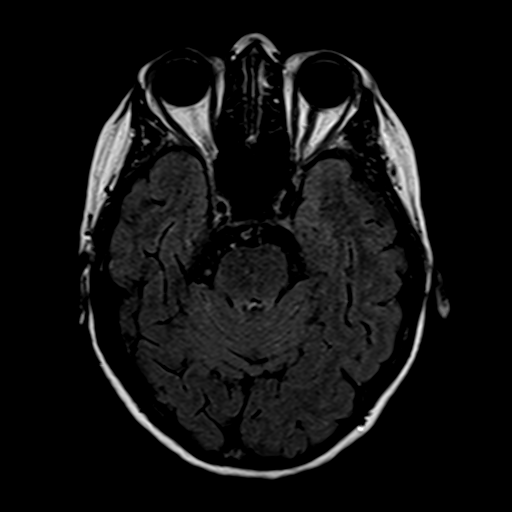
[im 25/38]
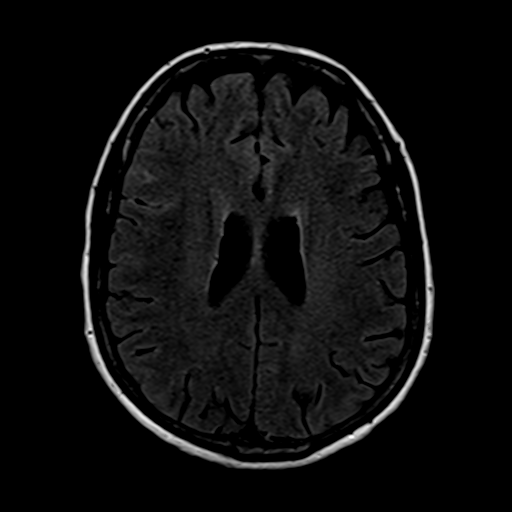
[im 38/38]
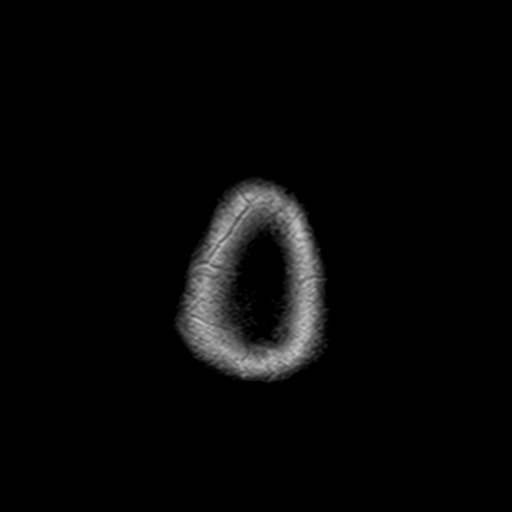

[Series 8: t1_3d_tra · axial · 2.0mm · 0.86mm/px · z∈[-44,+124]mm · 9 of 88 slices shown]
[im 1/88]
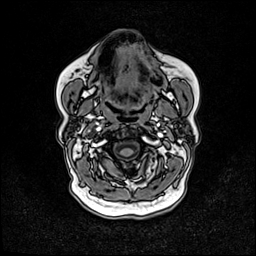
[im 11/88]
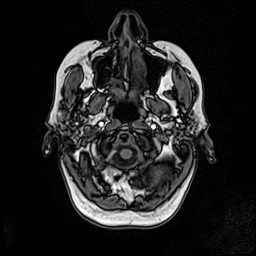
[im 22/88]
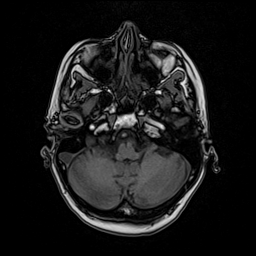
[im 33/88]
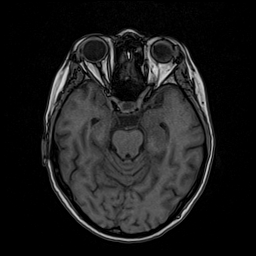
[im 44/88]
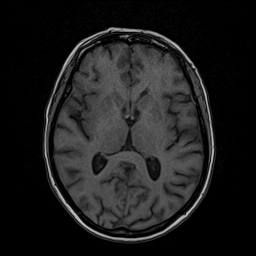
[im 55/88]
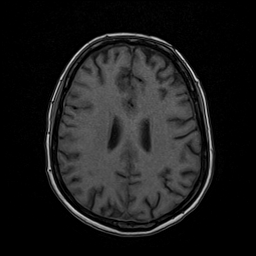
[im 66/88]
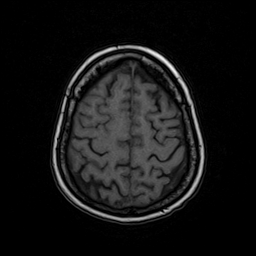
[im 77/88]
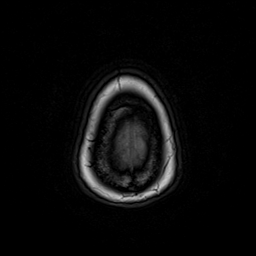
[im 88/88]
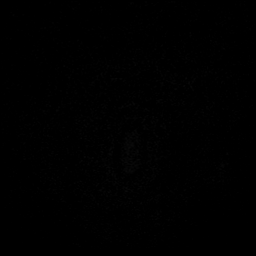

[Series 9: T2 · coronal · 5.0mm · 0.82mm/px · 3 of 30 slices shown (3 of 3)]
[im 1/30]
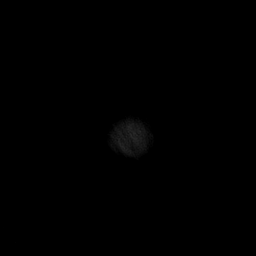
[im 15/30]
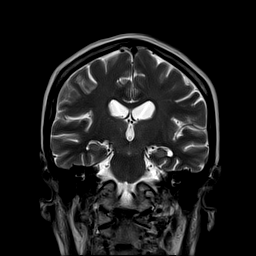
[im 30/30]
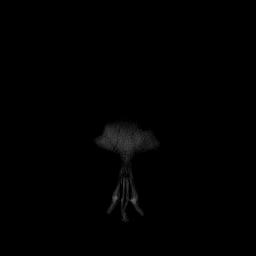

[Series 10: t1_3d_tra +c · axial · 2.0mm · 0.86mm/px · z∈[-44,+124]mm · 9 of 88 slices shown]
[im 1/88]
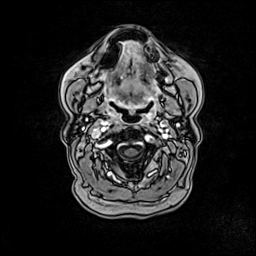
[im 11/88]
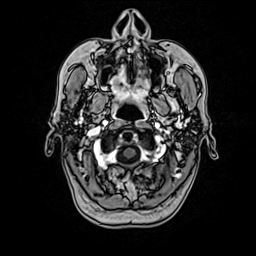
[im 22/88]
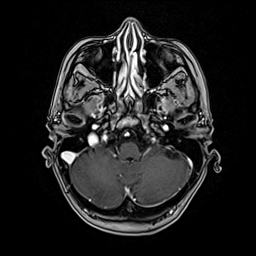
[im 33/88]
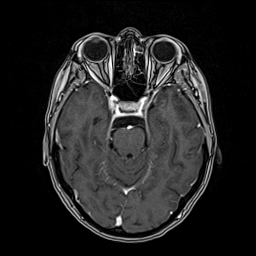
[im 44/88]
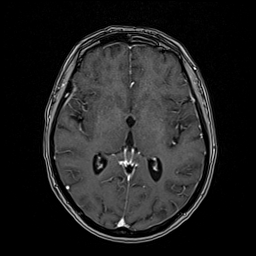
[im 55/88]
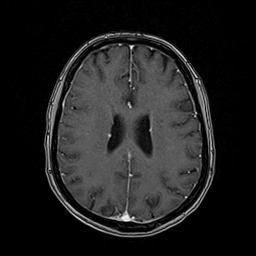
[im 66/88]
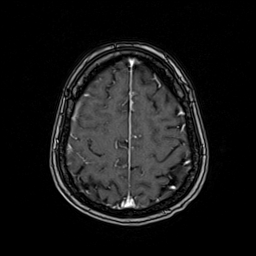
[im 77/88]
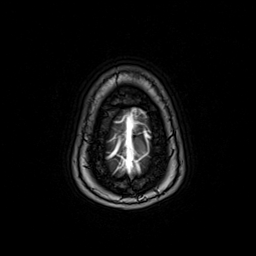
[im 88/88]
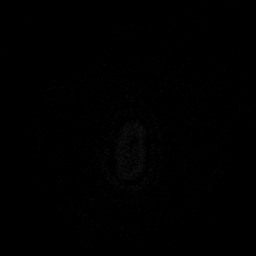

[Series 11: T1 · coronal · 5.0mm · 0.82mm/px · 3 of 30 slices shown (2 of 2)]
[im 1/30]
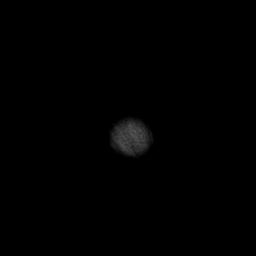
[im 15/30]
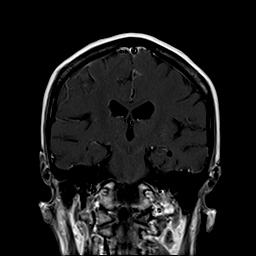
[im 30/30]
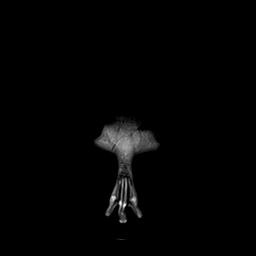

[48 of 48 positions shown; findings below may reference images not displayed]

FINDINGS: Brain: No acute infarct, acute hemorrhage or extra-axial collection.
There is cortical/sulcal hyperintensity within the anterior right
hemisphere on the axial FLAIR sequence. White matter signal is
normal. Normal volume of CSF spaces. No chronic microhemorrhage.
Normal midline structures. There is no abnormal contrast
enhancement.

Vascular: Normal flow voids.

Skull and upper cervical spine: Normal marrow signal.

Sinuses/Orbits: Negative.

Other: None.
IMPRESSION: 1. No acute intracranial abnormality.
2. Cortical/sulcal hyperintensity within the anterior right
hemisphere on the axial FLAIR sequence. According to the report from
MRI performed 09/15/2019, this finding was also present at that
time. This may be a late subacute sequela of an ischemic event.
3. No abnormal contrast enhancement is visible on the current study,
though it was reportedly present on the prior examination.

## 2021-09-22 ENCOUNTER — Ambulatory Visit: Payer: Medicare Other | Admitting: Pulmonary Disease

## 2021-09-22 ENCOUNTER — Encounter: Payer: Self-pay | Admitting: Pulmonary Disease

## 2021-09-22 VITALS — BP 116/64 | HR 61 | Temp 98.5°F | Ht 61.0 in | Wt 122.2 lb

## 2021-09-22 DIAGNOSIS — R9389 Abnormal findings on diagnostic imaging of other specified body structures: Secondary | ICD-10-CM

## 2021-09-22 DIAGNOSIS — R948 Abnormal results of function studies of other organs and systems: Secondary | ICD-10-CM | POA: Diagnosis not present

## 2021-09-22 DIAGNOSIS — R590 Localized enlarged lymph nodes: Secondary | ICD-10-CM | POA: Diagnosis not present

## 2021-09-22 DIAGNOSIS — R599 Enlarged lymph nodes, unspecified: Secondary | ICD-10-CM

## 2021-09-22 DIAGNOSIS — Z87891 Personal history of nicotine dependence: Secondary | ICD-10-CM

## 2021-09-22 NOTE — Patient Instructions (Addendum)
Thank you for visiting Dr. Valeta Harms at [facility]. Today we recommend the following:  ILD questionaire  PFTs prior to next office visit   Return for Next available with Dr. Chase Caller .    Please do your part to reduce the spread of COVID-19.

## 2021-09-22 NOTE — Progress Notes (Signed)
PCCM attending:  This is a 71 year old female who has a history of COPD, history of possible granulomatous disease.  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  This was unclear as to the etiology.  Mediastinal adenopathy evaluated with CT and ultimately PET scan which revealed hypermetabolic disease she was taken for bronchoscopy with biopsy flow cytometry was negative tissue sampling of the nodes were negative.  No documentation of granulomas.  However clinical scenario looks like sarcoidosis.  Follow-up here today to review her CT imaging that shows some progression of parenchymal disease.  At baseline she does state today that she has had increased shortness of breath when she exerts herself.  BP 116/64 (BP Location: Left Arm, Patient Position: Sitting, Cuff Size: Normal)   Pulse 61   Temp 98.5 F (36.9 C) (Oral)   Ht '5\' 1"'$  (1.549 m)   Wt 122 lb 3.2 oz (55.4 kg)   SpO2 91%   BMI 23.09 kg/m   General: Elderly female resting comfortably no acute distress HEENT: Tracking appropriately NCAT Heart: Regular rhythm S1-S2 Lungs: Crackles faint inspiratory bilaterally in the bases Abdomen: Soft nontender Extremities: No significant edema  CT imaging: Recent HRCT imaging with evidence of progression of parenchymal disease.  Worsening interstitial lung disease. The patient's images have been independently reviewed by me.    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  Plan: Refer to Dr. Chase Caller to discuss abnormalities in CT.  I think we need to weigh the pros and cons of consideration of mediastinoscopy with nodal sampling to confirm granulomatous disease.  Or should we treat empirically with immune suppression.  Since she has had change in her CT  imaging we will get pulmonary function test. We will also give her an ILD packet to fill out prior to seeing Dr. Chase Caller.  I agree with the documentation from Farrel Gordon, DO PGY 2.  I spent 42 minutes dedicated to the care of this patient on the date of this encounter to include pre-visit review of records, face-to-face time with the patient discussing conditions above, post visit ordering of testing, clinical documentation with the electronic health record, making appropriate referrals as documented, and communicating necessary findings to members of the patients care team.    Garner Nash, [facility] 09/22/2021 11:25 AM

## 2021-09-22 NOTE — Progress Notes (Signed)
Synopsis: Referred in December 2022 for lung nodule by Mosie Lukes, MD  Subjective:   PATIENT ID: Danielle Fox GENDER: female DOB: 10/16/1950, MRN: [id number]  Chief Complaint  Patient presents with   Follow-up    Pt is here for follow up for adenopathy. Pt states that she is doing well. Pt is on Symbicort daily with no issues noted from patient.      This is a 71 year old female, past medical history of COPD, prior positive PPD.Present for evaluation of lung nodule.  Patient was enrolled in lung cancer screening program had an abnormal lung cancer screening CT on 01/12/2021.  Had mild associated mediastinal adenopathy she had multiple small but hypermetabolic mediastinal and hilar nodes.  Additionally a small right supraclavicular cervical and left axillary node.  Other small bilateral multiple pulmonary nodules.  Concerning for possible underlying granulomatous disease.  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 Felecia Shelling a few years prior.  She has subsequently recovered from this.  She also had a recent URI and was treated with antibiotics and steroids.  This is getting better.  OV 03/13/2021: Here today for follow-up after bronchoscopy.  Patient was taken for bronchoscopy following nuclear medicine pet imaging which revealed hypermetabolic adenopathy within the chest.  Tissue sampling of the lymph nodes were all negative.  Flow cytometry also revealed no polyclonal or monoclonal proliferations of lymphocytes.  No evidence of lymphoma.  There was also no granulomatous disease noted.  We reviewed her pathology results today in the office which all suggest a reactive adenopathy.  OV 06/01/2021: Here today for follow-up after recent CT scan of the chest.  Thankfully lymphadenopathy is stable.  She otherwise feels great.  Leaving soon to go spend a couple of weeks and [place] with family.  From respiratory standpoint she is able to get around do everything that  she wants to do.  She was happy to receive the news.  Also discussed importance of repeat follow-up imaging with prominent adenopathy.  I do suspect she is probably dealing with sarcoidosis and we just did not make a diagnosis on last bronchoscopy.  As long as her lymph nodes are stable I think we can avoid repeat procedure versus even consideration for mediastinoscopy.  We talked about this today in the office.  OV 09/22/2021: Here today for follow-up after repeat CT chest to follow lymphadenopathy and progressive fibrosis. Lymphadenopathy is unchanged from prior however overall shows progression of fibrosis from initial scans in 2022. She does report increased exertional dyspnea at this time and uses albuterol inhaler twice daily now, was previously using this just once. She notes that between the heat which exacerbates her dyspnea and the recent increase in environmental trigger ([place] smoke) it has seemed worse. Overall clinical suspicion still remains high for sarcoidosis though we have not been able to confirm this at this point. We discussed a referral to the ILD clinic for further recommendations re: treating prophylactically with immune modulators, anti-fibrotic medications versus further work-up which she is in agreement with.    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   Preventative health care 06/29/2016   Pruritus 06/29/2016   Pyloric stenosis in adult 05/2019  Welcome to Medicare preventive visit 06/29/2016     Family History  Problem Relation Age of Onset   Heart disease Mother        cabg, MI   Alcohol abuse Father    Cirrhosis Father    Heart disease Father        MI   Colon cancer Neg Hx    Colon polyps Neg Hx    Esophageal cancer Neg Hx     Stomach cancer Neg Hx    Rectal cancer Neg Hx      Past Surgical History:  Procedure Laterality Date   ABDOMINAL HYSTERECTOMY     APPENDECTOMY     COLONOSCOPY     01-14-2005   DILATION AND CURETTAGE OF UTERUS     FINE NEEDLE ASPIRATION  02/27/2021   Procedure: FINE NEEDLE ASPIRATION (FNA) LINEAR;  Surgeon: Garner Nash, DO;  Location: [facility];  Service: Pulmonary;;   SINUS IRRIGATION  04/02/2015   TUBAL LIGATION     UPPER GASTROINTESTINAL ENDOSCOPY  11/22/2018   VIDEO BRONCHOSCOPY WITH ENDOBRONCHIAL ULTRASOUND Bilateral 02/27/2021   Procedure: VIDEO BRONCHOSCOPY WITH ENDOBRONCHIAL ULTRASOUND;  Surgeon: Garner Nash, DO;  Location: [facility];  Service: Pulmonary;  Laterality: Bilateral;    Social History   Socioeconomic History   Marital status: Married    Spouse name: Not on file   Number of children: 3   Years of education: Not on file   Highest education level: Not on file  Occupational History   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[employer], retired    Fish farm manager: [employer]  Tobacco Use   Smoking status: Former    Packs/day: 1.00    Years: 48.00    Total pack years: 48.00    Types: Cigarettes    Quit date: 02/19/2014    Years since quitting: 7.5   Smokeless tobacco: Never   Tobacco comments:    Quit and has no desire to smoke again,  Vaping Use   Vaping Use: Never used  Substance and Sexual Activity   Alcohol use: Yes    Alcohol/week: 0.0 standard drinks of alcohol    Comment: socially   Drug use: No   Sexual activity: Yes    Partners: Male  Other Topics Concern   Not on file  Social History Narrative   Exercise-- walks dog on occasion   Right Handed   One Story Home   Does not drink caffeine    Social Determinants of Health   Financial Resource Strain: Low Risk  (07/16/2021)   Overall Financial Resource Strain (CARDIA)    Difficulty of Paying Living Expenses: Not hard at all  Food Insecurity: No Food Insecurity (07/16/2021)   Hunger Vital Sign    Worried  About Running Out of Food in the Last Year: Never true    Ran Out of Food in the Last Year: Never true  Transportation Needs: No Transportation Needs (07/16/2021)   PRAPARE - Hydrologist (Medical): No    Lack of Transportation (Non-Medical): No  Physical Activity: Sufficiently Active (07/16/2021)   Exercise Vital Sign    Days of Exercise per Week: 7 days    Minutes of Exercise per Session: 60 min  Stress: No Stress Concern Present (07/16/2021)   [place]    Feeling of Stress : Not at all  Social Connections: [place] (07/16/2021)   Social Connection and Isolation Panel [NHANES]    Frequency of Communication with Friends  and Family: More than three times a week    Frequency of Social Gatherings with Friends and Family: More than three times a week    Attends Religious Services: More than 4 times per year    Active Member of [employer] or Organizations: Yes    Attends Music therapist: More than 4 times per year    Marital Status: Married  Human resources officer Violence: Not At Risk (07/16/2021)   Humiliation, Afraid, Rape, and Kick questionnaire    Fear of Current or Ex-Partner: No    Emotionally Abused: No    Physically Abused: No    Sexually Abused: No     Allergies  Allergen Reactions   Doxycycline Nausea And Vomiting   Vancomycin     Nausea and abdominal pain   Erythromycin Base Nausea Only     Outpatient Medications Prior to Visit  Medication Sig Dispense Refill   budesonide-formoterol (SYMBICORT) 160-4.5 MCG/ACT inhaler INHALE 2 PUFFS INTO THE LUNGS IN THE MORNING AND AT BEDTIME (Patient taking differently: Inhale 2 puffs into the lungs 2 (two) times daily as needed (asthma).) 10.2 g 5   esomeprazole (NEXIUM) 40 MG capsule Take 1 capsule (40 mg total) by mouth daily before breakfast. 90 capsule 3   Multiple Vitamins-Minerals (MULTIVITAMIN WITH MINERALS) tablet Take 1  tablet by mouth daily.     No facility-administered medications prior to visit.    Review of Systems  Constitutional:  Negative for chills, fever, malaise/fatigue and weight loss.  Respiratory:  Negative for cough, hemoptysis, sputum production, shortness of breath, wheezing and stridor.   Cardiovascular:  Negative for chest pain, palpitations, orthopnea, leg swelling and PND.  Neurological:  Negative for focal weakness.  Endo/Heme/Allergies:  Negative for environmental allergies.  All other systems reviewed and are negative.    Objective:  Constitutional:Pleasant, well-appearing female in no acute distress. Cardio:Regular rate and rhythm. No murmurs, rubs, gallops. Pulm:Inspiratory crackles appreciated. GOT:LXBWIOMB for extremity edema. Skin:Warm and dry. Neuro:Awake, alert, no focal deficit noted. Psych:Pleasant mood and affect.  Vitals:   09/22/21 1034  BP: 116/64  Pulse: 61  Temp: 98.5 F (36.9 C)  TempSrc: Oral  SpO2: 91%  Weight: 122 lb 3.2 oz (55.4 kg)  Height: _0  (1.549 m)   91% on RA BMI Readings from Last 3 Encounters:  09/22/21 23.09 kg/m  07/16/21 22.75 kg/m  06/09/21 23.13 kg/m   Wt Readings from Last 3 Encounters:  09/22/21 122 lb 3.2 oz (55.4 kg)  07/16/21 120 lb 6.4 oz (54.6 kg)  06/09/21 122 lb 6.4 oz (55.5 kg)     CBC    Component Value Date/Time   WBC 12.9 (H) 02/27/2021 0553   RBC 4.77 02/27/2021 0553   HGB 13.9 02/27/2021 0553   HCT 43.4 02/27/2021 0553   PLT 201 02/27/2021 0553   MCV 91.0 02/27/2021 0553   MCH 29.1 02/27/2021 0553   MCHC 32.0 02/27/2021 0553   RDW 14.5 02/27/2021 0553   LYMPHSABS 1.6 12/02/2020 1123   MONOABS 0.7 12/02/2020 1123   EOSABS 1.9 (H) 12/02/2020 1123   BASOSABS 0.1 12/02/2020 1123     Chest Imaging: Nuclear medicine pet imaging11/29/2022: Patient has significant hypermetabolic adenopathy within the chest all within the mediastinum and bilateral hilum concerning for granulomatous disease versus  sarcoidosis. The patient's images have been independently reviewed by me.    CT chest February 2023: Stable lymphadenopathy. The patient's images have been independently reviewed by me.    CT chest July 2023: Stable lymphadenopathy. Progressive  pattern of fibrosis when compared to 12/2020, but stable compared to 04/2021.   Pulmonary Functions Testing Results:     No data to display           FeNO:   Pathology:   FINAL MICROSCOPIC DIAGNOSIS:   A. LYMPH NODE, STATION 7, FINE NEEDLE ASPIRATION:  -  Limited specimen with floridly reactive lymphoid tissue  -  No granulomas identified  -  No carcinoma identified   COMMENT:   Flow cytometry performed on the sample ([id number]) did not identify a  monoclonal B-cell or phenotypically aberrant T-cell population.  Immunohistochemistry was performed on the cellblock.  By  immunohistochemistry, CD20 and CD3 highlight an admixture of B and T  cells.  CD10 and BCL-6 highlight germinal centers which are negative for  Bcl-2.  Ki-67 shows increased proliferation within the germinal centers.  B cells do not coexpress CD5 or cyclin D1.  CD56 and Cytokeratin AE1/3  are negative.   Overall, there is no morphologic or immunophenotypic evidence of a  lymphoproliferative process or carcinoma in the examined material.  If  clinical concern persists for a lymphoproliferative process, excisional  biopsy is recommended.   Echocardiogram:   Heart Catheterization:     Assessment & Plan:     ICD-10-CM   1. Adenopathy  R59.9 Pulmonary Function Test    2. Hilar adenopathy  R59.0 Pulmonary Function Test    3. Mediastinal adenopathy  R59.0     4. Abnormal CT scan  R93.89 Pulmonary Function Test    5. Abnormal PET scan of mediastinum  R94.8     6. Former smoker  Z87.891       Discussion: Patient presents today for review of recent CT chest. As above she is noted to have stable lymphadenopathy, however overall the fibrotic pattern of her  parenchyma has progressed when compared to 12/2020. She has had progression of exertional dyspnea as well, now requiring albuterol use twice daily where formerly it was very rare. At this point there is concern that, though stable from last scan 04/2021, there is a progressive pattern in pulmonary fibrosis pattern. She is very active and well-appearing at 71 years old and would benefit from further recommendations regarding prophylactic treatment for presumed sarcoidosis versus further evaluation.  -PFTs -Referral to ILD clinic   Current Outpatient Medications:    budesonide-formoterol (SYMBICORT) 160-4.5 MCG/ACT inhaler, INHALE 2 PUFFS INTO THE LUNGS IN THE MORNING AND AT BEDTIME (Patient taking differently: Inhale 2 puffs into the lungs 2 (two) times daily as needed (asthma).), Disp: 10.2 g, Rfl: 5   esomeprazole (NEXIUM) 40 MG capsule, Take 1 capsule (40 mg total) by mouth daily before breakfast., Disp: 90 capsule, Rfl: 3   Multiple Vitamins-Minerals (MULTIVITAMIN WITH MINERALS) tablet, Take 1 tablet by mouth daily., Disp: , Rfl:     Farrel Gordon, DO [facility] 09/22/2021 11:23 AM

## 2021-09-23 ENCOUNTER — Telehealth: Payer: Self-pay | Admitting: Internal Medicine

## 2021-09-23 NOTE — Telephone Encounter (Signed)
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 Patient Danielle Fox has PFT Mid aug 2023. Please give her ILD questions. How soon can get you get her to see me ? First avail 30 min please  Thanks    SIGNATURE    Dr. Brand Males, M.D., F.C.C.P,  Pulmonary and Critical Care Medicine Staff Physician, [place] Director - Interstitial Lung Disease  Program  Medical Director - [facility] [place] at [place], [place], [address]  NPI Number:  NPI #[id number] [facility] Number: [id number]  Pager: [phone], If no answer  -> Check AMION or Try Hand Telephone (clinical office): [phone] Telephone (research): [phone]  8:30 AM 09/23/2021

## 2021-09-23 NOTE — Telephone Encounter (Signed)
Pt scheduled to see MR on 8/17. ILD packet mailed.

## 2021-10-12 ENCOUNTER — Other Ambulatory Visit: Payer: Self-pay | Admitting: *Deleted

## 2021-10-12 MED ORDER — BUDESONIDE-FORMOTEROL FUMARATE 160-4.5 MCG/ACT IN AERO: 2.0000 | INHALATION_SPRAY | Freq: Two times a day (BID) | RESPIRATORY_TRACT | 5 refills | Status: DC

## 2021-10-14 ENCOUNTER — Ambulatory Visit (INDEPENDENT_AMBULATORY_CARE_PROVIDER_SITE_OTHER): Payer: Medicare Other | Admitting: Pulmonary Disease

## 2021-10-14 DIAGNOSIS — R590 Localized enlarged lymph nodes: Secondary | ICD-10-CM

## 2021-10-14 DIAGNOSIS — R9389 Abnormal findings on diagnostic imaging of other specified body structures: Secondary | ICD-10-CM

## 2021-10-14 DIAGNOSIS — R599 Enlarged lymph nodes, unspecified: Secondary | ICD-10-CM

## 2021-10-14 NOTE — Patient Instructions (Signed)
Full PFT Performed Today  

## 2021-10-14 NOTE — Progress Notes (Signed)
Full PFT Performed Today  

## 2021-10-15 ENCOUNTER — Encounter: Payer: Self-pay | Admitting: Internal Medicine

## 2021-10-15 ENCOUNTER — Ambulatory Visit: Payer: Medicare Other | Admitting: Internal Medicine

## 2021-10-15 VITALS — BP 100/70 | HR 77 | Ht 61.0 in | Wt 121.4 lb

## 2021-10-15 DIAGNOSIS — R590 Localized enlarged lymph nodes: Secondary | ICD-10-CM | POA: Diagnosis not present

## 2021-10-15 DIAGNOSIS — R948 Abnormal results of function studies of other organs and systems: Secondary | ICD-10-CM | POA: Diagnosis not present

## 2021-10-15 DIAGNOSIS — J849 Interstitial pulmonary disease, unspecified: Secondary | ICD-10-CM

## 2021-10-15 DIAGNOSIS — R599 Enlarged lymph nodes, unspecified: Secondary | ICD-10-CM

## 2021-10-15 DIAGNOSIS — J449 Chronic obstructive pulmonary disease, unspecified: Secondary | ICD-10-CM

## 2021-10-15 DIAGNOSIS — Z87891 Personal history of nicotine dependence: Secondary | ICD-10-CM

## 2021-10-15 DIAGNOSIS — Z7184 Encounter for health counseling related to travel: Secondary | ICD-10-CM

## 2021-10-15 MED ORDER — NIRMATRELVIR/RITONAVIR (PAXLOVID)TABLET
3.0000 | ORAL_TABLET | Freq: Two times a day (BID) | ORAL | 0 refills | Status: AC
Start: 2021-10-15 — End: 2021-10-20

## 2021-10-15 MED ORDER — DOXYCYCLINE HYCLATE 100 MG PO TABS: 100.0000 mg | ORAL_TABLET | Freq: Two times a day (BID) | ORAL | 0 refills | Status: DC

## 2021-10-15 MED ORDER — SPIRIVA RESPIMAT 1.25 MCG/ACT IN AERS: 2.0000 | INHALATION_SPRAY | Freq: Every day | RESPIRATORY_TRACT | 0 refills | Status: DC

## 2021-10-15 MED ORDER — PREDNISONE 10 MG PO TABS
ORAL_TABLET | ORAL | 0 refills | Status: AC
Start: 2021-10-15 — End: 2021-10-19

## 2021-10-15 NOTE — Patient Instructions (Addendum)
ICD-10-CM   1. Hilar adenopathy  R59.0     2. Adenopathy  R59.9     3. Mediastinal adenopathy  R59.0     4. Abnormal PET scan of mediastinum  R94.8     5. Former smoker  Z87.891     2. ILD (interstitial lung disease) ([place])  J84.9     7. Chronic obstructive pulmonary disease, unspecified COPD type ([place])  J44.9     8. Travel advice encounter  Z71.84      Hilar adenopathy Adenopathy Mediastinal adenopathy Abnormal PET scan of mediastinum Nov 2022. Non diagnostic bx Jan 2023 Former smoker  Plan  - repeat PET scan Nov 2023 (1 year)  ILD (interstitial lung disease) ([place]) - seen Feb 2023 and 2021 but not in 2019. Mild but progressive  Pla  - repeat spiro/dlco In 3 months Nov 2023 - adminster ILD   Chronic obstructive pulmonary disease, unspecified COPD type ([place]); Moderate  Plan  - continue symbicort scheduled with albuterol as needed - during travel to [place] use spiriva scheduled samples on top of above  Travel advice encounter - [place] Sept 2023   Plan - take for travel  - Take doxycycline '100mg'$  po twice daily x 5 days; take after meals and avoid sunlight - Please take prednisone 40 mg x1 day, then 30 mg x1 day, then 20 mg x1 day, then 10 mg x1 day, and then 5 mg x1 day and stop - PAxlovid regular dose course - avoid respitaory viral infections and blood clot and heat illness   Followup  Nov 2023 - 30 mint visit but after PFT and PET Scan

## 2021-10-15 NOTE — Progress Notes (Signed)
IOV Dr Valeta Harms Dec 2022   This is a 71 year old female, past medical history of COPD, prior positive PPD.Present for evaluation of lung nodule.  Patient was enrolled in lung cancer screening program had an abnormal lung cancer screening CT on 01/12/2021.  Had mild associated mediastinal adenopathy she had multiple small but hypermetabolic mediastinal and hilar nodes.  Additionally a small right supraclavicular cervical and left axillary node.  Other small bilateral multiple pulmonary nodules.  Concerning for possible underlying granulomatous disease.  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 Felecia Shelling a few years prior.  She has subsequently recovered from this.  She also had a recent URI and was treated with antibiotics and steroids.  This is getting better.   OV 03/13/2021: Here today for follow-up after bronchoscopy.  Patient was taken for bronchoscopy following nuclear medicine pet imaging which revealed hypermetabolic adenopathy within the chest.  Tissue sampling of the lymph nodes were all negative.  Flow cytometry also revealed no polyclonal or monoclonal proliferations of lymphocytes.  No evidence of lymphoma.  There was also no granulomatous disease noted.  We reviewed her pathology results today in the office which all suggest a reactive adenopathy.   OV 06/01/2021: Here today for follow-up after recent CT scan of the chest.  Thankfully lymphadenopathy is stable.  She otherwise feels great.  Leaving soon to go spend a couple of weeks and [place] with family.  From respiratory standpoint she is able to get around do everything that she wants to do.  She was happy to receive the news.  Also discussed importance of repeat follow-up imaging with prominent adenopathy.  I do suspect she is probably dealing with sarcoidosis and we just did not make a diagnosis on last bronchoscopy.  As long as her lymph nodes are stable I think we can avoid repeat procedure versus even  consideration for mediastinoscopy.  We talked about this today in the office.   OV 09/22/2021: Here today for follow-up after repeat CT chest to follow lymphadenopathy and progressive fibrosis. Lymphadenopathy is unchanged from prior however overall shows progression of fibrosis from initial scans in 2022. She does report increased exertional dyspnea at this time and uses albuterol inhaler twice daily now, was previously using this just once. She notes that between the heat which exacerbates her dyspnea and the recent increase in environmental trigger ([place] smoke) it has seemed worse. Overall clinical suspicion still remains high for sarcoidosis though we have not been able to confirm this at this point. We discussed a referral to the ILD clinic for further recommendations re: treating prophylactically with immune modulators, anti-fibrotic medications versus further work-up which she is in agreement with.       OV 10/15/2021 - transfe of care from Dr Valeta Harms to DR Chase Caller  Subjective:  Patient ID: Danielle Fox, female , DOB: [birth year] , age 71 y.o. , MRN: [id number] , ADDRESS: [place] [address] PCP Mosie Lukes, MD Patient Care Team: Mosie Lukes, MD as PCP - General (Family Medicine)  This Provider for this visit: Treatment Team:  Attending Provider: Brand Males, MD    10/15/2021 -   Chief Complaint  Patient presents with   Consult    Pt had recent CT performed and is here today to have the results discussed. Pt denies any current complaints of cough, SOB, or chest discomfort.     HPI Adeana M Stoney 71 y.o. -reports that 2 years ago had encephalitis (reivew  of chart suggest stroke). Prior smoker. [place] Parents smoked heavily.  Says in after mtath of neuro issue CT was abnormal. Diffuse adenopathy esp mediastinal Nov 2022 - now non diagnostic early 2023. Consideration of sarcoid  On followup. Dr Valeta Harms has  reocmmended followup PET. Had CT - shows ILD - in my view very mild and c/w indeterminate for UIP pattern. SErioogy negative. Has only mild DOE and somecough. Uses cymbicort. PFT shows obsstruction interestingly with BD response and normal DLCO . Cough is mild only. She has upcoming travel with family to [place] Sept 2023 and wants to hold off majore workup     Simple office walk 185 feet x  3 laps goal with forehead probe 10/15/2021    O2 used ra   Number laps completed 3   Comments about pace avg   Resting Pulse Ox/HR 100% and 77/min   Final Pulse Ox/HR 97% and 98/min   Desaturated </= 88% no   Desaturated <= 3% points yes   Got Tachycardic >/= 90/min yes   Symptoms at end of test No complaints   Miscellaneous comments x    CT chest 09/07/21  Narrative & Impression  CLINICAL DATA:  Abnormal CT chest. Evaluate for interstitial lung disease.   EXAM: CT CHEST WITHOUT CONTRAST   TECHNIQUE: Multidetector CT imaging of the chest was performed following the standard protocol without intravenous contrast. High resolution imaging of the lungs, as well as inspiratory and expiratory imaging, was performed.   RADIATION DOSE REDUCTION: This exam was performed according to the departmental dose-optimization program which includes automated exposure control, adjustment of the mA and/or kV according to patient size and/or use of iterative reconstruction technique.   COMPARISON:  04/20/2021, PET 01/27/2021, CT chest 01/12/2021 and 09/29/2019.   FINDINGS: Cardiovascular: Atherosclerotic calcification of the aorta. Heart size normal. No pericardial effusion.   Mediastinum/Nodes: Mediastinal adenopathy is again seen with an index low right paratracheal lymph node measuring 11 mm, as before. Hilar regions are difficult to evaluate without IV contrast. No axillary adenopathy. Esophagus is grossly unremarkable.   Lungs/Pleura: Biapical pleuroparenchymal scarring. Centrilobular  and paraseptal emphysema. Patchy pulmonary parenchymal ground-glass is predominantly peripheral in location and without a definite craniocaudal gradient. A few scattered subpleural reticular densities are seen. Mild traction bronchiolectasis. Possible early honeycombing in the posterior right lower lobe (7/77), versus paraseptal emphysema. Findings are similar to 04/20/2021 but progressive from 01/12/2021. Millimetric pulmonary nodules, as before. No suspicious pulmonary nodules. Scattered mucoid impaction. No pleural fluid. Airway is unremarkable. No air trapping.   Upper Abdomen: Visualized portions of the liver, gallbladder, adrenal glands, kidneys, spleen, pancreas, stomach and bowel are grossly unremarkable. Upper abdominal lymph nodes are not enlarged by CT size criteria.   Musculoskeletal: Mild degenerative changes in the spine. Old left rib fracture. No worrisome lytic or sclerotic lesions.   IMPRESSION: 1. Pulmonary parenchymal pattern of fibrosis appears similar to 04/20/2021 but is progressive from 01/12/2021. Findings may be due to sarcoid. Nonspecific interstitial pneumonitis is considered less likely given progression. Findings are indeterminate for UIP per consensus guidelines: Diagnosis of Idiopathic Pulmonary Fibrosis: An Official ATS/ERS/JRS/ALAT Clinical Practice Guideline. [place], Iss 5, [phone], Oct 30 2016. 2. Mediastinal adenopathy, possibly due to sarcoid and previously evaluated on PET 01/27/2021. 3.  Aortic atherosclerosis (ICD10-I70.0). 4.  Emphysema (ICD10-J43.9).     Electronically Signed   By: Lorin Picket M.D.   On: 09/07/2021 16:38  PFT     Latest Ref Rng & Units 10/14/2021   12:39 PM  PFT Results  FVC-Predicted Pre % 79  P  FVC-Post L 2.17  P  FVC-Predicted Post % 85  P  Pre FEV1/FVC % % 61  P  Post FEV1/FCV % % 63  P  FEV1-Pre L 1.24  P  FEV1-Predicted Pre % 65  P  FEV1-Post L 1.37  P  DLCO  uncorrected ml/min/mmHg 14.53  P  DLCO UNC% % 84  P  DLCO corrected ml/min/mmHg 14.53  P  DLCO COR %Predicted % 84  P  DLVA Predicted % 92  P  TLC L 4.76  P  TLC % Predicted % 105  P  RV % Predicted % 131  P    P Preliminary result    Latest Reference Range & Units 02/04/20 11:35  ANCA Proteinase 3 0.0 - 3.5 U/mL <3.5  Atypical pANCA Neg:<1:20 titer <1:20  Myeloperoxidase Ab 0.0 - 9.0 U/mL <9.0  Cytoplasmic (C-ANCA) Neg:<1:20 titer <1:20  P-ANCA Neg:<1:20 titer <1:20    Latest Reference Range & Units 09/29/19 11:12 09/29/19 11:44 09/30/19 10:16 10/01/19 08:38 10/02/19 14:14 02/04/20 11:35  Anti Nuclear Antibody (ANA) Negative   Negative      ANCA Proteinase 3 0.0 - 3.5 U/mL      <3.5  Angiotensin-Converting Enzyme 14 - 82 U/L '23  21 22    '$ Atypical pANCA Neg:<1:20 titer      <1:20  Myeloperoxidase Ab 0.0 - 9.0 U/mL      <9.0  RA Latex Turbid. 0.0 - 13.9 IU/mL    <10.0    Cytoplasmic (C-ANCA) Neg:<1:20 titer    <1:20  <1:20  P-ANCA Neg:<1:20 titer    <1:20  <1:20  Atypical P-ANCA titer Neg:<1:20 titer    <1:20    Anti-Hu Ab Negative      Negative   Anti-Ri Ab Negative      Negative   Anti-Yo Ab Negative      Negative       has a past medical history of Allergy, Clostridioides difficile infection (10/19/2018), Colon polyps, Constipation (10/06/2014), COPD (chronic obstructive pulmonary disease) ([place]), Osteopenia, Positive TB test, Preventative health care (06/29/2016), Pruritus (06/29/2016), Pyloric stenosis in adult (05/2019), and Welcome to Medicare preventive visit (06/29/2016).   reports that she quit smoking about 7 years ago. Her smoking use included cigarettes. She has a 48.00 pack-year smoking history. She has never used smokeless tobacco.  Past Surgical History:  Procedure Laterality Date   ABDOMINAL HYSTERECTOMY     APPENDECTOMY     COLONOSCOPY     01-14-2005   DILATION AND CURETTAGE OF UTERUS     FINE NEEDLE ASPIRATION  02/27/2021   Procedure: FINE NEEDLE ASPIRATION  (FNA) LINEAR;  Surgeon: Garner Nash, DO;  Location: [facility];  Service: Pulmonary;;   SINUS IRRIGATION  04/02/2015   TUBAL LIGATION     UPPER GASTROINTESTINAL ENDOSCOPY  11/22/2018   VIDEO BRONCHOSCOPY WITH ENDOBRONCHIAL ULTRASOUND Bilateral 02/27/2021   Procedure: VIDEO BRONCHOSCOPY WITH ENDOBRONCHIAL ULTRASOUND;  Surgeon: Garner Nash, DO;  Location: [facility];  Service: Pulmonary;  Laterality: Bilateral;    Allergies  Allergen Reactions   Doxycycline Nausea And Vomiting   Vancomycin     Nausea and abdominal pain   Erythromycin Base Nausea Only    Immunization History  Administered Date(s) Administered   Fluad Quad(high Dose 65+) 01/29/2019, 12/28/2019, 12/02/2020   Influenza Split 01/07/2011   Influenza Whole 11/29/2009  Influenza, High Dose Seasonal PF 11/07/2017   Influenza,inj,Quad PF,6+ Mos 12/14/2012   Influenza-Unspecified 12/28/2013, 12/29/2014, 12/01/2015, 12/25/2016   PFIZER(Purple Top)SARS-COV-2 Vaccination 05/06/2019, 06/06/2019, 12/15/2019   Pneumococcal Conjugate-13 06/29/2016   Tdap 03/01/2001, 06/29/2016   Zoster Recombinat (Shingrix) 03/07/2018    Family History  Problem Relation Age of Onset   Heart disease Mother        cabg, MI   Alcohol abuse Father    Cirrhosis Father    Heart disease Father        MI   Colon cancer Neg Hx    Colon polyps Neg Hx    Esophageal cancer Neg Hx    Stomach cancer Neg Hx    Rectal cancer Neg Hx      Current Outpatient Medications:    budesonide-formoterol (SYMBICORT) 160-4.5 MCG/ACT inhaler, Inhale 2 puffs into the lungs 2 (two) times daily. in the morning and at bedtime., Disp: 10.2 g, Rfl: 5   doxycycline (VIBRA-TABS) 100 MG tablet, Take 1 tablet (100 mg total) by mouth 2 (two) times daily., Disp: 10 tablet, Rfl: 0   esomeprazole (NEXIUM) 40 MG capsule, Take 1 capsule (40 mg total) by mouth daily before breakfast., Disp: 90 capsule, Rfl: 3   Multiple Vitamins-Minerals (MULTIVITAMIN WITH MINERALS)  tablet, Take 1 tablet by mouth daily., Disp: , Rfl:    nirmatrelvir/ritonavir EUA (PAXLOVID) 20 x 150 MG & 10 x '100MG'$  TABS, Take 3 tablets by mouth 2 (two) times daily for 5 days. Patient GFR is 59. Take nirmatrelvir (150 mg) two tablets twice daily for 5 days and ritonavir (100 mg) one tablet twice daily for 5 days., Disp: 30 tablet, Rfl: 0   predniSONE (DELTASONE) 10 MG tablet, Take 4 tablets (40 mg total) by mouth daily with breakfast for 1 day, THEN 3 tablets (30 mg total) daily with breakfast for 1 day, THEN 2 tablets (20 mg total) daily with breakfast for 1 day, THEN 1 tablet (10 mg total) daily with breakfast for 1 day, THEN 0.5 tablets (5 mg total) daily with breakfast for 1 day., Disp: 10.5 tablet, Rfl: 0   Tiotropium Bromide Monohydrate (SPIRIVA RESPIMAT) 1.25 MCG/ACT AERS, Inhale 2 puffs into the lungs daily., Disp: 4 g, Rfl: 0      Objective:   Vitals:   10/15/21 0923  BP: 100/70  Pulse: 77  SpO2: 100%  Weight: 121 lb 6.4 oz (55.1 kg)  Height: '5\' 1"'$  (1.549 m)    Estimated body mass index is 22.94 kg/m as calculated from the following:   Height as of this encounter: '5\' 1"'$  (1.549 m).   Weight as of this encounter: 121 lb 6.4 oz (55.1 kg).  '@WEIGHTCHANGE'$ @  [employer]   10/15/21 0923  Weight: 121 lb 6.4 oz (55.1 kg)     Physical Exam    General: No distress. Looks well Neuro: Alert and Oriented x 3. GCS 15. Speech normal Psych: Pleasant Resp:  Barrel Chest - no.  Wheeze - no, Crackles - no, No overt respiratory distress CVS: Normal heart sounds. Murmurs - no Ext: Stigmata of Connective Tissue Disease - no HEENT: Normal upper airway. PEERL +. No post nasal drip        Assessment:       ICD-10-CM   1. Hilar adenopathy  R59.0 NM PET Image Restage (PS) Skull Base to Thigh (F-18 FDG)    Pulmonary function test    2. Adenopathy  R59.9 NM PET Image Restage (PS) Skull Base to Thigh (F-18 FDG)  Pulmonary function test    3. Mediastinal adenopathy  R59.0 NM  PET Image Restage (PS) Skull Base to Thigh (F-18 FDG)    Pulmonary function test    4. Abnormal PET scan of mediastinum  R94.8 NM PET Image Restage (PS) Skull Base to Thigh (F-18 FDG)    5. Former smoker  Z87.891     35. ILD (interstitial lung disease) ([place])  J84.9 Pulmonary function test    7. Chronic obstructive pulmonary disease, unspecified COPD type ([place])  J44.9     8. Travel advice encounter  Z71.84          Plan:     Patient Instructions     ICD-10-CM   1. Hilar adenopathy  R59.0     2. Adenopathy  R59.9     3. Mediastinal adenopathy  R59.0     4. Abnormal PET scan of mediastinum  R94.8     5. Former smoker  Z87.891     51. ILD (interstitial lung disease) ([place])  J84.9     7. Chronic obstructive pulmonary disease, unspecified COPD type ([place])  J44.9     8. Travel advice encounter  Z71.84      Hilar adenopathy Adenopathy Mediastinal adenopathy Abnormal PET scan of mediastinum Nov 2022. Non diagnostic bx Jan 2023 Former smoker  Plan  - repeat PET scan Nov 2023 (1 year)  ILD (interstitial lung disease) ([place]) - seen Feb 2023 and 2021 but not in 2019. Mild but progressive  Pla  - repeat spiro/dlco In 3 months Nov 2023  Chronic obstructive pulmonary disease, unspecified COPD type ([place]); Moderate  Plan  - continue symbicort scheduled with albuterol as needed - during travel to [place] use spiriva scheduled samples on top of above  Travel advice encounter - [place] Sept 2023   Plan - take for travel  - Take doxycycline '100mg'$  po twice daily x 5 days; take after meals and avoid sunlight - Please take prednisone 40 mg x1 day, then 30 mg x1 day, then 20 mg x1 day, then 10 mg x1 day, and then 5 mg x1 day and stop - PAxlovid regular dose course - avoid respitaory viral infections and blood clot and heat illness   Followup  Nov 2023 - 30 mint visit but after PFT and PET Scan  ( Level 05 visit: Estb 40-54 min  in  visit type: on-site physical face to visit  in  total care time and counseling or/and coordination of care by this undersigned MD - Dr Brand Males. This includes one or more of the following on this same day 10/15/2021: pre-charting, chart review, note writing, documentation discussion of test results, diagnostic or treatment recommendations, prognosis, risks and benefits of management options, instructions, education, compliance or risk-factor reduction. It excludes time spent by the [place] or office staff in the care of the patient. Actual time 46 min)   SIGNATURE    Dr. Brand Males, M.D., F.C.C.P,  Pulmonary and Critical Care Medicine Staff Physician, [place] Director - Interstitial Lung Disease  Program  Pulmonary [facility] at [place], [place], [address]  Pager: [phone], If no answer or between  15:00h - 7:00h: call [phone] Telephone: [phone]  7:08 PM 10/15/2021

## 2021-10-21 LAB — PULMONARY FUNCTION TEST
DL/VA % pred: 92 %
DL/VA: 3.94 ml/min/mmHg/L
DLCO cor % pred: 84 %
DLCO cor: 14.53 ml/min/mmHg
DLCO unc % pred: 84 %
DLCO unc: 14.53 ml/min/mmHg
FEF 25-75 Post: 0.78 L/sec
FEF 25-75 Pre: 0.54 L/sec
FEF2575-%Change-Post: 43 %
FEF2575-%Pred-Post: 46 %
FEF2575-%Pred-Pre: 32 %
FEV1-%Change-Post: 10 %
FEV1-%Pred-Post: 71 %
FEV1-%Pred-Pre: 65 %
FEV1-Post: 1.37 L
FEV1-Pre: 1.24 L
FEV1FVC-%Change-Post: 3 %
FEV1FVC-%Pred-Pre: 81 %
FEV6-%Change-Post: 7 %
FEV6-%Pred-Post: 88 %
FEV6-%Pred-Pre: 82 %
FEV6-Post: 2.14 L
FEV6-Pre: 1.99 L
FEV6FVC-%Change-Post: 0 %
FEV6FVC-%Pred-Post: 103 %
FEV6FVC-%Pred-Pre: 103 %
FVC-%Change-Post: 6 %
FVC-%Pred-Post: 85 %
FVC-%Pred-Pre: 79 %
FVC-Post: 2.17 L
Post FEV1/FVC ratio: 63 %
Post FEV6/FVC ratio: 99 %
Pre FEV1/FVC ratio: 61 %
Pre FEV6/FVC Ratio: 98 %
RV % pred: 131 %
RV: 2.68 L
TLC % pred: 105 %
TLC: 4.76 L

## 2021-11-06 ENCOUNTER — Telehealth: Payer: Self-pay | Admitting: Acute Care

## 2021-11-09 NOTE — Telephone Encounter (Signed)
Dr. Chase Caller has ordered/scheduled another  PET for November 2023 so as long as she does the PET, she should not need a LDCT  for now.

## 2022-01-04 ENCOUNTER — Encounter (HOSPITAL_COMMUNITY)
Admission: RE | Admit: 2022-01-04 | Discharge: 2022-01-04 | Disposition: A | Discharge status: Home / Self Care | Payer: Medicare Other | Source: Ambulatory Visit | Attending: Internal Medicine | Admitting: Internal Medicine

## 2022-01-04 DIAGNOSIS — R590 Localized enlarged lymph nodes: Secondary | ICD-10-CM | POA: Diagnosis not present

## 2022-01-04 DIAGNOSIS — R599 Enlarged lymph nodes, unspecified: Secondary | ICD-10-CM | POA: Insufficient documentation

## 2022-01-04 DIAGNOSIS — R948 Abnormal results of function studies of other organs and systems: Secondary | ICD-10-CM | POA: Diagnosis not present

## 2022-01-04 LAB — GLUCOSE, CAPILLARY: Glucose-Capillary: 94 mg/dL (ref 70–99)

## 2022-01-04 MED ORDER — FLUDEOXYGLUCOSE F - 18 (FDG) INJECTION
6.5000 | Freq: Once | INTRAVENOUS | Status: AC
  Administered 2022-01-04: 6 via INTRAVENOUS

## 2022-01-11 ENCOUNTER — Telehealth: Payer: Self-pay | Admitting: Internal Medicine

## 2022-01-11 ENCOUNTER — Other Ambulatory Visit: Payer: Self-pay | Admitting: Family Medicine

## 2022-01-11 DIAGNOSIS — N9489 Other specified conditions associated with female genital organs and menstrual cycle: Secondary | ICD-10-CM

## 2022-01-11 NOTE — Telephone Encounter (Signed)
The PET scan is similar to a year ago but there is concern for ovarian mass  Plan - spiro/dlco in next 2 months and see me - 30 min visit  - she needs Ultrasound Abd before she sees PCP Mosie Lukes, MD 01/28/22. I am letting PCP know  Thanks    SIGNATURE    Dr. Brand Males, M.D., F.C.C.P,  Pulmonary and Critical Care Medicine Staff Physician, [place] Director - Interstitial Lung Disease  Program  Medical Director - [facility] [place] at [place], [place], [address]   Pager: [phone], If no answer  -> Check AMION or Try [phone] Telephone (clinical office): [phone] Telephone (research): [phone]  8:06 AM 01/11/2022       Latest Ref Rng & Units 10/14/2021   12:39 PM  PFT Results  FVC-Predicted Pre % 79   FVC-Post L 2.17   FVC-Predicted Post % 85   Pre FEV1/FVC % % 61   Post FEV1/FCV % % 63   FEV1-Pre L 1.24   FEV1-Predicted Pre % 65   FEV1-Post L 1.37   DLCO uncorrected ml/min/mmHg 14.53   DLCO UNC% % 84   DLCO corrected ml/min/mmHg 14.53   DLCO COR %Predicted % 84   DLVA Predicted % 92   TLC L 4.76   TLC % Predicted % 105   RV % Predicted % 131      Narrative & Impression  CLINICAL DATA:  Subsequent treatment strategy for mediastinal adenopathy.   EXAM: NUCLEAR MEDICINE PET SKULL BASE TO THIGH   TECHNIQUE: 6.0 mCi F-18 FDG was injected intravenously. Full-ring PET imaging was performed from the skull base to thigh after the radiotracer. CT data was obtained and used for attenuation correction and anatomic localization.   Fasting blood glucose: 94 mg/dl   COMPARISON:  CT chest 09/07/2021, 04/20/2021. PET 01/27/2021. CT chest 01/12/2021 and 09/29/2019.   FINDINGS: Mediastinal blood pool activity: SUV max 2.2   Liver activity: SUV max 3.0   NECK:   Focal asymmetric right nasopharyngeal uptake, without CT correlate. Small hypermetabolic  level 2 lymph nodes with index 6 mm lymph node on the right (4/32), SUV max 3.9.   Incidental CT findings:   None.   CHEST:   Similar metabolic thoracic inlet lymph nodes with an index right supraclavicular lymph node measuring 8 mm (4/42), SUV max 4.4. Similar mediastinal and hilar hypermetabolic lymph nodes. Index low right paratracheal lymph node measures 9 mm (4/57), SUV max 7.7. Small left axillary lymph nodes no longer show metabolism above blood pool. No hypermetabolic pulmonary nodules.   Incidental CT findings:   Atherosclerotic calcification of the aorta. Heart size normal. No pericardial or pleural effusion. Mild pulmonary parenchymal coarsening, poorly evaluated due to technique.   ABDOMEN/PELVIS:   No abnormal hypermetabolism in the liver, adrenal glands, spleen or pancreas. Small portacaval lymph node measures 5 mm (4/100), SUV max of 3.3, likely similar. No additional hypermetabolic lymph nodes.   Incidental CT findings:   Liver, gallbladder, adrenal glands, kidneys, spleen, pancreas, stomach and bowel are grossly unremarkable. Atherosclerotic calcification of the aorta. Simple appearing cystic mass in the left adnexa measures 8.4 x 10.1 cm, increased in size from 7.5 x 8.7 cm on 09/30/2019.   SKELETON:   No abnormal hypermetabolism.   Incidental CT findings:   Old posterior left rib fracture.  Degenerative changes in the spine.   IMPRESSION: 1. Persistent  hypermetabolic neck, thoracic inlet, mediastinal, hilar and upper abdominal lymph nodes, possibly due to sarcoid. Mediastinal nodal biopsy 02/27/2021. 2. Interstitial lung disease, better evaluated on 09/07/2021. Sarcoid with suggested at that time. 3. Simple appearing cystic mass in the left adnexa, increased in size from 09/30/2019. Because this lesion is not adequately characterized, prompt [place] is recommended for further evaluation. Note: This recommendation does not apply to premenarchal  patients and to those with increased risk (genetic, family history, elevated tumor markers or other high-risk factors) of ovarian cancer. Reference: JACR 2020 Feb; 17(2):248-254 4.  Aortic atherosclerosis (ICD10-I70.0).     Electronically Signed   By: Lorin Picket M.D.   On: 01/06/2022 11:34

## 2022-01-11 NOTE — Telephone Encounter (Signed)
Called pt was advised of [place] and pt stated she do see OB/GYN was advised to follow up  After her [place] to discuss

## 2022-01-11 NOTE — Telephone Encounter (Signed)
[place] the PET scan is showing enlaging adnexal mass. She is seeing you 01/28/22. Not sure what kind of [place] to order other than Abd [place]. Any vaginal [place]? Will oyu be able to handle it?  Thanks    SIGNATURE    Dr. Brand Males, M.D., F.C.C.P,  Pulmonary and Critical Care Medicine Staff Physician, [place] Director - Interstitial Lung Disease  Program  Medical Director - [facility] [place] at [place], [place], [address]   Pager: [phone], If no answer  -[place] or Try [phone] Telephone (clinical office): [phone] Telephone (research): [phone]  8:10 AM 01/11/2022

## 2022-01-11 NOTE — Telephone Encounter (Signed)
PCP has ordered an ultrasound to be done due to the mass. PCP also has reached out to pt.  Called pt and have scheduled her for f/u appts. Nothing further needed.

## 2022-01-12 ENCOUNTER — Ambulatory Visit (HOSPITAL_BASED_OUTPATIENT_CLINIC_OR_DEPARTMENT_OTHER): Payer: Medicare Other

## 2022-01-18 ENCOUNTER — Ambulatory Visit (HOSPITAL_BASED_OUTPATIENT_CLINIC_OR_DEPARTMENT_OTHER): Admission: RE | Admit: 2022-01-18 | Payer: Medicare Other | Source: Ambulatory Visit

## 2022-01-28 ENCOUNTER — Encounter: Payer: Medicare Other | Admitting: Family Medicine

## 2022-02-02 NOTE — Assessment & Plan Note (Deleted)

## 2022-02-02 NOTE — Assessment & Plan Note (Deleted)
Patient encouraged to maintain heart healthy diet, regular exercise, adequate sleep. Consider daily probiotics. Take medications as prescribed. Labs ordered and reviewed  Colonoscopy MGM Pap Dexa Prevnar 20 Shingrix RSV (respiratory syncitial virus) vaccine at pharmacy, Arexvy Covid booster a pharmacy High dose flu shot

## 2022-02-02 NOTE — Assessment & Plan Note (Deleted)
Encourage heart healthy diet such as MIND or DASH diet, increase exercise, avoid trans fats, simple carbohydrates and processed foods, consider a krill or fish or flaxseed oil cap daily.  °

## 2022-02-03 ENCOUNTER — Encounter: Payer: Medicare Other | Admitting: Family Medicine

## 2022-02-03 DIAGNOSIS — M8580 Other specified disorders of bone density and structure, unspecified site: Secondary | ICD-10-CM

## 2022-02-03 DIAGNOSIS — Z0000 Encounter for general adult medical examination without abnormal findings: Secondary | ICD-10-CM

## 2022-02-03 DIAGNOSIS — E785 Hyperlipidemia, unspecified: Secondary | ICD-10-CM

## 2022-03-09 ENCOUNTER — Ambulatory Visit (INDEPENDENT_AMBULATORY_CARE_PROVIDER_SITE_OTHER): Payer: BLUE CROSS/BLUE SHIELD | Admitting: Family

## 2022-03-09 ENCOUNTER — Encounter: Payer: Self-pay | Admitting: Family

## 2022-03-09 VITALS — BP 114/47 | HR 80 | Temp 97.7°F | Resp 16 | Ht 61.0 in | Wt 123.0 lb

## 2022-03-09 DIAGNOSIS — Z0000 Encounter for general adult medical examination without abnormal findings: Secondary | ICD-10-CM | POA: Diagnosis not present

## 2022-03-09 DIAGNOSIS — E559 Vitamin D deficiency, unspecified: Secondary | ICD-10-CM

## 2022-03-09 DIAGNOSIS — Z1231 Encounter for screening mammogram for malignant neoplasm of breast: Secondary | ICD-10-CM

## 2022-03-09 LAB — VITAMIN D 25 HYDROXY (VIT D DEFICIENCY, FRACTURES): VITD: 24.98 ng/mL — ABNORMAL LOW (ref 30.00–100.00)

## 2022-03-09 LAB — CBC WITH DIFFERENTIAL/PLATELET
Basophils Absolute: 0.2 10*3/uL — ABNORMAL HIGH (ref 0.0–0.1)
Basophils Relative: 1.3 % (ref 0.0–3.0)
Eosinophils Absolute: 3.3 10*3/uL — ABNORMAL HIGH (ref 0.0–0.7)
Eosinophils Relative: 28.3 % — ABNORMAL HIGH (ref 0.0–5.0)
HCT: 43.1 % (ref 36.0–46.0)
Hemoglobin: 14 g/dL (ref 12.0–15.0)
Lymphocytes Relative: 15.9 % (ref 12.0–46.0)
Lymphs Abs: 1.8 10*3/uL (ref 0.7–4.0)
MCHC: 32.4 g/dL (ref 30.0–36.0)
MCV: 88.6 fl (ref 78.0–100.0)
Monocytes Absolute: 1.1 10*3/uL — ABNORMAL HIGH (ref 0.1–1.0)
Monocytes Relative: 9.3 % (ref 3.0–12.0)
Neutro Abs: 5.2 10*3/uL (ref 1.4–7.7)
Neutrophils Relative %: 45.2 % (ref 43.0–77.0)
Platelets: 260 10*3/uL (ref 150.0–400.0)
RBC: 4.87 Mil/uL (ref 3.87–5.11)
RDW: 13.9 % (ref 11.5–15.5)
WBC: 11.5 10*3/uL — ABNORMAL HIGH (ref 4.0–10.5)

## 2022-03-09 LAB — COMPREHENSIVE METABOLIC PANEL
ALT: 15 U/L (ref 0–35)
AST: 17 U/L (ref 0–37)
Albumin: 4.4 g/dL (ref 3.5–5.2)
Alkaline Phosphatase: 141 U/L — ABNORMAL HIGH (ref 39–117)
BUN: 16 mg/dL (ref 6–23)
CO2: 27 mEq/L (ref 19–32)
Calcium: 9.6 mg/dL (ref 8.4–10.5)
Chloride: 104 mEq/L (ref 96–112)
Creatinine, Ser: 1.12 mg/dL (ref 0.40–1.20)
GFR: 49.42 mL/min — ABNORMAL LOW (ref >60.00)
Glucose, Bld: 83 mg/dL (ref 70–99)
Potassium: 4.1 mEq/L (ref 3.5–5.1)
Sodium: 140 mEq/L (ref 135–145)
Total Bilirubin: 0.6 mg/dL (ref 0.2–1.2)
Total Protein: 7.4 g/dL (ref 6.0–8.3)

## 2022-03-09 LAB — LIPID PANEL
Cholesterol: 259 mg/dL — ABNORMAL HIGH (ref 0–200)
HDL: 77.6 mg/dL (ref >39.00)
LDL Cholesterol: 142 mg/dL — ABNORMAL HIGH (ref 0–99)
NonHDL: 181.1
Total CHOL/HDL Ratio: 3
Triglycerides: 197 mg/dL — ABNORMAL HIGH (ref 0.0–149.0)
VLDL: 39.4 mg/dL (ref 0.0–40.0)

## 2022-03-09 LAB — TSH: TSH: 2.27 u[IU]/mL (ref 0.35–5.50)

## 2022-03-09 NOTE — Progress Notes (Signed)
Subjective:     Patient ID: Danielle Fox, female    DOB: 1951-02-23, 72 y.o.   MRN: [id number]  Chief Complaint  Patient presents with   Annual Exam    HPI  Patient presents today for complete physical.  Immunizations: reports that she had flu shot at [facility], declines further covid booster Diet: healthy Exercise: reports that she exercises several time a week and stays busy at home Colonoscopy: 11/22/2018 Dexa: 2022 Mammogram: due  Vision: up to date Dental: up to date     Health Maintenance Due  Topic Date Due   Hepatitis C Screening  Never done   Pneumonia Vaccine 42+ Years old (2 - PPSV23 or PCV20) 06/29/2017   MAMMOGRAM  01/05/2018   Zoster Vaccines- Shingrix (2 of 2) 05/02/2018   COVID-19 Vaccine (4 - 2023-24 season) 10/30/2021    Past Medical History:  Diagnosis Date   Allergy    Clostridioides difficile infection 10/19/2018   tested 10/2018 negative   Colon polyps    Constipation 10/06/2014   COPD (chronic obstructive pulmonary disease) ([place])    Osteopenia    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   Preventative health care 06/29/2016   Pruritus 06/29/2016   Pyloric stenosis in adult 05/2019   Welcome to Medicare preventive visit 06/29/2016    Past Surgical History:  Procedure Laterality Date   ABDOMINAL HYSTERECTOMY     APPENDECTOMY     COLONOSCOPY     01-14-2005   DILATION AND CURETTAGE OF UTERUS     FINE NEEDLE ASPIRATION  02/27/2021   Procedure: FINE NEEDLE ASPIRATION (FNA) LINEAR;  Surgeon: Garner Nash, DO;  Location: [facility];  Service: Pulmonary;;   SINUS IRRIGATION  04/02/2015   TUBAL LIGATION     UPPER GASTROINTESTINAL ENDOSCOPY  11/22/2018   VIDEO BRONCHOSCOPY WITH ENDOBRONCHIAL ULTRASOUND Bilateral 02/27/2021   Procedure: VIDEO BRONCHOSCOPY WITH ENDOBRONCHIAL ULTRASOUND;  Surgeon: Garner Nash, DO;  Location: [facility];  Service: Pulmonary;  Laterality: Bilateral;    Family History  Problem Relation Age of Onset   Heart  disease Mother        cabg, MI   Alcohol abuse Father    Cirrhosis Father    Heart disease Father        MI   Colon cancer Neg Hx    Colon polyps Neg Hx    Esophageal cancer Neg Hx    Stomach cancer Neg Hx    Rectal cancer Neg Hx     Social History   Socioeconomic History   Marital status: Married    Spouse name: Not on file   Number of children: 3   Years of education: Not on file   Highest education level: Not on file  Occupational History   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[employer], retired    Fish farm manager: [employer]  Tobacco Use   Smoking status: Former    Packs/day: 1.00    Years: 48.00    Total pack years: 48.00    Types: Cigarettes    Quit date: 02/19/2014    Years since quitting: 8.0   Smokeless tobacco: Never   Tobacco comments:    Quit and has no desire to smoke again,  Vaping Use   Vaping Use: Never used  Substance and Sexual Activity   Alcohol use: Yes    Alcohol/week: 0.0 standard drinks of alcohol    Comment: socially   Drug use: No   Sexual activity: Yes    Partners: Male  Other Topics Concern   Not on file  Social History Narrative   Exercise-- walks dog on occasion   Right Handed   One Story Home   Does not drink caffeine    Works part time at the [employer] [place] Strain: [place]  (07/16/2021)   Overall Financial Resource Strain (CARDIA)    Difficulty of Paying Living Expenses: Not hard at all  Food Insecurity: No Food Insecurity (07/16/2021)   Hunger Vital Sign    Worried About Running Out of Food in the Last Year: Never true    [place] in the Last Year: Never true  Transportation Needs: No Transportation Needs (07/16/2021)   PRAPARE - Hydrologist (Medical): No    Lack of Transportation (Non-Medical): No  Physical Activity: Sufficiently Active (07/16/2021)   Exercise Vital Sign    Days of Exercise per Week: 7 days    Minutes of Exercise per Session: 60  min  Stress: No Stress Concern Present (07/16/2021)   [place]    Feeling of Stress : Not at all  Social Connections: [place] (07/16/2021)   Social Connection and Isolation Panel [NHANES]    Frequency of Communication with Friends and Family: More than three times a week    Frequency of Social Gatherings with Friends and Family: More than three times a week    Attends Religious Services: More than 4 times per year    Active Member of [employer] or Organizations: Yes    Attends Music therapist: More than 4 times per year    Marital Status: Married  Human resources officer Violence: Not At Risk (07/16/2021)   Humiliation, Afraid, Rape, and Kick questionnaire    Fear of Current or Ex-Partner: No    Emotionally Abused: No    Physically Abused: No    Sexually Abused: No    Outpatient Medications Prior to Visit  Medication Sig Dispense Refill   budesonide-formoterol (SYMBICORT) 160-4.5 MCG/ACT inhaler Inhale 2 puffs into the lungs 2 (two) times daily. in the morning and at bedtime. 10.2 g 5   esomeprazole (NEXIUM) 40 MG capsule Take 1 capsule (40 mg total) by mouth daily before breakfast. 90 capsule 3   Multiple Vitamins-Minerals (MULTIVITAMIN WITH MINERALS) tablet Take 1 tablet by mouth daily.     Tiotropium Bromide Monohydrate (SPIRIVA RESPIMAT) 1.25 MCG/ACT AERS Inhale 2 puffs into the lungs daily. 4 g 0   No facility-administered medications prior to visit.    Allergies  Allergen Reactions   Doxycycline Nausea And Vomiting   Vancomycin     Nausea and abdominal pain   Erythromycin Base Nausea Only    Review of Systems  Constitutional:  Negative for weight loss.  HENT:  Negative for congestion and hearing loss.   Eyes:  Negative for blurred vision.  Respiratory:  Negative for cough.   Cardiovascular:  Negative for leg swelling.  Gastrointestinal:  Negative for constipation and diarrhea.   Genitourinary:  Negative for dysuria, frequency and hematuria.  Musculoskeletal:  Negative for joint pain and myalgias.  Skin:  Negative for rash.       Toenail fungus  Neurological:  Negative for headaches.  Psychiatric/Behavioral:         Denies depression/anxiety       Objective:    Physical Exam  BP (!) 114/47 (BP Location: Right Arm, Patient Position:  Sitting, Cuff Size: Small)   Pulse 80   Temp 97.7 F (36.5 C) (Oral)   Resp 16   Ht '5\' 1"'$  (1.549 m)   Wt 123 lb (55.8 kg)   SpO2 96%   BMI 23.24 kg/m  Wt Readings from Last 3 Encounters:  03/09/22 123 lb (55.8 kg)  10/15/21 121 lb 6.4 oz (55.1 kg)  09/22/21 122 lb 3.2 oz (55.4 kg)   Physical Exam  Constitutional: She is oriented to person, place, and time. She appears well-developed and well-nourished. No distress.  HENT:  Head: Normocephalic and atraumatic.  Right Ear: Tympanic membrane and ear canal normal.  Left Ear: Tympanic membrane and ear canal normal.  Mouth/Throat: Oropharynx is clear and moist.  Eyes: Pupils are equal, round, and reactive to light. No scleral icterus.  Neck: Normal range of motion. No thyromegaly present.  Cardiovascular: Normal rate and regular rhythm.   No murmur heard. Pulmonary/Chest: Effort normal and breath sounds normal. No respiratory distress. He has no wheezes. She has no rales. She exhibits no tenderness.  Abdominal: Soft. Bowel sounds are normal. She exhibits no distension and no mass. There is no tenderness. There is no rebound and no guarding.  Musculoskeletal: She exhibits no edema.  Lymphadenopathy:    She has no cervical adenopathy.  Neurological: She is alert and oriented to person, place, and time. She has normal patellar reflexes. She exhibits normal muscle tone. Coordination normal.  Skin: Skin is warm and dry.  Psychiatric: She has a normal mood and affect. Her behavior is normal. Judgment and thought content normal.  Breast/pelvic: deferred            Assessment & Plan:       Assessment & Plan:   Problem List Items Addressed This Visit       Unprioritized   Vitamin D deficiency   Relevant Orders   VITAMIN D 25 Hydroxy (Vit-D Deficiency, Fractures)   Preventative health care    Continue healthy diet and regular exercise. Refer for mammo.  Recommended that she get the RSV vaccine. Colo up to date. Dexa up to date.      Relevant Orders   CBC with Differential/Platelet   Comprehensive metabolic panel   Lipid panel   TSH   Other Visit Diagnoses     Breast cancer screening by mammogram    -  Primary   Relevant Orders   MM 3D SCREEN BREAST BILATERAL       I have discontinued Sarahi M. Runnels "Mickey"'s Spiriva Respimat. I am also having her maintain her esomeprazole, multivitamin with minerals, and budesonide-formoterol.  No orders of the defined types were placed in this encounter.

## 2022-03-09 NOTE — Assessment & Plan Note (Signed)
Continue healthy diet and regular exercise. Refer for mammo.  Recommended that she get the RSV vaccine. Colo up to date. Dexa up to date.

## 2022-03-10 ENCOUNTER — Telehealth: Payer: Self-pay | Admitting: Family

## 2022-03-10 DIAGNOSIS — N289 Disorder of kidney and ureter, unspecified: Secondary | ICD-10-CM | POA: Insufficient documentation

## 2022-03-10 DIAGNOSIS — B351 Tinea unguium: Secondary | ICD-10-CM

## 2022-03-10 MED ORDER — VITAMIN D (ERGOCALCIFEROL) 1.25 MG (50000 UNIT) PO CAPS: 50000.0000 [IU] | ORAL_CAPSULE | ORAL | 0 refills | Status: DC

## 2022-03-10 MED ORDER — TERBINAFINE HCL 250 MG PO TABS: 250.0000 mg | ORAL_TABLET | Freq: Every day | ORAL | 0 refills | Status: DC

## 2022-03-10 NOTE — Telephone Encounter (Signed)
Left message on pt's voicemail advising her to check her mychart messages and let me know if she has any questions.

## 2022-03-10 NOTE — Telephone Encounter (Signed)
See mychart.  

## 2022-03-16 ENCOUNTER — Encounter: Payer: Self-pay | Admitting: Internal Medicine

## 2022-03-16 ENCOUNTER — Ambulatory Visit: Payer: BLUE CROSS/BLUE SHIELD | Admitting: Internal Medicine

## 2022-03-16 VITALS — BP 100/60 | HR 82 | Temp 98.0°F | Ht 61.0 in | Wt 121.0 lb

## 2022-03-16 DIAGNOSIS — J849 Interstitial pulmonary disease, unspecified: Secondary | ICD-10-CM

## 2022-03-16 DIAGNOSIS — R599 Enlarged lymph nodes, unspecified: Secondary | ICD-10-CM

## 2022-03-16 DIAGNOSIS — J449 Chronic obstructive pulmonary disease, unspecified: Secondary | ICD-10-CM | POA: Diagnosis not present

## 2022-03-16 DIAGNOSIS — R948 Abnormal results of function studies of other organs and systems: Secondary | ICD-10-CM | POA: Diagnosis not present

## 2022-03-16 DIAGNOSIS — R590 Localized enlarged lymph nodes: Secondary | ICD-10-CM

## 2022-03-16 LAB — PULMONARY FUNCTION TEST
DL/VA % pred: 75 %
DL/VA: 3.2 ml/min/mmHg/L
DLCO cor % pred: 68 %
DLCO cor: 11.79 ml/min/mmHg
DLCO unc % pred: 69 %
DLCO unc: 12 ml/min/mmHg
FEF 25-75 Pre: 0.66 L/sec
FEF2575-%Pred-Pre: 39 %
FEV1-%Pred-Pre: 73 %
FEV1-Pre: 1.41 L
FEV1FVC-%Pred-Pre: 85 %
FEV6-%Pred-Pre: 87 %
FEV6-Pre: 2.13 L
FEV6FVC-%Pred-Pre: 102 %
FVC-%Pred-Pre: 85 %
FVC-Pre: 2.17 L
Pre FEV1/FVC ratio: 65 %
Pre FEV6/FVC Ratio: 98 %

## 2022-03-16 NOTE — Patient Instructions (Addendum)
  Hilar adenopathy & Mediastinal adenopathy Abnormal PET scan of mediastinum Nov 2022. Non diagnostic bronchoscopy January 2023 former smoker  -PET scan November 2023 still shows hyperactive lymph nodes but stable in size x 1 year -Stability with nondiagnostic bronchoscopy 1 year ago suggest reactive process versus sarcoid  Plan  - Sharedd ecision making for continued monitoring approach  -Will hold off on referral to thoracic surgery for mediastinal [place]  ILD (interstitial lung disease) ([place]) -absent 2019 and seen first in 2021 and then February 2023 -> stable end of 2023/early 2024  -Shared decision making for continued expectant approach with supportive monitoring; avoid biopsy and antifibrotic's  Pla  - repeat spiro/dlco In 4 months Nov 2023 -No antifibrotic's or biopsy at this visit   Chronic obstructive pulmonary disease, unspecified COPD type ([place]); Moderate  Plan  - continue symbicort scheduled with albuterol as needed   Followup 4 months 30-minute visit but after spirometry and DLCO

## 2022-03-16 NOTE — Progress Notes (Signed)
Spirometry and Dlco done today. 

## 2022-03-16 NOTE — Progress Notes (Signed)
IOV Dr Valeta Harms Dec 2022   This is a 72 year old female, past medical history of COPD, prior positive PPD.Present for evaluation of lung nodule.  Patient was enrolled in lung cancer screening program had an abnormal lung cancer screening CT on 01/12/2021.  Had mild associated mediastinal adenopathy she had multiple small but hypermetabolic mediastinal and hilar nodes.  Additionally a small right supraclavicular cervical and left axillary node.  Other small bilateral multiple pulmonary nodules.  Concerning for possible underlying granulomatous disease.  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 Felecia Shelling a few years prior.  She has subsequently recovered from this.  She also had a recent URI and was treated with antibiotics and steroids.  This is getting better.   OV 03/13/2021: Here today for follow-up after bronchoscopy.  Patient was taken for bronchoscopy following nuclear medicine pet imaging which revealed hypermetabolic adenopathy within the chest.  Tissue sampling of the lymph nodes were all negative.  Flow cytometry also revealed no polyclonal or monoclonal proliferations of lymphocytes.  No evidence of lymphoma.  There was also no granulomatous disease noted.  We reviewed her pathology results today in the office which all suggest a reactive adenopathy.   OV 06/01/2021: Here today for follow-up after recent CT scan of the chest.  Thankfully lymphadenopathy is stable.  She otherwise feels great.  Leaving soon to go spend a couple of weeks and [place] with family.  From respiratory standpoint she is able to get around do everything that she wants to do.  She was happy to receive the news.  Also discussed importance of repeat follow-up imaging with prominent adenopathy.  I do suspect she is probably dealing with sarcoidosis and we just did not make a diagnosis on last bronchoscopy.  As long as her lymph nodes are stable I think we can avoid repeat procedure versus even  consideration for mediastinoscopy.  We talked about this today in the office.   OV 09/22/2021: Here today for follow-up after repeat CT chest to follow lymphadenopathy and progressive fibrosis. Lymphadenopathy is unchanged from prior however overall shows progression of fibrosis from initial scans in 2022. She does report increased exertional dyspnea at this time and uses albuterol inhaler twice daily now, was previously using this just once. She notes that between the heat which exacerbates her dyspnea and the recent increase in environmental trigger ([place] smoke) it has seemed worse. Overall clinical suspicion still remains high for sarcoidosis though we have not been able to confirm this at this point. We discussed a referral to the ILD clinic for further recommendations re: treating prophylactically with immune modulators, anti-fibrotic medications versus further work-up which she is in agreement with.       OV 10/15/2021 - transfe of care from Dr Valeta Harms to DR Chase Caller  Subjective:  Patient ID: Danielle Fox, female , DOB: 07-Sep-1950 , age 73 y.o. , MRN: [id number] , ADDRESS: [place] [id number] PCP Mosie Lukes, MD Patient Care Team: Mosie Lukes, MD as PCP - General (Family Medicine)  This Provider for this visit: Treatment Team:  Attending Provider: Brand Males, MD    10/15/2021 -   Chief Complaint  Patient presents with   Consult    Pt had recent CT performed and is here today to have the results discussed. Pt denies any current complaints of cough, SOB, or chest discomfort.     HPI Danely M Buenger 72 y.o. -reports that 2 years ago had  encephalitis (reivew of chart suggest stroke). Prior smoker. [place] Parents smoked heavily.  Says in after mtath of neuro issue CT was abnormal. Diffuse adenopathy esp mediastinal Nov 2022 - now non diagnostic early 2023. Consideration of sarcoid  On followup. Dr Valeta Harms has  reocmmended followup PET. Had CT - shows ILD - in my view very mild and c/w indeterminate for UIP pattern. SErioogy negative. Has only mild DOE and somecough. Uses cymbicort. PFT shows obsstruction interestingly with BD response and normal DLCO . Cough is mild only. She has upcoming travel with family to [place] Sept 2023 and wants to hold off majore workup     CT chest 09/07/21  Narrative & Impression  CLINICAL DATA:  Abnormal CT chest. Evaluate for interstitial lung disease.   EXAM: CT CHEST WITHOUT CONTRAST   TECHNIQUE: Multidetector CT imaging of the chest was performed following the standard protocol without intravenous contrast. High resolution imaging of the lungs, as well as inspiratory and expiratory imaging, was performed.   RADIATION DOSE REDUCTION: This exam was performed according to the departmental dose-optimization program which includes automated exposure control, adjustment of the mA and/or kV according to patient size and/or use of iterative reconstruction technique.   COMPARISON:  04/20/2021, PET 01/27/2021, CT chest 01/12/2021 and 09/29/2019.   FINDINGS: Cardiovascular: Atherosclerotic calcification of the aorta. Heart size normal. No pericardial effusion.   Mediastinum/Nodes: Mediastinal adenopathy is again seen with an index low right paratracheal lymph node measuring 11 mm, as before. Hilar regions are difficult to evaluate without IV contrast. No axillary adenopathy. Esophagus is grossly unremarkable.   Lungs/Pleura: Biapical pleuroparenchymal scarring. Centrilobular and paraseptal emphysema. Patchy pulmonary parenchymal ground-glass is predominantly peripheral in location and without a definite craniocaudal gradient. A few scattered subpleural reticular densities are seen. Mild traction bronchiolectasis. Possible early honeycombing in the posterior right lower lobe (7/77), versus paraseptal emphysema. Findings are similar to 04/20/2021  but progressive from 01/12/2021. Millimetric pulmonary nodules, as before. No suspicious pulmonary nodules. Scattered mucoid impaction. No pleural fluid. Airway is unremarkable. No air trapping.   Upper Abdomen: Visualized portions of the liver, gallbladder, adrenal glands, kidneys, spleen, pancreas, stomach and bowel are grossly unremarkable. Upper abdominal lymph nodes are not enlarged by CT size criteria.   Musculoskeletal: Mild degenerative changes in the spine. Old left rib fracture. No worrisome lytic or sclerotic lesions.   IMPRESSION: 1. Pulmonary parenchymal pattern of fibrosis appears similar to 04/20/2021 but is progressive from 01/12/2021. Findings may be due to sarcoid. Nonspecific interstitial pneumonitis is considered less likely given progression. Findings are indeterminate for UIP per consensus guidelines: Diagnosis of Idiopathic Pulmonary Fibrosis: An Official ATS/ERS/JRS/ALAT Clinical Practice Guideline. [place], Iss 5, [id number], Oct 30 2016. 2. Mediastinal adenopathy, possibly due to sarcoid and previously evaluated on PET 01/27/2021. 3.  Aortic atherosclerosis (ICD10-I70.0). 4.  Emphysema (ICD10-J43.9).     Electronically Signed   By: Lorin Picket M.D.   On: 09/07/2021 16:38    PFT  OV 03/16/2022  Subjective:  Patient ID: Danielle Fox, female , DOB: 09/29/1950 , age 16 y.o. , MRN: [id number] , ADDRESS: [place] [id number] PCP Mosie Lukes, MD Patient Care Team: Mosie Lukes, MD as PCP - General (Family Medicine)  This Provider for this visit: Treatment Team:  Attending Provider: Brand Males, MD    03/16/2022 -   Chief Complaint  Patient presents with   Follow-up  PFT     HPI Yennifer M Vanderheyden 71 y.o. -  #Follow-up mediastinal adenopathy [PET active November 2022 nondiagnostic bronchoscopy January 2023 and considered reactive by Dr. June Leap early 2023.  Normal ACE  level]   -She had an updated PET scan in November 2023.  Meters adenopathy is still hyperactive but there is no change in size.  #ILD:  -She went to [place] in September 2023.  She walked a lot.  She says she was not any more dyspneic than her sisters.  She barely has any dyspnea.  She feels stable.  We reviewed her latest pulmonary function test that shows stability.  Late last year she had CT scan of the chest that I personally visualized with her and agree with interpretation.  She has early/mild ILD.  She does not want to do any biopsy.  She does not want to start antifibrotic's.  Her current symptom scores are reflected below.  #Other abnormal findings on the PET scan  -Adnexal mass present on the PET scan.  At the time I shared these results with the primary care office.  I reviewed the external records.  She is on Renaldo Harrison primary care nurse practitioner recently.  She says she does NOT want to address adnexal mass with an ultrasound.    SYMPTOM SCALE - ILD 03/16/2022  Current weight   O2 use ra  Shortness of Breath 0 -> 5 scale with 5 being worst (score 6 If unable to do)  At rest 0  Simple tasks - showers, clothes change, eating, shaving 00  Household (dishes, doing bed, laundry) 0  Shopping 0  Walking level at own pace 0  Walking up Stairs 02  Total (30-36) Dyspnea Score 2  How bad is your cough? 2  How bad is your fatigue 0  How bad is nausea 0  How bad is vomiting?  0  How bad is diarrhea? 00  How bad is anxiety? 0  How bad is depression 0  Any chronic pain - if so where and how bad 0       Simple office walk 185 feet x  3 laps goal with forehead probe 10/15/2021    O2 used ra   Number laps completed 3   Comments about pace avg   Resting Pulse Ox/HR 100% and 77/min   Final Pulse Ox/HR 97% and 98/min   Desaturated </= 88% no   Desaturated <= 3% points yes   Got Tachycardic >/= 90/min yes   Symptoms at end of test No complaints   Miscellaneous comments x       PFT     Latest Ref Rng & Units 03/16/2022   11:45 AM 10/14/2021   12:39 PM  PFT Results  FVC-Pre L 2.17  P   FVC-Predicted Pre % 85  P 79   FVC-Post L  2.17   FVC-Predicted Post %  85   Pre FEV1/FVC % % 65  P 61   Post FEV1/FCV % %  63   FEV1-Pre L 1.41  P 1.24   FEV1-Predicted Pre % 73  P 65   FEV1-Post L  1.37   DLCO uncorrected ml/min/mmHg 12.00  P 14.53   DLCO UNC% % 69  P 84   DLCO corrected ml/min/mmHg 11.79  P 14.53   DLCO COR %Predicted % 68  P 84   DLVA Predicted % 75  P 92   TLC L  4.76   TLC % Predicted %  105  RV % Predicted %  131     P Preliminary result   PET SCAN Nov 2023   IMPRESSION: 1. Persistent hypermetabolic neck, thoracic inlet, mediastinal, hilar and upper abdominal lymph nodes, possibly due to sarcoid. Mediastinal nodal biopsy 02/27/2021. 2. Interstitial lung disease, better evaluated on 09/07/2021. Sarcoid with suggested at that time. 3. Simple appearing cystic mass in the left adnexa, increased in size from 09/30/2019. Because this lesion is not adequately characterized, prompt [place] is recommended for further evaluation. Note: This recommendation does not apply to premenarchal patients and to those with increased risk (genetic, family history, elevated tumor markers or other high-risk factors) of ovarian cancer. Reference: JACR 2020 Feb; 17(2):248-254 4.  Aortic atherosclerosis (ICD10-I70.0).     Electronically Signed   By: Lorin Picket M.D.   On: 01/06/2022 11:34      has a past medical history of Allergy, Clostridioides difficile infection (10/19/2018), Colon polyps, Constipation (10/06/2014), COPD (chronic obstructive pulmonary disease) ([place]), Osteopenia, Positive TB test, Preventative health care (06/29/2016), Pruritus (06/29/2016), Pyloric stenosis in adult (05/2019), and Welcome to Medicare preventive visit (06/29/2016).   reports that she quit smoking about 8 years ago. Her smoking use included cigarettes. She has a 48.00  pack-year smoking history. She has never used smokeless tobacco.  Past Surgical History:  Procedure Laterality Date   ABDOMINAL HYSTERECTOMY     APPENDECTOMY     COLONOSCOPY     01-14-2005   DILATION AND CURETTAGE OF UTERUS     FINE NEEDLE ASPIRATION  02/27/2021   Procedure: FINE NEEDLE ASPIRATION (FNA) LINEAR;  Surgeon: Garner Nash, DO;  Location: [facility];  Service: Pulmonary;;   SINUS IRRIGATION  04/02/2015   TUBAL LIGATION     UPPER GASTROINTESTINAL ENDOSCOPY  11/22/2018   VIDEO BRONCHOSCOPY WITH ENDOBRONCHIAL ULTRASOUND Bilateral 02/27/2021   Procedure: VIDEO BRONCHOSCOPY WITH ENDOBRONCHIAL ULTRASOUND;  Surgeon: Garner Nash, DO;  Location: [facility];  Service: Pulmonary;  Laterality: Bilateral;    Allergies  Allergen Reactions   Doxycycline Nausea And Vomiting   Vancomycin     Nausea and abdominal pain   Erythromycin Base Nausea Only    Immunization History  Administered Date(s) Administered   Fluad Quad(high Dose 65+) 01/29/2019, 12/28/2019, 12/02/2020, 01/13/2022   Influenza Split 01/07/2011   Influenza Whole 11/29/2009   Influenza, High Dose Seasonal PF 11/07/2017   Influenza,inj,Quad PF,6+ Mos 12/14/2012   Influenza-Unspecified 12/28/2013, 12/01/2015, 12/25/2016, 12/30/2021   PFIZER(Purple Top)SARS-COV-2 Vaccination 05/06/2019, 06/06/2019, 12/15/2019   Pneumococcal Conjugate-13 06/29/2016   Respiratory Syncytial Virus Vaccine,Recomb Aduvanted(Arexvy) 03/10/2022   Tdap 03/01/2001, 06/29/2016   Zoster Recombinat (Shingrix) 03/07/2018    Family History  Problem Relation Age of Onset   Heart disease Mother        cabg, MI   Alcohol abuse Father    Cirrhosis Father    Heart disease Father        MI   Colon cancer Neg Hx    Colon polyps Neg Hx    Esophageal cancer Neg Hx    Stomach cancer Neg Hx    Rectal cancer Neg Hx      Current Outpatient Medications:    budesonide-formoterol (SYMBICORT) 160-4.5 MCG/ACT inhaler, Inhale 2 puffs into the  lungs 2 (two) times daily. in the morning and at bedtime., Disp: 10.2 g, Rfl: 5   esomeprazole (NEXIUM) 40 MG capsule, Take 1 capsule (40 mg total) by mouth daily before breakfast., Disp: 90 capsule, Rfl: 3   Multiple Vitamins-Minerals (MULTIVITAMIN WITH MINERALS) tablet, Take  1 tablet by mouth daily., Disp: , Rfl:    terbinafine (LAMISIL) 250 MG tablet, Take 1 tablet (250 mg total) by mouth daily., Disp: 30 tablet, Rfl: 0   Vitamin D, Ergocalciferol, (DRISDOL) 1.25 MG (50000 UNIT) CAPS capsule, Take 1 capsule (50,000 Units total) by mouth every 7 (seven) days., Disp: 12 capsule, Rfl: 0      Objective:   Vitals:   03/16/22 1358  BP: 100/60  Pulse: 82  Temp: 98 F (36.7 C)  SpO2: 96%  Weight: 121 lb (54.9 kg)  Height: '5\' 1"'$  (1.549 m)    Estimated body mass index is 22.86 kg/m as calculated from the following:   Height as of this encounter: '5\' 1"'$  (1.549 m).   Weight as of this encounter: 121 lb (54.9 kg).  '@WEIGHTCHANGE'$ @  [employer]   03/16/22 1358  Weight: 121 lb (54.9 kg)     Physical Exam  General: No distress. Looks well Neuro: Alert and Oriented x 3. GCS 15. Speech normal Psych: Pleasant Resp:  Barrel Chest - no.  Wheeze - no, Crackles - no, No overt respiratory distress CVS: Normal heart sounds. Murmurs - no Ext: Stigmata of Connective Tissue Disease - no HEENT: Normal upper airway. PEERL +. No post nasal drip        Assessment:       ICD-10-CM   1. Mediastinal adenopathy  R59.0     2. Abnormal PET scan of mediastinum  R94.8     3. ILD (interstitial lung disease) ([place])  J84.9     4. Chronic obstructive pulmonary disease, unspecified COPD type ([place])  J44.9          Plan:     Patient Instructions   Hilar adenopathy & Mediastinal adenopathy Abnormal PET scan of mediastinum Nov 2022. Non diagnostic bronchoscopy January 2023 former smoker  -PET scan November 2023 still shows hyperactive lymph nodes but stable in size x 1 year -Stability with  nondiagnostic bronchoscopy 1 year ago suggest reactive process versus sarcoid  Plan  - HShareddecision making for continued monitoring approach  -Will hold off on referral to thoracic surgery for mediastinal [place]  ILD (interstitial lung disease) ([place]) -absent 2019 and seen first in 2021 and then February 2023 -> stable end of 2023/early 2024  -Shared decision making for continued expectant approach with supportive monitoring; avoid biopsy and antifibrotic's  Pla  - repeat spiro/dlco In 4 months Nov 2023 -No antifibrotic's or biopsy at this visit   Chronic obstructive pulmonary disease, unspecified COPD type ([place]); Moderate  Plan  - continue symbicort scheduled with albuterol as needed   Followup 4 months 30-minute visit but after spirometry and DLCO  Moderate Complexity MDM NEW OFFICE  The table below is from the 2021 E/M guidelines, first released in 2021, with minor revisions added in 2023. Must meet the requirements for 2 out of 3 dimensions to qualify.    Number and complexity of problems addressed Amount and/or complexity of data reviewed Risk of complications and/or morbidity  One or more chronic illness with mild exacerbation, progression, or side effects of treatment  Two or more stable chronic illnesses  One undiagnosed new problem with uncertain prognosis  One acute illness with systemic symptoms   Acute complicated injury Must meet the requirements for 1 of 3 of the categories)  Category 1: Tests and documents, historian  Any combination of 3 of the following:  Assessment requiring an independent historian  Review of prior external records  Review of results of  each unique test  Ordering of each unique test    Category 2: Interpretation of tests  Independent interpretation of a test perfromed by another physician/NPP  Category 3: Discuss management/tests  Discussion of magagement or tests with an external physician/NPP Prescription drug  management  Decision regarding minor surgery with identfied patient or procedure risk factors  Decision regarding elective major surgery without identified patient or procedure risk factors  Diagnosis or treatment significantly limited by social determinants of health            SIGNATURE    Dr. Brand Males, M.D., F.C.C.P,  Pulmonary and Critical Care Medicine Staff Physician, [place] Director - Interstitial Lung Disease  Program  Pulmonary [place] [facility] [place], [place], [address]  Pager: [phone], If no answer or between  15:00h - 7:00h: call [phone] Telephone: [phone]  2:24 PM 03/16/2022

## 2022-03-22 ENCOUNTER — Ambulatory Visit (HOSPITAL_BASED_OUTPATIENT_CLINIC_OR_DEPARTMENT_OTHER)
Admission: RE | Admit: 2022-03-22 | Discharge: 2022-03-22 | Disposition: A | Discharge status: Home / Self Care | Payer: Medicare Other | Source: Ambulatory Visit | Attending: Family | Admitting: Family

## 2022-03-22 ENCOUNTER — Ambulatory Visit (INDEPENDENT_AMBULATORY_CARE_PROVIDER_SITE_OTHER): Payer: Medicare Other | Admitting: Family

## 2022-03-22 VITALS — BP 106/48 | HR 85 | Temp 97.0°F | Resp 16 | Wt 121.0 lb

## 2022-03-22 DIAGNOSIS — J069 Acute upper respiratory infection, unspecified: Secondary | ICD-10-CM | POA: Insufficient documentation

## 2022-03-22 DIAGNOSIS — G8929 Other chronic pain: Secondary | ICD-10-CM | POA: Diagnosis not present

## 2022-03-22 DIAGNOSIS — M79672 Pain in left foot: Secondary | ICD-10-CM | POA: Insufficient documentation

## 2022-03-22 NOTE — Assessment & Plan Note (Signed)
New. Declines covid testing. Decline tessalon rx.  Recommended nasal saline rinses, fluids, rest, delsym prn. Follow up if symptoms worsen or if symptoms fail to improve.

## 2022-03-22 NOTE — Assessment & Plan Note (Signed)
New.  Will obtain x-ray to rule out fracture.  Suspect plantar fasciitis.  Pt given handout on exercises for Plantar fasciitis. Recommended that she d/c naproxen due to renal insufficiency.  Instead take tylenol and may use topical voltaren gel.

## 2022-03-22 NOTE — Progress Notes (Signed)
Subjective:   By signing my name below, I, Shehryar Baig, attest that this documentation has been prepared under the direction and in the presence of Debbrah Alar, NP. 03/22/2022   Patient ID: Danielle Fox, female    DOB: [birth year], 72 y.o.   MRN: [id number]  Chief Complaint  Patient presents with   Foot Pain    Complains of left foot heel pain   Cough    Complains of productive cough for 5 days   Nasal Congestion    Complains of nasal congestion     Foot Pain Associated symptoms include congestion (nasal congestion), coughing and myalgias (mild general body aches).  Cough Associated symptoms include myalgias (mild general body aches).   Patient is in today for a office visit.  Left heel pain: She complains of left health pain pain that comes and goes. She cannot walk without limping and has sharp pain during her episodes of pain. She has applied Voltaren gel to her heel. She was also taking naproxen but was instructed to stop due to her kidneys.   Congestion: She complains of nasal congestion and cough for the past 5 days. She as not taken any medication for congestion due to them drying her out. She has not taken a Covid-19 test. She is UTD on RSV vaccine. She does not have the latest Covid-19 booster vaccine. She has mild body aches. She has tried [employer] [facility] in the past and found no change in her symptoms. She is interested in taking another cough medication to manage her symptoms.    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   Preventative health care 06/29/2016   Pruritus 06/29/2016   Pyloric stenosis in adult 05/2019   Welcome to Medicare preventive visit 06/29/2016    Past Surgical History:  Procedure Laterality Date   ABDOMINAL  HYSTERECTOMY     APPENDECTOMY     COLONOSCOPY     01-14-2005   DILATION AND CURETTAGE OF UTERUS     FINE NEEDLE ASPIRATION  02/27/2021   Procedure: FINE NEEDLE ASPIRATION (FNA) LINEAR;  Surgeon: Garner Nash, DO;  Location: [facility];  Service: Pulmonary;;   SINUS IRRIGATION  04/02/2015   TUBAL LIGATION     UPPER GASTROINTESTINAL ENDOSCOPY  11/22/2018   VIDEO BRONCHOSCOPY WITH ENDOBRONCHIAL ULTRASOUND Bilateral 02/27/2021   Procedure: VIDEO BRONCHOSCOPY WITH ENDOBRONCHIAL ULTRASOUND;  Surgeon: Garner Nash, DO;  Location: [facility];  Service: Pulmonary;  Laterality: Bilateral;    Family History  Problem Relation Age of Onset   Heart disease Mother        cabg, MI   Alcohol abuse Father    Cirrhosis Father    Heart disease Father        MI   Colon cancer Neg Hx    Colon polyps Neg Hx    Esophageal cancer Neg Hx    Stomach cancer Neg Hx    Rectal cancer Neg Hx     Social History   Socioeconomic History   Marital status: Married    Spouse name: Not on file   Number of children: 3   Years of education: Not on file   Highest education level: Not on file  Occupational History  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[employer], retired    Fish farm manager: [employer]  Tobacco Use   Smoking status: Former    Packs/day: 1.00    Years: 48.00    Total pack years: 48.00    Types: Cigarettes    Quit date: 02/19/2014    Years since quitting: 8.0   Smokeless tobacco: Never   Tobacco comments:    Quit and has no desire to smoke again,  Vaping Use   Vaping Use: Never used  Substance and Sexual Activity   Alcohol use: Yes    Alcohol/week: 0.0 standard drinks of alcohol    Comment: socially   Drug use: No   Sexual activity: Yes    Partners: Male  Other Topics Concern   Not on file  Social History Narrative   Exercise-- walks dog on occasion   Right Handed   One Story Home   Does not drink caffeine    Works part time at the [employer] [place] Strain: [place]  (07/16/2021)   Overall Financial Resource Strain (CARDIA)    Difficulty of Paying Living Expenses: Not hard at all  Food Insecurity: No Food Insecurity (07/16/2021)   Hunger Vital Sign    Worried About Running Out of Food in the Last Year: Never true    [place] in the Last Year: Never true  Transportation Needs: No Transportation Needs (07/16/2021)   PRAPARE - Hydrologist (Medical): No    Lack of Transportation (Non-Medical): No  Physical Activity: Sufficiently Active (07/16/2021)   Exercise Vital Sign    Days of Exercise per Week: 7 days    Minutes of Exercise per Session: 60 min  Stress: No Stress Concern Present (07/16/2021)   [place]    Feeling of Stress : Not at all  Social Connections: [place] (07/16/2021)   Social Connection and Isolation Panel [NHANES]    Frequency of Communication with Friends and Family: More than three times a week    Frequency of Social Gatherings with Friends and Family: More than three times a week    Attends Religious Services: More than 4 times per year    Active Member of [employer] or Organizations: Yes    Attends Music therapist: More than 4 times per year    Marital Status: Married  Human resources officer Violence: Not At Risk (07/16/2021)   Humiliation, Afraid, Rape, and Kick questionnaire    Fear of Current or Ex-Partner: No    Emotionally Abused: No    Physically Abused: No    Sexually Abused: No    Outpatient Medications Prior to Visit  Medication Sig Dispense Refill   budesonide-formoterol (SYMBICORT) 160-4.5 MCG/ACT inhaler Inhale 2 puffs into the lungs 2 (two) times daily. in the morning and at bedtime. 10.2 g 5   esomeprazole (NEXIUM) 40 MG capsule Take 1 capsule (40 mg total) by mouth daily before breakfast. 90 capsule 3   Multiple Vitamins-Minerals (MULTIVITAMIN WITH MINERALS)  tablet Take 1 tablet by mouth daily.     terbinafine (LAMISIL) 250 MG tablet Take 1 tablet (250 mg total) by mouth daily. 30 tablet 0   Vitamin D, Ergocalciferol, (DRISDOL) 1.25 MG (50000 UNIT) CAPS capsule Take 1 capsule (50,000 Units total) by mouth every 7 (seven) days. 12 capsule 0   No facility-administered medications prior to visit.    Allergies  Allergen Reactions  Doxycycline Nausea And Vomiting   Vancomycin     Nausea and abdominal pain   Erythromycin Base Nausea Only    Review of Systems  HENT:  Positive for congestion (nasal congestion).   Respiratory:  Positive for cough.   Musculoskeletal:  Positive for myalgias (mild general body aches).       (+)left heel pain       Objective:    Physical Exam Constitutional:      General: She is not in acute distress.    Appearance: Normal appearance. She is not ill-appearing.  HENT:     Head: Normocephalic and atraumatic.     Right Ear: External ear normal.     Left Ear: External ear normal.  Eyes:     Extraocular Movements: Extraocular movements intact.     Pupils: Pupils are equal, round, and reactive to light.  Cardiovascular:     Rate and Rhythm: Normal rate and regular rhythm.     Heart sounds: Normal heart sounds. No murmur heard.    No gallop.  Pulmonary:     Effort: Pulmonary effort is normal. No respiratory distress.     Breath sounds: Normal breath sounds. No wheezing or rales.  Musculoskeletal:     Comments: No left heel swelling noted  Skin:    General: Skin is warm and dry.  Neurological:     Mental Status: She is alert and oriented to person, place, and time.  Psychiatric:        Judgment: Judgment normal.     BP (!) 106/48 (BP Location: Right Arm, Patient Position: Sitting, Cuff Size: Small)   Pulse 85   Temp (!) 97 F (36.1 C) (Oral)   Resp 16   Wt 121 lb (54.9 kg)   SpO2 98%   BMI 22.86 kg/m  Wt Readings from Last 3 Encounters:  03/22/22 121 lb (54.9 kg)  03/16/22 121 lb (54.9 kg)   03/09/22 123 lb (55.8 kg)       Assessment & Plan:  Heel pain, chronic, left Assessment & Plan: New.  Will obtain x-ray to rule out fracture.  Suspect plantar fasciitis.  Pt given handout on exercises for Plantar fasciitis. Recommended that she d/c naproxen due to renal insufficiency.  Instead take tylenol and may use topical voltaren gel.   Orders: -     DG Foot Complete Left; Future  Viral URI with cough Assessment & Plan: New. Declines covid testing. Decline tessalon rx.  Recommended nasal saline rinses, fluids, rest, delsym prn. Follow up if symptoms worsen or if symptoms fail to improve.    Inflammatory heel pain, left Assessment & Plan: New.  Will obtain x-ray to rule out fracture.  Suspect plantar fasciitis.  Pt given handout on exercises for Plantar fasciitis. Recommended that she d/c naproxen due to renal insufficiency.  Instead take tylenol and may use topical voltaren gel.      I, Nance Pear, NP, personally preformed the services described in this documentation.  All medical record entries made by the scribe were at my direction and in my presence.  I have reviewed the chart and discharge instructions (if applicable) and agree that the record reflects my personal performance and is accurate and complete. 03/22/2022   I,Shehryar Baig,acting as a Education administrator for Nance Pear, NP.,have documented all relevant documentation on the behalf of Nance Pear, NP,as directed by  Nance Pear, NP while in the presence of Nance Pear, NP.   Nance Pear, NP

## 2022-03-24 ENCOUNTER — Telehealth: Payer: Self-pay | Admitting: Family

## 2022-03-24 DIAGNOSIS — M7732 Calcaneal spur, left foot: Secondary | ICD-10-CM

## 2022-03-24 NOTE — Telephone Encounter (Signed)
Patient advised of results and referral ?

## 2022-03-24 NOTE — Telephone Encounter (Signed)
Reviewed foot x-ray and she has a heel spur which is likely contributing to her pain.  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 I am going to go ahead and refer her to podiatry.

## 2022-03-30 ENCOUNTER — Ambulatory Visit (INDEPENDENT_AMBULATORY_CARE_PROVIDER_SITE_OTHER): Payer: Medicare Other

## 2022-03-30 ENCOUNTER — Ambulatory Visit: Payer: Medicare Other | Admitting: Podiatry

## 2022-03-30 VITALS — BP 113/50 | HR 75 | Temp 97.5°F | Resp 14

## 2022-03-30 DIAGNOSIS — M7732 Calcaneal spur, left foot: Secondary | ICD-10-CM

## 2022-03-30 NOTE — Patient Instructions (Addendum)
For instructions on how to put on your Plantar Fascial Brace, please visit [email or web address]   Plantar Fasciitis (Heel Spur Syndrome) with Rehab The plantar fascia is a fibrous, ligament-like, soft-tissue structure that spans the bottom of the foot. Plantar fasciitis is a condition that causes pain in the foot due to inflammation of the tissue. SYMPTOMS   Pain and tenderness on the underneath side of the foot.  Pain that worsens with standing or walking. CAUSES  Plantar fasciitis is caused by irritation and injury to the plantar fascia on the underneath side of the foot. Common mechanisms of injury include:  Direct trauma to bottom of the foot.  Damage to a small nerve that runs under the foot where the main fascia attaches to the heel bone.  Stress placed on the plantar fascia due to bone spurs. RISK INCREASES WITH:   Activities that place stress on the plantar fascia (running, jumping, pivoting, or cutting).  Poor strength and flexibility.  Improperly fitted shoes.  Tight calf muscles.  Flat feet.  Failure to warm-up properly before activity.  Obesity. PREVENTION  Warm up and stretch properly before activity.  Allow for adequate recovery between workouts.  Maintain physical fitness:  Strength, flexibility, and endurance.  Cardiovascular fitness.  Maintain a health body weight.  Avoid stress on the plantar fascia.  Wear properly fitted shoes, including arch supports for individuals who have flat feet.  PROGNOSIS  If treated properly, then the symptoms of plantar fasciitis usually resolve without surgery. However, occasionally surgery is necessary.  RELATED COMPLICATIONS   Recurrent symptoms that may result in a chronic condition.  Problems of the lower back that are caused by compensating for the injury, such as limping.  Pain or weakness of the foot during push-off following surgery.  Chronic inflammation, scarring, and partial or complete  fascia tear, occurring more often from repeated injections.  TREATMENT  Treatment initially involves the use of ice and medication to help reduce pain and inflammation. The use of strengthening and stretching exercises may help reduce pain with activity, especially stretches of the Achilles tendon. These exercises may be performed at home or with a therapist. Your caregiver may recommend that you use heel cups of arch supports to help reduce stress on the plantar fascia. Occasionally, corticosteroid injections are given to reduce inflammation. If symptoms persist for greater than 6 months despite non-surgical (conservative), then surgery may be recommended.   MEDICATION   If pain medication is necessary, then nonsteroidal anti-inflammatory medications, such as aspirin and ibuprofen, or other minor pain relievers, such as acetaminophen, are often recommended.  Do not take pain medication within 7 days before surgery.  Prescription pain relievers may be given if deemed necessary by your caregiver. Use only as directed and only as much as you need.  Corticosteroid injections may be given by your caregiver. These injections should be reserved for the most serious cases, because they may only be given a certain number of times.  HEAT AND COLD  Cold treatment (icing) relieves pain and reduces inflammation. Cold treatment should be applied for 10 to 15 minutes every 2 to 3 hours for inflammation and pain and immediately after any activity that aggravates your symptoms. Use ice packs or massage the area with a piece of ice (ice massage).  Heat treatment may be used prior to performing the stretching and strengthening activities prescribed by your caregiver, physical therapist, or athletic trainer. Use a heat pack or soak the injury in warm water.  SEEK IMMEDIATE MEDICAL   CARE IF:  Treatment seems to offer no benefit, or the condition worsens.  Any medications produce adverse side effects.   EXERCISES- RANGE OF MOTION (ROM) AND STRETCHING EXERCISES - Plantar Fasciitis (Heel Spur Syndrome) These exercises may help you when beginning to rehabilitate your injury. Your symptoms may resolve with or without further involvement from your physician, physical therapist or athletic trainer. While completing these exercises, remember:   Restoring tissue flexibility helps normal motion to return to the joints. This allows healthier, less painful movement and activity.  An effective stretch should be held for at least 30 seconds.  A stretch should never be painful. You should only feel a gentle lengthening or release in the stretched tissue.  RANGE OF MOTION - Toe Extension, Flexion  Sit with your right / left leg crossed over your opposite knee.  Grasp your toes and gently pull them back toward the top of your foot. You should feel a stretch on the bottom of your toes and/or foot.  Hold this stretch for 10 seconds.  Now, gently pull your toes toward the bottom of your foot. You should feel a stretch on the top of your toes and or foot.  Hold this stretch for 10 seconds. Repeat  times. Complete this stretch 3 times per day.   RANGE OF MOTION - Ankle Dorsiflexion, Active Assisted  Remove shoes and sit on a chair that is preferably not on a carpeted surface.  Place right / left foot under knee. Extend your opposite leg for support.  Keeping your heel down, slide your right / left foot back toward the chair until you feel a stretch at your ankle or calf. If you do not feel a stretch, slide your bottom forward to the edge of the chair, while still keeping your heel down.  Hold this stretch for 10 seconds. Repeat 3 times. Complete this stretch 2 times per day.   STRETCH  Gastroc, Standing  Place hands on wall.  Extend right / left leg, keeping the front knee somewhat bent.  Slightly point your toes inward on your back foot.  Keeping your right / left heel on the floor and your  knee straight, shift your weight toward the wall, not allowing your back to arch.  You should feel a gentle stretch in the right / left calf. Hold this position for 10 seconds. Repeat 3 times. Complete this stretch 2 times per day.  STRETCH  Soleus, Standing  Place hands on wall.  Extend right / left leg, keeping the other knee somewhat bent.  Slightly point your toes inward on your back foot.  Keep your right / left heel on the floor, bend your back knee, and slightly shift your weight over the back leg so that you feel a gentle stretch deep in your back calf.  Hold this position for 10 seconds. Repeat 3 times. Complete this stretch 2 times per day.  STRETCH  Gastrocsoleus, Standing  Note: This exercise can place a lot of stress on your foot and ankle. Please complete this exercise only if specifically instructed by your caregiver.   Place the ball of your right / left foot on a step, keeping your other foot firmly on the same step.  Hold on to the wall or a rail for balance.  Slowly lift your other foot, allowing your body weight to press your heel down over the edge of the step.  You should feel a stretch in your right / left calf.  Hold this   position for 10 seconds.  Repeat this exercise with a slight bend in your right / left knee. Repeat 3 times. Complete this stretch 2 times per day.   STRENGTHENING EXERCISES - Plantar Fasciitis (Heel Spur Syndrome)  These exercises may help you when beginning to rehabilitate your injury. They may resolve your symptoms with or without further involvement from your physician, physical therapist or athletic trainer. While completing these exercises, remember:   Muscles can gain both the endurance and the strength needed for everyday activities through controlled exercises.  Complete these exercises as instructed by your physician, physical therapist or athletic trainer. Progress the resistance and repetitions only as guided.  STRENGTH -  Towel Curls  Sit in a chair positioned on a non-carpeted surface.  Place your foot on a towel, keeping your heel on the floor.  Pull the towel toward your heel by only curling your toes. Keep your heel on the floor. Repeat 3 times. Complete this exercise 2 times per day.  STRENGTH - Ankle Inversion  Secure one end of a rubber exercise band/tubing to a fixed object (table, pole). Loop the other end around your foot just before your toes.  Place your fists between your knees. This will focus your strengthening at your ankle.  Slowly, pull your big toe up and in, making sure the band/tubing is positioned to resist the entire motion.  Hold this position for 10 seconds.  Have your muscles resist the band/tubing as it slowly pulls your foot back to the starting position. Repeat 3 times. Complete this exercises 2 times per day.  Document Released: 02/15/2005 Document Revised: 05/10/2011 Document Reviewed: 05/30/2008 [employer] Patient Information 2014 [employer]. 

## 2022-03-30 NOTE — Progress Notes (Signed)
Subjective:   Patient ID: Danielle Fox, female   DOB: 72 y.o.   MRN: [id number]   HPI Chief Complaint  Patient presents with   Foot Pain    Left foot pain for about one year. PCP told her that she had plantar fasciitis. She was given exercises to do and also told the pain that she had a heel spur as well. Patient states that it an intermitting type of pain, and she would like to know if this continue to get worst. Pain does have new balances and works five hours a day on feet. She does have tons of pain when she wakes up in the the morning.    72 year old female presents the office for above concerns.  She states that she gets pain in the morning and also if sitting and standing back yet.  No recent injury.  No radiating pain.  Pcp recommended to hold off on NSAIDs    Review of Systems  All other systems reviewed and are negative.  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   Preventative health care 06/29/2016   Pruritus 06/29/2016   Pyloric stenosis in adult 05/2019   Welcome to Medicare preventive visit 06/29/2016    Past Surgical History:  Procedure Laterality Date   ABDOMINAL HYSTERECTOMY     APPENDECTOMY     COLONOSCOPY     01-14-2005   DILATION AND CURETTAGE OF UTERUS     FINE NEEDLE ASPIRATION  02/27/2021   Procedure: FINE NEEDLE ASPIRATION (FNA) LINEAR;  Surgeon: Garner Nash, DO;  Location: [facility];  Service: Pulmonary;;   SINUS IRRIGATION  04/02/2015   TUBAL LIGATION     UPPER GASTROINTESTINAL ENDOSCOPY  11/22/2018   VIDEO BRONCHOSCOPY WITH ENDOBRONCHIAL ULTRASOUND Bilateral 02/27/2021   Procedure: VIDEO BRONCHOSCOPY WITH ENDOBRONCHIAL ULTRASOUND;  Surgeon: Garner Nash, DO;  Location: [facility];  Service: Pulmonary;   Laterality: Bilateral;     Current Outpatient Medications:    budesonide-formoterol (SYMBICORT) 160-4.5 MCG/ACT inhaler, Inhale 2 puffs into the lungs 2 (two) times daily. in the morning and at bedtime., Disp: 10.2 g, Rfl: 5   esomeprazole (NEXIUM) 40 MG capsule, Take 1 capsule (40 mg total) by mouth daily before breakfast., Disp: 90 capsule, Rfl: 3   Multiple Vitamins-Minerals (MULTIVITAMIN WITH MINERALS) tablet, Take 1 tablet by mouth daily., Disp: , Rfl:    terbinafine (LAMISIL) 250 MG tablet, Take 1 tablet (250 mg total) by mouth daily., Disp: 30 tablet, Rfl: 0   Vitamin D, Ergocalciferol, (DRISDOL) 1.25 MG (50000 UNIT) CAPS capsule, Take 1 capsule (50,000 Units total) by mouth every 7 (seven) days., Disp: 12 capsule, Rfl: 0  Allergies  Allergen Reactions   Doxycycline Nausea And Vomiting   Vancomycin     Nausea and abdominal pain   Erythromycin Base Nausea Only           Objective:  Physical Exam  General: AAO x3, NAD  Dermatological: Skin is warm, dry and supple bilateral.  There are no open sores, no preulcerative lesions, no rash or signs of infection present.  Vascular: Dorsalis Pedis artery and Posterior Tibial artery pedal pulses are 2/4 bilateral with immedate capillary fill time.  There is no pain with calf  compression, swelling, warmth, erythema.   Neruologic: Grossly intact via light touch bilateral.  Negative Tinel sign.  Musculoskeletal:Tenderness to palpation along the plantar medial tubercle of the calcaneus at the insertion of plantar fascia on the left foot. There is no pain along the course of the plantar fascia within the arch of the foot. Plantar fascia appears to be intact. There is no pain with lateral compression of the calcaneus or pain with vibratory sensation. There is no pain along the course or insertion of the achilles tendon. No other areas of tenderness to bilateral lower extremities.   Gait: Unassisted, Nonantalgic.       Assessment:    Left foot pain, likely plantar fasciitis     Plan:  -Treatment options discussed including all alternatives, risks, and complications -Etiology of symptoms were discussed -X-rays were obtained and reviewed with the patient.  3 views of the foot were obtained.  Calcaneal spurring is present.  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 -Plantar fascial brace dispensed to help support plantar fascia. -We discussed stretching, icing on a regular basis. -Unfortunately we need to hold off on anti-inflammatories.  Consider steroid injection. -Discussed using good arch support.  No follow-ups on file.  Trula Slade DPM

## 2022-04-12 ENCOUNTER — Encounter (HOSPITAL_BASED_OUTPATIENT_CLINIC_OR_DEPARTMENT_OTHER): Payer: Self-pay

## 2022-04-12 ENCOUNTER — Other Ambulatory Visit: Payer: Self-pay | Admitting: *Deleted

## 2022-04-12 ENCOUNTER — Ambulatory Visit (HOSPITAL_BASED_OUTPATIENT_CLINIC_OR_DEPARTMENT_OTHER)
Admission: RE | Admit: 2022-04-12 | Discharge: 2022-04-12 | Disposition: A | Discharge status: Home / Self Care | Payer: Medicare Other | Source: Ambulatory Visit | Attending: Family | Admitting: Family

## 2022-04-12 ENCOUNTER — Other Ambulatory Visit (INDEPENDENT_AMBULATORY_CARE_PROVIDER_SITE_OTHER): Payer: Medicare Other

## 2022-04-12 DIAGNOSIS — E559 Vitamin D deficiency, unspecified: Secondary | ICD-10-CM | POA: Diagnosis not present

## 2022-04-12 DIAGNOSIS — B351 Tinea unguium: Secondary | ICD-10-CM | POA: Diagnosis not present

## 2022-04-12 DIAGNOSIS — Z1231 Encounter for screening mammogram for malignant neoplasm of breast: Secondary | ICD-10-CM

## 2022-04-12 LAB — HEPATIC FUNCTION PANEL
ALT: 10 U/L (ref 0–35)
AST: 12 U/L (ref 0–37)
Albumin: 4 g/dL (ref 3.5–5.2)
Alkaline Phosphatase: 110 U/L (ref 39–117)
Bilirubin, Direct: 0 mg/dL (ref 0.0–0.3)
Total Bilirubin: 0.4 mg/dL (ref 0.2–1.2)
Total Protein: 6.7 g/dL (ref 6.0–8.3)

## 2022-04-12 LAB — VITAMIN D 25 HYDROXY (VIT D DEFICIENCY, FRACTURES): VITD: 14.33 ng/mL — ABNORMAL LOW (ref 30.00–100.00)

## 2022-04-13 ENCOUNTER — Other Ambulatory Visit: Payer: Self-pay

## 2022-04-13 ENCOUNTER — Other Ambulatory Visit: Payer: Self-pay | Admitting: Family

## 2022-04-13 ENCOUNTER — Telehealth: Payer: Self-pay | Admitting: Family

## 2022-04-13 DIAGNOSIS — B351 Tinea unguium: Secondary | ICD-10-CM

## 2022-04-13 MED ORDER — VITAMIN D (ERGOCALCIFEROL) 1.25 MG (50000 UNIT) PO CAPS: 50000.0000 [IU] | ORAL_CAPSULE | ORAL | 0 refills | Status: DC

## 2022-04-13 MED ORDER — TERBINAFINE HCL 250 MG PO TABS: 250.0000 mg | ORAL_TABLET | Freq: Every day | ORAL | 1 refills | Status: DC

## 2022-04-13 NOTE — Telephone Encounter (Signed)
Patient advised of results and to continue medication.

## 2022-04-13 NOTE — Telephone Encounter (Signed)
Vit D is very low. Continue vit D 50000 iu once weekly for 12 weeks, then repeat vit D level.   The alkaline phosphatase level we rechecked has returned to normal.

## 2022-06-10 ENCOUNTER — Other Ambulatory Visit: Payer: Self-pay | Admitting: Family

## 2022-06-10 DIAGNOSIS — B351 Tinea unguium: Secondary | ICD-10-CM

## 2022-06-15 ENCOUNTER — Telehealth: Payer: Self-pay | Admitting: Family Medicine

## 2022-06-15 ENCOUNTER — Other Ambulatory Visit: Payer: Self-pay

## 2022-06-15 NOTE — Telephone Encounter (Signed)
Prescription Request  06/15/2022  Is this a "Controlled Substance" medicine? No  LOV: Visit date not found  What is the name of the medication or equipment? terbinafine (LAMISIL) 250 MG tablet   Have you contacted your pharmacy to request a refill? Yes   Which pharmacy would you like this sent to?  [id number] [place], [place] - [address] [place] [address] Phone: [phone] Fax: [phone]     Patient notified that their request is being sent to the clinical staff for review and that they should receive a response within 2 business days.   Please advise at [facility] [phone]

## 2022-06-15 NOTE — Telephone Encounter (Signed)
Medication sent.

## 2022-06-21 NOTE — Telephone Encounter (Signed)
Pt called back stating medication had not been sent in yet. After reviewing chart, confirmed that it had not been sent in and advised pt a high priority message would be sent back to get this corrected. Pt acknowledged understanding.

## 2022-06-21 NOTE — Telephone Encounter (Signed)
Called pt was advised per Dr.Blyth this medication terbinafine (LAMISIL) 250 MG isn't Long term medication. We can put a referral in for Podiatrist if she would like. Pt stated she think its ok will wait to see how its goes.

## 2022-06-25 DIAGNOSIS — L308 Other specified dermatitis: Secondary | ICD-10-CM | POA: Diagnosis not present

## 2022-07-07 ENCOUNTER — Telehealth: Payer: Self-pay | Admitting: Family Medicine

## 2022-07-07 NOTE — Telephone Encounter (Signed)
Copied from CRM [id number]. Topic: Medicare AWV >> Jul 07, 2022  1:53 PM Payton Doughty wrote: Reason for CRM: Called patient to schedule Medicare Annual Wellness Visit (AWV). Left message for patient to call back and schedule Medicare Annual Wellness Visit (AWV).  Last date of AWV: 07/16/21  Please schedule an appointment at any time with Donne Anon, CMA  .  If any questions, please contact me.  Thank you ,  Verlee Rossetti; Care Guide Ambulatory Clinical Support [facility] Direct Dial: [phone]

## 2022-07-20 ENCOUNTER — Ambulatory Visit: Payer: Medicare Other | Admitting: Internal Medicine

## 2022-07-27 ENCOUNTER — Encounter (HOSPITAL_BASED_OUTPATIENT_CLINIC_OR_DEPARTMENT_OTHER): Payer: Self-pay

## 2022-07-27 NOTE — Telephone Encounter (Signed)
Patient has been contacted. Nothing further needed.  Closing encounter.

## 2022-07-30 ENCOUNTER — Encounter (HOSPITAL_BASED_OUTPATIENT_CLINIC_OR_DEPARTMENT_OTHER): Payer: Medicare Other

## 2022-07-30 ENCOUNTER — Ambulatory Visit: Payer: Medicare Other | Admitting: Internal Medicine

## 2022-08-02 ENCOUNTER — Telehealth: Payer: Self-pay

## 2022-08-02 NOTE — Telephone Encounter (Signed)
Disregard

## 2022-08-05 ENCOUNTER — Other Ambulatory Visit: Payer: Self-pay

## 2022-08-05 DIAGNOSIS — R053 Chronic cough: Secondary | ICD-10-CM

## 2022-08-10 ENCOUNTER — Other Ambulatory Visit (INDEPENDENT_AMBULATORY_CARE_PROVIDER_SITE_OTHER): Payer: Medicare Other | Admitting: Pharmacist

## 2022-08-10 DIAGNOSIS — J4489 Other specified chronic obstructive pulmonary disease: Secondary | ICD-10-CM

## 2022-08-10 NOTE — Progress Notes (Signed)
08/10/2022 Name: Danielle Fox MRN: [id number] DOB: 11/06/1950  Chief Complaint  Patient presents with   Medication Management    Medication cost    Ceasia NIDRA RULON is a 72 y.o. year old female who presented for a telephone visit.   They were referred to the pharmacist by their PCP for assistance in managing  medication access  / cost .   Subjective:  Medication Access/Adherence  Current Pharmacy:  [facility] [address], [place] - [address] [place] [address] Phone: [phone] Fax: [phone]  [facility] - [place], [place] - [address] [place] [address] Phone: [phone] Fax: [phone]  [id number] - [place], [place] - [address] AT [facility] [address] [place] [address] Phone: [phone] Fax: [phone]   Patient reports affordability concerns with their medications: Yes  reports cost of generic Symbicort inhaler in 2023 was $37 but cost is $47 for 2024. Patient would like to see if there is a lower cost alternative.  Patient reports access/transportation concerns to their pharmacy: No  Patient reports adherence concerns with their medications:  No      COPD: Managed by Dr Marchelle Gearing. Patient also noted to have interstitial lung disease.   Current medications:  Budesonide / formoterol 160/4.5mg  - inhaler 2 puffs twice a day  Medications tried in the past: Breo inhaler, Dulera inhaler, brand Symbicort   Reports no exacerbations in the past year   Objective:  Lab Results  Component Value Date   HGBA1C 5.8 12/02/2020    Lab Results  Component Value Date   CREATININE 1.12 03/09/2022   BUN 16 03/09/2022   NA 140 03/09/2022   K 4.1 03/09/2022   CL 104 03/09/2022   CO2 27 03/09/2022    Lab Results  Component Value Date   CHOL 259 (H) 03/09/2022   HDL 77.60 03/09/2022   LDLCALC 142 (H)  03/09/2022   LDLDIRECT 155.9 12/14/2012   TRIG 197.0 (H) 03/09/2022   CHOLHDL 3 03/09/2022    Medications Reviewed Today     Reviewed by Henrene Pastor, RPH-CPP (Pharmacist) on 08/10/22 at 1347  Med List Status: <None>   Medication Order Taking? Sig Documenting Provider Last Dose Status Informant  budesonide-formoterol (SYMBICORT) 160-4.5 MCG/ACT inhaler [id number] Yes Inhale 2 puffs into the lungs 2 (two) times daily. in the morning and at bedtime. Bradd Canary, MD Taking Active   esomeprazole (NEXIUM) 40 MG capsule [id number] Yes Take 1 capsule (40 mg total) by mouth daily before breakfast. Iva Boop, MD Taking Active Self  Multiple Vitamins-Minerals (MULTIVITAMIN WITH MINERALS) tablet [id number] Yes Take 1 tablet by mouth daily. [provider] Taking Active Self  terbinafine (LAMISIL) 250 MG tablet [id number] No Take 1 tablet (250 mg total) by mouth daily.  Patient not taking: Reported on 08/10/2022   Sandford Craze, NP Not Taking Active   Vitamin D, Ergocalciferol, (DRISDOL) 1.25 MG (50000 UNIT) CAPS capsule [id number] Yes Take 1 capsule (50,000 Units total) by mouth every 7 (seven) days. Sandford Craze, NP Taking Active               Assessment/Plan:   COPD: - Currently controlled.  - Received paperwork from PCP's CMA to request tier exception for budesonide / formoterol 160/4.5 mcg inhaler. Usually a tier exception will be approved if there is a lower cost/ tiered medication that the patient  cannot take.  - Discussed previous therapies with patient and reviewed her 2024 [facility] formulary. There are not any alternatives to generic Symbicort / budesonide + formoterol that are lower than tier 3. I discussed with patient that I will complete the tier exception request and send to [facility] but I am not sure that it will qualify for a tier exception.  - Encourage patient to contact me prior to choosing a plan for 2025 and we can review available plans to see  if there are any with better coverage of budesonide / formoterol.     Follow Up Plan: 2 to 3 weeks  Henrene Pastor, PharmD Clinical Pharmacist [facility]

## 2022-08-27 ENCOUNTER — Ambulatory Visit (INDEPENDENT_AMBULATORY_CARE_PROVIDER_SITE_OTHER): Payer: Medicare Other | Admitting: *Deleted

## 2022-08-27 DIAGNOSIS — Z0000 Encounter for general adult medical examination without abnormal findings: Secondary | ICD-10-CM | POA: Diagnosis not present

## 2022-08-27 NOTE — Patient Instructions (Signed)
Danielle Fox , Thank you for taking time to come for your Medicare Wellness Visit. I appreciate your ongoing commitment to your health goals. Please review the following plan we discussed and let me know if I can assist you in the future.   This is a list of the screening recommended for you and due dates:  Health Maintenance  Topic Date Due   Hepatitis C Screening  Never done   Pneumonia Vaccine (2 of 2 - PPSV23 or PCV20) 06/29/2017   Zoster (Shingles) Vaccine (2 of 2) 05/02/2018   COVID-19 Vaccine (4 - 2023-24 season) 10/30/2021   Screening for Lung Cancer  09/08/2022   Flu Shot  09/30/2022   Medicare Annual Wellness Visit  08/27/2023   Mammogram  04/12/2024   DTaP/Tdap/Td vaccine (3 - Td or Tdap) 06/30/2026   Colon Cancer Screening  11/21/2028   DEXA scan (bone density measurement)  Completed   HPV Vaccine  Aged Out    Next appointment: Follow up in one year for your annual wellness visit.   Preventive Care 72 Years and Older, Female Preventive care refers to lifestyle choices and visits with your health care provider that can promote health and wellness. What does preventive care include? A yearly physical exam. This is also called an annual well check. Dental exams once or twice a year. Routine eye exams. Ask your health care provider how often you should have your eyes checked. Personal lifestyle choices, including: Daily care of your teeth and gums. Regular physical activity. Eating a healthy diet. Avoiding tobacco and drug use. Limiting alcohol use. Practicing safe sex. Taking low-dose aspirin every day. Taking vitamin and mineral supplements as recommended by your health care provider. What happens during an annual well check? The services and screenings done by your health care provider during your annual well check will depend on your age, overall health, lifestyle risk factors, and family history of disease. Counseling  Your health care provider may ask you  questions about your: Alcohol use. Tobacco use. Drug use. Emotional well-being. Home and relationship well-being. Sexual activity. Eating habits. History of falls. Memory and ability to understand (cognition). Work and work Astronomer. Reproductive health. Screening  You may have the following tests or measurements: Height, weight, and BMI. Blood pressure. Lipid and cholesterol levels. These may be checked every 5 years, or more frequently if you are over 72 years old. Skin check. Lung cancer screening. You may have this screening every year starting at age 65 if you have a 30-pack-year history of smoking and currently smoke or have quit within the past 15 years. Fecal occult blood test (FOBT) of the stool. You may have this test every year starting at age 35. Flexible sigmoidoscopy or colonoscopy. You may have a sigmoidoscopy every 5 years or a colonoscopy every 10 years starting at age 64. Hepatitis C blood test. Hepatitis B blood test. Sexually transmitted disease (STD) testing. Diabetes screening. This is done by checking your blood sugar (glucose) after you have not eaten for a while (fasting). You may have this done every 1-3 years. Bone density scan. This is done to screen for osteoporosis. You may have this done starting at age 72. Mammogram. This may be done every 1-2 years. Talk to your health care provider about how often you should have regular mammograms. Talk with your health care provider about your test results, treatment options, and if necessary, the need for more tests. Vaccines  Your health care provider may recommend certain vaccines, such as:  Influenza vaccine. This is recommended every year. Tetanus, diphtheria, and acellular pertussis (Tdap, Td) vaccine. You may need a Td booster every 10 years. Zoster vaccine. You may need this after age 65. Pneumococcal 13-valent conjugate (PCV13) vaccine. One dose is recommended after age 72. Pneumococcal polysaccharide  (PPSV23) vaccine. One dose is recommended after age 72. Talk to your health care provider about which screenings and vaccines you need and how often you need them. This information is not intended to replace advice given to you by your health care provider. Make sure you discuss any questions you have with your health care provider. Document Released: 03/14/2015 Document Revised: 11/05/2015 Document Reviewed: 12/17/2014 Elsevier Interactive Patient Education  2017 [place] Prevention in the Home Falls can cause injuries. They can happen to people of all ages. There are many things you can do to make your home safe and to help prevent falls. What can I do on the outside of my home? Regularly fix the edges of walkways and driveways and fix any cracks. Remove anything that might make you trip as you walk through a door, such as a raised step or threshold. Trim any bushes or trees on the path to your home. Use bright outdoor lighting. Clear any walking paths of anything that might make someone trip, such as rocks or tools. Regularly check to see if handrails are loose or broken. Make sure that both sides of any steps have handrails. Any raised decks and porches should have guardrails on the edges. Have any leaves, snow, or ice cleared regularly. Use sand or salt on walking paths during winter. Clean up any spills in your garage right away. This includes oil or grease spills. What can I do in the bathroom? Use night lights. Install grab bars by the toilet and in the tub and shower. Do not use towel bars as grab bars. Use non-skid mats or decals in the tub or shower. If you need to sit down in the shower, use a plastic, non-slip stool. Keep the floor dry. Clean up any water that spills on the floor as soon as it happens. Remove soap buildup in the tub or shower regularly. Attach bath mats securely with double-sided non-slip rug tape. Do not have throw rugs and other things on the  floor that can make you trip. What can I do in the bedroom? Use night lights. Make sure that you have a light by your bed that is easy to reach. Do not use any sheets or blankets that are too big for your bed. They should not hang down onto the floor. Have a firm chair that has side arms. You can use this for support while you get dressed. Do not have throw rugs and other things on the floor that can make you trip. What can I do in the kitchen? Clean up any spills right away. Avoid walking on wet floors. Keep items that you use a lot in easy-to-reach places. If you need to reach something above you, use a strong step stool that has a grab bar. Keep electrical cords out of the way. Do not use floor polish or wax that makes floors slippery. If you must use wax, use non-skid floor wax. Do not have throw rugs and other things on the floor that can make you trip. What can I do with my stairs? Do not leave any items on the stairs. Make sure that there are handrails on both sides of the stairs and use them.  Fix handrails that are broken or loose. Make sure that handrails are as long as the stairways. Check any carpeting to make sure that it is firmly attached to the stairs. Fix any carpet that is loose or worn. Avoid having throw rugs at the top or bottom of the stairs. If you do have throw rugs, attach them to the floor with carpet tape. Make sure that you have a light switch at the top of the stairs and the bottom of the stairs. If you do not have them, ask someone to add them for you. What else can I do to help prevent falls? Wear shoes that: Do not have high heels. Have rubber bottoms. Are comfortable and fit you well. Are closed at the toe. Do not wear sandals. If you use a stepladder: Make sure that it is fully opened. Do not climb a closed stepladder. Make sure that both sides of the stepladder are locked into place. Ask someone to hold it for you, if possible. Clearly mark and make  sure that you can see: Any grab bars or handrails. First and last steps. Where the edge of each step is. Use tools that help you move around (mobility aids) if they are needed. These include: Canes. Walkers. Scooters. Crutches. Turn on the lights when you go into a dark area. Replace any light bulbs as soon as they burn out. Set up your furniture so you have a clear path. Avoid moving your furniture around. If any of your floors are uneven, fix them. If there are any pets around you, be aware of where they are. Review your medicines with your doctor. Some medicines can make you feel dizzy. This can increase your chance of falling. Ask your doctor what other things that you can do to help prevent falls. This information is not intended to replace advice given to you by your health care provider. Make sure you discuss any questions you have with your health care provider. Document Released: 12/12/2008 Document Revised: 07/24/2015 Document Reviewed: 03/22/2014 Elsevier Interactive Patient Education  2017 [employer].

## 2022-08-27 NOTE — Progress Notes (Signed)
Subjective:   Danielle Fox is a 72 y.o. female who presents for Medicare Annual (Subsequent) preventive examination.  Visit Complete: Virtual  I connected with  [name] on 08/27/22 by a audio enabled telemedicine application and verified that I am speaking with the correct person using two identifiers.  Patient Location: Home  Provider Location: Office/Clinic  I discussed the limitations of evaluation and management by telemedicine. The patient expressed understanding and agreed to proceed.  Patient Medicare AWV questionnaire was completed by the patient on 08/20/22; I have confirmed that all information answered by patient is correct and no changes since this date.  Review of Systems     Cardiac Risk Factors include: advanced age (>21men, >85 women);dyslipidemia     Objective:    Today's Vitals   There is no height or weight on file to calculate BMI.     08/27/2022    1:02 PM 07/16/2021   11:22 AM 02/25/2021    1:28 PM 07/10/2020   10:59 AM 10/08/2019    2:20 PM 10/08/2019    5:42 AM 10/02/2019   12:52 PM  Advanced Directives  Does Patient Have a Medical Advance Directive? Yes Yes No Yes Yes Yes Yes  Type of Estate agent of [place];Living will Healthcare Power of [place];Out of facility DNR (pink MOST or yellow form);Living will Living will Healthcare Power of [place];Living will Healthcare Power of [place];Living will Healthcare Power of [place];Living will Healthcare Power of [place];Living will;Out of facility DNR (pink MOST or yellow form)  Does patient want to make changes to medical advance directive?   No - Patient declined  No - Patient declined No - Patient declined   Copy of Healthcare Power of Attorney in Chart? No - copy requested No - copy requested   No - copy requested No - copy requested   Would patient like information on creating a medical advance directive?   No - Patient declined  No - Patient declined No - Patient  declined     Current Medications (verified) Outpatient Encounter Medications as of 08/27/2022  Medication Sig   budesonide-formoterol (SYMBICORT) 160-4.5 MCG/ACT inhaler Inhale 2 puffs into the lungs 2 (two) times daily. in the morning and at bedtime.   esomeprazole (NEXIUM) 40 MG capsule Take 1 capsule (40 mg total) by mouth daily before breakfast.   Multiple Vitamins-Minerals (MULTIVITAMIN WITH MINERALS) tablet Take 1 tablet by mouth daily.   Vitamin D, Ergocalciferol, (DRISDOL) 1.25 MG (50000 UNIT) CAPS capsule Take 1 capsule (50,000 Units total) by mouth every 7 (seven) days.   [DISCONTINUED] terbinafine (LAMISIL) 250 MG tablet Take 1 tablet (250 mg total) by mouth daily. (Patient not taking: Reported on 08/10/2022)   No facility-administered encounter medications on file as of 08/27/2022.    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   Preventative health care 06/29/2016   Pruritus 06/29/2016   Pyloric stenosis in adult 05/2019   Welcome to Medicare preventive visit 06/29/2016   Past Surgical History:  Procedure Laterality Date   ABDOMINAL HYSTERECTOMY     APPENDECTOMY     COLONOSCOPY     01-14-2005   DILATION AND CURETTAGE  OF UTERUS     FINE NEEDLE ASPIRATION  02/27/2021   Procedure: FINE NEEDLE ASPIRATION (FNA) LINEAR;  Surgeon: Josephine Igo, DO;  Location: [facility];  Service: Pulmonary;;   SINUS IRRIGATION  04/02/2015   TUBAL LIGATION     UPPER GASTROINTESTINAL ENDOSCOPY  11/22/2018   VIDEO BRONCHOSCOPY WITH ENDOBRONCHIAL ULTRASOUND Bilateral 02/27/2021   Procedure: VIDEO BRONCHOSCOPY WITH ENDOBRONCHIAL ULTRASOUND;  Surgeon: Josephine Igo, DO;  Location: [facility];  Service: Pulmonary;  Laterality: Bilateral;   Family History  Problem Relation Age of Onset   Heart disease Mother        cabg, MI   Alcohol abuse Father    Cirrhosis Father    Heart disease Father        MI   Colon cancer Neg Hx    Colon polyps Neg Hx    Esophageal cancer Neg Hx    Stomach cancer Neg Hx    Rectal cancer Neg Hx    Social History   Socioeconomic History   Marital status: Married    Spouse name: Not on file   Number of children: 3   Years of education: Not on file   Highest education level: Not on file  Occupational History   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[employer], retired    Associate Professor: [employer]  Tobacco Use   Smoking status: Former    Packs/day: 1.00    Years: 48.00    Additional pack years: 0.00    Total pack years: 48.00    Types: Cigarettes    Quit date: 02/19/2014    Years since quitting: 8.5   Smokeless tobacco: Never   Tobacco comments:    Quit and has no desire to smoke again,  Vaping Use   Vaping Use: Never used  Substance and Sexual Activity   Alcohol use: Yes    Alcohol/week: 0.0 standard drinks of alcohol    Comment: socially   Drug use: No   Sexual activity: Yes    Partners: Male  Other Topics Concern   Not on file  Social History Narrative   Exercise-- walks dog on occasion   Right Handed   One Story Home   Does not drink caffeine    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   Social Determinants of Health   Financial Resource Strain: Low Risk  (08/20/2022)   Overall Financial Resource Strain (CARDIA)    Difficulty of Paying Living Expenses: Not hard at all  Food Insecurity: No Food Insecurity (08/20/2022)   Hunger Vital Sign    Worried About Running Out of Food in the Last Year: Never true    Ran Out of Food in the Last Year: Never true  Transportation Needs: No Transportation Needs (08/20/2022)   PRAPARE - Administrator, Civil Service (Medical): No    Lack of Transportation (Non-Medical): No   Physical Activity: Insufficiently Active (08/20/2022)   Exercise Vital Sign    Days of Exercise per Week: 1 day    Minutes of Exercise per Session: 40 min  Stress: No Stress Concern Present (08/20/2022)   [employer] of Occupational Health - Occupational Stress Questionnaire    Feeling of Stress : Not at all  Social Connections: Unknown (08/20/2022)   Social Connection and Isolation Panel [NHANES]    Frequency of Communication with Friends and Family: More than three times a week    Frequency of Social Gatherings with Friends and Family: More than three times a week  Attends Religious Services: Not on file    Active Member of Clubs or Organizations: Yes    Attends Banker Meetings: More than 4 times per year    Marital Status: Married    Tobacco Counseling Counseling given: Not Answered Tobacco comments: Quit and has no desire to smoke again,   Clinical Intake:  Pre-visit preparation completed: Yes  Pain : No/denies pain  Nutritional Risks: None Diabetes: No  How often do you need to have someone help you when you read instructions, pamphlets, or other written materials from your doctor or pharmacy?: 1 - Never  Interpreter Needed?: No  Information entered by :: Truth Barot,CMA   Activities of Daily Living    08/20/2022    9:03 AM  In your present state of health, do you have any difficulty performing the following activities:  Hearing? 0  Vision? 0  Difficulty concentrating or making decisions? 0  Walking or climbing stairs? 0  Dressing or bathing? 0  Doing errands, shopping? 0  Preparing Food and eating ? N  Using the Toilet? N  In the past six months, have you accidently leaked urine? N  Do you have problems with loss of bowel control? N  Managing your Medications? N  Managing your Finances? N  Housekeeping or managing your Housekeeping? N    Patient Care Team: Bradd Canary, MD as PCP - General (Family Medicine) Kalman Shan,  MD as Consulting Physician (Pulmonary Disease)  Indicate any recent Medical Services you may have received from other than [facility] providers in the past year (date may be approximate).     Assessment:   This is a routine wellness examination for Charissa.  Hearing/Vision screen No results found.  Dietary issues and exercise activities discussed:     Goals Addressed   None    Depression Screen    08/27/2022    1:01 PM 03/09/2022   12:55 PM 06/09/2021    9:56 AM 07/10/2020   11:00 AM 05/13/2020   11:02 AM 03/08/2019   11:13 AM 12/28/2016    6:21 PM  PHQ 2/9 Scores  PHQ - 2 Score 0 0 0 0 0 0 0    Fall Risk    08/20/2022    9:03 AM 07/16/2021   11:22 AM 06/09/2021    9:53 AM 12/26/2020   12:12 PM 07/10/2020   11:00 AM  Fall Risk   Falls in the past year? 0 0 0 0 0  Number falls in past yr: 0 0 0 0 0  Injury with Fall? 0 0 0 0 0  Risk for fall due to : No Fall Risks No Fall Risks No Fall Risks No Fall Risks   Follow up Falls evaluation completed Falls evaluation completed Falls evaluation completed Falls evaluation completed Falls prevention discussed    MEDICARE RISK AT HOME:   TIMED UP AND GO:  Was the test performed?  No    Cognitive Function:        08/27/2022    1:03 PM  6CIT Screen  What Year? 0 points  What month? 0 points  What time? 0 points  Count back from 20 0 points  Months in reverse 0 points  Repeat phrase 2 points  Total Score 2 points    Immunizations Immunization History  Administered Date(s) Administered   Fluad Quad(high Dose 65+) 01/29/2019, 12/28/2019, 12/02/2020, 01/13/2022   Influenza Split 01/07/2011   Influenza Whole 11/29/2009   Influenza, High Dose Seasonal PF 11/07/2017  Influenza,inj,Quad PF,6+ Mos 12/14/2012   Influenza-Unspecified 12/28/2013, 12/01/2015, 12/25/2016, 12/30/2021   PFIZER(Purple Top)SARS-COV-2 Vaccination 05/06/2019, 06/06/2019, 12/15/2019   Pneumococcal Conjugate-13 06/29/2016   Respiratory Syncytial Virus  Vaccine,Recomb Aduvanted(Arexvy) 03/10/2022   Tdap 03/01/2001, 06/29/2016   Zoster Recombinat (Shingrix) 03/07/2018    TDAP status: Up to date  Flu Vaccine status: Up to date  Pneumococcal vaccine status: Due, Education has been provided regarding the importance of this vaccine. Advised may receive this vaccine at local pharmacy or Health Dept. Aware to provide a copy of the vaccination record if obtained from local pharmacy or Health Dept. Verbalized acceptance and understanding.  Covid-19 vaccine status: Information provided on how to obtain vaccines.   Qualifies for Shingles Vaccine? Yes   Zostavax completed No   Shingrix Completed?: No.    Education has been provided regarding the importance of this vaccine. Patient has been advised to call insurance company to determine out of pocket expense if they have not yet received this vaccine. Advised may also receive vaccine at local pharmacy or Health Dept. Verbalized acceptance and understanding.  Screening Tests Health Maintenance  Topic Date Due   Hepatitis C Screening  Never done   Pneumonia Vaccine 23+ Years old (2 of 2 - PPSV23 or PCV20) 06/29/2017   Zoster Vaccines- Shingrix (2 of 2) 05/02/2018   COVID-19 Vaccine (4 - 2023-24 season) 10/30/2021   Medicare Annual Wellness (AWV)  07/17/2022   Lung Cancer Screening  09/08/2022   INFLUENZA VACCINE  09/30/2022   MAMMOGRAM  04/12/2024   DTaP/Tdap/Td (3 - Td or Tdap) 06/30/2026   Colonoscopy  11/21/2028   DEXA SCAN  Completed   HPV VACCINES  Aged Out    Health Maintenance  Health Maintenance Due  Topic Date Due   Hepatitis C Screening  Never done   Pneumonia Vaccine 32+ Years old (2 of 2 - PPSV23 or PCV20) 06/29/2017   Zoster Vaccines- Shingrix (2 of 2) 05/02/2018   COVID-19 Vaccine (4 - 2023-24 season) 10/30/2021   Medicare Annual Wellness (AWV)  07/17/2022   Lung Cancer Screening  09/08/2022    Colorectal cancer screening: Type of screening: Colonoscopy. Completed  11/22/18. Repeat every 10 years  Mammogram status: Completed 04/12/22. Repeat every year  Bone Density status: Completed 01/12/21. Results reflect: Bone density results: OSTEOPENIA. Repeat every 2 years.  Lung Cancer Screening: (Low Dose CT Chest recommended if Age 5-80 years, 20 pack-year currently smoking OR have quit w/in 15years.) does qualify.   Lung Cancer Screening Referral: Last scan completed on 09/07/21  Additional Screening:  Hepatitis C Screening: does qualify; Completed N/a  Vision Screening: Recommended annual ophthalmology exams for early detection of glaucoma and other disorders of the eye. Is the patient up to date with their annual eye exam?  Yes  Who is the provider or what is the name of the office in which the patient attends annual eye exams? [facility]. If pt is not established with a provider, would they like to be referred to a provider to establish care? No .   Dental Screening: Recommended annual dental exams for proper oral hygiene  Diabetic Foot Exam: N/a  Community Resource Referral / Chronic Care Management: CRR required this visit?  No   CCM required this visit?  No     Plan:     I have personally reviewed and noted the following in the patient's chart:   Medical and social history Use of alcohol, tobacco or illicit drugs  Current medications and supplements including opioid  prescriptions. Patient is not currently taking opioid prescriptions. Functional ability and status Nutritional status Physical activity Advanced directives List of other physicians Hospitalizations, surgeries, and ER visits in previous 12 months Vitals Screenings to include cognitive, depression, and falls Referrals and appointments  In addition, I have reviewed and discussed with patient certain preventive protocols, quality metrics, and best practice recommendations. A written personalized care plan for preventive services as well as general preventive health  recommendations were provided to patient.     Donne Anon, CMA   08/27/2022   After Visit Summary: (MyChart) Due to this being a telephonic visit, the after visit summary with patients personalized plan was offered to patient via MyChart   Nurse Notes: None

## 2022-09-06 NOTE — Assessment & Plan Note (Signed)
Encourage heart healthy diet such as MIND or DASH diet, increase exercise, avoid trans fats, simple carbohydrates and processed foods, consider a krill or fish or flaxseed oil cap daily. Declines labs today ?

## 2022-09-06 NOTE — Assessment & Plan Note (Signed)
Hydrate and monitor 

## 2022-09-06 NOTE — Progress Notes (Unsigned)
Subjective:    Patient ID: Danielle Fox, female    DOB: 14-Dec-1950, 72 y.o.   MRN: [id number]  No chief complaint on file.   HPI Discussed the use of AI scribe software for clinical note transcription with the patient, who gave verbal consent to proceed.  History of Present Illness        Patient is a 72 yo female in today for follow up on chronic medical concerns. No recent febrile illness or hospitalizations. Denies CP/palp/SOB/HA/congestion/fevers/GI or GU c/o. 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   Preventative health care 06/29/2016   Pruritus 06/29/2016   Pyloric stenosis in adult 05/2019   Welcome to Medicare preventive visit 06/29/2016    Past Surgical History:  Procedure Laterality Date   ABDOMINAL HYSTERECTOMY     APPENDECTOMY     COLONOSCOPY     01-14-2005   DILATION AND CURETTAGE OF UTERUS     FINE NEEDLE ASPIRATION  02/27/2021   Procedure: FINE NEEDLE ASPIRATION (FNA) LINEAR;  Surgeon: Josephine Igo, DO;  Location: [facility];  Service: Pulmonary;;   SINUS IRRIGATION  04/02/2015   TUBAL LIGATION     UPPER GASTROINTESTINAL ENDOSCOPY  11/22/2018   VIDEO BRONCHOSCOPY WITH ENDOBRONCHIAL ULTRASOUND Bilateral 02/27/2021   Procedure: VIDEO BRONCHOSCOPY WITH ENDOBRONCHIAL ULTRASOUND;  Surgeon: Josephine Igo, DO;  Location: [facility];  Service: Pulmonary;  Laterality: Bilateral;    Family History  Problem Relation Age of Onset   Heart disease Mother        cabg, MI   Alcohol abuse Father    Cirrhosis Father    Heart disease Father        MI   Colon cancer Neg Hx    Colon polyps Neg Hx    Esophageal cancer Neg Hx    Stomach cancer Neg Hx    Rectal cancer Neg Hx     Social History    Socioeconomic History   Marital status: Married    Spouse name: Not on file   Number of children: 3   Years of education: Not on file   Highest education level: Associate degree: occupational, Scientist, product/process development, or vocational program  Occupational History   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[employer], retired    Associate Professor: Print production planner  Tobacco Use   Smoking status: Former    Packs/day: 1.00    Years: 48.00    Additional pack years: 0.00    Total pack years: 48.00    Types: Cigarettes    Quit date: 02/19/2014    Years since quitting: 8.5   Smokeless tobacco: Never   Tobacco comments:    Quit and has no desire to smoke again,  Vaping Use   Vaping Use: Never used  Substance and Sexual Activity   Alcohol use: Yes    Alcohol/week: 0.0 standard drinks of alcohol    Comment: socially   Drug use: No   Sexual activity: Yes    Partners: Male  Other Topics Concern   Not on file  Social History Narrative   Exercise-- walks dog on occasion   Right Handed   One Story Home   Does not drink caffeine    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   Social Determinants of Health   Financial Resource Strain: Low Risk  (08/20/2022)   Overall Financial Resource Strain (CARDIA)    Difficulty of Paying Living Expenses: Not hard at all  Food Insecurity: No Food Insecurity (08/31/2022)   Hunger Vital Sign    Worried About Running Out of Food in the Last Year: Never true    Ran Out of Food in the Last Year: Never true  Transportation Needs: No Transportation Needs (08/31/2022)   PRAPARE - Administrator, Civil Service (Medical): No    Lack of Transportation (Non-Medical): No  Physical Activity: Insufficiently Active (08/31/2022)   Exercise Vital Sign    Days of Exercise per Week: 1 day    Minutes of Exercise per Session: 30 min  Stress: No Stress Concern Present (08/31/2022)   [employer] of Occupational Health - Occupational Stress Questionnaire    Feeling of Stress : Not at all  Social  Connections: Socially Integrated (08/31/2022)   Social Connection and Isolation Panel [NHANES]    Frequency of Communication with Friends and Family: More than three times a week    Frequency of Social Gatherings with Friends and Family: More than three times a week    Attends Religious Services: 1 to 4 times per year    Active Member of [employer] or Organizations: Yes    Attends Engineer, structural: More than 4 times per year    Marital Status: Married  Catering manager Violence: Not At Risk (08/27/2022)   Humiliation, Afraid, Rape, and Kick questionnaire    Fear of Current or Ex-Partner: No    Emotionally Abused: No    Physically Abused: No    Sexually Abused: No    Outpatient Medications Prior to Visit  Medication Sig Dispense Refill   budesonide-formoterol (SYMBICORT) 160-4.5 MCG/ACT inhaler Inhale 2 puffs into the lungs 2 (two) times daily. in the morning and at bedtime. 10.2 g 5   esomeprazole (NEXIUM) 40 MG capsule Take 1 capsule (40 mg total) by mouth daily before breakfast. 90 capsule 3   Multiple Vitamins-Minerals (MULTIVITAMIN WITH MINERALS) tablet Take 1 tablet by mouth daily.     Vitamin D, Ergocalciferol, (DRISDOL) 1.25 MG (50000 UNIT) CAPS capsule Take 1 capsule (50,000 Units total) by mouth every 7 (seven) days. 12 capsule 0   No facility-administered medications prior to visit.    Allergies  Allergen Reactions   Doxycycline Nausea And Vomiting   Vancomycin     Nausea and abdominal pain   Erythromycin Base Nausea Only    Review of Systems  Constitutional:  Negative for fever and malaise/fatigue.  HENT:  Negative for congestion.   Eyes:  Negative for blurred vision.  Respiratory:  Negative for shortness of breath.   Cardiovascular:  Negative for chest pain, palpitations and leg swelling.  Gastrointestinal:  Negative for abdominal pain, blood in stool and nausea.  Genitourinary:  Negative for dysuria and frequency.  Musculoskeletal:  Negative for falls.   Skin:  Negative for rash.  Neurological:  Negative for dizziness, loss of consciousness and headaches.  Endo/Heme/Allergies:  Negative for environmental allergies.  Psychiatric/Behavioral:  Negative for depression. The patient is not nervous/anxious.        Objective:    Physical Exam Constitutional:      General: She is not in acute distress.    Appearance: Normal appearance. She is well-developed. She is not toxic-appearing.  HENT:     Head: Normocephalic  and atraumatic.     Right Ear: External ear normal.     Left Ear: External ear normal.     Nose: Nose normal.  Eyes:     General:        Right eye: No discharge.        Left eye: No discharge.     Conjunctiva/sclera: Conjunctivae normal.  Neck:     Thyroid: No thyromegaly.  Cardiovascular:     Rate and Rhythm: Normal rate and regular rhythm.     Heart sounds: Normal heart sounds. No murmur heard. Pulmonary:     Effort: Pulmonary effort is normal. No respiratory distress.     Breath sounds: Normal breath sounds.  Abdominal:     General: Bowel sounds are normal.     Palpations: Abdomen is soft.     Tenderness: There is no abdominal tenderness. There is no guarding.  Musculoskeletal:        General: Normal range of motion.     Cervical back: Neck supple.  Lymphadenopathy:     Cervical: No cervical adenopathy.  Skin:    General: Skin is warm and dry.  Neurological:     Mental Status: She is alert and oriented to person, place, and time.  Psychiatric:        Mood and Affect: Mood normal.        Behavior: Behavior normal.        Thought Content: Thought content normal.        Judgment: Judgment normal.     There were no vitals taken for this visit. Wt Readings from Last 3 Encounters:  03/22/22 121 lb (54.9 kg)  03/16/22 121 lb (54.9 kg)  03/09/22 123 lb (55.8 kg)    Diabetic Foot Exam - Simple   No data filed    Lab Results  Component Value Date   WBC 11.5 (H) 03/09/2022   HGB 14.0 03/09/2022   HCT  43.1 03/09/2022   PLT 260.0 03/09/2022   GLUCOSE 83 03/09/2022   CHOL 259 (H) 03/09/2022   TRIG 197.0 (H) 03/09/2022   HDL 77.60 03/09/2022   LDLDIRECT 155.9 12/14/2012   LDLCALC 142 (H) 03/09/2022   ALT 10 04/12/2022   AST 12 04/12/2022   NA 140 03/09/2022   K 4.1 03/09/2022   CL 104 03/09/2022   CREATININE 1.12 03/09/2022   BUN 16 03/09/2022   CO2 27 03/09/2022   TSH 2.27 03/09/2022   INR 1.0 09/27/2019   HGBA1C 5.8 12/02/2020    Lab Results  Component Value Date   TSH 2.27 03/09/2022   Lab Results  Component Value Date   WBC 11.5 (H) 03/09/2022   HGB 14.0 03/09/2022   HCT 43.1 03/09/2022   MCV 88.6 03/09/2022   PLT 260.0 03/09/2022   Lab Results  Component Value Date   NA 140 03/09/2022   K 4.1 03/09/2022   CO2 27 03/09/2022   GLUCOSE 83 03/09/2022   BUN 16 03/09/2022   CREATININE 1.12 03/09/2022   BILITOT 0.4 04/12/2022   ALKPHOS 110 04/12/2022   AST 12 04/12/2022   ALT 10 04/12/2022   PROT 6.7 04/12/2022   ALBUMIN 4.0 04/12/2022   CALCIUM 9.6 03/09/2022   ANIONGAP 13 10/12/2019   GFR 49.42 (L) 03/09/2022   Lab Results  Component Value Date   CHOL 259 (H) 03/09/2022   Lab Results  Component Value Date   HDL 77.60 03/09/2022   Lab Results  Component Value Date   LDLCALC 142 (H)  03/09/2022   Lab Results  Component Value Date   TRIG 197.0 (H) 03/09/2022   Lab Results  Component Value Date   CHOLHDL 3 03/09/2022   Lab Results  Component Value Date   HGBA1C 5.8 12/02/2020       Assessment & Plan:  Hyperlipidemia, mild Assessment & Plan: Encourage heart healthy diet such as MIND or DASH diet, increase exercise, avoid trans fats, simple carbohydrates and processed foods, consider a krill or fish or flaxseed oil cap daily. Declines labs today   Vitamin D deficiency Assessment & Plan: Supplement and monitor   Renal insufficiency Assessment & Plan: Hydrate and monitor      Assessment and Plan              Danise Edge,  MD

## 2022-09-06 NOTE — Assessment & Plan Note (Signed)
Supplement and monitor 

## 2022-09-06 NOTE — Assessment & Plan Note (Signed)
Encouraged to get adequate exercise, calcium and vitamin d intake 

## 2022-09-07 ENCOUNTER — Ambulatory Visit (HOSPITAL_BASED_OUTPATIENT_CLINIC_OR_DEPARTMENT_OTHER)
Admission: RE | Admit: 2022-09-07 | Discharge: 2022-09-07 | Disposition: A | Discharge status: Home / Self Care | Payer: Medicare Other | Source: Ambulatory Visit | Attending: Family Medicine | Admitting: Family Medicine

## 2022-09-07 ENCOUNTER — Ambulatory Visit (INDEPENDENT_AMBULATORY_CARE_PROVIDER_SITE_OTHER): Payer: Medicare Other | Admitting: Family Medicine

## 2022-09-07 ENCOUNTER — Encounter: Payer: Self-pay | Admitting: Family Medicine

## 2022-09-07 VITALS — BP 94/68 | HR 78 | Temp 97.8°F | Resp 16 | Ht 61.0 in | Wt 120.4 lb

## 2022-09-07 DIAGNOSIS — E785 Hyperlipidemia, unspecified: Secondary | ICD-10-CM | POA: Diagnosis not present

## 2022-09-07 DIAGNOSIS — N9489 Other specified conditions associated with female genital organs and menstrual cycle: Secondary | ICD-10-CM | POA: Diagnosis not present

## 2022-09-07 DIAGNOSIS — E559 Vitamin D deficiency, unspecified: Secondary | ICD-10-CM | POA: Insufficient documentation

## 2022-09-07 DIAGNOSIS — N838 Other noninflammatory disorders of ovary, fallopian tube and broad ligament: Secondary | ICD-10-CM | POA: Diagnosis not present

## 2022-09-07 DIAGNOSIS — N289 Disorder of kidney and ureter, unspecified: Secondary | ICD-10-CM

## 2022-09-07 DIAGNOSIS — D72829 Elevated white blood cell count, unspecified: Secondary | ICD-10-CM | POA: Diagnosis not present

## 2022-09-07 LAB — CBC WITH DIFFERENTIAL/PLATELET
Basophils Absolute: 0.2 10*3/uL — ABNORMAL HIGH (ref 0.0–0.1)
Basophils Relative: 2.6 % (ref 0.0–3.0)
Eosinophils Absolute: 1.1 10*3/uL — ABNORMAL HIGH (ref 0.0–0.7)
Eosinophils Relative: 15.8 % — ABNORMAL HIGH (ref 0.0–5.0)
HCT: 43.7 % (ref 36.0–46.0)
Hemoglobin: 14.1 g/dL (ref 12.0–15.0)
Lymphocytes Relative: 23.2 % (ref 12.0–46.0)
Lymphs Abs: 1.7 10*3/uL (ref 0.7–4.0)
MCHC: 32.3 g/dL (ref 30.0–36.0)
MCV: 88.2 fl (ref 78.0–100.0)
Monocytes Absolute: 0.6 10*3/uL (ref 0.1–1.0)
Monocytes Relative: 8.8 % (ref 3.0–12.0)
Neutro Abs: 3.6 10*3/uL (ref 1.4–7.7)
Neutrophils Relative %: 49.6 % (ref 43.0–77.0)
Platelets: 240 10*3/uL (ref 150.0–400.0)
RBC: 4.96 Mil/uL (ref 3.87–5.11)
RDW: 15.1 % (ref 11.5–15.5)
WBC: 7.2 10*3/uL (ref 4.0–10.5)

## 2022-09-07 LAB — COMPREHENSIVE METABOLIC PANEL
ALT: 13 U/L (ref 0–35)
AST: 17 U/L (ref 0–37)
Albumin: 4.3 g/dL (ref 3.5–5.2)
Alkaline Phosphatase: 98 U/L (ref 39–117)
BUN: 13 mg/dL (ref 6–23)
CO2: 30 mEq/L (ref 19–32)
Calcium: 10.1 mg/dL (ref 8.4–10.5)
Chloride: 102 mEq/L (ref 96–112)
Creatinine, Ser: 0.95 mg/dL (ref 0.40–1.20)
GFR: 60 mL/min — ABNORMAL LOW (ref >60.00)
Glucose, Bld: 83 mg/dL (ref 70–99)
Potassium: 4.4 mEq/L (ref 3.5–5.1)
Sodium: 140 mEq/L (ref 135–145)
Total Bilirubin: 0.6 mg/dL (ref 0.2–1.2)
Total Protein: 6.9 g/dL (ref 6.0–8.3)

## 2022-09-07 LAB — LIPID PANEL
Cholesterol: 268 mg/dL — ABNORMAL HIGH (ref 0–200)
HDL: 75.8 mg/dL (ref >39.00)
LDL Cholesterol: 163 mg/dL — ABNORMAL HIGH (ref 0–99)
NonHDL: 192.68
Total CHOL/HDL Ratio: 4
Triglycerides: 147 mg/dL (ref 0.0–149.0)
VLDL: 29.4 mg/dL (ref 0.0–40.0)

## 2022-09-07 LAB — VITAMIN D 25 HYDROXY (VIT D DEFICIENCY, FRACTURES): VITD: 50.1 ng/mL (ref 30.00–100.00)

## 2022-09-07 LAB — TSH: TSH: 1.79 u[IU]/mL (ref 0.35–5.50)

## 2022-09-07 MED ORDER — HYDROXYZINE HCL 10 MG PO TABS: 10.0000 mg | ORAL_TABLET | Freq: Three times a day (TID) | ORAL | 1 refills | Status: DC | PRN

## 2022-09-07 NOTE — Patient Instructions (Addendum)
Vitamin D 3  2000 international units  daily  [employer] on netflix  Schedule pelvic ultrasound

## 2022-09-12 ENCOUNTER — Other Ambulatory Visit: Payer: Self-pay | Admitting: Family Medicine

## 2022-09-12 DIAGNOSIS — N9489 Other specified conditions associated with female genital organs and menstrual cycle: Secondary | ICD-10-CM

## 2022-09-14 DIAGNOSIS — K080 Exfoliation of teeth due to systemic causes: Secondary | ICD-10-CM | POA: Diagnosis not present

## 2022-09-20 ENCOUNTER — Encounter: Payer: Self-pay | Admitting: Family Medicine

## 2022-10-18 ENCOUNTER — Encounter: Payer: Self-pay | Admitting: Pharmacist

## 2022-10-18 NOTE — Progress Notes (Signed)
   10/18/2022 Name: Danielle Fox MRN: [id number] DOB: 09/16/1950  Chief Complaint  Patient presents with   Medication Problem    Tier exception    Danielle Fox is a 72 y.o. year old female who presented for a telephone visit.   They were referred to the pharmacist by their PCP for assistance in managing  medication access  / cost .   Subjective:  Medication Access/Adherence  Current Pharmacy:  [facility] [address] [place] - [address] [place] [address] Phone: [phone] Fax: [phone]  [facility] - [place], [place] - [address] [place] [address] Phone: [phone] Fax: [phone]  [id number] - [place], [place] - [address] AT [facility] [address] [place] [address] Phone: [phone] Fax: [phone]   Patient reports affordability concerns with their medications: Yes  reports cost of generic Symbicort inhaler in 2023 was $37 but cost is $47 for 2024. Patient would like to see if there is a lower cost alternative.  Patient reports access/transportation concerns to their pharmacy: No  Patient reports adherence concerns with their medications:  No      COPD: Managed by Dr Marchelle Gearing. Patient also noted to have interstitial lung disease.   Current medications:  Budesonide / formoterol 160/4.5mg  - inhaler 2 puffs twice a day  Medications tried in the past: Breo inhaler, Dulera inhaler, brand Symbicort   Reports no exacerbations in the past year  Submitted tier exception in June 2024 but never received confirmation if was approved or denied.    Objective:  Lab Results  Component Value Date   HGBA1C 5.8 12/02/2020    Lab Results  Component Value Date   CREATININE 0.95 09/07/2022   BUN 13 09/07/2022   NA 140 09/07/2022   K 4.4 09/07/2022   CL 102 09/07/2022   CO2 30 09/07/2022    Lab Results   Component Value Date   CHOL 268 (H) 09/07/2022   HDL 75.80 09/07/2022   LDLCALC 163 (H) 09/07/2022   LDLDIRECT 155.9 12/14/2012   TRIG 147.0 09/07/2022   CHOLHDL 4 09/07/2022    Medications Reviewed Today   Medications were not reviewed in this encounter       Assessment/Plan:   COPD: - Currently controlled.  - Called [employer] to see if tier exception was approved. Tier exception was but they were not able to tell me what copay was. Called [facility] and cost of budesonide / formoterol decrease from $47 to $6 per refill. Patient is already aware.    Follow Up Plan: in 2024 to see what copy will be and if copay exception will be needed again  Henrene Pastor, PharmD Clinical Pharmacist [facility]

## 2022-10-25 ENCOUNTER — Other Ambulatory Visit: Payer: Self-pay | Admitting: Internal Medicine

## 2022-10-25 ENCOUNTER — Ambulatory Visit: Payer: Medicare Other | Admitting: Internal Medicine

## 2022-10-25 ENCOUNTER — Ambulatory Visit (INDEPENDENT_AMBULATORY_CARE_PROVIDER_SITE_OTHER): Payer: Medicare Other | Admitting: Internal Medicine

## 2022-10-25 ENCOUNTER — Encounter: Payer: Self-pay | Admitting: Internal Medicine

## 2022-10-25 VITALS — BP 98/64 | HR 90 | Temp 98.0°F | Ht 61.0 in | Wt 117.4 lb

## 2022-10-25 DIAGNOSIS — R590 Localized enlarged lymph nodes: Secondary | ICD-10-CM

## 2022-10-25 DIAGNOSIS — J439 Emphysema, unspecified: Secondary | ICD-10-CM | POA: Diagnosis not present

## 2022-10-25 DIAGNOSIS — Z87891 Personal history of nicotine dependence: Secondary | ICD-10-CM

## 2022-10-25 DIAGNOSIS — J849 Interstitial pulmonary disease, unspecified: Secondary | ICD-10-CM | POA: Diagnosis not present

## 2022-10-25 LAB — PULMONARY FUNCTION TEST
DL/VA % pred: 97 %
DL/VA: 4.13 ml/min/mmHg/L
DLCO cor % pred: 91 %
DLCO cor: 15.68 ml/min/mmHg
DLCO unc % pred: 93 %
DLCO unc: 16.01 ml/min/mmHg
FEF 25-75 Pre: 0.77 L/s
FEF2575-%Pred-Pre: 48 %
FEV1-%Pred-Pre: 75 %
FEV1-Pre: 1.42 L
FEV1FVC-%Pred-Pre: 88 %
FEV6-%Pred-Pre: 87 %
FEV6-Pre: 2.1 L
FEV6FVC-%Pred-Pre: 103 %
FVC-%Pred-Pre: 84 %
FVC-Pre: 2.12 L
Pre FEV1/FVC ratio: 67 %
Pre FEV6/FVC Ratio: 99 %

## 2022-10-25 NOTE — Patient Instructions (Addendum)
Hilar adenopathy & Mediastinal adenopathy Abnormal PET scan of mediastinum Nov 2022. 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  -PET scan November 2023 still shows hyperactive lymph nodes but stable in size x 1 year -Stability with nondiagnostic bronchoscopy 1 year ago suggest reactive process versus sarcoid = Last CT scan of the chest July 2023  Plan  - Sharedd ecision making for continued monitoring approach  -Will hold off on referral to thoracic surgery for mediastinal -Do  CT scan of the chest without contrast [preferably high-resolution] in the next few weeks  ILD (interstitial lung disease) (HCC) -absent 2019 and seen first in 2021 and then February 2023 -> stable end of 2023/early 2024 - indeterminate for UIP July 2023CT  -Minimal to no symptoms as of August 2024 visit -Continued lung function stability/improvement as of August 2024 compared August 2023 -Lung function normal -Some crackles are suggesting some amount of ILD still present but stable -Last CT scan of the chest July 2023.  Plan -Do high-resolution CT chest supine and prone in the next 4 to 8 weeks  - repeat spiro/dlco In 8 months  --No antifibrotic's or biopsy at this visit [per shared decision making]   Pulmonary emphysema  PFT normal  Plan  - continue symbicort scheduled with albuterol as needed - next visit can look at reducing to sporivan   Followup Do CT scan of the chest in the next few weeks and then we will call with the results 8 months 15-minute visit but after spirometry and DLCO

## 2022-10-25 NOTE — Progress Notes (Signed)
IOV Dr Tonia Brooms Dec 2022   This is a 72 year old female, past medical history of COPD, prior positive PPD.Present for evaluation of lung nodule.  Patient was enrolled in lung cancer screening program had an abnormal lung cancer screening CT on 01/12/2021.  Had mild associated mediastinal adenopathy she had multiple small but hypermetabolic mediastinal and hilar nodes.  Additionally a small right supraclavicular cervical and left axillary node.  Other small bilateral multiple pulmonary nodules.  Concerning for possible underlying granulomatous disease.  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 Epimenio Foot a few years prior.  She has subsequently recovered from this.  She also had a recent URI and was treated with antibiotics and steroids.  This is getting better.   OV 03/13/2021: Here today for follow-up after bronchoscopy.  Patient was taken for bronchoscopy following nuclear medicine pet imaging which revealed hypermetabolic adenopathy within the chest.  Tissue sampling of the lymph nodes were all negative.  Flow cytometry also revealed no polyclonal or monoclonal proliferations of lymphocytes.  No evidence of lymphoma.  There was also no granulomatous disease noted.  We reviewed her pathology results today in the office which all suggest a reactive adenopathy.   OV 06/01/2021: Here today for follow-up after recent CT scan of the chest.  Thankfully lymphadenopathy is stable.  She otherwise feels great.  Leaving soon to go spend a couple of weeks and [place] with family.  From respiratory standpoint she is able to get around do everything that she wants to do.  She was happy to receive the news.  Also discussed importance of repeat follow-up imaging with prominent adenopathy.  I do suspect she is probably dealing with sarcoidosis and we just did not make a diagnosis on last bronchoscopy.  As long as her lymph nodes are stable I think we can avoid repeat procedure versus even  consideration for mediastinoscopy.  We talked about this today in the office.   OV 09/22/2021: Here today for follow-up after repeat CT chest to follow lymphadenopathy and progressive fibrosis. Lymphadenopathy is unchanged from prior however overall shows progression of fibrosis from initial scans in 2022. She does report increased exertional dyspnea at this time and uses albuterol inhaler twice daily now, was previously using this just once. She notes that between the heat which exacerbates her dyspnea and the recent increase in environmental trigger ([place] smoke) it has seemed worse. Overall clinical suspicion still remains high for sarcoidosis though we have not been able to confirm this at this point. We discussed a referral to the ILD clinic for further recommendations re: treating prophylactically with immune modulators, anti-fibrotic medications versus further work-up which she is in agreement with.       OV 10/15/2021 - transfe of care from Dr Tonia Brooms to DR Marchelle Gearing  Subjective:  Patient ID: Danielle Fox, female , DOB: 07/09/1950 , age 72 y.o. , MRN: [id number] , ADDRESS: [address] [place] [address] PCP Bradd Canary, MD Patient Care Team: Bradd Canary, MD as PCP - General (Family Medicine)  This Provider for this visit: Treatment Team:  Attending Provider: Kalman Shan, MD    10/15/2021 -   Chief Complaint  Patient presents with   Consult    Pt had recent CT performed and is here today to have the results discussed. Pt denies any current complaints of cough, SOB, or chest discomfort.     HPI Danielle Fox 72 y.o. -reports that 2 years  ago had encephalitis (reivew of chart suggest stroke). Prior smoker. [place] Parents smoked heavily.  Says in after mtath of neuro issue CT was abnormal. Diffuse adenopathy esp mediastinal Nov 2022 - now non diagnostic early 2023. Consideration of sarcoid  On followup. Dr Tonia Brooms has  reocmmended followup PET. Had CT - shows ILD - in my view very mild and c/w indeterminate for UIP pattern. SErioogy negative. Has only mild DOE and somecough. Uses cymbicort. PFT shows obsstruction interestingly with BD response and normal DLCO . Cough is mild only. She has upcoming travel with family to [place] Sept 2023 and wants to hold off majore workup     CT chest 09/07/21  Narrative & Impression  CLINICAL DATA:  Abnormal CT chest. Evaluate for interstitial lung disease.   EXAM: CT CHEST WITHOUT CONTRAST   TECHNIQUE: Multidetector CT imaging of the chest was performed following the standard protocol without intravenous contrast. High resolution imaging of the lungs, as well as inspiratory and expiratory imaging, was performed.   RADIATION DOSE REDUCTION: This exam was performed according to the departmental dose-optimization program which includes automated exposure control, adjustment of the mA and/or kV according to patient size and/or use of iterative reconstruction technique.   COMPARISON:  04/20/2021, PET 01/27/2021, CT chest 01/12/2021 and 09/29/2019.   FINDINGS: Cardiovascular: Atherosclerotic calcification of the aorta. Heart size normal. No pericardial effusion.   Mediastinum/Nodes: Mediastinal adenopathy is again seen with an index low right paratracheal lymph node measuring 11 mm, as before. Hilar regions are difficult to evaluate without IV contrast. No axillary adenopathy. Esophagus is grossly unremarkable.   Lungs/Pleura: Biapical pleuroparenchymal scarring. Centrilobular and paraseptal emphysema. Patchy pulmonary parenchymal ground-glass is predominantly peripheral in location and without a definite craniocaudal gradient. A few scattered subpleural reticular densities are seen. Mild traction bronchiolectasis. Possible early honeycombing in the posterior right lower lobe (7/77), versus paraseptal emphysema. Findings are similar to 04/20/2021  but progressive from 01/12/2021. Millimetric pulmonary nodules, as before. No suspicious pulmonary nodules. Scattered mucoid impaction. No pleural fluid. Airway is unremarkable. No air trapping.   Upper Abdomen: Visualized portions of the liver, gallbladder, adrenal glands, kidneys, spleen, pancreas, stomach and bowel are grossly unremarkable. Upper abdominal lymph nodes are not enlarged by CT size criteria.   Musculoskeletal: Mild degenerative changes in the spine. Old left rib fracture. No worrisome lytic or sclerotic lesions.   IMPRESSION: 1. Pulmonary parenchymal pattern of fibrosis appears similar to 04/20/2021 but is progressive from 01/12/2021. Findings may be due to sarcoid. Nonspecific interstitial pneumonitis is considered less likely given progression. Findings are indeterminate for UIP per consensus guidelines: Diagnosis of Idiopathic Pulmonary Fibrosis: An Official ATS/ERS/JRS/ALAT Clinical Practice Guideline. Am Rosezetta Schlatter Crit Care Med Vol 198, Iss 5, [phone], Oct 30 2016. 2. Mediastinal adenopathy, possibly due to sarcoid and previously evaluated on PET 01/27/2021. 3.  Aortic atherosclerosis (ICD10-I70.0). 4.  Emphysema (ICD10-J43.9).     Electronically Signed   By: Leanna Battles M.D.   On: 09/07/2021 16:38    PFT  OV 03/16/2022  Subjective:  Patient ID: Danielle Fox, female , DOB: [birth year] , age 68 y.o. , MRN: [id number] , ADDRESS: [address] [place] [address] PCP Bradd Canary, MD Patient Care Team: Bradd Canary, MD as PCP - General (Family Medicine)  This Provider for this visit: Treatment Team:  Attending Provider: Kalman Shan, MD    03/16/2022 -   Chief Complaint  Patient presents with   Follow-up  PFT     HPI Danielle Fox 72 y.o. -  #Follow-up mediastinal adenopathy [PET active November 2022 nondiagnostic bronchoscopy January 2023 and considered reactive by Dr. Audie Box early 2023.  Normal ACE  level]   -She had an updated PET scan in November 2023.  Meters adenopathy is still hyperactive but there is no change in size.  #ILD:  -She went to [place] in September 2023.  She walked a lot.  She says she was not any more dyspneic than her sisters.  She barely has any dyspnea.  She feels stable.  We reviewed her latest pulmonary function test that shows stability.  Late last year she had CT scan of the chest that I personally visualized with her and agree with interpretation.  She has early/mild ILD.  She does not want to do any biopsy.  She does not want to start antifibrotic's.  Her current symptom scores are reflected below.  #Other abnormal findings on the PET scan  -Adnexal mass present on the PET scan.  At the time I shared these results with the primary care office.  I reviewed the external records.  She is on [facility] primary care nurse practitioner recently.  She says she does NOT want to address adnexal mass with an ultrasound.     OV 10/25/2022  Subjective:  Patient ID: Danielle Fox, female , DOB: 1950-04-05 , age 69 y.o. , MRN: [id number] , ADDRESS: [address] [place] [address] PCP Bradd Canary, MD Patient Care Team: Bradd Canary, MD as PCP - General (Family Medicine) Kalman Shan, MD as Consulting Physician (Pulmonary Disease)  This Provider for this visit: Treatment Team:  Attending Provider: Kalman Shan, MD    10/25/2022 -   Chief Complaint  Patient presents with   Follow-up    PFT done today. Breathing is overall doing well and she denies any new co's.      HPI Danielle Fox 72 y.o. -presents for her 6-73-month follow-up.  Last seen in January 2024.  She continues to do stable.  Shortness of breath is stable.  She had pulmonary function test and it shows stability/improvement.  In fact it is normal.  She did have some crackles on her chest.  Her last CT scan of the chest was in July 2023.  She is a former heavy smoker to 9  years since she quit and therefore she needs another CT scan of the chest.  She is not interested in lung biopsy patient not interested in antifibrotic's at this point in time.  Next year will be 10th anniversary since she quit smoking and she is planning to go make a visit to [place] she is beginning to think about this trip.  In terms of her other issues she has adnexal mass.  In the past she did not want to do follow-up.  She states most recently she saw her primary care physician.  Review of the records indicate she saw Dr. Brock Ra 7 9/24.  She has agreed to have a pelvic ultrasound.  There is a left adnexal cyst that is now at 9.9 cm and it is grown.  She states primary care is addressing the issue.    SYMPTOM SCALE - ILD 03/16/2022  Current weight   O2 use ra  Shortness of Breath 0 -> 5 scale with 5 being worst (score 6 If unable to do)  At rest 0  Simple tasks - showers, clothes change, eating, shaving 00  Household (dishes, doing bed, laundry) 0  Shopping 0  Walking level at own pace 0  Walking up Stairs 02  Total (30-36) Dyspnea Score 2  How bad is your cough? 2  How bad is your fatigue 0  How bad is nausea 0  How bad is vomiting?  0  How bad is diarrhea? 00  How bad is anxiety? 0  How bad is depression 0  Any chronic pain - if so where and how bad 0       Simple office walk 185 feet x  3 laps goal with forehead probe 10/15/2021    O2 used ra   Number laps completed 3   Comments about pace avg   Resting Pulse Ox/HR 100% and 77/min   Final Pulse Ox/HR 97% and 98/min   Desaturated </= 88% no   Desaturated <= 3% points yes   Got Tachycardic >/= 90/min yes   Symptoms at end of test No complaints   Miscellaneous comments x    PFT.     Latest Ref Rng & Units 10/25/2022   10:08 AM 03/16/2022   11:45 AM 10/14/2021   12:39 PM  PFT Results  FVC-Pre L 2.12  P 2.17    FVC-Predicted Pre % 84  P 85  79   FVC-Post L   2.17   FVC-Predicted Post %   85   Pre FEV1/FVC %  % 67  P 65  61   Post FEV1/FCV % %   63   FEV1-Pre L 1.42  P 1.41  1.24   FEV1-Predicted Pre % 75  P 73  65   FEV1-Post L   1.37   DLCO uncorrected ml/min/mmHg 16.01  P 12.00  14.53   DLCO UNC% % 93  P 69  84   DLCO corrected ml/min/mmHg 15.68  P 11.79  14.53   DLCO COR %Predicted % 91  P 68  84   DLVA Predicted % 97  P 75  92   TLC L   4.76   TLC % Predicted %   105   RV % Predicted %   131     P Preliminary result       LAB RESULTS last 96 hours No results found.  LAB RESULTS last 90 days Recent Results (from the past 2160 hour(s))  CBC with Differential/Platelet     Status: Abnormal   Collection Time: 09/07/22 12:16 PM  Result Value Ref Range   WBC 7.2 4.0 - 10.5 K/uL   RBC 4.96 3.87 - 5.11 Mil/uL   Hemoglobin 14.1 12.0 - 15.0 g/dL   HCT 52.8 41.3 - 24.4 %   MCV 88.2 78.0 - 100.0 fl   MCHC 32.3 30.0 - 36.0 g/dL   RDW 01.0 27.2 - 53.6 %   Platelets 240.0 150.0 - 400.0 K/uL   Neutrophils Relative % 49.6 43.0 - 77.0 %   Lymphocytes Relative 23.2 12.0 - 46.0 %   Monocytes Relative 8.8 3.0 - 12.0 %   Eosinophils Relative 15.8 Repeated and verified X2. (H) 0.0 - 5.0 %   Basophils Relative 2.6 0.0 - 3.0 %   Neutro Abs 3.6 1.4 - 7.7 K/uL   Lymphs Abs 1.7 0.7 - 4.0 K/uL   Monocytes Absolute 0.6 0.1 - 1.0 K/uL   Eosinophils Absolute 1.1 (H) 0.0 - 0.7 K/uL   Basophils Absolute 0.2 (H) 0.0 - 0.1 K/uL  Comprehensive metabolic panel     Status: Abnormal   Collection Time:  09/07/22 12:16 PM  Result Value Ref Range   Sodium 140 135 - 145 mEq/L   Potassium 4.4 3.5 - 5.1 mEq/L   Chloride 102 96 - 112 mEq/L   CO2 30 19 - 32 mEq/L   Glucose, Bld 83 70 - 99 mg/dL   BUN 13 6 - 23 mg/dL   Creatinine, Ser 1.61 0.40 - 1.20 mg/dL   Total Bilirubin 0.6 0.2 - 1.2 mg/dL   Alkaline Phosphatase 98 39 - 117 U/L   AST 17 0 - 37 U/L   ALT 13 0 - 35 U/L   Total Protein 6.9 6.0 - 8.3 g/dL   Albumin 4.3 3.5 - 5.2 g/dL   GFR 09.60 (L) >45.40 mL/min    Comment: Calculated using the CKD-EPI  Creatinine Equation (2021)   Calcium 10.1 8.4 - 10.5 mg/dL  Lipid panel     Status: Abnormal   Collection Time: 09/07/22 12:16 PM  Result Value Ref Range   Cholesterol 268 (H) 0 - 200 mg/dL    Comment: ATP III Classification       Desirable:  < 200 mg/dL               Borderline High:  200 - 239 mg/dL          High:  > = 981 mg/dL   Triglycerides 191.4 0.0 - 149.0 mg/dL    Comment: Normal:  <782 mg/dLBorderline High:  150 - 199 mg/dL   HDL 95.62 >13.08 mg/dL   VLDL 65.7 0.0 - 84.6 mg/dL   LDL Cholesterol 962 (H) 0 - 99 mg/dL   Total CHOL/HDL Ratio 4     Comment:                Men          Women1/2 Average Risk     3.4          3.3Average Risk          5.0          4.42X Average Risk          9.6          7.13X Average Risk          15.0          11.0                       NonHDL 192.68     Comment: NOTE:  Non-HDL goal should be 30 mg/dL higher than patient's LDL goal (i.e. LDL goal of < 70 mg/dL, would have non-HDL goal of < 100 mg/dL)  TSH     Status: None   Collection Time: 09/07/22 12:16 PM  Result Value Ref Range   TSH 1.79 0.35 - 5.50 uIU/mL  VITAMIN D 25 Hydroxy (Vit-D Deficiency, Fractures)     Status: None   Collection Time: 09/07/22 12:16 PM  Result Value Ref Range   VITD 50.10 30.00 - 100.00 ng/mL  Pulmonary function test     Status: None (Preliminary result)   Collection Time: 10/25/22 10:08 AM  Result Value Ref Range   FVC-Pre 2.12 L   FVC-%Pred-Pre 84 %   FEV1-Pre 1.42 L   FEV1-%Pred-Pre 75 %   FEV6-Pre 2.10 L   FEV6-%Pred-Pre 87 %   Pre FEV1/FVC ratio 67 %   FEV1FVC-%Pred-Pre 88 %   Pre FEV6/FVC Ratio 99 %   FEV6FVC-%Pred-Pre 103 %   FEF 25-75 Pre 0.77 L/sec   FEF2575-%Pred-Pre  48 %   DLCO unc 16.01 ml/min/mmHg   DLCO unc % pred 93 %   DLCO cor 15.68 ml/min/mmHg   DLCO cor % pred 91 %   DL/VA 1.61 ml/min/mmHg/L   DL/VA % pred 97 %         has a past medical history of Allergy, Clostridioides difficile infection (10/19/2018), Colon polyps, Constipation  (10/06/2014), COPD (chronic obstructive pulmonary disease) (HCC), Osteopenia, Positive TB test, Preventative health care (06/29/2016), Pruritus (06/29/2016), Pyloric stenosis in adult (05/2019), and Welcome to Medicare preventive visit (06/29/2016).   reports that she quit smoking about 8 years ago. Her smoking use included cigarettes. She started smoking about 56 years ago. She has a 48 pack-year smoking history. She has never used smokeless tobacco.  Past Surgical History:  Procedure Laterality Date   ABDOMINAL HYSTERECTOMY     APPENDECTOMY     COLONOSCOPY     01-14-2005   DILATION AND CURETTAGE OF UTERUS     FINE NEEDLE ASPIRATION  02/27/2021   Procedure: FINE NEEDLE ASPIRATION (FNA) LINEAR;  Surgeon: Josephine Igo, DO;  Location: [facility];  Service: Pulmonary;;   SINUS IRRIGATION  04/02/2015   TUBAL LIGATION     UPPER GASTROINTESTINAL ENDOSCOPY  11/22/2018   VIDEO BRONCHOSCOPY WITH ENDOBRONCHIAL ULTRASOUND Bilateral 02/27/2021   Procedure: VIDEO BRONCHOSCOPY WITH ENDOBRONCHIAL ULTRASOUND;  Surgeon: Josephine Igo, DO;  Location: [facility];  Service: Pulmonary;  Laterality: Bilateral;    Allergies  Allergen Reactions   Doxycycline Nausea And Vomiting   Vancomycin     Nausea and abdominal pain   Erythromycin Base Nausea Only    Immunization History  Administered Date(s) Administered   Fluad Quad(high Dose 65+) 01/29/2019, 12/28/2019, 12/02/2020, 01/13/2022   Influenza Split 01/07/2011   Influenza Whole 11/29/2009   Influenza, High Dose Seasonal PF 11/07/2017   Influenza,inj,Quad PF,6+ Mos 12/14/2012   Influenza-Unspecified 12/28/2013, 12/01/2015, 12/25/2016, 12/30/2021   PFIZER(Purple Top)SARS-COV-2 Vaccination 05/06/2019, 06/06/2019, 12/15/2019   Pneumococcal Conjugate-13 06/29/2016   Respiratory Syncytial Virus Vaccine,Recomb Aduvanted(Arexvy) 03/10/2022   Tdap 03/01/2001, 06/29/2016   Zoster Recombinant(Shingrix) 03/07/2018    Family History  Problem Relation Age  of Onset   Heart disease Mother        cabg, MI   Alcohol abuse Father    Cirrhosis Father    Heart disease Father        MI   Colon cancer Neg Hx    Colon polyps Neg Hx    Esophageal cancer Neg Hx    Stomach cancer Neg Hx    Rectal cancer Neg Hx      Current Outpatient Medications:    budesonide-formoterol (SYMBICORT) 160-4.5 MCG/ACT inhaler, Inhale 2 puffs into the lungs 2 (two) times daily. in the morning and at bedtime., Disp: 10.2 g, Rfl: 5   esomeprazole (NEXIUM) 40 MG capsule, Take 1 capsule (40 mg total) by mouth daily before breakfast., Disp: 90 capsule, Rfl: 3   hydrOXYzine (ATARAX) 10 MG tablet, Take 1-2 tablets (10-20 mg total) by mouth 3 (three) times daily as needed., Disp: 30 tablet, Rfl: 1   Multiple Vitamins-Minerals (MULTIVITAMIN WITH MINERALS) tablet, Take 1 tablet by mouth daily., Disp: , Rfl:    Vitamin D, Ergocalciferol, (DRISDOL) 1.25 MG (50000 UNIT) CAPS capsule, Take 1 capsule (50,000 Units total) by mouth every 7 (seven) days., Disp: 12 capsule, Rfl: 0      Objective:   Vitals:   10/25/22 1037  BP: 98/64  Pulse: 90  Temp: 98 F (36.7 C)  TempSrc: Oral  SpO2: 97%  Weight: 117 lb 6.4 oz (53.3 kg)  Height: 5\' 1"  (1.549 m)    Estimated body mass index is 22.18 kg/m as calculated from the following:   Height as of this encounter: 5\' 1"  (1.549 m).   Weight as of this encounter: 117 lb 6.4 oz (53.3 kg).  @WEIGHTCHANGE @  [employer]   10/25/22 1037  Weight: 117 lb 6.4 oz (53.3 kg)     Physical Exam   General: No distress. Looks well O2 at rest: no Cane present: no Sitting in wheel chair: no Frail: no Obese: no Neuro: Alert and Oriented x 3. GCS 15. Speech normal Psych: Pleasant Resp:  Barrel Chest - no.  Wheeze - no, Crackles - possible crackles base, No overt respiratory distress CVS: Normal heart sounds. Murmurs - no Ext: Stigmata of Connective Tissue Disease - no HEENT: Normal upper airway. PEERL +. No post nasal drip         Assessment:       ICD-10-CM   1. Stopped smoking with greater than 40 pack year history  Z87.891     2. Mediastinal adenopathy  R59.0     3. ILD (interstitial lung disease) (HCC)  J84.9     4. Pulmonary emphysema, unspecified emphysema type (HCC)  J43.9          Plan:     Patient Instructions  Hilar adenopathy & Mediastinal adenopathy Abnormal PET scan of mediastinum Nov 2022. 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  -PET scan November 2023 still shows hyperactive lymph nodes but stable in size x 1 year -Stability with nondiagnostic bronchoscopy 1 year ago suggest reactive process versus sarcoid = Last CT scan of the chest July 2023  Plan  - Sharedd ecision making for continued monitoring approach  -Will hold off on referral to thoracic surgery for mediastinal -Do CT scan of the chest without contrast [preferably high-resolution] in the next few weeks  ILD (interstitial lung disease) (HCC) -absent 2019 and seen first in 2021 and then February 2023 -> stable end of 2023/early 2024  -Minimal to no symptoms as of August 2024 visit -Continued lung function stability/improvement as of August 2024 compared August 2023 -Lung function normal -Some crackles are suggesting some amount of ILD still present but stable -Last CT scan of the chest July 2023.  Plan -Do high-resolution CT chest supine and prone in the next 4 to 8 weeks  - repeat spiro/dlco In 8 months  --No antifibrotic's or biopsy at this visit [per shared decision making]   Pulmonary emphysema  PFT normal  Plan  - continue symbicort scheduled with albuterol as needed - next visit can look at reducing to sporivan   Followup Do CT scan of the chest in the next few weeks and then we will call with the results 8 months 15-minute visit but after spirometry and DLCO   FOLLOWUP Return in about 8 months (around 06/25/2023) for 15 min visit, after  Cleda Daub and DLCO, ILD, with Dr Marchelle Gearing, Face to Face Visit.    SIGNATURE    Dr. Kalman Shan, M.D., F.C.C.P,  Pulmonary and Critical Care Medicine Staff Physician, [facility] Center Director - Interstitial Lung Disease  Program  Pulmonary Fibrosis [facility] [place], [place], [address]  Pager: [phone], If no answer or between  15:00h - 7:00h: call [phone] Telephone: [phone]  11:29 AM 10/25/2022

## 2022-10-25 NOTE — Progress Notes (Signed)
Spiro/DLCO performed today. 

## 2022-10-25 NOTE — Patient Instructions (Signed)
Spiro/DLCO performed today. 

## 2022-10-31 ENCOUNTER — Other Ambulatory Visit: Payer: Self-pay | Admitting: Family Medicine

## 2022-11-14 ENCOUNTER — Ambulatory Visit
Admission: EM | Admit: 2022-11-14 | Discharge: 2022-11-14 | Disposition: A | Discharge status: Home / Self Care | Payer: Medicare Other | Attending: Internal Medicine | Admitting: Internal Medicine

## 2022-11-14 DIAGNOSIS — J988 Other specified respiratory disorders: Secondary | ICD-10-CM

## 2022-11-14 DIAGNOSIS — B9789 Other viral agents as the cause of diseases classified elsewhere: Secondary | ICD-10-CM | POA: Diagnosis not present

## 2022-11-14 DIAGNOSIS — J449 Chronic obstructive pulmonary disease, unspecified: Secondary | ICD-10-CM | POA: Diagnosis not present

## 2022-11-14 MED ORDER — PREDNISONE 20 MG PO TABS: ORAL_TABLET | ORAL | 0 refills | Status: DC

## 2022-11-14 MED ORDER — PROMETHAZINE-DM 6.25-15 MG/5ML PO SYRP: 2.5000 mL | ORAL_SOLUTION | Freq: Three times a day (TID) | ORAL | 0 refills | Status: DC | PRN

## 2022-11-14 MED ORDER — CETIRIZINE HCL 10 MG PO TABS: 10.0000 mg | ORAL_TABLET | Freq: Every day | ORAL | 0 refills | Status: AC

## 2022-11-14 NOTE — ED Provider Notes (Signed)
[facility]  Note:  This document was prepared using Conservation officer, historic buildings and may include unintentional dictation errors.  MRN: [id number] DOB: 04-14-1950  Subjective:   Danielle Fox is a 72 y.o. female presenting for 2-day history of acute onset persistent throat pain, painful swallowing, sinus congestion, runny nose, ear fullness and ear pain, productive cough.  No chest pain, shortness of breath or wheezing.  Has a history of COPD, does not use an albuterol inhaler.  She uses Symbicort as needed but not frequently.  Does not want a COVID test.  No current facility-administered medications for this encounter.  Current Outpatient Medications:    budesonide-formoterol (SYMBICORT) 160-4.5 MCG/ACT inhaler, INHALE 2 PUFFS INTO THE LUNGS TWICE DAILY IN THE MORNING AND AT BEDTIME, Disp: 10.2 g, Rfl: 5   esomeprazole (NEXIUM) 40 MG capsule, Take 1 capsule (40 mg total) by mouth daily before breakfast., Disp: 90 capsule, Rfl: 3   hydrOXYzine (ATARAX) 10 MG tablet, Take 1-2 tablets (10-20 mg total) by mouth 3 (three) times daily as needed., Disp: 30 tablet, Rfl: 1   Multiple Vitamins-Minerals (MULTIVITAMIN WITH MINERALS) tablet, Take 1 tablet by mouth daily., Disp: , Rfl:    Vitamin D, Ergocalciferol, (DRISDOL) 1.25 MG (50000 UNIT) CAPS capsule, Take 1 capsule (50,000 Units total) by mouth every 7 (seven) days., Disp: 12 capsule, Rfl: 0   Allergies  Allergen Reactions   Doxycycline Nausea And Vomiting   Vancomycin     Nausea and abdominal pain   Erythromycin Base Nausea Only    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   Preventative health care 06/29/2016   Pruritus 06/29/2016   Pyloric stenosis in adult 05/2019   Welcome  to Medicare preventive visit 06/29/2016     Past Surgical History:  Procedure Laterality Date   ABDOMINAL HYSTERECTOMY     APPENDECTOMY     COLONOSCOPY     01-14-2005   DILATION AND CURETTAGE OF UTERUS     FINE NEEDLE ASPIRATION  02/27/2021   Procedure: FINE NEEDLE ASPIRATION (FNA) LINEAR;  Surgeon: Josephine Igo, DO;  Location: [facility];  Service: Pulmonary;;   SINUS IRRIGATION  04/02/2015   TUBAL LIGATION     UPPER GASTROINTESTINAL ENDOSCOPY  11/22/2018   VIDEO BRONCHOSCOPY WITH ENDOBRONCHIAL ULTRASOUND Bilateral 02/27/2021   Procedure: VIDEO BRONCHOSCOPY WITH ENDOBRONCHIAL ULTRASOUND;  Surgeon: Josephine Igo, DO;  Location: [facility];  Service: Pulmonary;  Laterality: Bilateral;    Family History  Problem Relation Age of Onset   Heart disease Mother        cabg, MI   Alcohol abuse Father    Cirrhosis Father    Heart disease Father        MI   Colon cancer Neg Hx    Colon polyps Neg Hx    Esophageal cancer Neg Hx    Stomach cancer Neg Hx    Rectal cancer Neg Hx     Social History   Tobacco Use   Smoking status: Former    Current packs/day: 0.00    Average packs/day: 1 pack/day for 48.0 years (48.0 ttl pk-yrs)    Types: Cigarettes    Start date: 02/19/1966    Quit  date: 02/19/2014    Years since quitting: 8.7   Smokeless tobacco: Never   Tobacco comments:    Quit and has no desire to smoke again,  Vaping Use   Vaping status: Never Used  Substance Use Topics   Alcohol use: Yes    Alcohol/week: 0.0 standard drinks of alcohol    Comment: socially   Drug use: No    ROS   Objective:   Vitals: BP 107/72 (BP Location: Right Arm)   Pulse 90   Temp 98.5 F (36.9 C) (Oral)   Resp 17   SpO2 94%   Physical Exam Constitutional:      General: She is not in acute distress.    Appearance: Normal appearance. She is well-developed and normal weight. She is not ill-appearing, toxic-appearing or diaphoretic.  HENT:     Head: Normocephalic and  atraumatic.     Right Ear: Tympanic membrane, ear canal and external ear normal. No drainage or tenderness. No middle ear effusion. There is no impacted cerumen. Tympanic membrane is not erythematous or bulging.     Left Ear: Tympanic membrane, ear canal and external ear normal. No drainage or tenderness.  No middle ear effusion. There is no impacted cerumen. Tympanic membrane is not erythematous or bulging.     Nose: Congestion and rhinorrhea present.     Mouth/Throat:     Mouth: Mucous membranes are moist. No oral lesions.     Pharynx: Posterior oropharyngeal erythema (with associated significant post-nasal drainage) present. No pharyngeal swelling, oropharyngeal exudate or uvula swelling.     Tonsils: No tonsillar exudate or tonsillar abscesses.  Eyes:     General: No scleral icterus.       Right eye: No discharge.        Left eye: No discharge.     Extraocular Movements: Extraocular movements intact.     Right eye: Normal extraocular motion.     Left eye: Normal extraocular motion.     Conjunctiva/sclera: Conjunctivae normal.  Cardiovascular:     Rate and Rhythm: Normal rate and regular rhythm.     Heart sounds: Normal heart sounds. No murmur heard.    No friction rub. No gallop.  Pulmonary:     Effort: Pulmonary effort is normal. No respiratory distress.     Breath sounds: No stridor. No wheezing, rhonchi or rales.  Chest:     Chest wall: No tenderness.  Musculoskeletal:     Cervical back: Normal range of motion and neck supple.  Lymphadenopathy:     Cervical: No cervical adenopathy.  Skin:    General: Skin is warm and dry.  Neurological:     General: No focal deficit present.     Mental Status: She is alert and oriented to person, place, and time.  Psychiatric:        Mood and Affect: Mood normal.        Behavior: Behavior normal.     Assessment and Plan :   PDMP not reviewed this encounter.  1. Viral respiratory infection   2. Chronic obstructive pulmonary disease,  unspecified COPD type (HCC)    Patient politely declined COVID testing.  Deferred imaging given clear cardiopulmonary exam, hemodynamically stable vital signs.  She has previously done very well with steroids and therefore I recommended using this in the context of her COPD and upper respiratory symptoms.  Recommend supportive care otherwise for an acute viral respiratory infection.  Counseled patient on potential for adverse effects with medications prescribed/recommended today, ER  and return-to-clinic precautions discussed, patient verbalized understanding.    Wallis Bamberg, [place] 11/14/22 [phone]

## 2022-11-14 NOTE — ED Triage Notes (Addendum)
Pt presents with c/o a sore throat X 2 days. Pt states she has taken allergy pills. Has not worked. States her tonsils are swollen, her ears hurt and has had a productive cough.

## 2022-11-14 NOTE — Discharge Instructions (Signed)
We will manage this as a viral respiratory infection. For sore throat or cough try using a honey-based tea. Use 3 teaspoons of honey with juice squeezed from half lemon. Place shaved pieces of ginger into 1/2-1 cup of water and warm over stove top. Then mix the ingredients and repeat every 4 hours as needed. Please take Tylenol 650mg  every 6 hours for aches and pains, fevers. Hydrate very well with at least 2 liters of water. Eat light meals such as soups to replenish electrolytes and soft fruits, veggies. Start an antihistamine like Zyrtec (10mg  daily) for postnasal drainage, sinus congestion.  You can take this together with prednisone.  Use the cough medications as needed.

## 2022-11-26 ENCOUNTER — Encounter: Payer: Self-pay | Admitting: Emergency Medicine

## 2022-11-26 ENCOUNTER — Ambulatory Visit (INDEPENDENT_AMBULATORY_CARE_PROVIDER_SITE_OTHER): Payer: Medicare Other

## 2022-11-26 ENCOUNTER — Ambulatory Visit
Admission: EM | Admit: 2022-11-26 | Discharge: 2022-11-26 | Disposition: A | Discharge status: Home / Self Care | Payer: Medicare Other | Attending: Internal Medicine | Admitting: Internal Medicine

## 2022-11-26 DIAGNOSIS — J449 Chronic obstructive pulmonary disease, unspecified: Secondary | ICD-10-CM | POA: Diagnosis not present

## 2022-11-26 DIAGNOSIS — R059 Cough, unspecified: Secondary | ICD-10-CM

## 2022-11-26 DIAGNOSIS — R918 Other nonspecific abnormal finding of lung field: Secondary | ICD-10-CM | POA: Diagnosis not present

## 2022-11-26 DIAGNOSIS — R053 Chronic cough: Secondary | ICD-10-CM | POA: Diagnosis not present

## 2022-11-26 MED ORDER — METHYLPREDNISOLONE ACETATE 40 MG/ML IJ SUSP
40.0000 mg | Freq: Once | INTRAMUSCULAR | Status: AC
  Administered 2022-11-26: 40 mg via INTRAMUSCULAR

## 2022-11-26 MED ORDER — PROMETHAZINE-DM 6.25-15 MG/5ML PO SYRP: 2.5000 mL | ORAL_SOLUTION | Freq: Three times a day (TID) | ORAL | 0 refills | Status: DC | PRN

## 2022-11-26 NOTE — ED Provider Notes (Addendum)
[facility]  Note:  This document was prepared using Conservation officer, historic buildings and may include unintentional dictation errors.  MRN: [id number] DOB: January 13, 1951  Subjective:   Danielle Fox is a 72 y.o. female presenting for 3-week history of persistent coughing.  Has a hacking dry cough, interrupts her sleep.  Patient was last seen 11/14/2022.  She underwent a course of prednisone and supportive care for a viral respiratory infection with underlying COPD.  She did have some improvement with her throat and sinus symptoms.  However, the cough is lingering.  She does have a history of chronic pansinusitis, chronic cough, shortness of breath as well as her COPD.  No current facility-administered medications for this encounter.  Current Outpatient Medications:    budesonide-formoterol (SYMBICORT) 160-4.5 MCG/ACT inhaler, INHALE 2 PUFFS INTO THE LUNGS TWICE DAILY IN THE MORNING AND AT BEDTIME, Disp: 10.2 g, Rfl: 5   cetirizine (ZYRTEC ALLERGY) 10 MG tablet, Take 1 tablet (10 mg total) by mouth daily., Disp: 30 tablet, Rfl: 0   esomeprazole (NEXIUM) 40 MG capsule, Take 1 capsule (40 mg total) by mouth daily before breakfast., Disp: 90 capsule, Rfl: 3   hydrOXYzine (ATARAX) 10 MG tablet, Take 1-2 tablets (10-20 mg total) by mouth 3 (three) times daily as needed., Disp: 30 tablet, Rfl: 1   Multiple Vitamins-Minerals (MULTIVITAMIN WITH MINERALS) tablet, Take 1 tablet by mouth daily., Disp: , Rfl:    predniSONE (DELTASONE) 20 MG tablet, Take 2 tablets daily with breakfast., Disp: 10 tablet, Rfl: 0   promethazine-dextromethorphan (PROMETHAZINE-DM) 6.25-15 MG/5ML syrup, Take 2.5 mLs by mouth 3 (three) times daily as needed for cough., Disp: 100 mL, Rfl: 0   Vitamin D, Ergocalciferol, (DRISDOL) 1.25 MG (50000 UNIT) CAPS capsule, Take 1 capsule (50,000 Units total) by mouth every 7 (seven) days., Disp: 12 capsule, Rfl: 0   Allergies  Allergen Reactions   Doxycycline  Nausea And Vomiting   Vancomycin     Nausea and abdominal pain   Erythromycin Base Nausea Only    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   Preventative health care 06/29/2016   Pruritus 06/29/2016   Pyloric stenosis in adult 05/2019   Welcome to Medicare preventive visit 06/29/2016     Past Surgical History:  Procedure Laterality Date   ABDOMINAL HYSTERECTOMY     APPENDECTOMY     COLONOSCOPY     01-14-2005   DILATION AND CURETTAGE OF UTERUS     FINE NEEDLE ASPIRATION  02/27/2021   Procedure: FINE NEEDLE ASPIRATION (FNA) LINEAR;  Surgeon: Josephine Igo, DO;  Location: [facility];  Service: Pulmonary;;   SINUS IRRIGATION  04/02/2015   TUBAL LIGATION     UPPER GASTROINTESTINAL ENDOSCOPY  11/22/2018   VIDEO BRONCHOSCOPY WITH ENDOBRONCHIAL ULTRASOUND Bilateral 02/27/2021   Procedure: VIDEO BRONCHOSCOPY WITH ENDOBRONCHIAL ULTRASOUND;  Surgeon: Josephine Igo, DO;  Location: [facility];  Service: Pulmonary;  Laterality: Bilateral;    Family History  Problem Relation Age of Onset   Heart disease Mother        cabg, MI   Alcohol abuse Father    Cirrhosis Father    Heart disease Father        MI  Colon cancer Neg Hx    Colon polyps Neg Hx    Esophageal cancer Neg Hx    Stomach cancer Neg Hx    Rectal cancer Neg Hx     Social History   Tobacco Use   Smoking status: Former    Current packs/day: 0.00    Average packs/day: 1 pack/day for 48.0 years (48.0 ttl pk-yrs)    Types: Cigarettes    Start date: 02/19/1966    Quit date: 02/19/2014    Years since quitting: 8.7   Smokeless tobacco: Never   Tobacco comments:    Quit and has no desire to smoke again,  Vaping Use   Vaping status: Never Used  Substance Use Topics   Alcohol use: Yes     Alcohol/week: 0.0 standard drinks of alcohol    Comment: socially   Drug use: No    ROS   Objective:   Vitals: BP 115/63 (BP Location: Right Arm)   Pulse 98   Temp 98.3 F (36.8 C) (Oral)   Resp 16   SpO2 94%   Physical Exam Constitutional:      General: She is not in acute distress.    Appearance: Normal appearance. She is well-developed. She is not ill-appearing, toxic-appearing or diaphoretic.  HENT:     Head: Normocephalic and atraumatic.     Nose: Nose normal.     Mouth/Throat:     Mouth: Mucous membranes are moist.  Eyes:     General: No scleral icterus.       Right eye: No discharge.        Left eye: No discharge.     Extraocular Movements: Extraocular movements intact.  Cardiovascular:     Rate and Rhythm: Normal rate and regular rhythm.     Heart sounds: Normal heart sounds. No murmur heard.    No friction rub. No gallop.  Pulmonary:     Effort: Pulmonary effort is normal. No respiratory distress.     Breath sounds: No stridor. Rhonchi (mild throughout) present. No wheezing or rales.  Chest:     Chest wall: No tenderness.  Skin:    General: Skin is warm and dry.  Neurological:     General: No focal deficit present.     Mental Status: She is alert and oriented to person, place, and time.  Psychiatric:        Mood and Affect: Mood normal.        Behavior: Behavior normal.    IM Depo-Medrol 40 mg administered in clinic.  Assessment and Plan :   PDMP not reviewed this encounter.  1. Persistent cough for 3 weeks or longer   2. Chronic obstructive pulmonary disease, unspecified COPD type (HCC)    Patient is to maintain her breathing treatments, Symbicort.  We will be using IM steroids as above.  X-ray over-read was pending at time of discharge, recommended follow up with only abnormal results. Otherwise will not call for negative over-read. Patient was in agreement.  Will finalize diagnosis and treatment plan following the radiology over read.   Continue with supportive care otherwise.  Counseled patient on potential for adverse effects with medications prescribed/recommended today, ER and return-to-clinic precautions discussed, patient verbalized understanding.    Wallis Bamberg, PA-C 11/26/22 1435   UPDATE: DG Chest 2 View  Result Date: 11/26/2022 CLINICAL DATA:  Persistent cough EXAM: CHEST - 2 VIEW COMPARISON:  12/26/2020, CT chest, 09/07/2021 FINDINGS: The heart size and mediastinal contours are within normal limits. Pulmonary hyperinflation.  Diffuse bilateral interstitial pulmonary opacity. Disc degenerative disease of the thoracic spine. 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 No new or focal airspace opacity. Electronically Signed   By: Jearld Lesch M.D.   On: 11/26/2022 16:11    No new focal airspace or pneumonia identified.  Recommended maintaining the management as we discussed in clinic.  Will defer antibiotic use, discussed antibiotic stewardship with the patient and she was agreeable.  Follow-up with her pulmonologist as soon as possible.   Wallis Bamberg, [place] 11/26/22 1627

## 2022-11-26 NOTE — ED Triage Notes (Addendum)
Here on 9/15 w sore throat and cough, steroids helped the sore throat go away. The cough has now gone on for three weeks. States she does have a chronic lung problem of "something growing in here." States she has to have yearly CT scans of lungs, also states she's had a positive TB test in the past. States she had a low grade fever at home this morning and that's what made her decide to come in.

## 2022-11-26 NOTE — Discharge Instructions (Addendum)
I will call you with your x-ray results. For now we have used a steroid injection to help with your persistent and chronic cough, underlying COPD.

## 2022-11-29 ENCOUNTER — Telehealth: Payer: Self-pay | Admitting: Internal Medicine

## 2022-11-29 NOTE — Telephone Encounter (Signed)
PT called in bc she has had a horrible cough for the past week and she can't seen to get rid of it.

## 2022-11-30 ENCOUNTER — Ambulatory Visit: Payer: Medicare Other | Admitting: Primary Care

## 2022-11-30 ENCOUNTER — Encounter: Payer: Self-pay | Admitting: Primary Care

## 2022-11-30 VITALS — BP 130/72 | HR 82 | Ht 61.0 in | Wt 116.0 lb

## 2022-11-30 DIAGNOSIS — R058 Other specified cough: Secondary | ICD-10-CM | POA: Diagnosis not present

## 2022-11-30 MED ORDER — PROMETHAZINE-DM 6.25-15 MG/5ML PO SYRP
5.0000 mL | ORAL_SOLUTION | Freq: Four times a day (QID) | ORAL | 0 refills | Status: DC | PRN
Start: 2022-11-30 — End: 2023-07-29

## 2022-11-30 MED ORDER — PREDNISONE 10 MG PO TABS: ORAL_TABLET | ORAL | 0 refills | Status: DC

## 2022-11-30 NOTE — Progress Notes (Signed)
 @Patient  ID: Danielle Fox, female    DOB: 12-13-1950, 72 y.o.   MRN: [id number]  Chief Complaint  Patient presents with   Cough    Ongoing for a month.  Sore throat.  Has had prednisone.  Not taking zyrtec and has only been using Symbicort now and then.    Referring provider: Bradd Canary, MD  HPI: 72 year old female, former smoker quit in 2015 (48 pack year hx). PMH significant for mediastinal/hilar adenopathy, post viral cough syndrome, chronic parasinusitis, ischemic colitis, hyperlipidemia, anemia, vit D deficiency.   11/30/2022 Presents for acute office visit due to cough.  She was seen in the emergency room twice in September for URI and cough. Most recently seen on 11/26/2022 with reports of 3-week history of persistent cough.  Cough is dry.  She was treated with a course of prednisone on 11/14/2022. 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  No new or focal airspace opacity. Antibiotic was deferred and patient was advised to follow with pulmonary. She is maintained on Symbicort, Zyrtec and Nexium.  She has progressively gotten better since she had depo shot on 9/27, today is the best she has felt. Cough is mostly gone. She is is going to [place] on Friday and does not want spend the whole vacation coughing. She is scheduled for HRCT tomorrow to follow-up on ILD findings.     SYMPTOM SCALE - ILD 03/16/2022 11/30/2022   Current weight     O2 use ra RA  Shortness of Breath 0 -> 5 scale with 5 being worst (score 6 If unable to do)   At rest 0 0  Simple tasks - showers, clothes change, eating, shaving 00 0  Household (dishes, doing bed, laundry) 0 0  Shopping 0 0  Walking level at own pace 0 0  Walking up Stairs 02 1  Total (30-36) Dyspnea Score 2 1  How bad is your cough? 2 2.5 (sept- 5)  How bad is your fatigue 0 0  How bad is  nausea 0 0  How bad is vomiting?   0 0  How bad is diarrhea? 00 0  How bad is anxiety? 0 0  How bad is depression 0 0  Any chronic pain - if so where and how bad 0 0     Allergies  Allergen Reactions   Doxycycline Nausea And Vomiting   Vancomycin     Nausea and abdominal pain   Erythromycin Base Nausea Only and Nausea And Vomiting    Other reaction(s): GI Upset (intolerance)    Immunization History  Administered Date(s) Administered   Fluad Quad(high Dose 65+) 01/29/2019, 12/28/2019, 12/02/2020, 01/13/2022   Influenza Split 01/07/2011   Influenza Whole 11/29/2009   Influenza, High Dose Seasonal PF 11/07/2017   Influenza,inj,Quad PF,6+ Mos 12/14/2012   Influenza-Unspecified 12/28/2013, 12/01/2015, 12/25/2016, 12/30/2021   PFIZER(Purple Top)SARS-COV-2 Vaccination 05/06/2019, 06/06/2019, 12/15/2019   Pneumococcal Conjugate-13 06/29/2016   Respiratory Syncytial Virus Vaccine,Recomb Aduvanted(Arexvy) 03/10/2022   Tdap 03/01/2001, 06/29/2016   Zoster Recombinant(Shingrix) 03/07/2018    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   Preventative health care 06/29/2016  Pruritus 06/29/2016   Pyloric stenosis in adult 05/2019   Welcome to Medicare preventive visit 06/29/2016    Tobacco History: Social History   Tobacco Use  Smoking Status Former   Current packs/day: 0.00   Average packs/day: 1 pack/day for 48.0 years (48.0 ttl pk-yrs)   Types: Cigarettes   Start date: 02/19/1966   Quit date: 02/19/2014   Years since quitting: 8.7  Smokeless Tobacco Never  Tobacco Comments   Quit and has no desire to smoke again,   Counseling given: Not Answered Tobacco comments: Quit and has no desire to smoke again,   Outpatient Medications Prior to Visit   Medication Sig Dispense Refill   budesonide-formoterol (SYMBICORT) 160-4.5 MCG/ACT inhaler INHALE 2 PUFFS INTO THE LUNGS TWICE DAILY IN THE MORNING AND AT BEDTIME 10.2 g 5   Dextromethorphan HBr (DELSYM PO) Take by mouth as directed.     esomeprazole (NEXIUM) 40 MG capsule Take 1 capsule (40 mg total) by mouth daily before breakfast. 90 capsule 3   cetirizine (ZYRTEC ALLERGY) 10 MG tablet Take 1 tablet (10 mg total) by mouth daily. (Patient not taking: Reported on 11/30/2022) 30 tablet 0   Multiple Vitamins-Minerals (MULTIVITAMIN WITH MINERALS) tablet Take 1 tablet by mouth daily. (Patient not taking: Reported on 11/30/2022)     Vitamin D, Ergocalciferol, (DRISDOL) 1.25 MG (50000 UNIT) CAPS capsule Take 1 capsule (50,000 Units total) by mouth every 7 (seven) days. (Patient not taking: Reported on 11/30/2022) 12 capsule 0   hydrOXYzine (ATARAX) 10 MG tablet Take 1-2 tablets (10-20 mg total) by mouth 3 (three) times daily as needed. 30 tablet 1   predniSONE (DELTASONE) 20 MG tablet Take 2 tablets daily with breakfast. 10 tablet 0   promethazine-dextromethorphan (PROMETHAZINE-DM) 6.25-15 MG/5ML syrup Take 2.5 mLs by mouth 3 (three) times daily as needed for cough. 100 mL 0   No facility-administered medications prior to visit.    Review of Systems  Review of Systems  Constitutional: Negative.   HENT: Negative.    Respiratory:  Positive for cough. Negative for chest tightness, shortness of breath and wheezing.    Physical Exam  BP 130/72   Pulse 82   Ht 5\' 1"  (1.549 m)   Wt 116 lb (52.6 kg)   SpO2 98%   BMI 21.92 kg/m  Physical Exam Constitutional:      Appearance: Normal appearance.  HENT:     Head: Normocephalic and atraumatic.     Mouth/Throat:     Mouth: Mucous membranes are moist.     Pharynx: Oropharynx is clear.  Cardiovascular:     Rate and Rhythm: Normal rate and regular rhythm.  Pulmonary:     Effort: Pulmonary effort is normal.     Breath sounds: Normal breath sounds.   Skin:    General: Skin is warm and dry.  Neurological:     General: No focal deficit present.     Mental Status: She is alert and oriented to person, place, and time. Mental status is at baseline.  Psychiatric:        Mood and Affect: Mood normal.        Behavior: Behavior normal.        Thought Content: Thought content normal.        Judgment: Judgment normal.      Lab Results:  CBC    Component Value Date/Time   WBC 7.2 09/07/2022 1216   RBC 4.96 09/07/2022 1216   HGB 14.1 09/07/2022 1216   HCT 43.7 09/07/2022  1216   PLT 240.0 09/07/2022 1216   MCV 88.2 09/07/2022 1216   MCH 29.1 02/27/2021 0553   MCHC 32.3 09/07/2022 1216   RDW 15.1 09/07/2022 1216   LYMPHSABS 1.7 09/07/2022 1216   MONOABS 0.6 09/07/2022 1216   EOSABS 1.1 (H) 09/07/2022 1216   BASOSABS 0.2 (H) 09/07/2022 1216    BMET    Component Value Date/Time   NA 140 09/07/2022 1216   K 4.4 09/07/2022 1216   CL 102 09/07/2022 1216   CO2 30 09/07/2022 1216   GLUCOSE 83 09/07/2022 1216   BUN 13 09/07/2022 1216   CREATININE 0.95 09/07/2022 1216   CREATININE 0.74 11/15/2019 1522   CALCIUM 10.1 09/07/2022 1216   GFRNONAA >60 10/12/2019 0508   GFRAA >60 10/12/2019 0508    BNP No results found for: "BNP"  ProBNP No results found for: "PROBNP"  Imaging: DG Chest 2 View  Result Date: 11/26/2022 CLINICAL DATA:  Persistent cough EXAM: CHEST - 2 VIEW COMPARISON:  12/26/2020, CT chest, 09/07/2021 FINDINGS: The heart size and mediastinal contours are within normal limits. Pulmonary hyperinflation. Diffuse bilateral interstitial pulmonary opacity. Disc degenerative disease of the thoracic spine. 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 No new or focal airspace opacity. Electronically Signed   By: Jearld Lesch M.D.   On: 11/26/2022 16:11     Assessment & Plan:   Post-viral cough  syndrome - Patient has had a persistent cough since virual URI in September 2024. She was seen in ED twice, most recently on on 11/26/22. Cough has progressively improved since receiving Depomedrol shot 4 days ago. Sending in prednisone taper and promethazine-dm 5ml Q6 hours to have on hand while traveling to [place] next week. She is scheduled for HRCT tomorrow for ILD follow-up.    Glenford Bayley, NP 11/30/2022

## 2022-11-30 NOTE — Assessment & Plan Note (Signed)
-   Patient has had a persistent cough since virual URI in September. She was seen in ED twice, most recently on on 11/26/22. Cough has progressively improved since receiving Depomedrol shot 4 days ago. Sending in prednisone taper and promethazine-dm 5ml Q6 hours to have on hand while traveling to [place] next week. She is scheduled for HRCT tomorrow for ILD follow-up.

## 2022-11-30 NOTE — Patient Instructions (Addendum)
Recommendations: Take Symbicort two puffs every 12 hours Fill prednisone and promethazine-dm cough suppressant and take if needed while on vacation  We will call once we get results from your HRCT tomorrow (this could be 1-2 weeks, will be posted to your mychart)  Follow-up April with Dr. Debroah Baller (after spirometry)

## 2022-12-01 ENCOUNTER — Ambulatory Visit
Admission: RE | Admit: 2022-12-01 | Discharge: 2022-12-01 | Disposition: A | Discharge status: Home / Self Care | Payer: Medicare Other | Source: Ambulatory Visit | Attending: Internal Medicine | Admitting: Internal Medicine

## 2022-12-01 DIAGNOSIS — J432 Centrilobular emphysema: Secondary | ICD-10-CM | POA: Diagnosis not present

## 2022-12-01 DIAGNOSIS — R590 Localized enlarged lymph nodes: Secondary | ICD-10-CM | POA: Diagnosis not present

## 2022-12-01 DIAGNOSIS — J849 Interstitial pulmonary disease, unspecified: Secondary | ICD-10-CM

## 2022-12-01 DIAGNOSIS — R918 Other nonspecific abnormal finding of lung field: Secondary | ICD-10-CM | POA: Diagnosis not present

## 2022-12-01 NOTE — Telephone Encounter (Signed)
Pt seen in clinic by [facility] for acute visit on 11/30/22  Will close encounter

## 2022-12-03 ENCOUNTER — Encounter: Payer: Self-pay | Admitting: Obstetrics and Gynecology

## 2022-12-03 ENCOUNTER — Ambulatory Visit: Payer: Medicare Other | Admitting: Obstetrics and Gynecology

## 2022-12-03 VITALS — BP 96/60 | HR 68 | Resp 16 | Ht 61.0 in | Wt 117.0 lb

## 2022-12-03 DIAGNOSIS — N9489 Other specified conditions associated with female genital organs and menstrual cycle: Secondary | ICD-10-CM | POA: Diagnosis not present

## 2022-12-03 NOTE — Progress Notes (Signed)
NEW GYNECOLOGY PATIENT Patient name: Danielle Fox MRN [id number]  Date of birth: 1951/02/06 Chief Complaint:   Mass     History:  Danielle Fox is a 72 y.o. G3P3003 being seen today for ovarian cyst.  No pelvic pain or pressure. No abnormal bloating, early satiety, changes in stool caliber, vaginal discharge, vaginal bleeding, or unintentional weight loss. Would prefer to not have surgery unless she has to. She is wondering if cyst/mass is pushing on her bladder. Has been having more issues with urinary loss and frequency - notes that she has been coughing a lot more recently and seeing a pulmonologist to address it.      Gynecologic History No LMP recorded. Patient has had a hysterectomy. Contraception: status post hysterectomy Last Pap: n/a Last Mammogram: 04/2022.  Result was BIRADS 1 Last Colonoscopy: 10/2018.   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   Preventative health care 06/29/2016   Pruritus 06/29/2016   Pyloric stenosis in adult 05/2019   Welcome to Medicare preventive visit 06/29/2016    Past Surgical History:  Procedure Laterality Date   ABDOMINAL HYSTERECTOMY     APPENDECTOMY     COLONOSCOPY     01-14-2005   DILATION AND CURETTAGE OF UTERUS     FINE NEEDLE ASPIRATION  02/27/2021   Procedure: FINE NEEDLE ASPIRATION (FNA) LINEAR;  Surgeon: Josephine Igo, DO;  Location: [facility];  Service: Pulmonary;;   SINUS IRRIGATION  04/02/2015   TUBAL  LIGATION     UPPER GASTROINTESTINAL ENDOSCOPY  11/22/2018   VIDEO BRONCHOSCOPY WITH ENDOBRONCHIAL ULTRASOUND Bilateral 02/27/2021   Procedure: VIDEO BRONCHOSCOPY WITH ENDOBRONCHIAL ULTRASOUND;  Surgeon: Josephine Igo, DO;  Location: [facility];  Service: Pulmonary;  Laterality: Bilateral;    Current Outpatient Medications on File Prior to Visit  Medication Sig Dispense Refill   budesonide-formoterol (SYMBICORT) 160-4.5 MCG/ACT inhaler INHALE 2 PUFFS INTO THE LUNGS TWICE DAILY IN THE MORNING AND AT BEDTIME 10.2 g 5   cetirizine (ZYRTEC ALLERGY) 10 MG tablet Take 1 tablet (10 mg total) by mouth daily. 30 tablet 0   Dextromethorphan HBr (DELSYM PO) Take by mouth as directed.     esomeprazole (NEXIUM) 40 MG capsule Take 1 capsule (40 mg total) by mouth daily before breakfast. 90 capsule 3   Multiple Vitamins-Minerals (MULTIVITAMIN WITH MINERALS) tablet Take 1 tablet by mouth daily.     promethazine-dextromethorphan (PROMETHAZINE-DM) 6.25-15 MG/5ML syrup Take 5 mLs by mouth 4 (four) times daily as needed. 240 mL 0   No current facility-administered medications on file prior to visit.    Allergies  Allergen Reactions   Doxycycline Nausea And Vomiting   Vancomycin     Nausea  and abdominal pain   Erythromycin Base Nausea Only and Nausea And Vomiting    Other reaction(s): GI Upset (intolerance)    Social History:  reports that she quit smoking about 8 years ago. Her smoking use included cigarettes. She started smoking about 56 years ago. She has a 48 pack-year smoking history. She has never used smokeless tobacco. She reports current alcohol use. She reports that she does not use drugs.  Family History  Problem Relation Age of Onset   Heart disease Mother        cabg, MI   Alcohol abuse Father    Cirrhosis Father    Heart disease Father        MI   Colon cancer Neg Hx    Colon polyps Neg Hx    Esophageal cancer Neg Hx    Stomach cancer Neg Hx    Rectal cancer Neg Hx     The  following portions of the patient's history were reviewed and updated as appropriate: allergies, current medications, past family history, past medical history, past social history, past surgical history and problem list.  Review of Systems Pertinent items noted in HPI and remainder of comprehensive ROS otherwise negative.  Physical Exam:  BP 96/60   Pulse 68   Resp 16   Ht 5\' 1"  (1.549 m)   Wt 117 lb (53.1 kg)   BMI 22.11 kg/m  Physical Exam Vitals and nursing note reviewed.  Constitutional:      Appearance: Normal appearance.  Cardiovascular:     Rate and Rhythm: Normal rate.  Pulmonary:     Effort: Pulmonary effort is normal.     Breath sounds: Normal breath sounds.  Abdominal:     Palpations: Abdomen is soft.     Tenderness: There is no abdominal tenderness.  Neurological:     General: No focal deficit present.     Mental Status: She is alert and oriented to person, place, and time.  Psychiatric:        Mood and Affect: Mood normal.        Behavior: Behavior normal.        Thought Content: Thought content normal.        Judgment: Judgment normal.        Assessment and Plan:   1. Adnexal mass Suspect likely benign process given characteristics of mass. No alarm symptoms present. Will repeat CA-125 and pelvic MRI for further characterization. Noted cyst may be simple ovarian cyst, peritoneal inclusion cyst, or a malignant process.  - CA 125; Future - MR PELVIS W CONTRAST; Future    Routine preventative health maintenance measures emphasized. Please refer to After Visit Summary for other counseling recommendations.   Follow-up: No follow-ups on file.      Lorriane Shire, MD Obstetrician & Gynecologist, Faculty [facility] for [employer], [facility]

## 2022-12-15 ENCOUNTER — Other Ambulatory Visit: Payer: Medicare Other

## 2022-12-15 DIAGNOSIS — R1900 Intra-abdominal and pelvic swelling, mass and lump, unspecified site: Secondary | ICD-10-CM | POA: Diagnosis not present

## 2022-12-15 NOTE — Progress Notes (Signed)
[facility] presents [place] CA125 [facility]. Patient was sent to the lab to have labs drawn. Danielle Fox l Krystian Ferrentino, CMA

## 2022-12-16 LAB — CA 125: Cancer Antigen (CA) 125: 15.1 U/mL (ref 0.0–38.1)

## 2022-12-20 NOTE — Addendum Note (Signed)
Addended by: Harlon Ditty on: 12/20/2022 04:15 PM   Modules accepted: Orders

## 2022-12-21 ENCOUNTER — Other Ambulatory Visit: Payer: Self-pay

## 2022-12-21 DIAGNOSIS — N9489 Other specified conditions associated with female genital organs and menstrual cycle: Secondary | ICD-10-CM

## 2023-01-03 DIAGNOSIS — J449 Chronic obstructive pulmonary disease, unspecified: Secondary | ICD-10-CM | POA: Diagnosis not present

## 2023-02-07 ENCOUNTER — Encounter: Payer: Self-pay | Admitting: Emergency Medicine

## 2023-03-12 ENCOUNTER — Encounter: Payer: Self-pay | Admitting: Family Medicine

## 2023-03-14 ENCOUNTER — Other Ambulatory Visit: Payer: Self-pay | Admitting: Emergency Medicine

## 2023-03-14 ENCOUNTER — Other Ambulatory Visit: Payer: Self-pay | Admitting: Family

## 2023-03-14 MED ORDER — ESOMEPRAZOLE MAGNESIUM 40 MG PO CPDR: 40.0000 mg | DELAYED_RELEASE_CAPSULE | Freq: Every day | ORAL | 3 refills | Status: DC

## 2023-05-30 DIAGNOSIS — K080 Exfoliation of teeth due to systemic causes: Secondary | ICD-10-CM | POA: Diagnosis not present

## 2023-07-26 ENCOUNTER — Ambulatory Visit: Admitting: Internal Medicine

## 2023-07-29 ENCOUNTER — Ambulatory Visit (INDEPENDENT_AMBULATORY_CARE_PROVIDER_SITE_OTHER): Admitting: Student

## 2023-07-29 ENCOUNTER — Other Ambulatory Visit: Payer: Self-pay | Admitting: Family Medicine

## 2023-07-29 ENCOUNTER — Encounter: Payer: Self-pay | Admitting: Student

## 2023-07-29 ENCOUNTER — Ambulatory Visit: Payer: Self-pay

## 2023-07-29 VITALS — BP 110/80 | HR 93 | Temp 97.8°F | Ht 61.0 in | Wt 122.0 lb

## 2023-07-29 DIAGNOSIS — J441 Chronic obstructive pulmonary disease with (acute) exacerbation: Secondary | ICD-10-CM

## 2023-07-29 DIAGNOSIS — R053 Chronic cough: Secondary | ICD-10-CM

## 2023-07-29 LAB — POC COVID19 BINAXNOW: SARS Coronavirus 2 Ag: NEGATIVE

## 2023-07-29 MED ORDER — PREDNISONE 10 MG PO TABS
ORAL_TABLET | ORAL | 0 refills | Status: DC
Start: 2023-07-29 — End: 2023-08-02

## 2023-07-29 MED ORDER — AZITHROMYCIN 250 MG PO TABS
ORAL_TABLET | ORAL | 0 refills | Status: DC
Start: 2023-07-29 — End: 2023-08-02

## 2023-07-29 MED ORDER — PROMETHAZINE-DM 6.25-15 MG/5ML PO SYRP: 5.0000 mL | ORAL_SOLUTION | Freq: Four times a day (QID) | ORAL | 0 refills | Status: DC | PRN

## 2023-07-29 MED ORDER — ALBUTEROL SULFATE HFA 108 (90 BASE) MCG/ACT IN AERS: 1.0000 | INHALATION_SPRAY | Freq: Four times a day (QID) | RESPIRATORY_TRACT | 2 refills | Status: DC | PRN

## 2023-07-29 NOTE — Assessment & Plan Note (Addendum)
 Prior smoker.  Uses Symbicort  inhaler twice daily.  Z-Pak and prednisone  taper prescribed.  Albuterol  inhaler also sent to pharmacy.  Medication and common side effects reviewed with the patient; patient voiced understanding and had no further questions at this time.  Ensure adequate hydration, use of OTC medications such as Mucinex .  Patient has Tessalon  Perles for cough at home.  Patient instructed to seek emergency care or return to clinic if worsening symptoms, fever, increased shortness of breath. If cough persists consider referral to pulmonologist.  Discussed pneumonia vaccine-patient states she will get at pharmacy.

## 2023-07-29 NOTE — Patient Instructions (Signed)
 Please return to clinic if noticing increasing shortness of breath, fever, or worsening of symptoms.

## 2023-07-29 NOTE — Telephone Encounter (Signed)
  Chief Complaint: follow-up question Symptoms: chronic cough (per chart review) Frequency: intermittent Pertinent Negatives: Patient denie Disposition: [] ED /[] Urgent Care (no appt availability in office) / [] Appointment(In office/virtual)/ []  [place] [facility]/ [] Home Care/ [] Refused Recommended Disposition /[] [place] [facility]/ []  Follow-up with PCP Additional Notes: Pt seen today by Camilo Cella, NP for chronic cough and started on medications. Pt asking if she can get a reill on promethazine  DM as well. Will pend and send medication request.    Reason for Disposition . [1] Follow-up call from patient regarding patient's clinical status AND [2] information NON-URGENT  Answer Assessment - Initial Assessment Questions 1. REASON FOR CALL or QUESTION: "What is your reason for calling today?" or "How can I best help you?" or "What question do you have that I can help answer?"     Asking for medication refill 2. CALLER: Document the source of call. (e.g., laboratory, patient).     patient  Protocols used: PCP Call - No Triage-A-AH

## 2023-07-29 NOTE — Progress Notes (Signed)
 Subjective:     Patient ID: Danielle Fox, female    DOB: Apr 02, 1950, 73 y.o.   MRN: [id number]  Chief Complaint  Patient presents with   Cough    Cough with mucus; x1-2wk; congestion; no fever; little wheezing and SOB    HPI  Danielle Fox is a 73 y.o. female presents for increased cough and congestion   Onset:  10 days  ago  Course: same- no improvement Meds tried: Promethazine  Sick contacts: denies  Nasal discharge (color,laterality): some  Cough: yes Productive cough: yes, Color: yellow Fever: Denies  Headache/face pain: no  Double sickening: no  Tooth pain: no  Sneezing: yes  Itchy scratchy throat: no  Seasonal sx: no  Flu Risk Factors Headache: no  Muscle aches: no  Severe fatigue: no   Increased sputum, increased SOB, +wheezing  Patient denies fever, chills,  CP, palpitations, edema, HA, vision changes, N/V/D, abdominal pain, urinary symptoms, rash, weight changes, and recent illness or hospitalizations.   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   Preventative health care 06/29/2016   Pruritus 06/29/2016   Pyloric stenosis in adult 05/2019   Welcome to Medicare preventive visit 06/29/2016    Past Surgical History:  Procedure Laterality Date   ABDOMINAL HYSTERECTOMY     APPENDECTOMY     COLONOSCOPY     01-14-2005   DILATION AND CURETTAGE OF UTERUS     FINE NEEDLE ASPIRATION  02/27/2021   Procedure: FINE NEEDLE ASPIRATION (FNA) LINEAR;   Surgeon: Prudy Brownie, DO;  Location: [facility];  Service: Pulmonary;;   SINUS IRRIGATION  04/02/2015   TUBAL LIGATION     UPPER GASTROINTESTINAL ENDOSCOPY  11/22/2018   VIDEO BRONCHOSCOPY WITH ENDOBRONCHIAL ULTRASOUND Bilateral 02/27/2021   Procedure: VIDEO BRONCHOSCOPY WITH ENDOBRONCHIAL ULTRASOUND;  Surgeon: Prudy Brownie, DO;  Location: [facility];  Service: Pulmonary;  Laterality: Bilateral;    Family History  Problem Relation Age of Onset   Heart disease Mother        cabg, MI   Alcohol abuse Father    Cirrhosis Father    Heart disease Father        MI   Colon cancer Neg Hx    Colon polyps Neg Hx    Esophageal cancer Neg Hx    Stomach cancer Neg Hx    Rectal cancer Neg Hx     Social History   Socioeconomic History   Marital status: Married    Spouse name: Not on file   Number of children: 3   Years of education: Not on file   Highest education level: Associate degree: occupational, Scientist, product/process development, or vocational program  Occupational History   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[employer], retired    Associate Professor: [employer]  Tobacco Use   Smoking status: Former    Current packs/day: 0.00    Average  packs/day: 1 pack/day for 48.0 years (48.0 ttl pk-yrs)    Types: Cigarettes    Start date: 02/19/1966    Quit date: 02/19/2014    Years since quitting: 9.4   Smokeless tobacco: Never   Tobacco comments:    Quit and has no desire to smoke again,  Vaping Use   Vaping status: Never Used  Substance and Sexual Activity   Alcohol use: Yes    Alcohol/week: 0.0 standard drinks of alcohol    Comment: socially   Drug use: No   Sexual activity: Yes    Partners: Male  Other Topics Concern   Not on file  Social History Narrative   Exercise-- walks dog on occasion   Right Handed   One Story Home   Does not drink caffeine    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   Social Drivers of Corporate investment banker Strain: Low Risk  (07/28/2023)   Overall Financial Resource  Strain (CARDIA)    Difficulty of Paying Living Expenses: Not hard at all  Food Insecurity: No Food Insecurity (07/28/2023)   Hunger Vital Sign    Worried About Running Out of Food in the Last Year: Never true    Ran Out of Food in the Last Year: Never true  Transportation Needs: No Transportation Needs (07/28/2023)   PRAPARE - Administrator, Civil Service (Medical): No    Lack of Transportation (Non-Medical): No  Physical Activity: Insufficiently Active (07/28/2023)   Exercise Vital Sign    Days of Exercise per Week: 1 day    Minutes of Exercise per Session: 30 min  Stress: No Stress Concern Present (07/28/2023)   [employer] of Occupational Health - Occupational Stress Questionnaire    Feeling of Stress : Not at all  Social Connections: Socially Integrated (07/28/2023)   Social Connection and Isolation Panel [NHANES]    Frequency of Communication with Friends and Family: More than three times a week    Frequency of Social Gatherings with Friends and Family: More than three times a week    Attends Religious Services: More than 4 times per year    Active Member of [employer] or Organizations: Yes    Attends Engineer, structural: More than 4 times per year    Marital Status: Married  Catering manager Violence: Not At Risk (08/27/2022)   Humiliation, Afraid, Rape, and Kick questionnaire    Fear of Current or Ex-Partner: No    Emotionally Abused: No    Physically Abused: No    Sexually Abused: No    Outpatient Medications Prior to Visit  Medication Sig Dispense Refill   budesonide -formoterol  (SYMBICORT ) 160-4.5 MCG/ACT inhaler INHALE 2 PUFFS INTO THE LUNGS TWICE DAILY IN THE MORNING AND AT BEDTIME 10.2 g 5   cetirizine  (ZYRTEC  ALLERGY) 10 MG tablet Take 1 tablet (10 mg total) by mouth daily. 30 tablet 0   Dextromethorphan HBr (DELSYM PO) Take by mouth as directed.     esomeprazole  (NEXIUM ) 40 MG capsule Take 1 capsule (40 mg total) by mouth daily before breakfast. 90  capsule 3   Multiple Vitamins-Minerals (MULTIVITAMIN WITH MINERALS) tablet Take 1 tablet by mouth daily.     promethazine -dextromethorphan (PROMETHAZINE -DM) 6.25-15 MG/5ML syrup Take 5 mLs by mouth 4 (four) times daily as needed. 240 mL 0   No facility-administered medications prior to visit.    Allergies  Allergen Reactions   Doxycycline  Nausea And Vomiting   Vancomycin   Nausea and abdominal pain   Erythromycin Base Nausea Only and Nausea And Vomiting    Other reaction(s): GI Upset (intolerance)    ROS See HPI    Objective:     Physical Exam  General: No acute distress. Awake and conversant.  Eyes: Normal conjunctiva, anicteric. Round symmetric pupils.  ENT: No nasal discharge. Throat WNL, no exudate. Respiratory: Respirations are non-labored. +expiratory wheezing Skin: Warm. No rashes or ulcers.  Psych: Alert and oriented. Cooperative, Appropriate mood and affect, Normal judgment.  CV: RRR, No lower extremity edema.  MSK: Normal ambulation. No clubbing or cyanosis.     BP 110/80   Pulse 93   Temp 97.8 F (36.6 C) (Oral)   Ht 5\' 1"  (1.549 m)   Wt 122 lb (55.3 kg)   SpO2 96%   BMI 23.05 kg/m  Wt Readings from Last 3 Encounters:  07/29/23 122 lb (55.3 kg)  12/03/22 117 lb (53.1 kg)  11/30/22 116 lb (52.6 kg)       Assessment & Plan:   Problem List Items Addressed This Visit     Chronic cough - Primary   Combination of upper airway cough syndrome, reflux and bring former smoker.  Increased cough likely due to current exacerbation, if cough persists consider pulmonology referral      Relevant Orders   POC COVID-19 (Completed)   COPD exacerbation (HCC)   Prior smoker.  Uses Symbicort  inhaler twice daily.  Z-Pak and prednisone  taper prescribed.  Albuterol  inhaler also sent to pharmacy.  Medication and common side effects reviewed with the patient; patient voiced understanding and had no further questions at this time.  Ensure adequate hydration, use of OTC  medications such as Mucinex .  Patient has Tessalon  Perles for cough at home.  Patient instructed to seek emergency care or return to clinic if worsening symptoms, fever, increased shortness of breath. If cough persists consider referral to pulmonologist.  Discussed pneumonia vaccine-patient states she will get at pharmacy.        Relevant Medications   azithromycin (ZITHROMAX) 250 MG tablet   predniSONE  (DELTASONE ) 10 MG tablet   albuterol  (VENTOLIN  HFA) 108 (90 Base) MCG/ACT inhaler    I am having Danielle M. Schissler "Mickey" start on azithromycin, predniSONE , and albuterol . I am also having her maintain her multivitamin with minerals, budesonide -formoterol , cetirizine , Dextromethorphan HBr (DELSYM PO), promethazine -dextromethorphan, and esomeprazole .  Meds ordered this encounter  Medications   azithromycin (ZITHROMAX) 250 MG tablet    Sig: Take 2 tablets on day 1, then 1 tablet daily on days 2 through 5    Dispense:  6 tablet    Refill:  0    Supervising Provider:   Randie Bustle A [[id number]]   predniSONE  (DELTASONE ) 10 MG tablet    Sig: Take 4 tablets (40 mg total) by mouth daily with breakfast for 2 days, THEN 2 tablets (20 mg total) daily with breakfast for 2 days, THEN 1 tablet (10 mg total) daily with breakfast for 2 days.    Dispense:  14 tablet    Refill:  0    Supervising Provider:   Randie Bustle A [[id number]]   albuterol  (VENTOLIN  HFA) 108 (90 Base) MCG/ACT inhaler    Sig: Inhale 1-2 puffs into the lungs every 6 (six) hours as needed for wheezing or shortness of breath.    Dispense:  8 g    Refill:  2    Supervising Provider:   Randie Bustle A [[id number]]

## 2023-07-29 NOTE — Telephone Encounter (Signed)
 Copied from CRM [id number]. Topic: Clinical - Medication Refill >> Jul 29, 2023  4:23 PM Leah C wrote: Medication: promethazine -dextromethorphan (PROMETHAZINE -DM) 6.25-15 MG/5ML syrup  Has the patient contacted their pharmacy? No. Patient just left doctors appointment today and forget to tell Nurse that she needed a refill for this medicine.  (Agent: If no, request that the patient contact the pharmacy for the refill. If patient does not wish to contact the pharmacy document the reason why and proceed with request.) (Agent: If yes, when and what did the pharmacy advise?)  This is the patient's preferred pharmacy:  [id number] [place], [place] - [address] [place] [address] Phone: [phone] Fax: [phone]   Is this the correct pharmacy for this prescription? Yes If no, delete pharmacy and type the correct one.   Has the prescription been filled recently? No  Is the patient out of the medication? Yes  Has the patient been seen for an appointment in the last year OR does the patient have an upcoming appointment? Yes  Can we respond through MyChart? Yes  Agent: Please be advised that Rx refills may take up to 3 business days. We ask that you follow-up with your pharmacy.

## 2023-07-29 NOTE — Assessment & Plan Note (Signed)
 Combination of upper airway cough syndrome, reflux and bring former smoker.  Increased cough likely due to current exacerbation, if cough persists consider pulmonology referral

## 2023-08-02 ENCOUNTER — Other Ambulatory Visit: Payer: Self-pay | Admitting: Internal Medicine

## 2023-08-02 ENCOUNTER — Encounter: Payer: Self-pay | Admitting: Internal Medicine

## 2023-08-02 ENCOUNTER — Ambulatory Visit: Admitting: Internal Medicine

## 2023-08-02 VITALS — BP 136/70 | HR 66 | Ht 61.0 in | Wt 122.0 lb

## 2023-08-02 DIAGNOSIS — R112 Nausea with vomiting, unspecified: Secondary | ICD-10-CM | POA: Diagnosis not present

## 2023-08-02 DIAGNOSIS — Z8719 Personal history of other diseases of the digestive system: Secondary | ICD-10-CM

## 2023-08-02 DIAGNOSIS — R1013 Epigastric pain: Secondary | ICD-10-CM | POA: Diagnosis not present

## 2023-08-02 DIAGNOSIS — K311 Adult hypertrophic pyloric stenosis: Secondary | ICD-10-CM

## 2023-08-02 MED ORDER — ESOMEPRAZOLE MAGNESIUM 40 MG PO CPDR: 40.0000 mg | DELAYED_RELEASE_CAPSULE | Freq: Two times a day (BID) | ORAL | 5 refills | Status: DC

## 2023-08-02 NOTE — Progress Notes (Signed)
 Danielle Fox 73 y.o. 06/21/1950 [id number]  Assessment & Plan:   Encounter Diagnoses  Name Primary?   Pyloric stenosis in adult Yes   Nausea and vomiting, unspecified vomiting type    Abdominal pain, epigastric    EGD to reassess what may be recurrent pyloric stenosis/gastric outlet obstruction.  Likely repeat balloon dilation.  She also has a history of intestinal metaplasia so we will evaluate for development of neoplasia also.  In the interim avoid raw vegetables and tough fibrous fruits.  Increase Nexium  to twice daily.  The risks and benefits as well as alternatives of endoscopic procedure(s) have been discussed and reviewed. All questions answered. The patient agrees to proceed.  She has interstitial lung disease but is not on oxygen and reports good exercise tolerance.  CC: Neda Balk, MD   Subjective:   Chief Complaint: Abdominal pain and nausea and vomiting  HPI 73 year old woman with a history of adult pyloric stenosis last seen in 2021 at which point she had this dilated.  She has done well since then but for the last 6 months she has had intermittent approximately weekly vomiting.  Often will vomit up partially or undigested food.  She has been gaining some weight.  Sometimes she just has a "stomachache" and does not vomit.  This is reminding her of prior problems she is had.  GI review of systems is otherwise negative.  Insulin is not on her list and I do not think she is taking these regularly.  Wt Readings from Last 3 Encounters:  08/02/23 122 lb (55.3 kg)  07/29/23 122 lb (55.3 kg)  12/03/22 117 lb (53.1 kg)   EGD March 16, 2019 with dilation of pyloric stenosis to 13 mm.  She had some gastritis changes.  There was some intestinal metaplasia.  Gastrin level was 29.  Chronic PPI use was recommended at that time.  She had stopped PPI after the previous dilation September 2020. Allergies  Allergen Reactions   Doxycycline  Nausea And Vomiting    Vancomycin      Nausea and abdominal pain   Erythromycin Base Nausea Only and Nausea And Vomiting    Other reaction(s): GI Upset (intolerance)   Current Meds  Medication Sig   albuterol  (VENTOLIN  HFA) 108 (90 Base) MCG/ACT inhaler Inhale 1-2 puffs into the lungs every 6 (six) hours as needed for wheezing or shortness of breath.   azithromycin  (ZITHROMAX ) 250 MG tablet Take 2 tablets on day 1, then 1 tablet daily on days 2 through 5   budesonide -formoterol  (SYMBICORT ) 160-4.5 MCG/ACT inhaler INHALE 2 PUFFS INTO THE LUNGS TWICE DAILY IN THE MORNING AND AT BEDTIME   cetirizine  (ZYRTEC  ALLERGY) 10 MG tablet Take 1 tablet (10 mg total) by mouth daily.   Dextromethorphan HBr (DELSYM PO) Take by mouth as directed.   esomeprazole  (NEXIUM ) 40 MG capsule Take 1 capsule (40 mg total) by mouth daily before breakfast.   Multiple Vitamins-Minerals (MULTIVITAMIN WITH MINERALS) tablet Take 1 tablet by mouth daily.   predniSONE  (DELTASONE ) 10 MG tablet Take 4 tablets (40 mg total) by mouth daily with breakfast for 2 days, THEN 2 tablets (20 mg total) daily with breakfast for 2 days, THEN 1 tablet (10 mg total) daily with breakfast for 2 days.   promethazine -dextromethorphan (PROMETHAZINE -DM) 6.25-15 MG/5ML syrup Take 5 mLs by mouth 4 (four) times daily as needed.   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   Preventative health care 06/29/2016   Pruritus 06/29/2016   Pyloric stenosis in adult 05/2019   Welcome to Medicare preventive visit 06/29/2016   Past Surgical History:  Procedure Laterality Date   ABDOMINAL HYSTERECTOMY     APPENDECTOMY     COLONOSCOPY     01-14-2005   DILATION AND CURETTAGE OF UTERUS     FINE NEEDLE ASPIRATION  02/27/2021   Procedure: FINE NEEDLE  ASPIRATION (FNA) LINEAR;  Surgeon: Prudy Brownie, DO;  Location: [facility];  Service: Pulmonary;;   SINUS IRRIGATION  04/02/2015   TUBAL LIGATION     UPPER GASTROINTESTINAL ENDOSCOPY  11/22/2018   VIDEO BRONCHOSCOPY WITH ENDOBRONCHIAL ULTRASOUND Bilateral 02/27/2021   Procedure: VIDEO BRONCHOSCOPY WITH ENDOBRONCHIAL ULTRASOUND;  Surgeon: Prudy Brownie, DO;  Location: [facility];  Service: Pulmonary;  Laterality: Bilateral;   Social History   Social History Narrative   Exercise-- walks dog on occasion   Right Handed   One Story Home   Does not drink caffeine    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   Review of Systems  As per HPI otherwise negative Objective:   Physical Exam @BP  136/70   Pulse 66   Ht 5\' 1"  (1.549 m)   Wt 122 lb (55.3 kg)   BMI 23.05 kg/m @  General:  Well-developed, well-nourished and in no acute distress Eyes:  anicteric. ENT:   Mouth and posterior pharynx free of lesions.  Lungs: Clear to auscultation bilaterally. Heart:   S1S2, no rubs, murmurs, gallops. Abdomen:  soft, non-tender,  no mass and BS+.  Neuro:  A&O x 3.  Psych:  appropriate mood and  Affect.   Data Reviewed: See HPI

## 2023-08-02 NOTE — Patient Instructions (Signed)
 You have been scheduled for an endoscopy. Please follow written instructions given to you at your visit today.  If you use inhalers (even only as needed), please bring them with you on the day of your procedure.  Follow a low fiber diet.   We have sent the following medications to your pharmacy for you to pick up at your convenience: Nexium : Take it 30 minutes before breakfast and supper.    _______________________________________________________  If your blood pressure at your visit was 140/90 or greater, please contact your primary care physician to follow up on this.  _______________________________________________________  If you are age 53 or older, your body mass index should be between 23-30. Your Body mass index is 23.05 kg/m. If this is out of the aforementioned range listed, please consider follow up with your Primary Care Provider.  If you are age 44 or younger, your body mass index should be between 19-25. Your Body mass index is 23.05 kg/m. If this is out of the aformentioned range listed, please consider follow up with your Primary Care Provider.   ________________________________________________________  The [facility] providers would like to encourage you to use MYCHART to communicate with providers for non-urgent requests or questions.  Due to long hold times on the telephone, sending your provider a message by [facility] may be a faster and more efficient way to get a response.  Please allow 48 business hours for a response.  Please remember that this is for non-urgent requests.  _______________________________________________________  I appreciate the opportunity to care for you. Loy Ruff, MD, [facility]

## 2023-08-03 ENCOUNTER — Other Ambulatory Visit (HOSPITAL_COMMUNITY): Payer: Self-pay

## 2023-08-03 ENCOUNTER — Telehealth: Payer: Self-pay

## 2023-08-03 NOTE — Telephone Encounter (Signed)
 Noted pt. advised

## 2023-08-03 NOTE — Telephone Encounter (Signed)
 Tonya with [employer] medic[employer] is calling about the quantity limit request. It has been approved.  [phone] if needing further information.

## 2023-08-03 NOTE — Telephone Encounter (Signed)
 PA request has been Submitted. New Encounter has been or will be created for follow up. For additional info see Pharmacy Prior Auth telephone encounter from 08-03-2023.

## 2023-08-03 NOTE — Telephone Encounter (Signed)
 Pharmacy Patient Advocate Encounter  Received notification from [facility] Medicare that Prior Authorization for Esomeprazole  Magnesium  40MG  dr capsules has been APPROVED from 08-03-2023 to 08-02-2024   PA #/Case ID/Reference #: [id number]

## 2023-08-03 NOTE — Telephone Encounter (Signed)
 Pharmacy Patient Advocate Encounter   Received notification from RX Request Messages that prior authorization for Esomeprazole  Magnesium  40MG  dr capsules is required/requested.   Insurance verification completed.   The patient is insured through [employer] .   Per test claim: PA required; PA submitted to above mentioned insurance via CoverMyMeds Key/confirmation #/EOC [id number] Status is pending

## 2023-08-15 ENCOUNTER — Telehealth: Payer: Self-pay | Admitting: Family Medicine

## 2023-08-15 NOTE — Telephone Encounter (Signed)
 Copied from CRM [id number]. Topic: Medicare AWV >> Aug 15, 2023 11:40 AM Juliana Ocean wrote: Reason for CRM: Called 08/15/2023 to sched AWV - Line Busy   Rosalee Collins; Care Guide Ambulatory Clinical Support [facility] Direct Dial: [phone]

## 2023-08-30 ENCOUNTER — Ambulatory Visit

## 2023-09-05 NOTE — Progress Notes (Unsigned)
 [facility] History and Physical   Primary Care Physician:  Domenica Harlene LABOR, MD   Reason for Procedure:  Pyloric stenosis plus history of gastric intestinal metaplasia  Plan:    EGD, possible dilation of pyloric stenosis     HPI: Danielle Fox is a 73 y.o. 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  She was seen in the clinic on June 3 and this procedure was arranged.  She is having intermittent vomiting of partially digested foods and sometimes just some dyspeptic complaints stomachache.   Last EGD March 16, 2019 with dilation of pyloric stenosis to 13 mm. She had some gastritis changes. There was some intestinal metaplasia. Gastrin level was 29. Chronic PPI use was recommended at that time. She had stopped PPI after the previous dilation September 2020.     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   Preventative health care 06/29/2016   Pruritus 06/29/2016   Pyloric stenosis in adult 05/2019   Welcome to Medicare preventive visit 06/29/2016    Past Surgical History:  Procedure Laterality Date   ABDOMINAL HYSTERECTOMY     APPENDECTOMY     COLONOSCOPY     01-14-2005   DILATION AND CURETTAGE OF UTERUS     FINE NEEDLE ASPIRATION  02/27/2021   Procedure: FINE NEEDLE ASPIRATION (FNA) LINEAR;  Surgeon: Brenna Adine CROME, DO;  Location: [facility];  Service: Pulmonary;;   SINUS IRRIGATION  04/02/2015   TUBAL LIGATION     UPPER GASTROINTESTINAL ENDOSCOPY  11/22/2018   VIDEO BRONCHOSCOPY WITH ENDOBRONCHIAL ULTRASOUND Bilateral 02/27/2021   Procedure: VIDEO BRONCHOSCOPY WITH ENDOBRONCHIAL ULTRASOUND;  Surgeon: Brenna Adine CROME,  DO;  Location: [facility];  Service: Pulmonary;  Laterality: Bilateral;      Current Outpatient Medications  Medication Sig Dispense Refill   albuterol  (VENTOLIN  HFA) 108 (90 Base) MCG/ACT inhaler Inhale 1-2 puffs into the lungs every 6 (six) hours as needed for wheezing or shortness of breath. 8 g 2   budesonide -formoterol  (SYMBICORT ) 160-4.5 MCG/ACT inhaler INHALE 2 PUFFS INTO THE LUNGS TWICE DAILY IN THE MORNING AND AT BEDTIME 10.2 g 5   cetirizine  (ZYRTEC  ALLERGY) 10 MG tablet Take 1 tablet (10 mg total) by mouth daily. 30 tablet 0   esomeprazole  (NEXIUM ) 40 MG capsule TAKE 1 CAPSULE (40 MG TOTAL) BY MOUTH 2 (TWO) TIMES DAILY BEFORE A MEAL. 60 capsule 5   Multiple Vitamins-Minerals (MULTIVITAMIN WITH MINERALS) tablet Take 1 tablet by mouth daily.     Current Facility-Administered Medications  Medication Dose Route Frequency Provider Last Rate Last Admin   0.9 %  sodium chloride  infusion  500 mL Intravenous Once Avram Lupita BRAVO, MD        Allergies as of 09/06/2023 - Review Complete 09/06/2023  Allergen Reaction Noted   Doxycycline  Nausea And Vomiting 08/30/2017   Erythromycin base Nausea Only and Nausea And Vomiting 04/11/2015   Vancomycin  Other (See Comments) 12/28/2019    Family History  Problem Relation Age of Onset   Heart disease Mother        cabg, MI   Alcohol abuse Father    Cirrhosis Father    Heart disease Father  MI   Colon cancer Neg Hx    Colon polyps Neg Hx    Esophageal cancer Neg Hx    Stomach cancer Neg Hx    Rectal cancer Neg Hx     Social History   Socioeconomic History   Marital status: Married    Spouse name: Not on file   Number of children: 3   Years of education: Not on file   Highest education level: Associate degree: occupational, Scientist, product/process development, or vocational program  Occupational History   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[employer], retired    Associate Professor: Print production planner   Occupation: retired  Tobacco Use   Smoking status: Former    Current packs/day:  0.00    Average packs/day: 1 pack/day for 48.0 years (48.0 ttl pk-yrs)    Types: Cigarettes    Start date: 02/19/1966    Quit date: 02/19/2014    Years since quitting: 9.5   Smokeless tobacco: Never   Tobacco comments:    Quit and has no desire to smoke again,  Vaping Use   Vaping status: Never Used  Substance and Sexual Activity   Alcohol use: Yes    Alcohol/week: 0.0 standard drinks of alcohol    Comment: socially   Drug use: No   Sexual activity: Yes    Partners: Male  Other Topics Concern   Not on file  Social History Narrative   Exercise-- walks dog on occasion   Right Handed   One Story Home   Does not drink caffeine    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   Social Drivers of Corporate investment banker Strain: Low Risk  (07/28/2023)   Overall Financial Resource Strain (CARDIA)    Difficulty of Paying Living Expenses: Not hard at all  Food Insecurity: No Food Insecurity (07/28/2023)   Hunger Vital Sign    Worried About Running Out of Food in the Last Year: Never true    Ran Out of Food in the Last Year: Never true  Transportation Needs: No Transportation Needs (07/28/2023)   PRAPARE - Administrator, Civil Service (Medical): No    Lack of Transportation (Non-Medical): No  Physical Activity: Insufficiently Active (07/28/2023)   Exercise Vital Sign    Days of Exercise per Week: 1 day    Minutes of Exercise per Session: 30 min  Stress: No Stress Concern Present (07/28/2023)   [employer] of Occupational Health - Occupational Stress Questionnaire    Feeling of Stress : Not at all  Social Connections: Socially Integrated (07/28/2023)   Social Connection and Isolation Panel    Frequency of Communication with Friends and Family: More than three times a week    Frequency of Social Gatherings with Friends and Family: More than three times a week    Attends Religious Services: More than 4 times per year    Active Member of [employer] or  Organizations: Yes    Attends Engineer, structural: More than 4 times per year    Marital Status: Married  Catering manager Violence: Not At Risk (08/27/2022)   Humiliation, Afraid, Rape, and Kick questionnaire    Fear of Current or Ex-Partner: No    Emotionally Abused: No    Physically Abused: No    Sexually Abused: No    Review of Systems:  All other review of systems negative except as mentioned in the HPI.  Physical Exam: Vital signs BP (!) 112/59   Pulse 73   Temp 97.7 F (36.5  C) (Temporal)   Ht 5' 1 (1.549 m)   Wt 122 lb (55.3 kg)   SpO2 96%   BMI 23.05 kg/m   General:   Alert,  Well-developed, well-nourished, pleasant and cooperative in NAD Lungs:  Clear throughout to auscultation.   Heart:  Regular rate and rhythm; no murmurs, clicks, rubs,  or gallops. Abdomen:  Soft, nontender and nondistended. Normal bowel sounds.   Neuro/Psych:  Alert and cooperative. Normal mood and affect. A and O x 3   @Jibreel Fedewa  CHARLENA Commander, MD, [facility] [phone] (pager) 09/06/2023 10:34 AM@

## 2023-09-06 ENCOUNTER — Ambulatory Visit: Admitting: Internal Medicine

## 2023-09-06 ENCOUNTER — Encounter: Payer: Self-pay | Admitting: Internal Medicine

## 2023-09-06 VITALS — BP 84/57 | HR 71 | Temp 97.7°F | Resp 15 | Ht 61.0 in | Wt 122.0 lb

## 2023-09-06 DIAGNOSIS — K317 Polyp of stomach and duodenum: Secondary | ICD-10-CM

## 2023-09-06 DIAGNOSIS — K2950 Unspecified chronic gastritis without bleeding: Secondary | ICD-10-CM

## 2023-09-06 DIAGNOSIS — R112 Nausea with vomiting, unspecified: Secondary | ICD-10-CM

## 2023-09-06 DIAGNOSIS — K311 Adult hypertrophic pyloric stenosis: Secondary | ICD-10-CM | POA: Diagnosis not present

## 2023-09-06 DIAGNOSIS — R1013 Epigastric pain: Secondary | ICD-10-CM

## 2023-09-06 DIAGNOSIS — K3189 Other diseases of stomach and duodenum: Secondary | ICD-10-CM

## 2023-09-06 MED ORDER — SODIUM CHLORIDE 0.9 % IV SOLN: 500.0000 mL | Freq: Once | INTRAVENOUS | Status: DC

## 2023-09-06 NOTE — Patient Instructions (Addendum)
 I biopsied and removed a small stomach polyp that looks benign.  I dilated the pylorus as in the past, up to 13 mm.  I took biopsies of the stomach as well checking the lining.  Please stay on a soft diet today and advance to a normal diet tomorrow if you are doing well.  Continue Nexium  twice daily to reduce the risk of this recurring.  I appreciate the opportunity to care for you.  Danielle CHARLENA Commander, MD, [facility]   Discharge instructions given. Biopsies taken. Handout on on Gastritis and a Dilatation diet. Resume previous medications. YOU HAD AN ENDOSCOPIC PROCEDURE TODAY AT THE [facility]:   Refer to the procedure report that was given to you for any specific questions about what was found during the examination.  If the procedure report does not answer your questions, please call your gastroenterologist to clarify.  If you requested that your care partner not be given the details of your procedure findings, then the procedure report has been included in a sealed envelope for you to review at your convenience later.  YOU SHOULD EXPECT: Some feelings of bloating in the abdomen. Passage of more gas than usual.  Walking can help get rid of the air that was put into your GI tract during the procedure and reduce the bloating. If you had a lower endoscopy (such as a colonoscopy or flexible sigmoidoscopy) you may notice spotting of blood in your stool or on the toilet paper. If you underwent a bowel prep for your procedure, you may not have a normal bowel movement for a few days.  Please Note:  You might notice some irritation and congestion in your nose or some drainage.  This is from the oxygen used during your procedure.  There is no need for concern and it should clear up in a day or so.  SYMPTOMS TO REPORT IMMEDIATELY:   Following upper endoscopy (EGD)  Vomiting of blood or coffee ground material  New chest pain or pain under the shoulder blades  Painful or persistently  difficult swallowing  New shortness of breath  Fever of 100F or higher  Black, tarry-looking stools  For urgent or emergent issues, a gastroenterologist can be reached at any hour by calling [phone]. Do not use MyChart messaging for urgent concerns.    DIET:  We do recommend a small meal at first, but then you may proceed to your regular diet.  Drink plenty of fluids but you should avoid alcoholic beverages for 24 hours.  ACTIVITY:  You should plan to take it easy for the rest of today and you should NOT DRIVE or use heavy machinery until tomorrow (because of the sedation medicines used during the test).    FOLLOW UP: Our staff will call the number listed on your records the next business day following your procedure.  We will call around 7:15- 8:00 am to check on you and address any questions or concerns that you may have regarding the information given to you following your procedure. If we do not reach you, we will leave a message.     If any biopsies were taken you will be contacted by phone or by letter within the next 1-3 weeks.  Please call us  at [phone] if you have not heard about the biopsies in 3 weeks.    SIGNATURES/CONFIDENTIALITY: You and/or your care partner have signed paperwork which will be entered into your electronic medical record.  These signatures attest to the fact  that that the information above on your After Visit Summary has been reviewed and is understood.  Full responsibility of the confidentiality of this discharge information lies with you and/or your care-partner.

## 2023-09-06 NOTE — Op Note (Signed)
 [facility] Patient Name: Danielle Fox Procedure Date: 09/06/2023 10:34 AM MRN: [id number] Endoscopist: Lupita FORBES Commander , MD, [id number] Age: 73 Referring MD:  Date of Birth: [birth year] Gender: Female Account #: [id number] Procedure:                Upper GI endoscopy Indications:              Pyloric stenosis, Follow-up of pyloric stenosis,                            For therapy of pyloric stenosis Medicines:                Monitored Anesthesia Care Procedure:                Pre-Anesthesia Assessment:                           - Prior to the procedure, a History and Physical                            was performed, and patient medications and                            allergies were reviewed. The patient's tolerance of                            previous anesthesia was also reviewed. The risks                            and benefits of the procedure and the sedation                            options and risks were discussed with the patient.                            All questions were answered, and informed consent                            was obtained. Prior Anticoagulants: The patient has                            taken no anticoagulant or antiplatelet agents. ASA                            Grade Assessment: II - A patient with mild systemic                            disease. After reviewing the risks and benefits,                            the patient was deemed in satisfactory condition to                            undergo the procedure.  After obtaining informed consent, the endoscope was                            passed under direct vision. Throughout the                            procedure, the patient's blood pressure, pulse, and                            oxygen saturations were monitored continuously. The                            Olympus Scope [id number] was introduced through the                            mouth, and  advanced to the second part of duodenum.                            The upper GI endoscopy was accomplished without                            difficulty. The patient tolerated the procedure                            well. Scope In: Scope Out: Findings:                 A benign-appearing, intrinsic severe stenosis was                            found at the pylorus. This was traversed after                            dilation. A TTS dilator was passed through the                            scope. Dilation with an 01-11-12 mm balloon dilator                            was performed to 13 mm. The dilation site was                            examined and showed moderate improvement in luminal                            narrowing. Estimated blood loss was minimal.                           A single 6 mm sessile polyp with no bleeding and no                            stigmata of recent bleeding was found in the  gastric antrum. The polyp was removed with a                            piecemeal technique using a cold biopsy forceps.                            Resection and retrieval were complete. Verification                            of patient identification for the specimen was                            done. Estimated blood loss was minimal.                           Diffuse mild inflammation characterized by erythema                            and mottling was found in the gastric body and in                            the gastric antrum. Biopsies were taken with a cold                            forceps for histology. Verification of patient                            identification for the specimen was done. Estimated                            blood loss was minimal.                           The exam was otherwise without abnormality.                           The cardia and gastric fundus were otherwise normal                            on  retroflexion. Complications:            No immediate complications. Estimated Blood Loss:     Estimated blood loss was minimal. Impression:               - Gastric stenosis was found at the pylorus.                            Dilated. 13 mm                           - A single gastric polyp. Resected and retrieved.                           - Gastritis. Biopsied. Sydney protocol used antrum  and body bottles                           - The examination was otherwise normal. Recommendation:           - Patient has a contact number available for                            emergencies. The signs and symptoms of potential                            delayed complications were discussed with the                            patient. Return to normal activities tomorrow.                            Written discharge instructions were provided to the                            patient.                           - Soft diet today. Normal tomorrow.                           - Continue present medications. recently increased                            to bid Nexium                            - Await pathology results. Lupita FORBES Commander, MD 09/06/2023 11:13:19 AM This report has been signed electronically.

## 2023-09-06 NOTE — Progress Notes (Signed)
 To pacu, VSS. Report to Rn.tb

## 2023-09-06 NOTE — Progress Notes (Signed)
 Called to room to assist during endoscopic procedure.  Patient ID and intended procedure confirmed with present staff. Received instructions for my participation in the procedure from the performing physician.

## 2023-09-07 ENCOUNTER — Telehealth: Payer: Self-pay | Admitting: *Deleted

## 2023-09-07 NOTE — Telephone Encounter (Signed)
 Attempted post procedure follow up call.  No answer - LVM.

## 2023-09-09 LAB — SURGICAL PATHOLOGY

## 2023-09-11 ENCOUNTER — Ambulatory Visit: Payer: Self-pay | Admitting: Internal Medicine

## 2023-09-12 NOTE — Telephone Encounter (Signed)
 Spoke with the patient and advised of the recommendations. She agrees to this plan of care. States she is doing fine and specifically has not had any abdominal pain or vomiting. No questions at the end of the call.

## 2023-09-12 NOTE — Telephone Encounter (Signed)
-----   Message from Lupita Commander sent at 09/11/2023  9:19 PM EDT ----- [facility] - 1 year EGD recall  Office RN staff - please c all patient and explain that stomach biopsies benign but something called intestinal metaplasia seen - this can be pre-cancerous - I am planning a routine repeat endoscopy  in 1 year  Also ask if she has any persistent abdominal pain or vomiting after the dilation of the pylorus and let me know what she says  Thx ----- Message ----- From: Interface, Lab In Three Zero One Sent: 09/09/2023   5:12 PM EDT To: Lupita FORBES Commander, MD

## 2023-09-16 DIAGNOSIS — H5213 Myopia, bilateral: Secondary | ICD-10-CM | POA: Diagnosis not present

## 2023-09-27 ENCOUNTER — Ambulatory Visit (INDEPENDENT_AMBULATORY_CARE_PROVIDER_SITE_OTHER): Admitting: Physician Assistant

## 2023-09-27 ENCOUNTER — Ambulatory Visit: Payer: Self-pay

## 2023-09-27 ENCOUNTER — Encounter: Payer: Self-pay | Admitting: Physician Assistant

## 2023-09-27 VITALS — BP 117/65 | HR 98 | Temp 97.9°F | Ht 61.0 in | Wt 122.4 lb

## 2023-09-27 DIAGNOSIS — J441 Chronic obstructive pulmonary disease with (acute) exacerbation: Secondary | ICD-10-CM | POA: Diagnosis not present

## 2023-09-27 DIAGNOSIS — R051 Acute cough: Secondary | ICD-10-CM

## 2023-09-27 LAB — POC COVID19 BINAXNOW: SARS Coronavirus 2 Ag: NEGATIVE

## 2023-09-27 MED ORDER — AZITHROMYCIN 250 MG PO TABS: ORAL_TABLET | ORAL | 0 refills | Status: AC

## 2023-09-27 NOTE — Telephone Encounter (Signed)
 FYI Only or Action Required?: FYI only for provider.  Patient was last seen in primary care on 07/29/2023 by Wheeler Harlene CROME, NP.  Called Nurse Triage reporting Cough.  Symptoms began 4 days ago.  Interventions attempted: Nothing.  Symptoms are: gradually worsening.  Triage Disposition: See PCP When Office is Open (Within 3 Days)  Patient/caregiver understands and will follow disposition?:    Copied from CRM #[id number]. Topic: Clinical - Red Word Triage >> Sep 27, 2023  8:04 AM Willma R wrote: Red Word that prompted transfer to Nurse Triage: Patient states she has had a bad cold for the last 4 days. Has productive cough, fever, and just overall feeling terrible. Reason for Disposition  Cough has been present for > 3 weeks  Answer Assessment - Initial Assessment Questions 1. ONSET: When did the cough begin?      X 4 days  2. SEVERITY: How bad is the cough today?      moderate 3. SPUTUM: Describe the color of your sputum (e.g., none, dry cough; clear, white, yellow, green)     yellow 4. HEMOPTYSIS: Are you coughing up any blood? If Yes, ask: How much? (e.g., flecks, streaks, tablespoons, etc.)     no 5. DIFFICULTY BREATHING: Are you having difficulty breathing? If Yes, ask: How bad is it? (e.g., mild, moderate, severe)      no 6. FEVER: Do you have a fever? If Yes, ask: What is your temperature, how was it measured, and when did it start?   99.4 7. CARDIAC HISTORY: Do you have any history of heart disease? (e.g., heart attack, congestive heart failure)      na 8. LUNG HISTORY: Do you have any history of lung disease?  (e.g., pulmonary embolus, asthma, emphysema)     na 9. PE RISK FACTORS: Do you have a history of blood clots? (or: recent major surgery, recent prolonged travel, bedridden)     na 10. OTHER SYMPTOMS: Do you have any other symptoms? (e.g., runny nose, wheezing, chest pain)       Runny nose, wheezing 11. PREGNANCY: Is there any  chance you are pregnant? When was your last menstrual period?       no 12. TRAVEL: Have you traveled out of the country in the last month? (e.g., travel history, exposures)       na  Protocols used: Cough - Acute Productive-A-AH

## 2023-09-27 NOTE — Progress Notes (Signed)
 Established patient visit   Patient: Danielle Fox   DOB: 1950/04/18   73 y.o. Female  MRN: [id number] Visit Date: 09/27/2023  Today's healthcare provider: Manuelita Flatness, PA-C   Chief Complaint  Patient presents with   Cough   Fever    Been taking aspirin /tylenol , chlortabs   Subjective    Pt with pmh of copd reports cough with increased yellow mucous production, nasal congestion, sore throat, fatigue, body aches, temp of 99 at home, taking aspirin , tylenol  otc.   She has not needed her albuterol  inhaler more often, and is consistent with the symbicort  bid.  Medications: Outpatient Medications Prior to Visit  Medication Sig   albuterol  (VENTOLIN  HFA) 108 (90 Base) MCG/ACT inhaler Inhale 1-2 puffs into the lungs every 6 (six) hours as needed for wheezing or shortness of breath.   budesonide -formoterol  (SYMBICORT ) 160-4.5 MCG/ACT inhaler INHALE 2 PUFFS INTO THE LUNGS TWICE DAILY IN THE MORNING AND AT BEDTIME   cetirizine  (ZYRTEC  ALLERGY) 10 MG tablet Take 1 tablet (10 mg total) by mouth daily.   esomeprazole  (NEXIUM ) 40 MG capsule TAKE 1 CAPSULE (40 MG TOTAL) BY MOUTH 2 (TWO) TIMES DAILY BEFORE A MEAL.   Multiple Vitamins-Minerals (MULTIVITAMIN WITH MINERALS) tablet Take 1 tablet by mouth daily.   No facility-administered medications prior to visit.    Review of Systems  Constitutional:  Positive for fatigue. Negative for fever.  HENT:  Positive for congestion and sore throat.   Respiratory:  Positive for cough and wheezing. Negative for shortness of breath.   Cardiovascular:  Negative for chest pain and leg swelling.  Gastrointestinal:  Negative for abdominal pain.  Neurological:  Negative for dizziness and headaches.       Objective    BP 117/65   Pulse 98   Temp 97.9 F (36.6 C)   Ht 5' 1 (1.549 m)   Wt 122 lb 6.4 oz (55.5 kg)   SpO2 96%   BMI 23.13 kg/m    Physical Exam Constitutional:      General: She is awake.     Appearance: She is  well-developed.  HENT:     Head: Normocephalic.     Nose: Congestion and rhinorrhea present.     Mouth/Throat:     Pharynx: Posterior oropharyngeal erythema present. No oropharyngeal exudate.  Eyes:     Conjunctiva/sclera: Conjunctivae normal.  Cardiovascular:     Rate and Rhythm: Normal rate and regular rhythm.     Heart sounds: Normal heart sounds.  Pulmonary:     Effort: Pulmonary effort is normal.     Breath sounds: Normal breath sounds. No wheezing, rhonchi or rales.  Skin:    General: Skin is warm.  Neurological:     Mental Status: She is alert and oriented to person, place, and time.  Psychiatric:        Attention and Perception: Attention normal.        Mood and Affect: Mood normal.        Speech: Speech normal.        Behavior: Behavior is cooperative.     Results for orders placed or performed in visit on 09/27/23  POC COVID-19 BinaxNow  Result Value Ref Range   SARS Coronavirus 2 Ag Negative Negative    Assessment & Plan    COPD exacerbation (HCC) -     Azithromycin ; Take 2 tablets on day 1, then 1 tablet daily on days 2 through 5  Dispense: 6 tablet; Refill: 0  Acute cough -     POC COVID-19 BinaxNow  Poc covid neg   No sob, wheezing, lungs clear does not need steroids today. If any chest tightness/wheezing/sob recommend using albuterol  inhaler q 6 hr prn   Rx zpack  To node, she just had a zpack/prednisone  early June, if symptoms occur again in short term would recommend f/b with pulm   Return if symptoms worsen or fail to improve.       Manuelita Flatness, PA-C  [facility] [phone] (phone) [phone] (fax)  [facility]

## 2023-10-19 ENCOUNTER — Ambulatory Visit (INDEPENDENT_AMBULATORY_CARE_PROVIDER_SITE_OTHER)

## 2023-10-19 ENCOUNTER — Ambulatory Visit

## 2023-10-19 VITALS — BP 117/65 | Ht 61.0 in | Wt 118.0 lb

## 2023-10-19 DIAGNOSIS — Z0000 Encounter for general adult medical examination without abnormal findings: Secondary | ICD-10-CM | POA: Diagnosis not present

## 2023-10-19 NOTE — Patient Instructions (Signed)
 Danielle Fox , Thank you for taking time out of your busy schedule to complete your Annual Wellness Visit with me. I enjoyed our conversation and look forward to speaking with you again next year. I, as well as your care team,  appreciate your ongoing commitment to your health goals. Please review the following plan we discussed and let me know if I can assist you in the future. Your Game plan/ To Do List    Referrals: If you haven't heard from the office you've been referred to, please reach out to them at the phone provided.   Follow up Visits: We will see or speak with you next year for your Next Medicare AWV with our clinical staff Have you seen your provider in the last 6 months (3 months if uncontrolled diabetes)? Yes  Clinician Recommendations:  Aim for 30 minutes of exercise or brisk walking, 6-8 glasses of water, and 5 servings of fruits and vegetables each day.       This is a list of the screenings recommended for you:  Health Maintenance  Topic Date Due   Hepatitis C Screening  Never done   Pneumococcal Vaccine for age over 60 (2 of 2 - PPSV23, PCV20, or PCV21) 08/24/2016   COVID-19 Vaccine (4 - 2024-25 season) 10/31/2022   Flu Shot  09/30/2023   Screening for Lung Cancer  12/01/2023   Mammogram  04/12/2024   Medicare Annual Wellness Visit  10/18/2024   DTaP/Tdap/Td vaccine (3 - Td or Tdap) 06/30/2026   Colon Cancer Screening  11/21/2028   DEXA scan (bone density measurement)  Completed   Zoster (Shingles) Vaccine  Completed   HPV Vaccine  Aged Out   Meningitis B Vaccine  Aged Out    Advanced directives: (Copy Requested) Please bring a copy of your health care power of attorney and living will to the office to be added to your chart at your convenience. You can mail to [facility] [address] [place], [place] [address] or email to [email or web address] Advance Care Planning is important because it:  [x]  Makes sure you receive the medical care that  is consistent with your values, goals, and preferences  [x]  It provides guidance to your family and loved ones and reduces their decisional burden about whether or not they are making the right decisions based on your wishes.  Follow the link provided in your after visit summary or read over the paperwork we have mailed to you to help you started getting your Advance Directives in place. If you need assistance in completing these, please reach out to us  so that we can help you!  See attachments for Preventive Care and Fall Prevention Tips.

## 2023-10-19 NOTE — Progress Notes (Signed)
 Because this visit was a virtual/telehealth visit,  certain criteria was not obtained, such a blood pressure, CBG if applicable, and timed get up and go. Any medications not marked as taking were not mentioned during the medication reconciliation part of the visit. Any vitals not documented were not able to be obtained due to this being a telehealth visit or patient was unable to self-report a recent blood pressure reading due to a lack of equipment at home via telehealth. Vitals that have been documented are verbally provided by the patient.  This visit was performed by a medical professional under my direct supervision. I was immediately available for consultation/collaboration. I have reviewed and agree with the Annual Wellness Visit documentation.  Subjective:   Danielle Fox is a 73 y.o. who presents for a Medicare Wellness preventive visit.  As a reminder, Annual Wellness Visits don't include a physical exam, and some assessments may be limited, especially if this visit is performed virtually. We may recommend an in-person follow-up visit with your provider if needed.  Visit Complete: Virtual I connected with  Danielle Fox on 10/19/23 by a audio enabled telemedicine application and verified that I am speaking with the correct person using two identifiers.  Patient Location: Home  Provider Location: Home Office  I discussed the limitations of evaluation and management by telemedicine. The patient expressed understanding and agreed to proceed.  Vital Signs: Because this visit was a virtual/telehealth visit, some criteria may be missing or patient reported. Any vitals not documented were not able to be obtained and vitals that have been documented are patient reported.  VideoDeclined- This patient declined Librarian, academic. Therefore the visit was completed with audio only.  Persons Participating in Visit: Patient.  AWV Questionnaire: Yes: Patient  Medicare AWV questionnaire was completed by the patient on 10/19/2023; I have confirmed that all information answered by patient is correct and no changes since this date.  Cardiac Risk Factors include: advanced age (>72men, >47 women);Other (see comment);dyslipidemia, Risk factor comments: copd     Objective:    Today's Vitals   10/19/23 1530  BP: 117/65  Weight: 118 lb (53.5 kg)  Height: 5' 1 (1.549 m)   Body mass index is 22.3 kg/m.     10/19/2023    3:29 PM 08/27/2022    1:02 PM 07/16/2021   11:22 AM 02/25/2021    1:28 PM 07/10/2020   10:59 AM 10/08/2019    2:20 PM 10/08/2019    5:42 AM  Advanced Directives  Does Patient Have a Medical Advance Directive? Yes Yes Yes No Yes Yes Yes  Type of Estate agent of [place];Living will Healthcare Power of [place];Living will Healthcare Power of [place];Out of facility DNR (pink MOST or yellow form);Living will Living will Healthcare Power of [place];Living will Healthcare Power of [place];Living will Healthcare Power of [place];Living will  Does patient want to make changes to medical advance directive? No - Patient declined   No - Patient declined  No - Patient declined No - Patient declined  Copy of Healthcare Power of Attorney in Chart? No - copy requested No - copy requested No - copy requested   No - copy requested No - copy requested  Would patient like information on creating a medical advance directive?    No - Patient declined  No - Patient declined No - Patient declined    Current Medications (verified) Outpatient Encounter Medications as of 10/19/2023  Medication Sig   albuterol  (VENTOLIN  HFA)  108 (90 Base) MCG/ACT inhaler Inhale 1-2 puffs into the lungs every 6 (six) hours as needed for wheezing or shortness of breath.   budesonide -formoterol  (SYMBICORT ) 160-4.5 MCG/ACT inhaler INHALE 2 PUFFS INTO THE LUNGS TWICE DAILY IN THE MORNING AND AT BEDTIME   cetirizine  (ZYRTEC  ALLERGY) 10 MG tablet Take 1 tablet  (10 mg total) by mouth daily.   esomeprazole  (NEXIUM ) 40 MG capsule TAKE 1 CAPSULE (40 MG TOTAL) BY MOUTH 2 (TWO) TIMES DAILY BEFORE A MEAL.   Multiple Vitamins-Minerals (MULTIVITAMIN WITH MINERALS) tablet Take 1 tablet by mouth daily.   No facility-administered encounter medications on file as of 10/19/2023.    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   Preventative health care 06/29/2016   Pruritus 06/29/2016   Pyloric stenosis in adult 05/2019   Welcome to Medicare preventive visit 06/29/2016   Past Surgical History:  Procedure Laterality Date   ABDOMINAL HYSTERECTOMY     APPENDECTOMY     COLONOSCOPY     01-14-2005   DILATION AND CURETTAGE OF UTERUS     FINE NEEDLE ASPIRATION  02/27/2021   Procedure: FINE NEEDLE ASPIRATION (FNA) LINEAR;  Surgeon: Brenna Adine CROME, DO;  Location: [facility];  Service: Pulmonary;;   SINUS IRRIGATION  04/02/2015   TUBAL LIGATION     UPPER GASTROINTESTINAL ENDOSCOPY  11/22/2018   VIDEO BRONCHOSCOPY WITH ENDOBRONCHIAL ULTRASOUND Bilateral 02/27/2021   Procedure: VIDEO BRONCHOSCOPY WITH ENDOBRONCHIAL ULTRASOUND;  Surgeon: Brenna Adine CROME, DO;  Location: [facility];  Service: Pulmonary;  Laterality: Bilateral;   Family History  Problem Relation Age of Onset   Heart disease Mother        cabg, MI   Alcohol abuse Father    Cirrhosis Father    Heart disease Father        MI   Colon cancer Neg Hx    Colon polyps Neg Hx    Esophageal cancer Neg Hx    Stomach cancer Neg Hx    Rectal cancer Neg Hx    Social History   Socioeconomic History   Marital status: Married    Spouse name: Not on file   Number of children: 3    Years of education: Not on file   Highest education level: Associate degree: occupational, Scientist, product/process development, or vocational program  Occupational History   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[employer], retired    Associate Professor: Print production planner   Occupation: retired  Tobacco Use   Smoking status: Former    Current packs/day: 0.00    Average packs/day: 1 pack/day for 48.0 years (48.0 ttl pk-yrs)    Types: Cigarettes    Start date: 02/19/1966    Quit date: 02/19/2014    Years since quitting: 9.6   Smokeless tobacco: Never   Tobacco comments:    Quit and has no desire to smoke again,  Vaping Use   Vaping status: Never Used  Substance and Sexual Activity   Alcohol use: Yes    Alcohol/week: 0.0 standard drinks of alcohol    Comment: socially   Drug use: No   Sexual activity: Yes    Partners: Male  Other Topics Concern   Not on file  Social History  Narrative   Exercise-- walks dog on occasion   [place] Handed   One Story Home   Does not drink caffeine    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   Social Drivers of Corporate investment banker Strain: Low Risk  (10/19/2023)   Overall Financial Resource Strain (CARDIA)    Difficulty of Paying Living Expenses: Not hard at all  Food Insecurity: No Food Insecurity (10/19/2023)   Hunger Vital Sign    Worried About Running Out of Food in the Last Year: Never true    Ran Out of Food in the Last Year: Never true  Transportation Needs: No Transportation Needs (10/19/2023)   PRAPARE - Administrator, Civil Service (Medical): No    Lack of Transportation (Non-Medical): No  Physical Activity: Insufficiently Active (10/19/2023)   Exercise Vital Sign    Days of Exercise per Week: 3 days    Minutes of Exercise per Session: 20 min  Stress: No Stress Concern Present (10/19/2023)   [employer] of Occupational Health - Occupational Stress Questionnaire    Feeling of Stress: Not at all  Social Connections: Socially Integrated (10/19/2023)   Social  Connection and Isolation Panel    Frequency of Communication with Friends and Family: More than three times a week    Frequency of Social Gatherings with Friends and Family: Three times a week    Attends Religious Services: More than 4 times per year    Active Member of Clubs or Organizations: Yes    Attends Engineer, structural: More than 4 times per year    Marital Status: Married    Tobacco Counseling Counseling given: Not Answered Tobacco comments: Quit and has no desire to smoke again,    Clinical Intake:  Pre-visit preparation completed: Yes  Pain : No/denies pain     BMI - recorded: 22.3 Nutritional Status: BMI of 19-24  Normal Nutritional Risks: None Diabetes: No  Lab Results  Component Value Date   HGBA1C 5.8 12/02/2020   HGBA1C 5.9 (H) 09/28/2019     How often do you need to have someone help you when you read instructions, pamphlets, or other written materials from your doctor or pharmacy?: 1 - Never  Interpreter Needed?: No  Information entered by :: Danielle Fox,CMA   Activities of Daily Living     10/19/2023    2:22 PM  In your present state of health, do you have any difficulty performing the following activities:  Hearing? 0  Vision? 0  Difficulty concentrating or making decisions? 0  Walking or climbing stairs? 0  Dressing or bathing? 0  Doing errands, shopping? 0  Preparing Food and eating ? N  Using the Toilet? N  In the past six months, have you accidently leaked urine? N  Do you have problems with loss of bowel control? N  Managing your Medications? N  Managing your Finances? N  Housekeeping or managing your Housekeeping? N    Patient Care Team: Domenica Harlene LABOR, MD as PCP - General (Family Medicine) Geronimo Amel, MD as Consulting Physician (Pulmonary Disease)  I have updated your Care Teams any recent Medical Services you may have received from other providers in the past year.     Assessment:   This is a  routine wellness examination for Danielle Fox.  Hearing/Vision screen Hearing Screening - Comments:: No difficulties Vision Screening - Comments:: No difficulties    Goals Addressed  This Visit's Progress    Patient Stated   On track    Maintain healthy active lifestyle.        Depression Screen     10/19/2023    3:33 PM 09/07/2022   11:11 AM 08/27/2022    1:01 PM 03/09/2022   12:55 PM 06/09/2021    9:56 AM 07/10/2020   11:00 AM 05/13/2020   11:02 AM  PHQ 2/9 Scores  PHQ - 2 Score 0 0 0 0 0 0 0  PHQ- 9 Score 0          Fall Risk     10/19/2023    2:22 PM 09/07/2022   11:11 AM 08/20/2022    9:03 AM 07/16/2021   11:22 AM 06/09/2021    9:53 AM  Fall Risk   Falls in the past year? 0 0 0 0 0  Number falls in past yr: 0 0 0 0 0  Injury with Fall? 0 0 0 0 0  Risk for fall due to : No Fall Risks  No Fall Risks No Fall Risks No Fall Risks  Follow up Falls evaluation completed Falls evaluation completed Falls evaluation completed Falls evaluation completed  Falls evaluation completed      Data saved with a previous flowsheet row definition    MEDICARE RISK AT HOME:  Medicare Risk at Home Any stairs in or around the home?: (Patient-Rptd) No If so, are there any without handrails?: (Patient-Rptd) No Home free of loose throw rugs in walkways, pet beds, electrical cords, etc?: (Patient-Rptd) Yes Adequate lighting in your home to reduce risk of falls?: (Patient-Rptd) Yes Life alert?: (Patient-Rptd) No Use of a cane, walker or w/c?: (Patient-Rptd) No Grab bars in the bathroom?: (Patient-Rptd) No Shower chair or bench in shower?: (Patient-Rptd) No Elevated toilet seat or a handicapped toilet?: (Patient-Rptd) No  TIMED UP AND GO:  Was the test performed?  No  Cognitive Function: 6CIT completed        10/19/2023    3:33 PM 08/27/2022    1:03 PM  6CIT Screen  What Year? 0 points 0 points  What month? 0 points 0 points  What time? 0 points 0 points  Count back from 20  0 points 0 points  Months in reverse 0 points 0 points  Repeat phrase 0 points 2 points  Total Score 0 points 2 points    Immunizations Immunization History  Administered Date(s) Administered   Fluad Quad(high Dose 65+) 01/29/2019, 12/28/2019, 12/02/2020, 01/13/2022   Influenza Split 01/07/2011   Influenza Whole 11/29/2009   Influenza, High Dose Seasonal PF 11/07/2017   Influenza,inj,Quad PF,6+ Mos 12/14/2012   Influenza-Unspecified 12/28/2013, 12/01/2015, 12/25/2016, 12/30/2021   PFIZER(Purple Top)SARS-COV-2 Vaccination 05/06/2019, 06/06/2019, 12/15/2019   Pneumococcal Conjugate-13 06/29/2016   Respiratory Syncytial Virus Vaccine,Recomb Aduvanted(Arexvy) 03/10/2022   Tdap 03/01/2001, 06/29/2016   Zoster Recombinant(Shingrix) 11/30/2017, 03/07/2018    Screening Tests Health Maintenance  Topic Date Due   Hepatitis C Screening  Never done   Pneumococcal Vaccine: 50+ Years (2 of 2 - PPSV23, PCV20, or PCV21) 08/24/2016   COVID-19 Vaccine (4 - 2024-25 season) 10/31/2022   INFLUENZA VACCINE  09/30/2023   Lung Cancer Screening  12/01/2023   MAMMOGRAM  04/12/2024   Medicare Annual Wellness (AWV)  10/18/2024   DTaP/Tdap/Td (3 - Td or Tdap) 06/30/2026   Colonoscopy  11/21/2028   DEXA SCAN  Completed   Zoster Vaccines- Shingrix  Completed   HPV VACCINES  Aged Out   Meningococcal B Vaccine  Aged  Out    Health Maintenance  Health Maintenance Due  Topic Date Due   Hepatitis C Screening  Never done   Pneumococcal Vaccine: 50+ Years (2 of 2 - PPSV23, PCV20, or PCV21) 08/24/2016   COVID-19 Vaccine (4 - 2024-25 season) 10/31/2022   INFLUENZA VACCINE  09/30/2023   Health Maintenance Items Addressed:patient declined   Additional Screening:  Vision Screening: Recommended annual ophthalmology exams for early detection of glaucoma and other disorders of the eye. Would you like a referral to an eye doctor? No    Dental Screening: Recommended annual dental exams for proper oral  hygiene  Community Resource Referral / Chronic Care Management: CRR required this visit?  No   CCM required this visit?  No   Plan:    I have personally reviewed and noted the following in the patient's chart:   Medical and social history Use of alcohol, tobacco or illicit drugs  Current medications and supplements including opioid prescriptions. Patient is not currently taking opioid prescriptions. Functional ability and status Nutritional status Physical activity Advanced directives List of other physicians Hospitalizations, surgeries, and ER visits in previous 12 months Vitals Screenings to include cognitive, depression, and falls Referrals and appointments  In addition, I have reviewed and discussed with patient certain preventive protocols, quality metrics, and best practice recommendations. A written personalized care plan for preventive services as well as general preventive health recommendations were provided to patient.   Danielle Fox, [place]   10/19/2023   After Visit Summary: (MyChart) Due to this being a telephonic visit, the after visit summary with patients personalized plan was offered to patient via MyChart   Notes: Nothing significant to report at this time.

## 2023-11-01 ENCOUNTER — Ambulatory Visit (INDEPENDENT_AMBULATORY_CARE_PROVIDER_SITE_OTHER): Admitting: Podiatry

## 2023-11-01 ENCOUNTER — Ambulatory Visit (INDEPENDENT_AMBULATORY_CARE_PROVIDER_SITE_OTHER)

## 2023-11-01 DIAGNOSIS — M722 Plantar fascial fibromatosis: Secondary | ICD-10-CM

## 2023-11-01 MED ORDER — MELOXICAM 7.5 MG PO TABS: 7.5000 mg | ORAL_TABLET | Freq: Every day | ORAL | 1 refills | Status: DC | PRN

## 2023-11-01 MED ORDER — TRIAMCINOLONE ACETONIDE 10 MG/ML IJ SUSP
5.0000 mg | Freq: Once | INTRAMUSCULAR | Status: AC
Start: 2023-11-01 — End: 2023-11-01
  Administered 2023-11-01: 5 mg via INTRAMUSCULAR

## 2023-11-01 NOTE — Patient Instructions (Signed)
 While at your visit today you received a steroid injection in your foot or ankle to help with your pain. Along with having the steroid medication there is some numbing medication in the shot that you received. Due to this you may notice some numbness to the area for the next couple of hours.   I would recommend limiting activity for the next few days to help the steroid injection take affect.    The actually benefit from the steroid injection may take up to 2-7 days to see a difference. You may actually experience a small (as in 10%) INCREASE in pain in the first 24 hours---that is common. It would be best if you can ice the area today and take anti-inflammatory medications (such as Ibuprofen, Motrin, or Aleve) if you are able to take these medications. If you were prescribed another medication to help with the pain go ahead and start that medication today    Things to watch out for that you should contact us  or a health care provider urgently would include: 1. Unusual (as in more than 10%) increase in pain 2. New fever > 101.5 3. New swelling or redness of the injected area.  4. Streaking of red lines around the area injected.  If you have any questions or concerns about this, please give our office a call at [phone].    --  Plantar Fasciitis (Heel Spur Syndrome) with Rehab The plantar fascia is a fibrous, ligament-like, soft-tissue structure that spans the bottom of the foot. Plantar fasciitis is a condition that causes pain in the foot due to inflammation of the tissue. SYMPTOMS  Pain and tenderness on the underneath side of the foot. Pain that worsens with standing or walking. CAUSES  Plantar fasciitis is caused by irritation and injury to the plantar fascia on the underneath side of the foot. Common mechanisms of injury include: Direct trauma to bottom of the foot. Damage to a small nerve that runs under the foot where the main fascia attaches to the heel bone. Stress placed on  the plantar fascia due to bone spurs. RISK INCREASES WITH:  Activities that place stress on the plantar fascia (running, jumping, pivoting, or cutting). Poor strength and flexibility. Improperly fitted shoes. Tight calf muscles. Flat feet. Failure to warm-up properly before activity. Obesity. PREVENTION Warm up and stretch properly before activity. Allow for adequate recovery between workouts. Maintain physical fitness: Strength, flexibility, and endurance. Cardiovascular fitness. Maintain a health body weight. Avoid stress on the plantar fascia. Wear properly fitted shoes, including arch supports for individuals who have flat feet.  PROGNOSIS  If treated properly, then the symptoms of plantar fasciitis usually resolve without surgery. However, occasionally surgery is necessary.  RELATED COMPLICATIONS  Recurrent symptoms that may result in a chronic condition. Problems of the lower back that are caused by compensating for the injury, such as limping. Pain or weakness of the foot during push-off following surgery. Chronic inflammation, scarring, and partial or complete fascia tear, occurring more often from repeated injections.  TREATMENT  Treatment initially involves the use of ice and medication to help reduce pain and inflammation. The use of strengthening and stretching exercises may help reduce pain with activity, especially stretches of the Achilles tendon. These exercises may be performed at home or with a therapist. Your caregiver may recommend that you use heel cups of arch supports to help reduce stress on the plantar fascia. Occasionally, corticosteroid injections are given to reduce inflammation. If symptoms persist for greater than 6  months despite non-surgical (conservative), then surgery may be recommended.   MEDICATION  If pain medication is necessary, then nonsteroidal anti-inflammatory medications, such as aspirin  and ibuprofen, or other minor pain relievers, such as  acetaminophen , are often recommended. Do not take pain medication within 7 days before surgery. Prescription pain relievers may be given if deemed necessary by your caregiver. Use only as directed and only as much as you need. Corticosteroid injections may be given by your caregiver. These injections should be reserved for the most serious cases, because they may only be given a certain number of times.  HEAT AND COLD Cold treatment (icing) relieves pain and reduces inflammation. Cold treatment should be applied for 10 to 15 minutes every 2 to 3 hours for inflammation and pain and immediately after any activity that aggravates your symptoms. Use ice packs or massage the area with a piece of ice (ice massage). Heat treatment may be used prior to performing the stretching and strengthening activities prescribed by your caregiver, physical therapist, or athletic trainer. Use a heat pack or soak the injury in warm water.  SEEK IMMEDIATE MEDICAL CARE IF: Treatment seems to offer no benefit, or the condition worsens. Any medications produce adverse side effects.  EXERCISES- RANGE OF MOTION (ROM) AND STRETCHING EXERCISES - Plantar Fasciitis (Heel Spur Syndrome) These exercises may help you when beginning to rehabilitate your injury. Your symptoms may resolve with or without further involvement from your physician, physical therapist or athletic trainer. While completing these exercises, remember:  Restoring tissue flexibility helps normal motion to return to the joints. This allows healthier, less painful movement and activity. An effective stretch should be held for at least 30 seconds. A stretch should never be painful. You should only feel a gentle lengthening or release in the stretched tissue.  RANGE OF MOTION - Toe Extension, Flexion Sit with your right / left leg crossed over your opposite knee. Grasp your toes and gently pull them back toward the top of your foot. You should feel a stretch on  the bottom of your toes and/or foot. Hold this stretch for 10 seconds. Now, gently pull your toes toward the bottom of your foot. You should feel a stretch on the top of your toes and or foot. Hold this stretch for 10 seconds. Repeat  times. Complete this stretch 3 times per day.   RANGE OF MOTION - Ankle Dorsiflexion, Active Assisted Remove shoes and sit on a chair that is preferably not on a carpeted surface. Place right / left foot under knee. Extend your opposite leg for support. Keeping your heel down, slide your right / left foot back toward the chair until you feel a stretch at your ankle or calf. If you do not feel a stretch, slide your bottom forward to the edge of the chair, while still keeping your heel down. Hold this stretch for 10 seconds. Repeat 3 times. Complete this stretch 2 times per day.   STRETCH  Gastroc, Standing Place hands on wall. Extend right / left leg, keeping the front knee somewhat bent. Slightly point your toes inward on your back foot. Keeping your right / left heel on the floor and your knee straight, shift your weight toward the wall, not allowing your back to arch. You should feel a gentle stretch in the right / left calf. Hold this position for 10 seconds. Repeat 3 times. Complete this stretch 2 times per day.  STRETCH  Soleus, Standing Place hands on wall. Extend right / left  leg, keeping the other knee somewhat bent. Slightly point your toes inward on your back foot. Keep your right / left heel on the floor, bend your back knee, and slightly shift your weight over the back leg so that you feel a gentle stretch deep in your back calf. Hold this position for 10 seconds. Repeat 3 times. Complete this stretch 2 times per day.  STRETCH  Gastrocsoleus, Standing  Note: This exercise can place a lot of stress on your foot and ankle. Please complete this exercise only if specifically instructed by your caregiver.  Place the ball of your right / left foot  on a step, keeping your other foot firmly on the same step. Hold on to the wall or a rail for balance. Slowly lift your other foot, allowing your body weight to press your heel down over the edge of the step. You should feel a stretch in your right / left calf. Hold this position for 10 seconds. Repeat this exercise with a slight bend in your right / left knee. Repeat 3 times. Complete this stretch 2 times per day.   STRENGTHENING EXERCISES - Plantar Fasciitis (Heel Spur Syndrome)  These exercises may help you when beginning to rehabilitate your injury. They may resolve your symptoms with or without further involvement from your physician, physical therapist or athletic trainer. While completing these exercises, remember:  Muscles can gain both the endurance and the strength needed for everyday activities through controlled exercises. Complete these exercises as instructed by your physician, physical therapist or athletic trainer. Progress the resistance and repetitions only as guided.  STRENGTH - Towel Curls Sit in a chair positioned on a non-carpeted surface. Place your foot on a towel, keeping your heel on the floor. Pull the towel toward your heel by only curling your toes. Keep your heel on the floor. Repeat 3 times. Complete this exercise 2 times per day.  STRENGTH - Ankle Inversion Secure one end of a rubber exercise band/tubing to a fixed object (table, pole). Loop the other end around your foot just before your toes. Place your fists between your knees. This will focus your strengthening at your ankle. Slowly, pull your big toe up and in, making sure the band/tubing is positioned to resist the entire motion. Hold this position for 10 seconds. Have your muscles resist the band/tubing as it slowly pulls your foot back to the starting position. Repeat 3 times. Complete this exercises 2 times per day.  Document Released: 02/15/2005 Document Revised: 05/10/2011 Document Reviewed:  05/30/2008 [facility] Patient Information 2014 ExitCare, [place]. --  Meloxicam  Tablets What is this medication? MELOXICAM  (mel OX i cam) treats mild to moderate pain, inflammation, or arthritis. It works by decreasing inflammation. It belongs to a group of medications called NSAIDs. This medicine may be used for other purposes; ask your health care provider or pharmacist if you have questions. COMMON BRAND NAME(S): Mobic  What should I tell my care team before I take this medication? They need to know if you have any of these conditions: Asthma Bleeding problems Dehydration Frequently drink alcohol Have had a heart attack, stroke, or mini-stroke Heart bypass surgery, or CABG, within the past 2 weeks Heart or blood vessel conditions Heart failure High blood pressure Kidney disease Liver disease Stomach bleeding Stomach ulcers, other stomach or intestine problems Tobacco use An unusual or allergic reaction to meloxicam , other medications, foods, dyes, or preservatives Pregnant or trying to get pregnant Breastfeeding How should I use this medication? Take this medication by  mouth. Take it as directed on the prescription label at the same time every day. You can take it with or without food. If it upsets your stomach, take it with food. Do not use it more often than directed. There may be unused or extra doses in the bottle after you finish your treatment. Talk to your care team if you have questions about your dose. A special MedGuide will be given to you by the pharmacist with each prescription and refill. Be sure to read this information carefully each time. Talk to your care team about the use of this medication in children. Special care may be needed. People over 32 years of age may have a stronger reaction and need a smaller dose. Overdosage: If you think you have taken too much of this medicine contact a poison control center or emergency room at once. NOTE: This medicine is only for  you. Do not share this medicine with others. What if I miss a dose? If you miss a dose, take it as soon as you can. If it is almost time for your next dose, take only that dose. Do not take double or extra doses. What may interact with this medication? Do not take this medication with any of the following: Cidofovir Ketorolac This medication may also interact with the following: Alcohol Aspirin  and aspirin -like medications Blood thinners Cyclosporine Digoxin Diuretics Lithium Medications for high blood pressure Methotrexate Other NSAIDs, medications for pain and inflammation, such as ibuprofen or naproxen Some medications for depression Steroid medications, such as prednisone  or cortisone Supplements, such as garlic, ginger, ginkgo, methylsulfonylmethane (MSM) This list may not describe all possible interactions. Give your health care provider a list of all the medicines, herbs, non-prescription drugs, or dietary supplements you use. Also tell them if you smoke, drink alcohol, or use illegal drugs. Some items may interact with your medicine. What should I watch for while using this medication? Visit your care team for regular checks on your progress. Tell your care team if your symptoms do not start to get better or if they get worse. Do not take aspirin  or other NSAIDs, such as ibuprofen or naproxen, while you are taking this medication. Side effects, such as upset stomach, nausea, and ulcers, may be more likely to occur. Many over-the-counter medications contain aspirin , ibuprofen, or naproxen. It is important to read labels carefully. Talk to your care team about all the medications you take. They can tell you what is safe to take together. This medication can cause serious bleeding, ulcers, or tears in the stomach. These problems can occur at any time and with no warning signs. They are more common with long-term use. Talk to your care team right away if you have stomach pain, bloody or  black, tar-like stools, or vomit blood that is red or looks like coffee grounds. This medication increases the risk of blood clots, heart attack, and stroke. These events can occur at any time. They are more common with long-term use and in those who have heart disease. If you take aspirin  to prevent a heart attack or stroke, talk to your care team. They can help you find an option that works for you. This medication may cause serious skin reactions. They can happen weeks to months after starting the medication. Talk to your care team right away if you have fevers or flu-like symptoms with a rash. The rash may be red or purple and then turn into blisters or peeling of the skin. Or you  might notice a red rash with swelling of the face, lips, or lymph nodes in your neck or under your arms. Talk to your care team if you may be pregnant. Taking this medication after 20 weeks of pregnancy may cause serious birth defects. Use of this medication after 30 weeks of pregnancy is not recommended. This medication may cause infertility. It is usually temporary. Talk to your care team if you are concerned about your fertility. What side effects may I notice from receiving this medication? Side effects that you should report to your care team as soon as possible: Allergic reactions--skin rash, itching, hives, swelling of the face, lips, tongue, or throat Bleeding--bloody or black, tar-like stools, vomiting blood or brown material that looks like coffee grounds, red or dark brown urine, small red or purple spots on skin, unusual bruising or bleeding Heart attack--pain or tightness in the chest, shoulders, arms, or jaw, nausea, shortness of breath, cold or clammy skin, feeling faint or lightheaded Heart failure--shortness of breath, swelling of the ankles, feet, or hands, sudden weight gain, unusual weakness or fatigue Increase in blood pressure Kidney injury--decrease in the amount of urine, swelling of the ankles,  hands, or feet Liver injury--right upper belly pain, loss of appetite, nausea, light-colored stool, dark yellow or brown urine, yellowing skin or eyes, unusual weakness or fatigue Rash, fever, and swollen lymph nodes Redness, blistering, peeling, or loosening of the skin, including inside the mouth Round red or dark patches on the skin that may itch, burn, and blister Stroke--sudden numbness or weakness of the face, arm, or leg, trouble speaking, confusion, trouble walking, loss of balance or coordination, dizziness, severe headache, change in vision Side effects that usually do not require medical attention (report these to your care team if they continue or are bothersome): Headache Loss of appetite Nausea Upset stomach This list may not describe all possible side effects. Call your doctor for medical advice about side effects. You may report side effects to FDA at 1-800-FDA-1088. Where should I keep my medication? Keep out of the reach of children and pets. Store at room temperature between 20 and 25 degrees C (68 and 77 degrees F). Protect from moisture. Keep the container tightly closed. Get rid of any unused medication after the expiration date. To get rid of medications that are no longer needed or have expired: Take the medication to a medication take-back program. Check with your pharmacy or law enforcement to find a location. If you cannot return the medication, check the label or package insert to see if the medication should be thrown out in the garbage or flushed down the toilet. If you are not sure, ask your care team. If it is safe to put it in the trash, empty the medication out of the container. Mix the medication with cat litter, dirt, coffee grounds, or other unwanted substance. Seal the mixture in a bag or container. Put it in the trash. NOTE: This sheet is a summary. It may not cover all possible information. If you have questions about this medicine, talk to your doctor,  pharmacist, or health care provider.  2025 Elsevier/Gold Standard (2023-04-26 00:00:00)

## 2023-11-02 NOTE — Progress Notes (Signed)
 Subjective:  Patient ID: Danielle Fox, female    DOB: 10/13/1950,  MRN: [id number]  Chief Complaint  Patient presents with   Plantar Fasciitis    Left heel very Painful to the touch and if any pressure is applied very hard to walk currently walking with a walker (6 days) has plantar fascitis    Discussed the use of AI scribe software for clinical note transcription with the patient, who gave verbal consent to proceed.  History of Present Illness Danielle Fox is a 73 year old female who presents with heel pain.  Her heel pain began suddenly on Wednesday without any preceding injury. The pain is excruciating, located at the bottom of the heel, and worsens with dorsiflexion, describing a pulling sensation. Swelling is present in the area.  She has tried ibuprofen, naproxen, and aspirin  without relief. She uses a walker for mobility. The pain significantly impacts her daily activities.  She previously used a plantar fascial brace but found it uncomfortable. She typically wears flat shoes. No pain is present in the Achilles tendon or other parts of the foot.  She states it feels like it did previously when I saw her in 2024.  She is hoping for an injection today.      Objective:    Physical Exam General: AAO x3, NAD  Dermatological: Skin is warm, dry and supple bilateral.  There are no open sores, no preulcerative lesions, no rash or signs of infection present.  Vascular: Dorsalis Pedis artery and Posterior Tibial artery pedal pulses are 2/4 bilateral with immedate capillary fill time. . There is no pain with calf compression, swelling, warmth, erythema.   Neruologic: Grossly intact via light touch bilateral.  No Tinel's sign.  Musculoskeletal: There is tenderness palpation along plantar aspect of calcaneus on insertion of the plantar fascia as well as the arch of the foot.  There is no pain with lateral compression of calcaneus.  No pain to the posterior  calcaneus.  No pain in the course of insertion of the Achilles tendon.  Flexor, extensor tendons intact.  Mild edema present.  No erythema.  Gait: Unassisted, Nonantalgic.     No images are attached to the encounter.    Results RADIOLOGY Left foot x-ray: Multiple views obtained.  Calcaneal spur present, osseous structures appear normal.  No evidence of acute fracture.   Assessment:   1. Plantar fasciitis of left foot      Plan:  Patient was evaluated and treated and all questions answered.  Assessment and Plan Assessment & Plan Plantar fasciitis with heel spur Chronic plantar fasciitis with heel spur, acute pain exacerbation. X-ray confirmed heel spur. Previous NSAIDs ineffective. Clinical findings consistent with plantar fasciitis. - Steroid injection discussed today.  Verbal consent obtained.  Will place into cam boot postinjection until symptoms resolved. - Prescribed meloxicam  for inflammation. - Instructed to ice heel daily. - Recommended shoes with arch support. - Provided night splint for foot stretch. - Advised walking boot for immobilization if needed. - Consider oral steroids if no improvement in 1-2 weeks.   Procedure: Injection Tendon/Ligament Discussed alternatives, risks, complications and verbal consent was obtained.  Location: Left plantar fascia at the glabrous junction; medial approach. Skin Prep: Alcohol. Injectate: 0.5cc 0.5% marcaine plain, 0.5 cc 2% lidocaine  plain and, 1 cc kenalog  10. Disposition: Patient tolerated procedure well. Injection site dressed with a band-aid.  Post-injection care was discussed and return precautions discussed.    Return in about 4 weeks (around 11/29/2023).  Donnice JONELLE Fees DPM

## 2023-11-06 ENCOUNTER — Telehealth: Payer: Self-pay | Admitting: Internal Medicine

## 2023-11-06 DIAGNOSIS — J439 Emphysema, unspecified: Secondary | ICD-10-CM

## 2023-11-06 DIAGNOSIS — Z87891 Personal history of nicotine dependence: Secondary | ICD-10-CM

## 2023-11-06 DIAGNOSIS — R9389 Abnormal findings on diagnostic imaging of other specified body structures: Secondary | ICD-10-CM

## 2023-11-06 DIAGNOSIS — J849 Interstitial pulmonary disease, unspecified: Secondary | ICD-10-CM

## 2023-11-06 NOTE — Telephone Encounter (Signed)
 Leslie/Clinical  I saw her in Sept 2024 - and ordered CT for OCt 2024 but in between she had ER visit for pneumonia (she has GI aspiration issues as well). So the Oct 2024 CT last year showed pneumonia. She then went to [place]  afte acute visit with [facility].  She needs a followup CT - now asap and a visit but CT first ASAP   I have ordered the CT  Thanks    SIGNATURE    Dr. Dorethia Cave, M.D., F.C.C.P,  Pulmonary and Critical Care Medicine Staff Physician, [facility] Center Director - Interstitial Lung Disease  Program  Pulmonary Fibrosis [facility] at [facility] [place], [place], [address]   Pager: [phone], If no answer  -> Check AMION or Try [phone] Telephone (clinical office): [phone] Telephone (research): [phone]  7:44 AM 11/06/2023

## 2023-11-07 NOTE — Telephone Encounter (Signed)
 I called and spoke with the pt  I let her know of response from MR  She verbalized understanding  I scheduled her ov with MR for 12/30/23- needs the CT first  MR ordered this- routing to [facility] to let them know that the pt is out of town 10/8-10/28   Please set up CT prior to ov with MR but keep in mind the dates she is out of town, thanks!!

## 2023-11-09 NOTE — Telephone Encounter (Addendum)
 Patient has been scheduled for CT on 11/11/23 and is aware. NFN

## 2023-11-11 ENCOUNTER — Ambulatory Visit
Admission: RE | Admit: 2023-11-11 | Discharge: 2023-11-11 | Disposition: A | Discharge status: Home / Self Care | Source: Ambulatory Visit | Attending: Internal Medicine | Admitting: Internal Medicine

## 2023-11-11 ENCOUNTER — Other Ambulatory Visit

## 2023-11-11 DIAGNOSIS — R9389 Abnormal findings on diagnostic imaging of other specified body structures: Secondary | ICD-10-CM

## 2023-11-11 DIAGNOSIS — J439 Emphysema, unspecified: Secondary | ICD-10-CM

## 2023-11-11 DIAGNOSIS — J432 Centrilobular emphysema: Secondary | ICD-10-CM | POA: Diagnosis not present

## 2023-11-11 DIAGNOSIS — Z87891 Personal history of nicotine dependence: Secondary | ICD-10-CM

## 2023-11-11 DIAGNOSIS — J849 Interstitial pulmonary disease, unspecified: Secondary | ICD-10-CM

## 2023-11-21 ENCOUNTER — Telehealth: Payer: Self-pay

## 2023-11-21 DIAGNOSIS — Z1231 Encounter for screening mammogram for malignant neoplasm of breast: Secondary | ICD-10-CM

## 2023-11-21 DIAGNOSIS — M8580 Other specified disorders of bone density and structure, unspecified site: Secondary | ICD-10-CM

## 2023-11-21 NOTE — Telephone Encounter (Unsigned)
 Copied from CRM #[id number]. Topic: Clinical - Request for Lab/Test Order >> Nov 21, 2023  4:27 PM Taleah C wrote: Reason for CRM: pt called to request for a bone density and mammogram order to be placed. She asked to speak with a nurse regarding this. Please call and advise at [phone]

## 2023-11-22 NOTE — Telephone Encounter (Signed)
 Orders have been placed and left message on pts vm that they have been placed.

## 2023-11-26 ENCOUNTER — Other Ambulatory Visit: Payer: Self-pay | Admitting: Family Medicine

## 2023-11-29 ENCOUNTER — Ambulatory Visit: Admitting: Podiatry

## 2023-11-30 ENCOUNTER — Telehealth: Payer: Self-pay

## 2023-11-30 NOTE — Telephone Encounter (Signed)
 Copied from CRM [id number]. Topic: General - Other >> Nov 30, 2023 10:00 AM Turkey A wrote: Reason for CRM: Patient called upset that Pharmacy said they have not received the request for her inhaler. Agent informed patient that request was received on 11/26/23 and that it is being worked on and can take up to 3 business days. Patient said the pharmacy is not working on it because they do not have it.

## 2023-11-30 NOTE — Telephone Encounter (Signed)
 Copied from CRM #[id number]. Topic: Clinical - Prescription Issue >> Nov 30, 2023  1:43 PM Danielle Fox wrote: Reason for CRM: Pt is calling regarding the budesonide -formoterol  inhaler. Pt is requesting that PCP call her insurance and request a tier change so that her inhaler will be reduced from $45 to $6. Pt said the change was requested last year but she guess that it has to be done annually. Pls call pt with any questions.

## 2023-12-01 ENCOUNTER — Other Ambulatory Visit (HOSPITAL_COMMUNITY): Payer: Self-pay

## 2023-12-01 NOTE — Telephone Encounter (Unsigned)
 Copied from CRM [id number]. Topic: Clinical - Medication Prior Auth >> Dec 01, 2023 11:09 AM Donna BRAVO wrote: Reason for CRM: [facility] calling stating budesonide -formoterol  (SYMBICORT ) 160-4.5 MCG/ACT inhaler Has been approved  co pay moving from tier 3 to tier 1 Approved 12/01/23 to  11/30/24 [facility] will fax over approval letter

## 2023-12-11 ENCOUNTER — Ambulatory Visit: Payer: Self-pay | Admitting: Internal Medicine

## 2023-12-11 NOTE — Progress Notes (Signed)
  IMPRESSION: Waxing and waning pulmonary nodules and consolidations most consistent with infectious/inflammatory process. Recommend follow-up to ensure stability/resolution of the pulmonary nodules.   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   Reactive mediastinal lymphadenopathy.   Moderate emphysematous changes.   Atherosclerotic calcifications of coronary arteries.     Electronically Signed   By: Megan  Zare M.D.   On: 11/17/2023 16:40

## 2023-12-30 ENCOUNTER — Encounter: Payer: Self-pay | Admitting: Internal Medicine

## 2023-12-30 ENCOUNTER — Ambulatory Visit: Admitting: Internal Medicine

## 2023-12-30 VITALS — BP 83/60 | HR 69 | Temp 98.7°F | Ht 61.0 in | Wt 122.6 lb

## 2023-12-30 DIAGNOSIS — J439 Emphysema, unspecified: Secondary | ICD-10-CM

## 2023-12-30 DIAGNOSIS — Z87891 Personal history of nicotine dependence: Secondary | ICD-10-CM

## 2023-12-30 DIAGNOSIS — R590 Localized enlarged lymph nodes: Secondary | ICD-10-CM

## 2023-12-30 DIAGNOSIS — J849 Interstitial pulmonary disease, unspecified: Secondary | ICD-10-CM | POA: Diagnosis not present

## 2023-12-30 NOTE — Patient Instructions (Addendum)
 Hilar adenopathy & Mediastinal adenopathy Abnormal PET scan of mediastinum Nov 2022. 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  -PET scan November 2023 still shows hyperactive lymph nodes but stable in size x 1 year -CT Scan HRCT Sept 2025 reports this as reactive mediastinal adenopathy - your preference is continued monitpring  Plan -Do  CT scan of the chest without contrast [preferably high-resolution] in Sept 2026  ILD (interstitial lung disease) (HCC) -absent 2019 and seen first in 2021 and then February 2023 -> stable end of 2023/early 2024 - indeterminate for UIP July 2023CT  -Minimal to no symptoms as of  OCt 2025 visit -Continued lung function stability/improvement as of August 2024 compared August 2023 -Lung function normal as of summer 2024   Plan -Do high-resolution CT chest supine  in aug/sept 2026   Pulmonary emphysema  PFT normal as of aug 2024  Plan  - try spiriva  scheduled instead of symbicort  (Take samples of spiriva )  - if this works then stick with spiriva ; let us  know to send you prescripton  Lung CAncer screen. Quit smoking 2015.   Plan  - capture info in HRCT for ILD in Sept 2026   Followup Sept 2026 after PFT and CT; 15 min visit

## 2023-12-30 NOTE — Progress Notes (Signed)
 IOV Dr Brenna Dec 2022   This is a 73 year old female, past medical history of COPD, prior positive PPD.Present for evaluation of lung nodule.  Patient was enrolled in lung cancer screening program had an abnormal lung cancer screening CT on 01/12/2021.  Had mild associated mediastinal adenopathy she had multiple small but hypermetabolic mediastinal and hilar nodes.  Additionally a small right supraclavicular cervical and left axillary node.  Other small bilateral multiple pulmonary nodules.  Concerning for possible underlying granulomatous disease.  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 Vear a few years prior.  She has subsequently recovered from this.  She also had a recent URI and was treated with antibiotics and steroids.  This is getting better.   OV 03/13/2021: Here today for follow-up after bronchoscopy.  Patient was taken for bronchoscopy following nuclear medicine pet imaging which revealed hypermetabolic adenopathy within the chest.  Tissue sampling of the lymph nodes were all negative.  Flow cytometry also revealed no polyclonal or monoclonal proliferations of lymphocytes.  No evidence of lymphoma.  There was also no granulomatous disease noted.  We reviewed her pathology results today in the office which all suggest a reactive adenopathy.   OV 06/01/2021: Here today for follow-up after recent CT scan of the chest.  Thankfully lymphadenopathy is stable.  She otherwise feels great.  Leaving soon to go spend a couple of weeks and [place]  with family.  From respiratory standpoint she is able to get around do everything that she wants to do.  She was happy to receive the news.  Also discussed importance of repeat follow-up imaging with prominent adenopathy.  I do suspect she is probably dealing with sarcoidosis and we just did not make a diagnosis on last bronchoscopy.  As long as her lymph nodes are stable I think we can avoid repeat procedure versus even  consideration for mediastinoscopy.  We talked about this today in the office.   OV 09/22/2021: Here today for follow-up after repeat CT chest to follow lymphadenopathy and progressive fibrosis. Lymphadenopathy is unchanged from prior however overall shows progression of fibrosis from initial scans in 2022. She does report increased exertional dyspnea at this time and uses albuterol  inhaler twice daily now, was previously using this just once. She notes that between the heat which exacerbates her dyspnea and the recent increase in environmental trigger ([place] smoke) it has seemed worse. Overall clinical suspicion still remains high for sarcoidosis though we have not been able to confirm this at this point. We discussed a referral to the ILD clinic for further recommendations re: treating prophylactically with immune modulators, anti-fibrotic medications versus further work-up which she is in agreement with.       OV 10/15/2021 - transfe of care from Dr Brenna to DR Geronimo  Subjective:  Patient ID: Danielle Fox, female , DOB: 1950/08/16 , age 20 y.o. , MRN: [id number] , ADDRESS: [address] [place] [address] PCP Domenica Harlene LABOR, MD Patient Care Team: Domenica Harlene LABOR, MD as PCP - General (Family Medicine)  This Provider for this visit: Treatment Team:  Attending Provider: Geronimo Amel, MD    10/15/2021 -   Chief Complaint  Patient presents with   Consult    Pt had recent CT performed and is here today to have the results discussed. Pt denies any current complaints of cough, SOB, or chest discomfort.     HPI Danielle Fox 73 y.o. -reports that 2  years ago had encephalitis (reivew of chart suggest stroke). Prior smoker. [place] smoked heavily.  Says in after mtath of neuro issue CT was abnormal. Diffuse adenopathy esp mediastinal Nov 2022 - now non diagnostic early 2023. Consideration of sarcoid  On followup. Dr Brenna has  reocmmended followup PET. Had CT - shows ILD - in my view very mild and c/w indeterminate for UIP pattern. SErioogy negative. Has only mild DOE and somecough. Uses cymbicort. PFT shows obsstruction interestingly with BD response and normal DLCO . Cough is mild only. She has upcoming travel with family to [place] Sept 2023 and wants to hold off majore workup     CT chest 09/07/21  Narrative & Impression  CLINICAL DATA:  Abnormal CT chest. Evaluate for interstitial lung disease.   EXAM: CT CHEST WITHOUT CONTRAST   TECHNIQUE: Multidetector CT imaging of the chest was performed following the standard protocol without intravenous contrast. High resolution imaging of the lungs, as well as inspiratory and expiratory imaging, was performed.   RADIATION DOSE REDUCTION: This exam was performed according to the departmental dose-optimization program which includes automated exposure control, adjustment of the mA and/or kV according to patient size and/or use of iterative reconstruction technique.   COMPARISON:  04/20/2021, PET 01/27/2021, CT chest 01/12/2021 and 09/29/2019.   FINDINGS: Cardiovascular: Atherosclerotic calcification of the aorta. Heart size normal. No pericardial effusion.   Mediastinum/Nodes: Mediastinal adenopathy is again seen with an index low right paratracheal lymph node measuring 11 mm, as before. Hilar regions are difficult to evaluate without IV contrast. No axillary adenopathy. Esophagus is grossly unremarkable.   Lungs/Pleura: Biapical pleuroparenchymal scarring. Centrilobular and paraseptal emphysema. Patchy pulmonary parenchymal ground-glass is predominantly peripheral in location and without a definite craniocaudal gradient. A few scattered subpleural reticular densities are seen. Mild traction bronchiolectasis. Possible early honeycombing in the posterior right lower lobe (7/77), versus paraseptal emphysema. Findings are similar to 04/20/2021  but progressive from 01/12/2021. Millimetric pulmonary nodules, as before. No suspicious pulmonary nodules. Scattered mucoid impaction. No pleural fluid. Airway is unremarkable. No air trapping.   Upper Abdomen: Visualized portions of the liver, gallbladder, adrenal glands, kidneys, spleen, pancreas, stomach and bowel are grossly unremarkable. Upper abdominal lymph nodes are not enlarged by CT size criteria.   Musculoskeletal: Mild degenerative changes in the spine. Old left rib fracture. No worrisome lytic or sclerotic lesions.   IMPRESSION: 1. Pulmonary parenchymal pattern of fibrosis appears similar to 04/20/2021 but is progressive from 01/12/2021. Findings may be due to sarcoid. Nonspecific interstitial pneumonitis is considered less likely given progression. Findings are indeterminate for UIP per consensus guidelines: Diagnosis of Idiopathic Pulmonary Fibrosis: An Official ATS/ERS/JRS/ALAT Clinical Practice Guideline. Am JINNY [employer] Crit Care Med Vol 198, Iss 5, [phone], Oct 30 2016. 2. Mediastinal adenopathy, possibly due to sarcoid and previously evaluated on PET 01/27/2021. 3.  Aortic atherosclerosis (ICD10-I70.0). 4.  Emphysema (ICD10-J43.9).     Electronically Signed   By: Newell Eke M.D.   On: 09/07/2021 16:38    PFT  OV 03/16/2022  Subjective:  Patient ID: Danielle Fox, female , DOB: 1951/02/13 , age 50 y.o. , MRN: [id number] , ADDRESS: [address] [place] [address] PCP Domenica Harlene LABOR, MD Patient Care Team: Domenica Harlene LABOR, MD as PCP - General (Family Medicine)  This Provider for this visit: Treatment Team:  Attending Provider: Geronimo Amel, MD    03/16/2022 -   Chief Complaint  Patient presents with  Follow-up    PFT     HPI Danielle Fox 73 y.o. -     OV 10/25/2022  Subjective:  Patient ID: Danielle Fox, female , DOB: 02-02-1951 , age 45 y.o. , MRN: [id number] , ADDRESS: [address] [place]  [address] PCP Domenica Harlene LABOR, MD Patient Care Team: Domenica Harlene LABOR, MD as PCP - General (Family Medicine) Geronimo Amel, MD as Consulting Physician (Pulmonary Disease)  This Provider for this visit: Treatment Team:  Attending Provider: Geronimo Amel, MD    10/25/2022 -   Chief Complaint  Patient presents with   Follow-up    PFT done today. Breathing is overall doing well and she denies any new co's.      HPI Danielle Fox 73 y.o. -presents for her 6-9-month follow-up.  Last seen in January 2024.  She continues to do stable.  Shortness of breath is stable.  She had pulmonary function test and it shows stability/improvement.  In fact it is normal.  She did have some crackles on her chest.  Her last CT scan of the chest was in July 2023.  She is a former heavy smoker to 9 years since she quit and therefore she needs another CT scan of the chest.  She is not interested in lung biopsy patient not interested in antifibrotic's at this point in time.  Next year will be 10th anniversary since she quit smoking and she is planning to go make a visit to [place] she is beginning to think about this trip.  In terms of her other issues she has adnexal mass.  In the past she did not want to do follow-up.  She states most recently she saw her primary care physician.  Review of the records indicate she saw Dr. Glade Arm 7 9/24.  She has agreed to have a pelvic ultrasound.  There is a left adnexal cyst that is now at 9.9 cm and it is grown.  She states primary care is addressing the issue.    11/30/2022 Presents for acute office visit due to cough.  She was seen in the emergency room twice in September for URI and cough. Most recently seen on 11/26/2022 with reports of 3-week history of persistent cough.  Cough is dry.  She was treated with a course of prednisone  on 11/14/2022. Chest x-ray on 11/26/2022 showed pulmonary hyperinflation and diffuse bilateral interstitial pulmonary opacity, in  keeping with fibrotic interstitial lung disease better assessed on prior CT.  No new or focal airspace opacity. Antibiotic was deferred and patient was advised to follow with pulmonary. She is maintained on Symbicort , Zyrtec  and Nexium .  She has progressively gotten better since she had depo shot on 9/27, today is the best she has felt. Cough is mostly gone. She is is going to [place]  on Friday and does not want spend the whole vacation coughing. She is scheduled for HRCT tomorrow to follow-up on ILD findings.       OV 12/30/2023  Subjective:  Patient ID: Danielle Fox, female , DOB: 07-31-1950 , age 64 y.o. , MRN: [id number] , ADDRESS: [address] [place] [address] PCP Domenica Harlene LABOR, MD Patient Care Team: Domenica Harlene LABOR, MD as PCP - General (Family Medicine) Geronimo Amel, MD as Consulting Physician (Pulmonary Disease)  This Provider for this visit: Treatment Team:  Attending Provider: Geronimo Amel, MD    12/30/2023 -   Chief Complaint  Patient presents with   Medical Management of  Chronic Issues    Ct review     #Follow-up mediastinal adenopathy [PET active November 2022 nondiagnostic bronchoscopy January 2023 and considered reactive by Dr. Adine Gift early 2023.  Normal ACE level]   -She had an updated PET scan in November 2023.  Meters adenopathy is still hyperactive but there is no change in size.  #ILD:  -She went to [place] in September 2023.  She walked a lot.  She says she was not any more dyspneic than her sisters.  She barely has any dyspnea.  She feels stable.  We reviewed her latest pulmonary function test that shows stability.  Late last year she had CT scan of the chest that I personally visualized with her and agree with interpretation.  She has early/mild ILD.  She does not want to do any biopsy.  She does not want to start antifibrotic's.  Her current symptom scores are reflected below.  #Other abnormal findings on the PET  scan  -Adnexal mass present on the PET scan.  At the time I shared these results with the primary care office.  I reviewed the external records.  She is on Melissa Sullivn primary care nurse practitioner recently.  She says she does NOT want to address adnexal mass with an ultrasound.  HPI Danielle Fox 73 y.o. -  Galya M Corsica Franson is a 73 year old female with emphysema  and ILD who presents for follow-up of her pulmonary condition.  She reports no hospital admissions, emergency room visits, or urgent care visits related to respiratory issues over the past year. Approximately a year ago, she experienced a severe respiratory episode requiring two urgent care visits and a steroid injection for severe coughing. Since then, she has not experienced significant respiratory symptoms and reports no shortness of breath. She has been experiencing itching for about two weeks, located on her upper chest, forearm, and face. She describes the itching as 'horrible' and notes its persistence despite trying various treatments.  But from resp stand point she is nearly astymptomatic she says.   Her social history includes a past history of heavy smoking, having smoked a pack a day from age 83 for over forty years. She quit smoking eleven or twelve years ago.      SYMPTOM SCALE - ILD 03/16/2022 11/30/2022   Current weight     O2 use ra RA  Shortness of Breath 0 -> 5 scale with 5 being worst (score 6 If unable to do)   At rest 0 0  Simple tasks - showers, clothes change, eating, shaving 00 0  Household (dishes, doing bed, laundry) 0 0  Shopping 0 0  Walking level at own pace 0 0  Walking up Stairs 02 1  Total (30-36) Dyspnea Score 2 1  How bad is your cough? 2 2.5 (sept- 5)  How bad is your fatigue 0 0  How bad is nausea 0 0  How bad is vomiting?   0 0  How bad is diarrhea? 00 0  How bad is anxiety? 0 0  How bad is depression 0 0  Any chronic pain - if so where and how bad 0 0       Simple office walk 185 feet x  3 laps goal with forehead probe 10/15/2021    O2 used ra   Number laps completed 3   Comments about pace avg   Resting Pulse Ox/HR 100% and 77/min   Final Pulse Ox/HR 97% and 98/min  Desaturated </= 88% no   Desaturated <= 3% points yes   Got Tachycardic >/= 90/min yes   Symptoms at end of test No complaints   Miscellaneous comments x     CT Chest data from date: sept 2025  - personally visualized and independently interpreted : yes - my findings are: as below   IMPRESSION: Waxing and waning pulmonary nodules and consolidations most consistent with infectious/inflammatory process. Recommend follow-up to ensure stability/resolution of the pulmonary nodules.   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   Reactive mediastinal lymphadenopathy.   Moderate emphysematous changes.   Atherosclerotic calcifications of coronary arteries.     Electronically Signed   By: Megan  Zare M.D.   On: 11/17/2023 16:40 PFT     Latest Ref Rng & Units 10/25/2022   10:08 AM 03/16/2022   11:45 AM 10/14/2021   12:39 PM  PFT Results  FVC-Pre L 2.12  2.17    FVC-Predicted Pre % 84  85  79   FVC-Post L   2.17   FVC-Predicted Post %   85   Pre FEV1/FVC % % 67  65  61   Post FEV1/FCV % %   63   FEV1-Pre L 1.42  1.41  1.24   FEV1-Predicted Pre % 75  73  65   FEV1-Post L   1.37   DLCO uncorrected ml/min/mmHg 16.01  12.00  14.53   DLCO UNC% % 93  69  84   DLCO corrected ml/min/mmHg 15.68  11.79  14.53   DLCO COR %Predicted % 91  68  84   DLVA Predicted % 97  75  92   TLC L   4.76   TLC % Predicted %   105   RV % Predicted %   131        LAB RESULTS last 96 hours No results found.       has a past medical history of Allergy (Many years), Clostridioides difficile infection (10/19/2018), Colon polyps, Constipation (10/06/2014), COPD (chronic obstructive pulmonary  disease) (HCC), Osteopenia, Positive TB test, Preventative health care (06/29/2016), Pruritus (06/29/2016), Pyloric stenosis in adult (05/2019), and Welcome to Medicare preventive visit (06/29/2016).   reports that she quit smoking about 9 years ago. Her smoking use included cigarettes. She started smoking about 57 years ago. She has a 48 pack-year smoking history. She has never used smokeless tobacco.  Past Surgical History:  Procedure Laterality Date   ABDOMINAL HYSTERECTOMY     APPENDECTOMY     COLONOSCOPY     01-14-2005   DILATION AND CURETTAGE OF UTERUS     FINE NEEDLE ASPIRATION  02/27/2021   Procedure: FINE NEEDLE ASPIRATION (FNA) LINEAR;  Surgeon: Brenna Adine CROME, DO;  Location: [facility];  Service: Pulmonary;;   SINUS IRRIGATION  04/02/2015   TUBAL LIGATION     UPPER GASTROINTESTINAL ENDOSCOPY  11/22/2018   VIDEO BRONCHOSCOPY WITH ENDOBRONCHIAL ULTRASOUND Bilateral 02/27/2021   Procedure: VIDEO BRONCHOSCOPY WITH ENDOBRONCHIAL ULTRASOUND;  Surgeon: Brenna Adine CROME, DO;  Location: [facility];  Service: Pulmonary;  Laterality: Bilateral;    Allergies  Allergen Reactions   Doxycycline  Nausea And Vomiting   Erythromycin Base Nausea Only and Nausea And Vomiting    Other reaction(s): GI Upset (intolerance)   Vancomycin  Other (See Comments)    Nausea and abdominal pain    Immunization History  Administered Date(s) Administered   Fluad Quad(high Dose 65+) 01/29/2019, 12/28/2019, 12/02/2020, 01/13/2022  INFLUENZA, HIGH DOSE SEASONAL PF 11/07/2017   Influenza Split 01/07/2011   Influenza Whole 11/29/2009   Influenza,inj,Quad PF,6+ Mos 12/14/2012   Influenza-Unspecified 12/28/2013, 12/01/2015, 12/25/2016, 12/30/2021   PFIZER(Purple Top)SARS-COV-2 Vaccination 05/06/2019, 06/06/2019, 12/15/2019   Pneumococcal Conjugate-13 06/29/2016   Respiratory Syncytial Virus Vaccine,Recomb Aduvanted(Arexvy) 03/10/2022   Tdap 03/01/2001, 06/29/2016   Zoster Recombinant(Shingrix)  11/30/2017, 03/07/2018    Family History  Problem Relation Age of Onset   Heart disease Mother        cabg, MI   Alcohol abuse Father    Cirrhosis Father    Heart disease Father        MI   Colon cancer Neg Hx    Colon polyps Neg Hx    Esophageal cancer Neg Hx    Stomach cancer Neg Hx    Rectal cancer Neg Hx      Current Outpatient Medications:    albuterol  (VENTOLIN  HFA) 108 (90 Base) MCG/ACT inhaler, Inhale 1-2 puffs into the lungs every 6 (six) hours as needed for wheezing or shortness of breath., Disp: 8 g, Rfl: 2   budesonide -formoterol  (SYMBICORT ) 160-4.5 MCG/ACT inhaler, Inhale 2 puffs into the lungs in the morning and at bedtime., Disp: 10.2 g, Rfl: 3   cetirizine  (ZYRTEC  ALLERGY) 10 MG tablet, Take 1 tablet (10 mg total) by mouth daily., Disp: 30 tablet, Rfl: 0   esomeprazole  (NEXIUM ) 40 MG capsule, TAKE 1 CAPSULE (40 MG TOTAL) BY MOUTH 2 (TWO) TIMES DAILY BEFORE A MEAL., Disp: 60 capsule, Rfl: 5   meloxicam  (MOBIC ) 7.5 MG tablet, Take 1 tablet (7.5 mg total) by mouth daily as needed for pain., Disp: 30 tablet, Rfl: 1   Multiple Vitamins-Minerals (MULTIVITAMIN WITH MINERALS) tablet, Take 1 tablet by mouth daily., Disp: , Rfl:       Objective:   Vitals:   12/30/23 0903  BP: (!) 83/60  Pulse: 69  Temp: 98.7 F (37.1 C)  TempSrc: Oral  SpO2: 97%  Weight: 122 lb 9.6 oz (55.6 kg)  Height: 5' 1 (1.549 m)    Estimated body mass index is 23.17 kg/m as calculated from the following:   Height as of this encounter: 5' 1 (1.549 m).   Weight as of this encounter: 122 lb 9.6 oz (55.6 kg).  @WEIGHTCHANGE @  Filed Weights   12/30/23 0903  Weight: 122 lb 9.6 oz (55.6 kg)     Physical Exam   General: No distress. Looks well O2 at rest: no Cane present: no Sitting in wheel chair: no Frail: no Obese: no Neuro: Alert and Oriented x 3. GCS 15. Speech normal Psych: Pleasant Resp:  Barrel Chest - no.  Wheeze - no, Crackles - no, No overt respiratory distress CVS:  Normal heart sounds. Murmurs - no Ext: Stigmata of Connective Tissue Disease - no HEENT: Normal upper airway. PEERL +. No post nasal drip        Assessment/     Assessment & Plan ILD (interstitial lung disease) (HCC)  Chronic obstructive pulmonary disease with emphysema, unspecified emphysema type (HCC)  Stopped smoking with greater than 40 pack year history  Mediastinal adenopathy    PLAN Patient Instructions  Hilar adenopathy & Mediastinal adenopathy Abnormal PET scan of mediastinum Nov 2022. 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  -PET scan November 2023 still shows hyperactive lymph nodes but stable in size x 1 year -CT Scan HRCT Sept 2025 reports this as reactive mediastinal adenopathy - your preference is continued  monitpring  Plan -Do  CT scan of the chest without contrast [preferably high-resolution] in Sept 2026  ILD (interstitial lung disease) (HCC) -absent 2019 and seen first in 2021 and then February 2023 -> stable end of 2023/early 2024 - indeterminate for UIP July 2023CT  -Minimal to no symptoms as of  OCt 2025 visit -Continued lung function stability/improvement as of August 2024 compared August 2023 -Lung function normal as of summer 2024   Plan -Do high-resolution CT chest supine  in aug/sept 2026   Pulmonary emphysema  PFT normal as of aug 2024  Plan  - try spiriva  scheduled instead of symbicort  (Take samples of spiriva )  - if this works then stick with spiriva ; let us  know to send you prescripton  Lung CAncer screen. Quit smoking 2015.   Plan  - capture info in HRCT for ILD in Sept 2026   Followup Sept 2026 after PFT and CT; 15 min visit    FOLLOWUP    Return for Sept 2026 after PFT and CT; 15 min visit.    SIGNATURE    Dr. Dorethia Cave, M.D., F.C.C.P,  Pulmonary and Critical Care Medicine Staff Physician, [facility] Director - Interstitial  Lung Disease  Program  Pulmonary Fibrosis [facility] at [facility] [place], [place], [address]  Pager: [phone], If no answer or between  15:00h - 7:00h: call [phone] Telephone: [phone]  10:07 AM 12/30/2023

## 2024-01-04 DIAGNOSIS — L309 Dermatitis, unspecified: Secondary | ICD-10-CM | POA: Diagnosis not present

## 2024-01-04 DIAGNOSIS — L299 Pruritus, unspecified: Secondary | ICD-10-CM | POA: Diagnosis not present

## 2024-01-10 ENCOUNTER — Ambulatory Visit (HOSPITAL_BASED_OUTPATIENT_CLINIC_OR_DEPARTMENT_OTHER)

## 2024-01-10 ENCOUNTER — Ambulatory Visit (HOSPITAL_BASED_OUTPATIENT_CLINIC_OR_DEPARTMENT_OTHER)
Admission: RE | Admit: 2024-01-10 | Discharge: 2024-01-10 | Disposition: A | Discharge status: Home / Self Care | Source: Ambulatory Visit | Attending: Family Medicine | Admitting: Family Medicine

## 2024-01-10 ENCOUNTER — Other Ambulatory Visit (HOSPITAL_BASED_OUTPATIENT_CLINIC_OR_DEPARTMENT_OTHER)

## 2024-01-10 ENCOUNTER — Encounter (HOSPITAL_BASED_OUTPATIENT_CLINIC_OR_DEPARTMENT_OTHER): Payer: Self-pay

## 2024-01-10 DIAGNOSIS — Z780 Asymptomatic menopausal state: Secondary | ICD-10-CM | POA: Diagnosis not present

## 2024-01-10 DIAGNOSIS — M8589 Other specified disorders of bone density and structure, multiple sites: Secondary | ICD-10-CM | POA: Diagnosis not present

## 2024-01-10 DIAGNOSIS — Z1231 Encounter for screening mammogram for malignant neoplasm of breast: Secondary | ICD-10-CM | POA: Insufficient documentation

## 2024-01-10 DIAGNOSIS — M8580 Other specified disorders of bone density and structure, unspecified site: Secondary | ICD-10-CM | POA: Insufficient documentation

## 2024-01-11 ENCOUNTER — Ambulatory Visit: Payer: Self-pay | Admitting: Family Medicine

## 2024-01-13 NOTE — Progress Notes (Signed)
 Patient reviewed via MyChart.

## 2024-01-24 ENCOUNTER — Other Ambulatory Visit: Payer: Self-pay | Admitting: Family Medicine

## 2024-03-07 ENCOUNTER — Other Ambulatory Visit: Payer: Self-pay

## 2024-03-07 MED ORDER — BUDESONIDE-FORMOTEROL FUMARATE 160-4.5 MCG/ACT IN AERO: 2.0000 | INHALATION_SPRAY | Freq: Two times a day (BID) | RESPIRATORY_TRACT | 1 refills | Status: AC

## 2024-03-07 MED ORDER — ESOMEPRAZOLE MAGNESIUM 40 MG PO CPDR: 40.0000 mg | DELAYED_RELEASE_CAPSULE | Freq: Two times a day (BID) | ORAL | 1 refills | Status: DC

## 2024-03-07 NOTE — Telephone Encounter (Signed)
 Generic Nexium  refill sent in to [facility] as fax requested. Up to date on office visits.

## 2024-03-08 ENCOUNTER — Other Ambulatory Visit: Payer: Self-pay

## 2024-03-08 DIAGNOSIS — J441 Chronic obstructive pulmonary disease with (acute) exacerbation: Secondary | ICD-10-CM

## 2024-03-08 MED ORDER — ALBUTEROL SULFATE HFA 108 (90 BASE) MCG/ACT IN AERS: 1.0000 | INHALATION_SPRAY | Freq: Four times a day (QID) | RESPIRATORY_TRACT | 1 refills | Status: AC | PRN

## 2024-03-14 ENCOUNTER — Other Ambulatory Visit: Payer: Self-pay | Admitting: Podiatry

## 2024-03-14 MED ORDER — MELOXICAM 7.5 MG PO TABS: 7.5000 mg | ORAL_TABLET | Freq: Every day | ORAL | 1 refills | Status: AC | PRN

## 2024-03-14 NOTE — Progress Notes (Signed)
 Refill requested from pharmacy.

## 2024-03-19 ENCOUNTER — Telehealth: Payer: Self-pay

## 2024-03-19 ENCOUNTER — Telehealth: Payer: Self-pay | Admitting: Internal Medicine

## 2024-03-19 MED ORDER — ESOMEPRAZOLE MAGNESIUM 40 MG PO CPDR: 40.0000 mg | DELAYED_RELEASE_CAPSULE | Freq: Two times a day (BID) | ORAL | 3 refills | Status: AC

## 2024-03-19 NOTE — Telephone Encounter (Signed)
 Pt was advised that Dr.Gessner send medication in already and she will have to contact the office to get the pharmacy changed.

## 2024-03-19 NOTE — Telephone Encounter (Signed)
 Sent in Nexium  as requested to [facility], 90 day  Called patient and informed her

## 2024-03-19 NOTE — Telephone Encounter (Signed)
 Copied from CRM #[id number]. Topic: Clinical - Medication Refill >> Mar 19, 2024  9:25 AM Mesmerise C wrote: Medication: esomeprazole  (NEXIUM ) 40 MG capsule  Has the patient contacted their pharmacy? Yes (Agent: If no, request that the patient contact the pharmacy for the refill. If patient does not wish to contact the pharmacy document the reason why and proceed with request.) (Agent: If yes, when and what did the pharmacy advise?)  This is the patient's preferred pharmacy:  [id number] - [place], [place] - [address] [place] [address] Phone: [phone] Fax: [phone]  Is this the correct pharmacy for this prescription? Yes If no, delete pharmacy and type the correct one.   Has the prescription been filled recently? No  Is the patient out of the medication? No  Has the patient been seen for an appointment in the last year OR does the patient have an upcoming appointment? Yes  Can we respond through MyChart? Yes  Agent: Please be advised that Rx refills may take up to 3 business days. We ask that you follow-up with your pharmacy.

## 2024-03-19 NOTE — Telephone Encounter (Signed)
 Patient called stating she is having difficulty refilling Nexium . Requesting for refill to be sent to [facility] on [address]. Please advise, thank you

## 2024-05-22 ENCOUNTER — Encounter: Admitting: Family Medicine

## 2024-10-08 ENCOUNTER — Encounter: Admitting: Family Medicine

## 2024-11-19 ENCOUNTER — Encounter: Admitting: Family Medicine

## 2024-11-22 ENCOUNTER — Encounter: Admitting: Family Medicine
